# Patient Record
Sex: Male | Born: 1945
Health system: Southern US, Community
[De-identification: ages and names within clinical notes are randomized; demographics above are authoritative.]

## PROBLEM LIST (undated history)

## (undated) DIAGNOSIS — E785 Hyperlipidemia, unspecified: Secondary | ICD-10-CM

## (undated) DIAGNOSIS — D649 Anemia, unspecified: Secondary | ICD-10-CM

## (undated) DIAGNOSIS — R001 Bradycardia, unspecified: Secondary | ICD-10-CM

## (undated) DIAGNOSIS — M545 Low back pain, unspecified: Secondary | ICD-10-CM

## (undated) DIAGNOSIS — I639 Cerebral infarction, unspecified: Secondary | ICD-10-CM

## (undated) DIAGNOSIS — M999 Biomechanical lesion, unspecified: Secondary | ICD-10-CM

## (undated) DIAGNOSIS — E039 Hypothyroidism, unspecified: Secondary | ICD-10-CM

## (undated) DIAGNOSIS — T7840XA Allergy, unspecified, initial encounter: Secondary | ICD-10-CM

## (undated) DIAGNOSIS — K222 Esophageal obstruction: Secondary | ICD-10-CM

## (undated) DIAGNOSIS — I2510 Atherosclerotic heart disease of native coronary artery without angina pectoris: Secondary | ICD-10-CM

## (undated) DIAGNOSIS — R011 Cardiac murmur, unspecified: Secondary | ICD-10-CM

## (undated) DIAGNOSIS — M1990 Unspecified osteoarthritis, unspecified site: Secondary | ICD-10-CM

## (undated) DIAGNOSIS — Q2112 Patent foramen ovale: Secondary | ICD-10-CM

## (undated) DIAGNOSIS — Q211 Atrial septal defect: Secondary | ICD-10-CM

## (undated) DIAGNOSIS — I208 Other forms of angina pectoris: Secondary | ICD-10-CM

## (undated) DIAGNOSIS — Z9289 Personal history of other medical treatment: Secondary | ICD-10-CM

## (undated) DIAGNOSIS — J42 Unspecified chronic bronchitis: Secondary | ICD-10-CM

## (undated) DIAGNOSIS — G8929 Other chronic pain: Secondary | ICD-10-CM

## (undated) HISTORY — DX: Patent foramen ovale: Q21.12

## (undated) HISTORY — PX: TONSILLECTOMY: SUR1361

## (undated) HISTORY — DX: Bradycardia, unspecified: R00.1

## (undated) HISTORY — DX: Atrial septal defect: Q21.1

## (undated) HISTORY — DX: Cerebral infarction, unspecified: I63.9

## (undated) HISTORY — DX: Other forms of angina pectoris: I20.8

## (undated) HISTORY — DX: Personal history of other medical treatment: Z92.89

## (undated) HISTORY — DX: Hyperlipidemia, unspecified: E78.5

## (undated) HISTORY — DX: Atherosclerotic heart disease of native coronary artery without angina pectoris: I25.10

## (undated) HISTORY — DX: Allergy, unspecified, initial encounter: T78.40XA

## (undated) HISTORY — DX: Biomechanical lesion, unspecified: M99.9

## (undated) HISTORY — DX: Unspecified osteoarthritis, unspecified site: M19.90

---
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
## 2001-04-10 ENCOUNTER — Encounter (INDEPENDENT_AMBULATORY_CARE_PROVIDER_SITE_OTHER): Payer: Self-pay | Admitting: Specialist

## 2001-04-10 ENCOUNTER — Other Ambulatory Visit: Admission: RE | Admit: 2001-04-10 | Discharge: 2001-04-10 | Payer: Self-pay | Admitting: Internal Medicine

## 2004-12-06 ENCOUNTER — Ambulatory Visit: Payer: Self-pay | Admitting: Internal Medicine

## 2004-12-14 ENCOUNTER — Ambulatory Visit: Payer: Self-pay | Admitting: Internal Medicine

## 2005-02-20 ENCOUNTER — Ambulatory Visit: Payer: Self-pay | Admitting: Internal Medicine

## 2005-03-13 ENCOUNTER — Ambulatory Visit: Payer: Self-pay | Admitting: Internal Medicine

## 2005-10-05 ENCOUNTER — Ambulatory Visit: Payer: Self-pay | Admitting: Internal Medicine

## 2006-01-11 ENCOUNTER — Ambulatory Visit: Payer: Self-pay | Admitting: Internal Medicine

## 2006-01-21 ENCOUNTER — Ambulatory Visit: Payer: Self-pay | Admitting: Internal Medicine

## 2006-03-29 ENCOUNTER — Ambulatory Visit: Payer: Self-pay | Admitting: Internal Medicine

## 2006-04-04 ENCOUNTER — Ambulatory Visit: Payer: Self-pay | Admitting: Internal Medicine

## 2006-05-10 ENCOUNTER — Ambulatory Visit: Payer: Self-pay | Admitting: Internal Medicine

## 2006-07-30 ENCOUNTER — Ambulatory Visit: Payer: Self-pay | Admitting: Internal Medicine

## 2006-07-30 LAB — CONVERTED CEMR LAB
ALT: 23 U/L
AST: 26 U/L
Albumin: 3.6 g/dL
Alkaline Phosphatase: 44 U/L
BUN: 15 mg/dL
CO2: 28 meq/L
Calcium: 9.2 mg/dL
Chloride: 105 meq/L
Chol/HDL Ratio, serum: 5.5
Cholesterol: 255 mg/dL
Creatinine, Ser: 1.1 mg/dL
Free T4: 0.8 ng/dL — ABNORMAL LOW
GFR calc non Af Amer: 73 mL/min
Glomerular Filtration Rate, Af Am: 88 mL/min/{1.73_m2}
Glucose, Bld: 105 mg/dL — ABNORMAL HIGH
HDL: 46.1 mg/dL
LDL DIRECT: 120.3 mg/dL
Potassium: 3.9 meq/L
Sodium: 138 meq/L
TSH: 3.16 u[IU]/mL
Total Bilirubin: 0.9 mg/dL
Total Protein: 6.5 g/dL
Triglyceride fasting, serum: 228 mg/dL
VLDL: 46 mg/dL — ABNORMAL HIGH

## 2006-08-22 ENCOUNTER — Ambulatory Visit: Payer: Self-pay | Admitting: Internal Medicine

## 2006-12-25 ENCOUNTER — Ambulatory Visit: Payer: Self-pay | Admitting: Internal Medicine

## 2006-12-25 LAB — CONVERTED CEMR LAB
ALT: 21 units/L (ref 0–40)
AST: 22 units/L (ref 0–37)
Albumin: 3.7 g/dL (ref 3.5–5.2)
Alkaline Phosphatase: 46 units/L (ref 39–117)
BUN: 18 mg/dL (ref 6–23)
Bilirubin, Direct: 0.1 mg/dL (ref 0.0–0.3)
CO2: 30 meq/L (ref 19–32)
Calcium: 9.4 mg/dL (ref 8.4–10.5)
Chloride: 108 meq/L (ref 96–112)
Cholesterol: 242 mg/dL (ref 0–200)
Creatinine, Ser: 1 mg/dL (ref 0.4–1.5)
Direct LDL: 103 mg/dL
GFR calc Af Amer: 98 mL/min
GFR calc non Af Amer: 81 mL/min
Glucose, Bld: 109 mg/dL — ABNORMAL HIGH (ref 70–99)
HDL: 45.2 mg/dL (ref >39.0)
Potassium: 4.1 meq/L (ref 3.5–5.1)
Sodium: 141 meq/L (ref 135–145)
Total Bilirubin: 0.9 mg/dL (ref 0.3–1.2)
Total CHOL/HDL Ratio: 5.4
Total Protein: 6.6 g/dL (ref 6.0–8.3)
Triglycerides: 376 mg/dL (ref 0–149)
VLDL: 75 mg/dL — ABNORMAL HIGH (ref 0–40)

## 2006-12-26 ENCOUNTER — Ambulatory Visit: Payer: Self-pay | Admitting: Internal Medicine

## 2007-02-18 ENCOUNTER — Ambulatory Visit: Payer: Self-pay | Admitting: Internal Medicine

## 2007-03-31 ENCOUNTER — Ambulatory Visit: Payer: Self-pay | Admitting: Internal Medicine

## 2007-06-09 ENCOUNTER — Ambulatory Visit: Payer: Self-pay | Admitting: Internal Medicine

## 2007-07-07 ENCOUNTER — Ambulatory Visit: Payer: Self-pay | Admitting: Internal Medicine

## 2007-07-07 LAB — CONVERTED CEMR LAB
ALT: 21 units/L (ref 0–53)
AST: 22 units/L (ref 0–37)
Albumin: 3.8 g/dL (ref 3.5–5.2)
Alkaline Phosphatase: 36 units/L — ABNORMAL LOW (ref 39–117)
BUN: 18 mg/dL (ref 6–23)
Basophils Absolute: 0 10*3/uL (ref 0.0–0.1)
Basophils Relative: 0.6 % (ref 0.0–1.0)
Bilirubin, Direct: 0.1 mg/dL (ref 0.0–0.3)
CO2: 29 meq/L (ref 19–32)
Calcium: 9.1 mg/dL (ref 8.4–10.5)
Chloride: 109 meq/L (ref 96–112)
Cholesterol: 222 mg/dL (ref 0–200)
Creatinine, Ser: 0.9 mg/dL (ref 0.4–1.5)
Direct LDL: 122.9 mg/dL
Eosinophils Absolute: 0.1 10*3/uL (ref 0.0–0.6)
Eosinophils Relative: 2.6 % (ref 0.0–5.0)
GFR calc Af Amer: 110 mL/min
GFR calc non Af Amer: 91 mL/min
Glucose, Bld: 111 mg/dL — ABNORMAL HIGH (ref 70–99)
HCT: 35.9 % — ABNORMAL LOW (ref 39.0–52.0)
HDL: 43.6 mg/dL (ref >39.0)
Hemoglobin: 12.4 g/dL — ABNORMAL LOW (ref 13.0–17.0)
Hgb A1c MFr Bld: 5.6 % (ref 4.6–6.0)
Lymphocytes Relative: 32.1 % (ref 12.0–46.0)
MCHC: 34.5 g/dL (ref 30.0–36.0)
MCV: 96.2 fL (ref 78.0–100.0)
Monocytes Absolute: 0.4 10*3/uL (ref 0.2–0.7)
Monocytes Relative: 10 % (ref 3.0–11.0)
Neutro Abs: 2.4 10*3/uL (ref 1.4–7.7)
Neutrophils Relative %: 54.7 % (ref 43.0–77.0)
PSA: 3.13 ng/mL (ref 0.10–4.00)
Platelets: 180 10*3/uL (ref 150–400)
Potassium: 4.1 meq/L (ref 3.5–5.1)
RBC: 3.74 M/uL — ABNORMAL LOW (ref 4.22–5.81)
RDW: 12.3 % (ref 11.5–14.6)
Sodium: 141 meq/L (ref 135–145)
TSH: 1.75 microintl units/mL (ref 0.35–5.50)
Total Bilirubin: 0.7 mg/dL (ref 0.3–1.2)
Total CHOL/HDL Ratio: 5.1
Total Protein: 6.5 g/dL (ref 6.0–8.3)
Triglycerides: 181 mg/dL — ABNORMAL HIGH (ref 0–149)
VLDL: 36 mg/dL (ref 0–40)
Vit D, 1,25-Dihydroxy: 29 — ABNORMAL LOW (ref 30–89)
WBC: 4.3 10*3/uL — ABNORMAL LOW (ref 4.5–10.5)

## 2007-07-10 ENCOUNTER — Encounter: Payer: Self-pay | Admitting: Internal Medicine

## 2007-07-10 ENCOUNTER — Ambulatory Visit: Payer: Self-pay | Admitting: Internal Medicine

## 2007-07-10 DIAGNOSIS — E785 Hyperlipidemia, unspecified: Secondary | ICD-10-CM | POA: Insufficient documentation

## 2007-07-10 DIAGNOSIS — E039 Hypothyroidism, unspecified: Secondary | ICD-10-CM | POA: Insufficient documentation

## 2007-09-25 ENCOUNTER — Ambulatory Visit: Payer: Self-pay | Admitting: Internal Medicine

## 2007-09-25 DIAGNOSIS — J069 Acute upper respiratory infection, unspecified: Secondary | ICD-10-CM | POA: Insufficient documentation

## 2007-10-08 ENCOUNTER — Telehealth: Payer: Self-pay | Admitting: Internal Medicine

## 2007-11-04 ENCOUNTER — Ambulatory Visit: Payer: Self-pay | Admitting: Internal Medicine

## 2007-11-04 DIAGNOSIS — R7309 Other abnormal glucose: Secondary | ICD-10-CM | POA: Insufficient documentation

## 2007-11-04 DIAGNOSIS — E038 Other specified hypothyroidism: Secondary | ICD-10-CM | POA: Insufficient documentation

## 2007-11-05 LAB — CONVERTED CEMR LAB
ALT: 21 units/L (ref 0–53)
AST: 23 units/L (ref 0–37)
Albumin: 3.8 g/dL (ref 3.5–5.2)
Alkaline Phosphatase: 41 units/L (ref 39–117)
BUN: 22 mg/dL (ref 6–23)
Bilirubin, Direct: 0.1 mg/dL (ref 0.0–0.3)
CO2: 30 meq/L (ref 19–32)
Calcium: 9.3 mg/dL (ref 8.4–10.5)
Chloride: 105 meq/L (ref 96–112)
Creatinine, Ser: 1 mg/dL (ref 0.4–1.5)
GFR calc Af Amer: 98 mL/min
GFR calc non Af Amer: 81 mL/min
Glucose, Bld: 108 mg/dL — ABNORMAL HIGH (ref 70–99)
Hgb A1c MFr Bld: 5.9 % (ref 4.6–6.0)
PSA: 2.58 ng/mL (ref 0.10–4.00)
Potassium: 4.2 meq/L (ref 3.5–5.1)
Sodium: 140 meq/L (ref 135–145)
TSH: 2.45 microintl units/mL (ref 0.35–5.50)
Total Bilirubin: 0.9 mg/dL (ref 0.3–1.2)
Total Protein: 6.4 g/dL (ref 6.0–8.3)

## 2007-11-10 ENCOUNTER — Ambulatory Visit: Payer: Self-pay | Admitting: Internal Medicine

## 2008-04-02 ENCOUNTER — Ambulatory Visit: Payer: Self-pay | Admitting: Internal Medicine

## 2008-04-02 LAB — CONVERTED CEMR LAB
BUN: 20 mg/dL (ref 6–23)
CO2: 28 meq/L (ref 19–32)
Calcium: 9.1 mg/dL (ref 8.4–10.5)
Chloride: 109 meq/L (ref 96–112)
Cholesterol: 232 mg/dL (ref 0–200)
Creatinine, Ser: 1 mg/dL (ref 0.4–1.5)
Direct LDL: 127.5 mg/dL
GFR calc Af Amer: 98 mL/min
GFR calc non Af Amer: 81 mL/min
Glucose, Bld: 101 mg/dL — ABNORMAL HIGH (ref 70–99)
HDL: 40.9 mg/dL (ref >39.0)
Hgb A1c MFr Bld: 5.8 % (ref 4.6–6.0)
Potassium: 4.3 meq/L (ref 3.5–5.1)
Sodium: 141 meq/L (ref 135–145)
TSH: 2.58 microintl units/mL (ref 0.35–5.50)
Total CHOL/HDL Ratio: 5.7
Triglycerides: 230 mg/dL (ref 0–149)
VLDL: 46 mg/dL — ABNORMAL HIGH (ref 0–40)

## 2008-04-08 ENCOUNTER — Ambulatory Visit: Payer: Self-pay | Admitting: Internal Medicine

## 2008-08-09 ENCOUNTER — Telehealth: Payer: Self-pay | Admitting: Internal Medicine

## 2008-10-05 ENCOUNTER — Ambulatory Visit: Payer: Self-pay | Admitting: Internal Medicine

## 2008-10-05 LAB — CONVERTED CEMR LAB
ALT: 27 units/L (ref 0–53)
AST: 23 units/L (ref 0–37)
Albumin: 3.7 g/dL (ref 3.5–5.2)
Alkaline Phosphatase: 43 units/L (ref 39–117)
BUN: 17 mg/dL (ref 6–23)
Basophils Absolute: 0 10*3/uL (ref 0.0–0.1)
Basophils Relative: 0.3 % (ref 0.0–3.0)
Bilirubin Urine: NEGATIVE
Bilirubin, Direct: 0.1 mg/dL (ref 0.0–0.3)
CO2: 29 meq/L (ref 19–32)
Calcium: 9 mg/dL (ref 8.4–10.5)
Chloride: 107 meq/L (ref 96–112)
Cholesterol: 248 mg/dL (ref 0–200)
Creatinine, Ser: 1 mg/dL (ref 0.4–1.5)
Direct LDL: 162.2 mg/dL
Eosinophils Absolute: 0.1 10*3/uL (ref 0.0–0.7)
Eosinophils Relative: 2.5 % (ref 0.0–5.0)
GFR calc Af Amer: 97 mL/min
GFR calc non Af Amer: 80 mL/min
Glucose, Bld: 101 mg/dL — ABNORMAL HIGH (ref 70–99)
HCT: 37.7 % — ABNORMAL LOW (ref 39.0–52.0)
HDL: 51.3 mg/dL (ref >39.0)
Hemoglobin, Urine: NEGATIVE
Hemoglobin: 12.9 g/dL — ABNORMAL LOW (ref 13.0–17.0)
Ketones, ur: NEGATIVE mg/dL
Leukocytes, UA: NEGATIVE
Lymphocytes Relative: 23.4 % (ref 12.0–46.0)
MCHC: 34.2 g/dL (ref 30.0–36.0)
MCV: 97.1 fL (ref 78.0–100.0)
Monocytes Absolute: 0.5 10*3/uL (ref 0.1–1.0)
Monocytes Relative: 12.8 % — ABNORMAL HIGH (ref 3.0–12.0)
Neutro Abs: 2.6 10*3/uL (ref 1.4–7.7)
Neutrophils Relative %: 61 % (ref 43.0–77.0)
Nitrite: NEGATIVE
PSA: 2.61 ng/mL (ref 0.10–4.00)
Platelets: 144 10*3/uL — ABNORMAL LOW (ref 150–400)
Potassium: 4.2 meq/L (ref 3.5–5.1)
RBC: 3.88 M/uL — ABNORMAL LOW (ref 4.22–5.81)
RDW: 11.7 % (ref 11.5–14.6)
Sodium: 140 meq/L (ref 135–145)
Specific Gravity, Urine: 1.015 (ref 1.000–1.03)
TSH: 4.16 microintl units/mL (ref 0.35–5.50)
Total Bilirubin: 0.8 mg/dL (ref 0.3–1.2)
Total CHOL/HDL Ratio: 4.8
Total Protein, Urine: NEGATIVE mg/dL
Total Protein: 6.3 g/dL (ref 6.0–8.3)
Triglycerides: 96 mg/dL (ref 0–149)
Urine Glucose: NEGATIVE mg/dL
Urobilinogen, UA: 0.2 (ref 0.0–1.0)
VLDL: 19 mg/dL (ref 0–40)
WBC: 4.2 10*3/uL — ABNORMAL LOW (ref 4.5–10.5)
pH: 6 (ref 5.0–8.0)

## 2008-10-07 ENCOUNTER — Ambulatory Visit: Payer: Self-pay | Admitting: Internal Medicine

## 2008-10-07 ENCOUNTER — Telehealth: Payer: Self-pay | Admitting: Internal Medicine

## 2008-10-07 DIAGNOSIS — R799 Abnormal finding of blood chemistry, unspecified: Secondary | ICD-10-CM | POA: Insufficient documentation

## 2008-10-07 DIAGNOSIS — R21 Rash and other nonspecific skin eruption: Secondary | ICD-10-CM | POA: Insufficient documentation

## 2008-10-15 ENCOUNTER — Ambulatory Visit: Payer: Self-pay | Admitting: Internal Medicine

## 2008-10-15 DIAGNOSIS — R059 Cough, unspecified: Secondary | ICD-10-CM | POA: Insufficient documentation

## 2008-10-15 DIAGNOSIS — R05 Cough: Secondary | ICD-10-CM

## 2008-10-15 DIAGNOSIS — R053 Chronic cough: Secondary | ICD-10-CM | POA: Insufficient documentation

## 2008-10-15 DIAGNOSIS — J209 Acute bronchitis, unspecified: Secondary | ICD-10-CM | POA: Insufficient documentation

## 2008-12-20 ENCOUNTER — Ambulatory Visit: Payer: Self-pay | Admitting: Internal Medicine

## 2008-12-20 LAB — CONVERTED CEMR LAB
ALT: 29 units/L (ref 0–53)
AST: 33 units/L (ref 0–37)
Albumin: 3.9 g/dL (ref 3.5–5.2)
Alkaline Phosphatase: 40 units/L (ref 39–117)
BUN: 20 mg/dL (ref 6–23)
Bilirubin, Direct: 0.1 mg/dL (ref 0.0–0.3)
CO2: 29 meq/L (ref 19–32)
Calcium: 8.8 mg/dL (ref 8.4–10.5)
Chloride: 108 meq/L (ref 96–112)
Creatinine, Ser: 1 mg/dL (ref 0.4–1.5)
GFR calc non Af Amer: 80.29 mL/min (ref >60)
Glucose, Bld: 95 mg/dL (ref 70–99)
Potassium: 4.1 meq/L (ref 3.5–5.1)
Sodium: 141 meq/L (ref 135–145)
TSH: 2.55 microintl units/mL (ref 0.35–5.50)
Total Bilirubin: 0.8 mg/dL (ref 0.3–1.2)
Total Protein: 6.2 g/dL (ref 6.0–8.3)

## 2008-12-23 ENCOUNTER — Ambulatory Visit: Payer: Self-pay | Admitting: Internal Medicine

## 2008-12-23 DIAGNOSIS — M79609 Pain in unspecified limb: Secondary | ICD-10-CM | POA: Insufficient documentation

## 2008-12-23 DIAGNOSIS — R5383 Other fatigue: Secondary | ICD-10-CM | POA: Insufficient documentation

## 2009-06-22 ENCOUNTER — Ambulatory Visit: Payer: Self-pay | Admitting: Internal Medicine

## 2009-06-22 LAB — CONVERTED CEMR LAB
ALT: 22 units/L (ref 0–53)
AST: 24 units/L (ref 0–37)
Albumin: 4 g/dL (ref 3.5–5.2)
Alkaline Phosphatase: 41 units/L (ref 39–117)
BUN: 21 mg/dL (ref 6–23)
Basophils Absolute: 0 10*3/uL (ref 0.0–0.1)
Basophils Relative: 0.8 % (ref 0.0–3.0)
Bilirubin, Direct: 0.1 mg/dL (ref 0.0–0.3)
CO2: 29 meq/L (ref 19–32)
Calcium: 9.4 mg/dL (ref 8.4–10.5)
Chloride: 107 meq/L (ref 96–112)
Cholesterol: 262 mg/dL — ABNORMAL HIGH (ref 0–200)
Creatinine, Ser: 1.1 mg/dL (ref 0.4–1.5)
Direct LDL: 168.5 mg/dL
Eosinophils Absolute: 0.1 10*3/uL (ref 0.0–0.7)
Eosinophils Relative: 2.2 % (ref 0.0–5.0)
GFR calc non Af Amer: 71.81 mL/min (ref >60)
Glucose, Bld: 92 mg/dL (ref 70–99)
HCT: 38.9 % — ABNORMAL LOW (ref 39.0–52.0)
HDL: 46.1 mg/dL (ref >39.00)
Hemoglobin: 13.2 g/dL (ref 13.0–17.0)
Lymphocytes Relative: 34.6 % (ref 12.0–46.0)
Lymphs Abs: 1.6 10*3/uL (ref 0.7–4.0)
MCHC: 33.8 g/dL (ref 30.0–36.0)
MCV: 97.8 fL (ref 78.0–100.0)
Monocytes Absolute: 0.5 10*3/uL (ref 0.1–1.0)
Monocytes Relative: 10 % (ref 3.0–12.0)
Neutro Abs: 2.4 10*3/uL (ref 1.4–7.7)
Neutrophils Relative %: 52.4 % (ref 43.0–77.0)
Platelets: 165 10*3/uL (ref 150.0–400.0)
Potassium: 4.2 meq/L (ref 3.5–5.1)
RBC: 3.98 M/uL — ABNORMAL LOW (ref 4.22–5.81)
RDW: 11.9 % (ref 11.5–14.6)
Sodium: 140 meq/L (ref 135–145)
TSH: 2.24 microintl units/mL (ref 0.35–5.50)
Total Bilirubin: 1.1 mg/dL (ref 0.3–1.2)
Total CHOL/HDL Ratio: 6
Total Protein: 6.8 g/dL (ref 6.0–8.3)
Triglycerides: 163 mg/dL — ABNORMAL HIGH (ref 0.0–149.0)
VLDL: 32.6 mg/dL (ref 0.0–40.0)
WBC: 4.6 10*3/uL (ref 4.5–10.5)

## 2009-06-27 ENCOUNTER — Encounter: Payer: Self-pay | Admitting: Internal Medicine

## 2009-06-28 ENCOUNTER — Ambulatory Visit: Payer: Self-pay | Admitting: Internal Medicine

## 2009-09-07 ENCOUNTER — Ambulatory Visit: Payer: Self-pay | Admitting: Internal Medicine

## 2009-09-26 ENCOUNTER — Ambulatory Visit (HOSPITAL_COMMUNITY): Admission: RE | Admit: 2009-09-26 | Discharge: 2009-09-26 | Payer: Self-pay | Admitting: Internal Medicine

## 2009-10-03 ENCOUNTER — Encounter: Payer: Self-pay | Admitting: Internal Medicine

## 2009-12-27 ENCOUNTER — Ambulatory Visit: Payer: Self-pay | Admitting: Internal Medicine

## 2009-12-27 LAB — CONVERTED CEMR LAB
ALT: 31 units/L (ref 0–53)
AST: 35 units/L (ref 0–37)
Albumin: 3.9 g/dL (ref 3.5–5.2)
Alkaline Phosphatase: 41 units/L (ref 39–117)
BUN: 20 mg/dL (ref 6–23)
Basophils Absolute: 0 10*3/uL (ref 0.0–0.1)
Basophils Relative: 0.5 % (ref 0.0–3.0)
Bilirubin Urine: NEGATIVE
Bilirubin, Direct: 0.1 mg/dL (ref 0.0–0.3)
CO2: 30 meq/L (ref 19–32)
Calcium: 9.2 mg/dL (ref 8.4–10.5)
Chloride: 103 meq/L (ref 96–112)
Cholesterol: 287 mg/dL — ABNORMAL HIGH (ref 0–200)
Creatinine, Ser: 1 mg/dL (ref 0.4–1.5)
Direct LDL: 149.5 mg/dL
Eosinophils Absolute: 0.1 10*3/uL (ref 0.0–0.7)
Eosinophils Relative: 1.6 % (ref 0.0–5.0)
GFR calc non Af Amer: 80.03 mL/min (ref >60)
Glucose, Bld: 89 mg/dL (ref 70–99)
HCT: 38.6 % — ABNORMAL LOW (ref 39.0–52.0)
HDL: 53 mg/dL (ref >39.00)
Hemoglobin, Urine: NEGATIVE
Hemoglobin: 13.4 g/dL (ref 13.0–17.0)
Ketones, ur: NEGATIVE mg/dL
Leukocytes, UA: NEGATIVE
Lymphocytes Relative: 32.7 % (ref 12.0–46.0)
Lymphs Abs: 1.5 10*3/uL (ref 0.7–4.0)
MCHC: 34.7 g/dL (ref 30.0–36.0)
MCV: 96.1 fL (ref 78.0–100.0)
Monocytes Absolute: 0.4 10*3/uL (ref 0.1–1.0)
Monocytes Relative: 8.7 % (ref 3.0–12.0)
Neutro Abs: 2.6 10*3/uL (ref 1.4–7.7)
Neutrophils Relative %: 56.5 % (ref 43.0–77.0)
Nitrite: NEGATIVE
PSA: 2.68 ng/mL (ref 0.10–4.00)
Platelets: 176 10*3/uL (ref 150.0–400.0)
Potassium: 3.6 meq/L (ref 3.5–5.1)
RBC: 4.01 M/uL — ABNORMAL LOW (ref 4.22–5.81)
RDW: 12.3 % (ref 11.5–14.6)
Sodium: 140 meq/L (ref 135–145)
Specific Gravity, Urine: 1.02 (ref 1.000–1.030)
TSH: 2.34 microintl units/mL (ref 0.35–5.50)
Total Bilirubin: 0.8 mg/dL (ref 0.3–1.2)
Total CHOL/HDL Ratio: 5
Total Protein, Urine: NEGATIVE mg/dL
Total Protein: 6.7 g/dL (ref 6.0–8.3)
Triglycerides: 256 mg/dL — ABNORMAL HIGH (ref 0.0–149.0)
Urine Glucose: NEGATIVE mg/dL
Urobilinogen, UA: 0.2 (ref 0.0–1.0)
VLDL: 51.2 mg/dL — ABNORMAL HIGH (ref 0.0–40.0)
WBC: 4.6 10*3/uL (ref 4.5–10.5)
pH: 5 (ref 5.0–8.0)

## 2009-12-30 ENCOUNTER — Ambulatory Visit: Payer: Self-pay | Admitting: Internal Medicine

## 2010-01-31 ENCOUNTER — Encounter: Payer: Self-pay | Admitting: Internal Medicine

## 2010-03-29 ENCOUNTER — Telehealth: Payer: Self-pay | Admitting: Internal Medicine

## 2010-04-20 ENCOUNTER — Ambulatory Visit: Payer: Self-pay | Admitting: Internal Medicine

## 2010-04-20 LAB — CONVERTED CEMR LAB
ALT: 22 units/L (ref 0–53)
AST: 24 units/L (ref 0–37)
Albumin: 4 g/dL (ref 3.5–5.2)
Alkaline Phosphatase: 37 units/L — ABNORMAL LOW (ref 39–117)
BUN: 18 mg/dL (ref 6–23)
Bilirubin, Direct: 0.1 mg/dL (ref 0.0–0.3)
CO2: 28 meq/L (ref 19–32)
Calcium: 9.3 mg/dL (ref 8.4–10.5)
Chloride: 105 meq/L (ref 96–112)
Cholesterol: 244 mg/dL — ABNORMAL HIGH (ref 0–200)
Creatinine, Ser: 1 mg/dL (ref 0.4–1.5)
Direct LDL: 142.5 mg/dL
GFR calc non Af Amer: 79.95 mL/min (ref >60)
Glucose, Bld: 87 mg/dL (ref 70–99)
HDL: 51.7 mg/dL (ref >39.00)
IgE (Immunoglobulin E), Serum: 14.5 intl units/mL (ref 0.0–180.0)
Potassium: 4.3 meq/L (ref 3.5–5.1)
Sodium: 140 meq/L (ref 135–145)
Total Bilirubin: 0.9 mg/dL (ref 0.3–1.2)
Total CHOL/HDL Ratio: 5
Total CK: 99 units/L (ref 7–232)
Total Protein: 6.4 g/dL (ref 6.0–8.3)
Triglycerides: 190 mg/dL — ABNORMAL HIGH (ref 0.0–149.0)
VLDL: 38 mg/dL (ref 0.0–40.0)

## 2010-04-25 ENCOUNTER — Ambulatory Visit: Payer: Self-pay | Admitting: Internal Medicine

## 2010-10-17 NOTE — Assessment & Plan Note (Addendum)
Summary: 4 MONTH F/U   //JT   Vital Signs:  Patient Profile:   65 Years Old Male Weight:      193 pounds Temp:     98 degrees F oral Pulse rate:   53 / minute BP sitting:   115 / 70  (left arm)  Vitals Entered By: Tora Perches (November 10, 2007 9:11 AM)             Is Patient Diabetic? No     Chief Complaint:  Multiple medical problems or concerns.  History of Present Illness: The patient presents for a follow up of hypertension, fatigue, hyperlipidemia     Current Allergies: LIPITOR  Past Medical History:    Reviewed history from 07/10/2007 and no changes required:       Hyperlipidemia       Hypothyroidism       W. Nile virus 2008   Family History:    Reviewed history from 07/10/2007 and no changes required:       Family History of CAD Male 1st degree relative <60  Social History:    Reviewed history from 07/10/2007 and no changes required:       Retired       Married       Never Smoked       Alcohol use-no       Regular exercise-yes    Review of Systems  The patient denies anorexia, fever, chest pain, syncope, hemoptysis, and severe indigestion/heartburn.     Physical Exam  General:     Well-developed,well-nourished,in no acute distress; alert,appropriate and cooperative throughout examination Eyes:     No corneal or conjunctival inflammation noted. EOMI. Perrla. Funduscopic exam benign, without hemorrhages, exudates or papilledema. Vision grossly normal. Ears:     External ear exam shows no significant lesions or deformities.  Otoscopic examination reveals clear canals, tympanic membranes are intact bilaterally without bulging, retraction, inflammation or discharge. Hearing is grossly normal bilaterally. Nose:     External nasal examination shows no deformity or inflammation. Nasal mucosa are pink and moist without lesions or exudates. Mouth:     Oral mucosa and oropharynx without lesions or exudates.  Teeth in good repair. Neck:     No  deformities, masses, or tenderness noted. Lungs:     Normal respiratory effort, chest expands symmetrically. Lungs are clear to auscultation, no crackles or wheezes. Heart:     Normal rate and regular rhythm. S1 and S2 normal without gallop, murmur, click, rub or other extra sounds. Abdomen:     Bowel sounds positive,abdomen soft and non-tender without masses, organomegaly or hernias noted. Msk:     No deformity or scoliosis noted of thoracic or lumbar spine.   Pulses:     R and L carotid,radial,femoral,dorsalis pedis and posterior tibial pulses are full and equal bilaterally Extremities:     No clubbing, cyanosis, edema, or deformity noted with normal full range of motion of all joints.   Neurologic:     No cranial nerve deficits noted. Station and gait are normal. Plantar reflexes are down-going bilaterally. DTRs are symmetrical throughout. Sensory, motor and coordinative functions appear intact. Skin:     Intact without suspicious lesions or rashes Psych:     Cognition and judgment appear intact. Alert and cooperative with normal attention span and concentration. No apparent delusions, illusions, hallucinations    Impression & Recommendations:  Problem # 1:  HYPOTHYROIDISM (ICD-244.9) Assessment: Improved  His updated medication list for this problem includes:  Synthroid 25 Mcg Tabs (Levothyroxine sodium) ..... Qd   Problem # 2:  ELEVATED PROSTATE SPECIFIC ANTIGEN (ICD-790.93) Assessment: Improved Went down, was 3.13 in Oct 2008  Problem # 3:  HYPERLIPIDEMIA (ICD-272.4) Assessment: Unchanged  His updated medication list for this problem includes:    Gnp Niacin Tr 500 Mg Tbcr (Niacin) ..... Qd   Problem # 4:  OTHER ABNORMAL GLUCOSE (ICD-790.29) Assessment: Unchanged Recheck later  Complete Medication List: 1)  Gnp Niacin Tr 500 Mg Tbcr (Niacin) .... Qd 2)  Fish Oil Oil (Fish oil) .... 6 per day 3)  Aspir-low 81 Mg Tbec (Aspirin) .[name].. 1 once daily pc 4)  Vitamin  D3 1000 Unit Tabs (Cholecalciferol) .[name].. 1 qd 5)  Synthroid 25 Mcg Tabs (Levothyroxine sodium) .... Qd   Patient Instructions: 1)  Please schedule a follow-up appointment in 4-5 months. 2)  BMP prior to visit, ICD-9: 3)  Lipid Panel prior to visit, ICD-9: 4)  TSH prior to visit, ICD-9:  244.8 995.2  596.0 790.29 5)  HbgA1C prior to visit, ICD-9:    ]

## 2010-10-17 NOTE — Assessment & Plan Note (Addendum)
Summary: CPX / NWS #   Vital Signs:  Patient profile:   65 year old male Height:      73 inches Weight:      183 pounds BMI:     24.23 O2 Sat:      97 % on Room air Temp:     97.5 degrees F oral Pulse rate:   43 / minute BP sitting:   110 / 60  (left arm) Cuff size:   regular  Vitals Entered By: Lucious Groves (December 30, 2009 8:46 AM)  O2 Flow:  Room air CC: CPX--c/o cold/cohgh x2 mo./kb Is Patient Diabetic? No Pain Assessment Patient in pain? no        Primary Care Provider:  Tresa Garter MD  CC:  CPX--c/o cold/cohgh x2 mo./kb.  History of Present Illness: The patient presents for a wellness examination  C/o rash on B palms x 12 months - then it stopped x 1 months   Current Medications (verified): 1)  Fish Oil   Oil (Fish Oil) .... 4 Per Day 2)  Vitamin D3 2000 Unit Caps (Cholecalciferol) .[name].. 1 By Mouth Qd 3)  Synthroid 25 Mcg Tabs (Levothyroxine Sodium) .... Qd 4)  Viagra 100 Mg Tabs (Sildenafil Citrate) .... Use Prn 5)  Gnp Niacin Tr 500 Mg  Tbcr (Niacin) .[name].. 1 Po Qod 6)  Triamcinolone 0.5% Cream in Eucerin Lotion 1:10 .... Use Two Times A Day Prn 7)  Ibuprofen 800 Mg Tabs (Ibuprofen) .... Three Times A Day Prn  Allergies (verified): 1)  Lipitor  Past History:  Past Surgical History: Last updated: 07/10/2007 Denies surgical history Tonsillectomy  Family History: Last updated: 07/10/2007 Family History of CAD Male 1st degree relative <60  Past Medical History: Hyperlipidemia Hypothyroidism W. Nile virus 2008 Asthmatic bronchitis Hand eczema Myalgias from Lipitor  Social History: Retired Married w/1 daughter Never Smoked Alcohol use-no Regular exercise-yes, bycicling and running  Review of Systems  The patient denies anorexia, fever, weight loss, weight gain, vision loss, decreased hearing, hoarseness, chest pain, syncope, dyspnea on exertion, peripheral edema, prolonged cough, headaches, hemoptysis, abdominal pain, melena,  hematochezia, severe indigestion/heartburn, hematuria, incontinence, genital sores, muscle weakness, suspicious skin lesions, transient blindness, difficulty walking, depression, unusual weight change, abnormal bleeding, enlarged lymph nodes, angioedema, and testicular masses.    Physical Exam  General:  alert, well-developed, well-nourished, and cooperative to examination.    Eyes:  dry scaly skin around eyes Nose:  External nasal examination shows no deformity or inflammation. Nasal mucosa are pink and moist without lesions or exudates. Mouth:  Erythematous throat mucosa and intranasal erythema.  Neck:  No deformities, masses, or tenderness noted. Lungs:  Normal respiratory effort, chest expands symmetrically. Lungs are clear to auscultation, no crackles or wheezes. Heart:  Normal rate and regular rhythm. S1 and S2 normal without gallop, murmur, click, rub or other extra sounds. Abdomen:  Bowel sounds positive,abdomen soft and non-tender without masses, organomegaly or hernias noted. Rectal:  No external abnormalities noted. Normal sphincter tone. No rectal masses or tenderness. Genitalia:  Testes bilaterally descended without nodularity, tenderness or masses. No scrotal masses or lesions. No penis lesions or urethral discharge. Prostate:  1+ enlarged.   Msk:  back: full range of motion of lumbar spine. Nontender to palpation.+ ipsilateral left side straight leg raise. Deep tendon reflexes symmetrically intact at Achilles and patella, negative clonus. Sensation intact throughout all dermatomes in bilateral lower extremities. Full strength to manual muscle testing in all major muscule groups including EHL, anterior tibialis,  gastrocnemius, quadriceps, and iliopsoas (but gaurds against pain with exertion of left ham). Able to heel and toe walk without difficulty and ambulates with a normal gait.  Pulses:  R and L carotid,radial,femoral,dorsalis pedis and posterior tibial pulses are full and equal  bilaterally Extremities:  No clubbing, cyanosis, edema, or deformity noted with normal full range of motion of all joints.   Neurologic:  No cranial nerve deficits noted. Station and gait are normal. Plantar reflexes are down-going bilaterally. DTRs are symmetrical throughout. Sensory, motor and coordinative functions appear intact. Skin:  B dry erythematous patches on palms 1 cm pimple on R buttock Cervical Nodes:  No lymphadenopathy noted Inguinal Nodes:  No significant adenopathy Psych:  Cognition and judgment appear intact. Alert and cooperative with normal attention span and concentration. No apparent delusions, illusions, hallucinations   Impression & Recommendations:  Problem # 1:  WELL ADULT EXAM (ICD-V70.0) Assessment New Health and age related issues were discussed. Available screening tests and vaccinations were discussed as well. Healthy life style including good diet and execise was discussed.  The labs were reviewed with the patient.   Problem # 2:  Pimple R buttock Assessment: New Doxy if not better  Problem # 3:  FATIGUE (ICD-780.79) Assessment: Improved  Problem # 4:  CBC, ABNORMAL (ICD-790.99) Assessment: Improved  Problem # 5:  RASH AND OTHER NONSPECIFIC SKIN ERUPTION (ICD-782.1) - eczema on palms Assessment: New  His updated medication list for this problem includes:    Triamcinolone Acetonide 0.5 % Crea (Triamcinolone acetonide) ..... Use two times a day prn  Orders: Depo- Medrol 80mg  (J1040) Admin of Therapeutic Inj  intramuscular or subcutaneous (16109)  Problem # 6:  ELEVATED PROSTATE SPECIFIC ANTIGEN (ICD-790.93) Assessment: Unchanged The labs were reviewed with the patient.   Problem # 7:  HYPERLIPIDEMIA (ICD-272.4)  His updated medication list for this problem includes:    Gnp Niacin Tr 500 Mg Tbcr (Niacin) .[name]... 1 po qod    Crestor 10 Mg Tabs (Rosuvastatin calcium) .[name]... 1 by mouth once daily for cholesterol - a trial  Complete Medication  List: 1)  Fish Oil Oil (Fish oil) .... 4 per day 2)  Vitamin D3 2000 Unit Caps (Cholecalciferol) .[name].. 1 by mouth qd 3)  Synthroid 25 Mcg Tabs (Levothyroxine sodium) .... Qd 4)  Viagra 100 Mg Tabs (Sildenafil citrate) .... Use prn 5)  Gnp Niacin Tr 500 Mg Tbcr (Niacin) .[name].. 1 po qod 6)  Triamcinolone 0.5% Cream in Eucerin Lotion 1:10  .... Use two times a day prn 7)  Ibuprofen 800 Mg Tabs (Ibuprofen) .... Three times a day prn 8)  Triamcinolone Acetonide 0.5 % Crea (Triamcinolone acetonide) .... Use two times a day prn 9)  Crestor 10 Mg Tabs (Rosuvastatin calcium) .[name].. 1 by mouth once daily for cholesterol 10)  Doxycycline Hyclate 100 Mg Caps (Doxycycline hyclate) .[name].. 1 by mouth two times a day with a glass of water  Other Orders: EKG w/ Interpretation (93000)  Patient Instructions: 1)  Please schedule a follow-up appointment in 3 months. 2)  BMP prior to visit, ICD-9: 3)  Hepatic Panel prior to visit, ICD-9: 4)  Lipid Panel prior to visit, ICD-9: 272.20 5)  CK 6)  Food allergy panel Prescriptions: DOXYCYCLINE HYCLATE 100 MG CAPS (DOXYCYCLINE HYCLATE) 1 by mouth two times a day with a glass of water  #20 x 0   Entered and Authorized by:   Tresa Garter MD   Signed by:   Tresa Garter MD on 12/30/2009  Method used:   Print then Give to Patient   RxID:   [id number] CRESTOR 10 MG TABS (ROSUVASTATIN CALCIUM) 1 by mouth once daily for cholesterol  #30 x 12   Entered and Authorized by:   Tresa Garter MD   Signed by:   Tresa Garter MD on 12/30/2009   Method used:   Print then Give to Patient   RxID:   [id number] TRIAMCINOLONE ACETONIDE 0.5 % CREA (TRIAMCINOLONE ACETONIDE) use two times a day prn  #120 g x 3   Entered and Authorized by:   Tresa Garter MD   Signed by:   Tresa Garter MD on 12/30/2009   Method used:   [facility]ronically to        [facility]       [address]       [place], [place]  [address]       Ph: [phone]       Fax: [phone]   RxID:   [id number]    Medication Administration  Injection # 1:    Medication: Depo- Medrol 80mg     Diagnosis: RASH AND OTHER NONSPECIFIC SKIN ERUPTION (ICD-782.1)    Route: IM    Site: RUOQ gluteus    Exp Date: 07/18/2012    Lot #: [id number]    Mfr: [employer]    Patient tolerated injection without complications    Given by: Lucious Groves (December 30, 2009 9:46 AM)  Injection # 2:    Medication: Depo- Medrol 40mg     Diagnosis: RASH AND OTHER NONSPECIFIC SKIN ERUPTION (ICD-782.1)    Route: IM    Site: RUOQ gluteus    Exp Date: 07/18/2012    Lot #: [id number]    Mfr: [employer]  Orders Added: 1)  EKG w/ Interpretation [93000] 2)  Est. Patient age 75-64 [2] 3)  Depo- Medrol 80mg  [J1040] 4)  Admin of Therapeutic Inj  intramuscular or subcutaneous [56433]

## 2010-10-17 NOTE — Assessment & Plan Note (Addendum)
Summary: FU [facility]  #   Vital Signs:  Patient profile:   65 year old male Height:      73 inches Weight:      186 pounds BMI:     24.63 O2 Sat:      97 % on Room air Temp:     98.3 degrees F oral Pulse rate:   53 / minute Pulse rhythm:   regular Resp:     16 per minute BP sitting:   110 / 60  (left arm) Cuff size:   regular  Vitals Entered By: Lanier Prude, CMA(AAMA) (April 25, 2010 8:33 AM)  O2 Flow:  Room air CC: f/u Is Patient Diabetic? No   Primary Care Provider:  Tresa Garter MD  CC:  f/u.  History of Present Illness: The patient presents for a follow up of  hyperlipidemia, myalgia, LBP, rash on hands   Current Medications (verified): 1)  Fish Oil   Oil (Fish Oil) .... 4 Per Day 2)  Vitamin D3 2000 Unit Caps (Cholecalciferol) .[name].. 1 By Mouth Qd 3)  Synthroid 25 Mcg Tabs (Levothyroxine Sodium) .... Qd 4)  Viagra 100 Mg Tabs (Sildenafil Citrate) .... Use Prn 5)  Gnp Niacin Tr 500 Mg  Tbcr (Niacin) .[name].. 1 Po Qod 6)  Triamcinolone 0.5% Cream in Eucerin Lotion 1:10 .... Use Two Times A Day Prn 7)  Ibuprofen 800 Mg Tabs (Ibuprofen) .... Three Times A Day Prn 8)  Triamcinolone Acetonide 0.5 % Crea (Triamcinolone Acetonide) .... Use Two Times A Day Prn 9)  Crestor 10 Mg Tabs (Rosuvastatin Calcium) .[name].. 1 By Mouth Once Daily For Cholesterol  Allergies (verified): 1)  Lipitor  Past History:  Past Medical History: Last updated: 12/30/2009 Hyperlipidemia Hypothyroidism W. Nile virus 2008 Asthmatic bronchitis Hand eczema Myalgias from Lipitor  Past Surgical History: Last updated: 07/10/2007 Denies surgical history Tonsillectomy  Family History: Last updated: 07/10/2007 Family History of CAD Male 1st degree relative <60  Social History: Last updated: 12/30/2009 Retired Married w/1 daughter Never Smoked Alcohol use-no Regular exercise-yes, bycicling and running  Social History: Reviewed history from 12/30/2009 and no changes  required. Retired Married w/1 daughter Never Smoked Alcohol use-no Regular exercise-yes, bycicling and running  Review of Systems  The patient denies fever, chest pain, and dyspnea on exertion.         LBP, hip pains  Physical Exam  General:  alert, well-developed, well-nourished, and cooperative to examination.    Eyes:  dry scaly skin around eyes Nose:  External nasal examination shows no deformity or inflammation. Nasal mucosa are pink and moist without lesions or exudates. Mouth:  Erythematous throat mucosa and intranasal erythema.  Neck:  No deformities, masses, or tenderness noted. Lungs:  Normal respiratory effort, chest expands symmetrically. Lungs are clear to auscultation, no crackles or wheezes. Heart:  Normal rate and regular rhythm. S1 and S2 normal without gallop, murmur, click, rub or other extra sounds. Abdomen:  Bowel sounds positive,abdomen soft and non-tender without masses, organomegaly or hernias noted. Msk:  Lumbar-sacral spine is tender to palpation over paraspinal muscles and painfull with the ROM in L groin, R piriformis, R IT band ROM low in LS spine - stiff Neurologic:  No cranial nerve deficits noted. Station and gait are normal. Plantar reflexes are down-going bilaterally. DTRs are symmetrical throughout. Sensory, motor and coordinative functions appear intact. Skin:  B dry erythematous patches on palms 1 cm pimple on R buttock Psych:  Cognition and judgment appear intact. Alert and  cooperative with normal attention span and concentration. No apparent delusions, illusions, hallucinations   Impression & Recommendations:  Problem # 1:  HYPERLIPIDEMIA (ICD-272.4) Assessment Improved The labs were reviewed with the patient.  His updated medication list for this problem includes:    Gnp Niacin Tr 500 Mg Tbcr (Niacin) .[name]... 1 po qod    Crestor 10 Mg Tabs (Rosuvastatin calcium) .[name]... 1 by mouth once daily for cholesterol  Problem # 2:  FATIGUE  (ICD-780.79) Assessment: Improved  Problem # 3:  HYPOTHYROIDISM (ICD-244.9) Assessment: Unchanged  His updated medication list for this problem includes:    Synthroid 25 Mcg Tabs (Levothyroxine sodium) ..... Qd  Problem # 4:  RASH AND OTHER NONSPECIFIC SKIN ERUPTION (ICD-782.1) hand eczema Assessment: Improved The labs were reviewed with the patient.  His updated medication list for this problem includes:    Triamcinolone Acetonide 0.5 % Crea (Triamcinolone acetonide) ..... Use two times a day prn  Problem # 5:  ELEVATED PROSTATE SPECIFIC ANTIGEN (ICD-790.93) Assessment: Improved  Complete Medication List: 1)  Fish Oil Oil (Fish oil) .... 4 per day 2)  Vitamin D3 2000 Unit Caps (Cholecalciferol) .[name].. 1 by mouth qd 3)  Synthroid 25 Mcg Tabs (Levothyroxine sodium) .... Qd 4)  Viagra 100 Mg Tabs (Sildenafil citrate) .... Use prn 5)  Gnp Niacin Tr 500 Mg Tbcr (Niacin) .[name].. 1 po qod 6)  Triamcinolone 0.5% Cream in Eucerin Lotion 1:10  .... Use two times a day prn 7)  Ibuprofen 800 Mg Tabs (Ibuprofen) .... Three times a day prn 8)  Triamcinolone Acetonide 0.5 % Crea (Triamcinolone acetonide) .... Use two times a day prn 9)  Crestor 10 Mg Tabs (Rosuvastatin calcium) .[name].. 1 by mouth once daily for cholesterol  Patient Instructions: 1)  Please schedule a follow-up appointment in 6 months well w/labs. 2)  Use stretching and balance exercises that I have provided (15 min. or longer every day)

## 2010-10-17 NOTE — Assessment & Plan Note (Addendum)
Summary: CPX/$50 /[facility]   Vital Signs:  Patient Profile:   65 Years Old Male Weight:      182 pounds Temp:     97.8 degrees F oral Pulse rate:   68 / minute BP sitting:   104 / 70  (left arm)  Vitals Entered By: Tora Perches (October 07, 2008 8:32 AM)                 Chief Complaint:  Preventive Care.  History of Present Illness: The patient presents for a wellness examination The patient presents with complaints of sore throat, fever, cough, sinus congestion and drainge of several days duration. Not better with OTC meds. Chest hurts with coughing. Can't sleep due to cough. Muscle aches are present.  The mucus is colored.     Prior Medications Reviewed Using: Patient Recall  Updated Prior Medication List: GNP NIACIN TR 500 MG  TBCR (NIACIN) qd FISH OIL   OIL (FISH OIL) 4 per day ASPIR-LOW 81 MG TBEC (ASPIRIN) 1 once daily pc VITAMIN D3 1000 UNIT  TABS (CHOLECALCIFEROL) 1 qd SYNTHROID 25 MCG TABS (LEVOTHYROXINE SODIUM) qd VIAGRA 100 MG TABS (SILDENAFIL CITRATE) use prn  Current Allergies (reviewed today): LIPITOR  Past Medical History:    Reviewed history from 07/10/2007 and no changes required:       Hyperlipidemia       Hypothyroidism       W. Nile virus 2008   Family History:    Reviewed history from 07/10/2007 and no changes required:       Family History of CAD Male 1st degree relative <60  Social History:    Reviewed history from 07/10/2007 and no changes required:       Retired       Married       Never Smoked       Alcohol use-no       Regular exercise-yes    Review of Systems       The patient complains of prolonged cough.  The patient denies anorexia, fever, weight loss, weight gain, vision loss, decreased hearing, hoarseness, chest pain, syncope, dyspnea on exertion, peripheral edema, headaches, hemoptysis, abdominal pain, melena, hematochezia, severe indigestion/heartburn, hematuria, incontinence, genital sores, muscle weakness, suspicious  skin lesions, transient blindness, difficulty walking, depression, unusual weight change, abnormal bleeding, enlarged lymph nodes, angioedema, and testicular masses.     Physical Exam  General:     Well-developed,well-nourished,in no acute distress; alert,appropriate and cooperative throughout examination Head:     Normocephalic and atraumatic without obvious abnormalities. No apparent alopecia or balding. Eyes:     No corneal or conjunctival inflammation noted. EOMI. Perrla. Funduscopic exam benign, without hemorrhages, exudates or papilledema. Vision grossly normal. Ears:     External ear exam shows no significant lesions or deformities.  Otoscopic examination reveals clear canals, tympanic membranes are intact bilaterally without bulging, retraction, inflammation or discharge. Hearing is grossly normal bilaterally. Nose:     External nasal examination shows no deformity or inflammation. Nasal mucosa are pink and moist without lesions or exudates. Mouth:     Oral mucosa and oropharynx without lesions or exudates.  Teeth in good repair. Neck:     No deformities, masses, or tenderness noted. Lungs:     Normal respiratory effort, chest expands symmetrically. Lungs are clear to auscultation, no crackles or wheezes. Heart:     Normal rate and regular rhythm. S1 and S2 normal without gallop, murmur, click, rub or other extra sounds. Abdomen:  Bowel sounds positive,abdomen soft and non-tender without masses, organomegaly or hernias noted. Rectal:     No external abnormalities noted. Normal sphincter tone. No rectal masses or tenderness. Genitalia:     Testes bilaterally descended without nodularity, tenderness or masses. No scrotal masses or lesions. No penis lesions or urethral discharge. Prostate:     Prostate gland firm and smooth, no enlargement, nodularity, tenderness, mass, asymmetry or induration. Msk:     No deformity or scoliosis noted of thoracic or lumbar spine.   Pulses:      R and L carotid,radial,femoral,dorsalis pedis and posterior tibial pulses are full and equal bilaterally Extremities:     No clubbing, cyanosis, edema, or deformity noted with normal full range of motion of all joints.   Neurologic:     No cranial nerve deficits noted. Station and gait are normal. Plantar reflexes are down-going bilaterally. DTRs are symmetrical throughout. Sensory, motor and coordinative functions appear intact. Skin:     Intact without suspicious lesions or rashes Cervical Nodes:     No lymphadenopathy noted Inguinal Nodes:     No significant adenopathy Psych:     Cognition and judgment appear intact. Alert and cooperative with normal attention span and concentration. No apparent delusions, illusions, hallucinations    Impression & Recommendations:  Problem # 1:  WELL ADULT EXAM (ICD-V70.0) Assessment: Comment Only PSA was 2.58 prior The labs were reviewed with the patient.  Reviewed preventive care protocols, scheduled due services, and updated immunizations. Health and age related issues were discussed. Available screening tests and vaccinations were discussed as well. Healthy life style including good diet and execise was discussed.   Orders: EKG w/ Interpretation (93000)   Problem # 2:  CBC, ABNORMAL (ICD-790.99) poss post URI Assessment: New Recheck in 2 mo  Problem # 3:  RASH AND OTHER NONSPECIFIC SKIN ERUPTION (ICD-782.1) dry skin Assessment: New 0.5% Triamcinolone cream in Eucerin lotion 1:10   Problem # 4:  HYPOTHYROIDISM (ICD-244.9) Assessment: Comment Only  His updated medication list for this problem includes:    Synthroid 25 Mcg Tabs (Levothyroxine sodium) ..... Qd (was on 1/2 a day)   His updated medication list for this problem includes:    Synthroid 25 Mcg Tabs (Levothyroxine sodium) ..... Qd   Problem # 5:  UPPER RESPIRATORY INFECTION (URI) (ICD-465.9) Assessment: Improved Given abx His updated medication list for this problem  includes:    Aspir-low 81 Mg Tbec (Aspirin) .[name]... 1 once daily pc   Complete Medication List: 1)  Fish Oil Oil (Fish oil) .... 4 per day 2)  Aspir-low 81 Mg Tbec (Aspirin) .[name].. 1 once daily pc 3)  Vitamin D3 1000 Unit Tabs (Cholecalciferol) .[name].. 1 qd 4)  Synthroid 25 Mcg Tabs (Levothyroxine sodium) .... Qd 5)  Viagra 100 Mg Tabs (Sildenafil citrate) .... Use prn 6)  Gnp Niacin Tr 500 Mg Tbcr (Niacin) .... 2 po qd 7)  Triamcinolone 0.5% Cream in Eucerin Lotion 1:10  .... Use two times a day prn 8)  Zithromax Z-pak 250 Mg Tabs (Azithromycin) .... Use as directed   Patient Instructions: 1)  Please schedule a follow-up appointment in 2 months. 2)  BMP prior to visit, ICD-9: 244.8  995.20 3)  TSH prior to visit, ICD-9: 4)  Hepatic Panel prior to visit, ICD-9:   Prescriptions: ZITHROMAX Z-PAK 250 MG  TABS (AZITHROMYCIN) Use as directed  #1 x 0   Entered and Authorized by:   Tresa Garter MD   Signed by:   Macarthur Critchley  V Ascencion Coye MD on 10/07/2008   Method used:   Print then Give to Patient   RxID:   [id number] TRIAMCINOLONE 0.5% CREAM IN EUCERIN LOTION 1:10 use two times a day prn  #500 g x 4   Entered and Authorized by:   Tresa Garter MD   Signed by:   Tresa Garter MD on 10/07/2008   Method used:   Print then Give to Patient   RxID:   [id number]

## 2010-10-17 NOTE — Miscellaneous (Addendum)
Summary: PT/[facility]  PT/[facility]   Imported By: Sherian Rein 07/07/2009 09:46:04  _____________________________________________________________________  External Attachment:    Type:   Image     Comment:   External Document

## 2010-10-17 NOTE — Assessment & Plan Note (Addendum)
Summary: 4-5 MTH FU-STC   Vital Signs:  Patient Profile:   65 Years Old Male Weight:      182 pounds Temp:     97.6 degrees F oral Pulse rate:   40 / minute BP sitting:   124 / 86  (right arm)  Vitals Entered By: Tora Perches (April 08, 2008 8:09 AM)                 Chief Complaint:  Multiple medical problems or concerns.  History of Present Illness: The patient presents for a follow up of weakness, hyperlipidemia     Current Allergies (reviewed today): LIPITOR  Past Medical History:    Reviewed history from 07/10/2007 and no changes required:       Hyperlipidemia       Hypothyroidism       W. Nile virus 2008   Family History:    Reviewed history from 07/10/2007 and no changes required:       Family History of CAD Male 1st degree relative <60  Social History:    Reviewed history from 07/10/2007 and no changes required:       Retired       Married       Never Smoked       Alcohol use-no       Regular exercise-yes     Physical Exam  General:     Well-developed,well-nourished,in no acute distress; alert,appropriate and cooperative throughout examination Eyes:     No corneal or conjunctival inflammation noted. EOMI. Perrla. Funduscopic exam benign, without hemorrhages, exudates or papilledema. Vision grossly normal. Ears:     External ear exam shows no significant lesions or deformities.  Otoscopic examination reveals clear canals, tympanic membranes are intact bilaterally without bulging, retraction, inflammation or discharge. Hearing is grossly normal bilaterally. Nose:     External nasal examination shows no deformity or inflammation. Nasal mucosa are pink and moist without lesions or exudates. Mouth:     Oral mucosa and oropharynx without lesions or exudates.  Teeth in good repair. Neck:     No deformities, masses, or tenderness noted. Lungs:     Normal respiratory effort, chest expands symmetrically. Lungs are clear to auscultation, no crackles or  wheezes. Heart:     Normal rate and regular rhythm. S1 and S2 normal without gallop, murmur, click, rub or other extra sounds. Abdomen:     Bowel sounds positive,abdomen soft and non-tender without masses, organomegaly or hernias noted. Msk:     No deformity or scoliosis noted of thoracic or lumbar spine.   Pulses:     R and L carotid,radial,femoral,dorsalis pedis and posterior tibial pulses are full and equal bilaterally Extremities:     No clubbing, cyanosis, edema, or deformity noted with normal full range of motion of all joints.   Neurologic:     No cranial nerve deficits noted. Station and gait are normal. Plantar reflexes are down-going bilaterally. DTRs are symmetrical throughout. Sensory, motor and coordinative functions appear intact. Skin:     Intact without suspicious lesions or rashes Psych:     Cognition and judgment appear intact. Alert and cooperative with normal attention span and concentration. No apparent delusions, illusions, hallucinations    Impression & Recommendations:  Problem # 1:  HYPOTHYROIDISM (ICD-244.9) Assessment: Improved  His updated medication list for this problem includes:    Synthroid 25 Mcg Tabs (Levothyroxine sodium) ..... Qd   Problem # 2:  HYPERLIPIDEMIA (ICD-272.4) Assessment: Improved  His updated medication list  for this problem includes:    Gnp Niacin Tr 500 Mg Tbcr (Niacin) ..... Qd  Labs Reviewed: Chol: 232 (04/02/2008)   HDL: 40.9 (04/02/2008)   LDL: DEL (04/02/2008)   TG: 230 (04/02/2008) SGOT: 23 (11/04/2007)   SGPT: 21 (11/04/2007)   Problem # 3:  ELEVATED PROSTATE SPECIFIC ANTIGEN (ICD-790.93) Assessment: Unchanged  Problem # 4:  Leg weakness from Lipitor - resolved Assessment: Comment Only  Complete Medication List: 1)  Gnp Niacin Tr 500 Mg Tbcr (Niacin) .... Qd 2)  Fish Oil Oil (Fish oil) .... 6 per day 3)  Aspir-low 81 Mg Tbec (Aspirin) .[name].. 1 once daily pc 4)  Vitamin D3 1000 Unit Tabs (Cholecalciferol) .[name].. 1  qd 5)  Synthroid 25 Mcg Tabs (Levothyroxine sodium) .... Qd   Complete Medication List: 1)  Gnp Niacin Tr 500 Mg Tbcr (Niacin) .... Qd 2)  Fish Oil Oil (Fish oil) .... 6 per day 3)  Aspir-low 81 Mg Tbec (Aspirin) .[name].. 1 once daily pc 4)  Vitamin D3 1000 Unit Tabs (Cholecalciferol) .[name].. 1 qd 5)  Synthroid 25 Mcg Tabs (Levothyroxine sodium) .... Qd   Patient Instructions: 1)  Please schedule a follow-up appointment in 6 months well with labs.   Prescriptions: SYNTHROID 25 MCG TABS (LEVOTHYROXINE SODIUM) qd  #30 x 12   Entered and Authorized by:   Tresa Garter MD   Signed by:   Tresa Garter MD on 04/08/2008   Method used:   Print then Give to Patient   RxID:   [id number]  ]

## 2010-10-17 NOTE — Assessment & Plan Note (Addendum)
Summary: cold sx not better/cd   Vital Signs:  Patient Profile:   65 Years Old Male Weight:      185 pounds Temp:     97.8 degrees F oral Pulse rate:   48 / minute BP sitting:   110 / 70  (left arm)  Vitals Entered By: Tora Perches (October 15, 2008 4:37 PM)                 Chief Complaint:  Multiple medical problems or concerns.  History of Present Illness: The patient presents with complaints of cough, sinus congestion and drainge of 2 wks duration. Not better with OTC meds and Z pac. Chest hurts with coughing. Can't sleep due to cough. .  The mucus is colored.     Prior Medications Reviewed Using: Patient Recall  Prior Medication List:  FISH OIL   OIL (FISH OIL) 4 per day ASPIR-LOW 81 MG TBEC (ASPIRIN) 1 once daily pc VITAMIN D3 1000 UNIT  TABS (CHOLECALCIFEROL) 1 qd SYNTHROID 25 MCG TABS (LEVOTHYROXINE SODIUM) qd VIAGRA 100 MG TABS (SILDENAFIL CITRATE) use prn GNP NIACIN TR 500 MG  TBCR (NIACIN) 2 po qd * TRIAMCINOLONE 0.5% CREAM IN EUCERIN LOTION 1:10 use two times a day prn   Current Allergies (reviewed today): LIPITOR  Past Medical History:    Reviewed history from 07/10/2007 and no changes required:       Hyperlipidemia       Hypothyroidism       W. Nile virus 2008       Asthmatic bronchitis   Family History:    Reviewed history from 07/10/2007 and no changes required:       Family History of CAD Male 1st degree relative <60  Social History:    Reviewed history from 07/10/2007 and no changes required:       Retired       Married       Never Smoked       Alcohol use-no       Regular exercise-yes     Physical Exam  General:     Well-developed,well-nourished,in no acute distress; alert,appropriate and cooperative throughout examination Mouth:     Erythematous throat mucosa and intranasal erythema.  Lungs:     CTA Heart:     RRR Abdomen:     S/NT Extremities:     No clubbing, cyanosis, edema, or deformity noted with normal full range  of motion of all joints.   Neurologic:     No cranial nerve deficits noted. Station and gait are normal. Plantar reflexes are down-going bilaterally. DTRs are symmetrical throughout. Sensory, motor and coordinative functions appear intact.    Impression & Recommendations:  Problem # 1:  COUGH (ICD-786.2) Assessment: Deteriorated  Orders: T-2 View CXR (71020TC)   Problem # 2:  BRONCHITIS, ACUTE  - asthmatic Assessment: Deteriorated Given Predn, MDI Repeat abx  Complete Medication List: 1)  Fish Oil Oil (Fish oil) .... 4 per day 2)  Aspir-low 81 Mg Tbec (Aspirin) .[name].. 1 once daily pc 3)  Vitamin D3 1000 Unit Tabs (Cholecalciferol) .[name].. 1 qd 4)  Synthroid 25 Mcg Tabs (Levothyroxine sodium) .... Qd 5)  Viagra 100 Mg Tabs (Sildenafil citrate) .... Use prn 6)  Gnp Niacin Tr 500 Mg Tbcr (Niacin) .... 2 po qd 7)  Triamcinolone 0.5% Cream in Eucerin Lotion 1:10  .... Use two times a day prn 8)  Advair Diskus 250-50 Mcg/dose Misc (Fluticasone-salmeterol) .[name].. 1 puff 2 times daily 9)  Prednisone  10 Mg Tabs (Prednisone) .... Take 40mg  qd for 3 days, then 20 mg qd for 3 days, then 10mg  qd for 6 days, then stop. take pc. 10)  Promethazine-codeine 6.25-10 Mg/41ml Syrp (Promethazine-codeine) .... Take 5-49mls by mouth every 4-6 hours as needed for cough 11)  Zithromax Z-pak 250 Mg Tabs (Azithromycin) .... Use as directed   Patient Instructions: 1)  Maxiphen Dm two times a day  2)  Call if you are not better in a reasonable ammount of time or if worse.    Prescriptions: ZITHROMAX Z-PAK 250 MG  TABS (AZITHROMYCIN) Use as directed  #1 x 0   Entered and Authorized by:   Tresa Garter MD   Signed by:   Tresa Garter MD on 10/15/2008   Method used:   Print then Give to Patient   RxID:   [id number] PROMETHAZINE-CODEINE 6.25-10 MG/5ML  SYRP (PROMETHAZINE-CODEINE) Take 5-67mls by mouth every 4-6 hours as needed for cough  #370mls x 0   Entered and Authorized by:   Tresa Garter MD   Signed by:   Tresa Garter MD on 10/15/2008   Method used:   Print then Give to Patient   RxID:   [id number] PREDNISONE 10 MG  TABS (PREDNISONE) Take 40mg  qd for 3 days, then 20 mg qd for 3 days, then 10mg  qd for 6 days, then stop. Take pc.  #24 x 0   Entered and Authorized by:   Tresa Garter MD   Signed by:   Tresa Garter MD on 10/15/2008   Method used:   Print then Give to Patient   RxID:   [id number] ADVAIR DISKUS 250-50 MCG/DOSE MISC (FLUTICASONE-SALMETEROL) 1 puff 2 times daily  #1 x 1   Entered and Authorized by:   Tresa Garter MD   Signed by:   Tresa Garter MD on 10/15/2008   Method used:   Print then Give to Patient   RxID:   [id number]

## 2010-10-17 NOTE — Consult Note (Addendum)
Summary: [facility]  [facility]   Imported By: Sherian Rein 10/13/2009 12:15:55  _____________________________________________________________________  External Attachment:    Type:   Image     Comment:   External Document

## 2010-10-17 NOTE — Assessment & Plan Note (Addendum)
Summary: 2 mos f/u  $50 / cd   Vital Signs:  Patient profile:   65 year old male Height:      73 inches Weight:      185 pounds BMI:     24.50 Temp:     97 degrees F oral Pulse rate:   59 / minute BP sitting:   96 / 66  (left arm)  Vitals Entered By: Tora Perches (December 23, 2008 8:31 AM) CC: f/u Is Patient Diabetic? No   CC:  f/u.  History of Present Illness: The patient presents for a follow up of  fatigue,  hyperlipidemia, abn labs. Fell running on R. Had hot flashes w/2 Niacins   Current Medications (verified): 1)  Fish Oil   Oil (Fish Oil) .... 4 Per Day 2)  Aspir-Low 81 Mg Tbec (Aspirin) .[name].. 1 Once Daily Pc 3)  Vitamin D3 1000 Unit  Tabs (Cholecalciferol) .[name].. 1 Qd 4)  Synthroid 25 Mcg Tabs (Levothyroxine Sodium) .... Qd 5)  Viagra 100 Mg Tabs (Sildenafil Citrate) .... Use Prn 6)  Gnp Niacin Tr 500 Mg  Tbcr (Niacin) .... 2 Po Qd 7)  Triamcinolone 0.5% Cream in Eucerin Lotion 1:10 .... Use Two Times A Day Prn  Allergies: 1)  Lipitor  Past History:  Past Medical History:    Hyperlipidemia    Hypothyroidism    W. Nile virus 2008    Asthmatic bronchitis     (10/15/2008)  Past Surgical History:    Denies surgical history    Tonsillectomy     (07/10/2007)  Family History:    Family History of CAD Male 1st degree relative <60     (07/10/2007)  Social History:    Retired    Married    Never Smoked    Alcohol use-no    Regular exercise-yes     (07/10/2007)  Risk Factors:    Alcohol Use: N/A    >5 drinks/d w/in last 3 months: N/A    Caffeine Use: N/A    Diet: N/A    Exercise: yes (07/10/2007)  Risk Factors:    Smoking Status: never (07/10/2007)    Packs/Day: N/A    Cigars/wk: N/A    Pipe Use/wk: N/A    Cans of tobacco/wk: N/A    Passive Smoke Exposure: N/A  Family History:    Reviewed history from 07/10/2007 and no changes required:       Family History of CAD Male 1st degree relative <60  Social History:    Reviewed history from  07/10/2007 and no changes required:       Retired       Married       Never Smoked       Alcohol use-no       Regular exercise-yes  Physical Exam  General:  Well-developed,well-nourished,in no acute distress; alert,appropriate and cooperative throughout examination Eyes:  No corneal or conjunctival inflammation noted. EOMI. Perrla. Funduscopic exam benign, without hemorrhages, exudates or papilledema. Vision grossly normal. Mouth:  Erythematous throat mucosa and intranasal erythema.  Neck:  No deformities, masses, or tenderness noted. Lungs:  CTA Heart:  RRR Abdomen:  Bowel sounds positive,abdomen soft and non-tender without masses, organomegaly or hernias noted. Msk:  Bruised R forearm and R lower leg below knee w/abrasions Extremities:  No clubbing, cyanosis, edema, or deformity noted with normal full range of motion of all joints.   Neurologic:  No cranial nerve deficits noted. Station and gait are normal. Plantar reflexes are down-going bilaterally.  DTRs are symmetrical throughout. Sensory, motor and coordinative functions appear intact. Skin:  as above Psych:  Cognition and judgment appear intact. Alert and cooperative with normal attention span and concentration. No apparent delusions, illusions, hallucinations   Impression & Recommendations:  Problem # 1:  HYPERLIPIDEMIA (ICD-272.4) Assessment Comment Only  His updated medication list for this problem includes:    Gnp Niacin Tr 500 Mg Tbcr (Niacin) .[name]... 1 po once daily able to tol The labs were reviewed with the patient.  Problem # 2:  LEG PAIN (ICD-729.5) R cont and lacerations Assessment: New Dressed wounds;  dT up to date  Problem # 3:  FATIGUE (ICD-780.79) resolved Assessment: Comment Only  Problem # 4:  CBC, ABNORMAL (ICD-790.99) Assessment: Comment Only Off ASA. Recheck in 6 mo  Complete Medication List: 1)  Fish Oil Oil (Fish oil) .... 4 per day 2)  Vitamin D3 1000 Unit Tabs (Cholecalciferol) .[name].. 1 qd 3)   Synthroid 25 Mcg Tabs (Levothyroxine sodium) .... Qd 4)  Viagra 100 Mg Tabs (Sildenafil citrate) .... Use prn 5)  Gnp Niacin Tr 500 Mg Tbcr (Niacin) .[name].. 1 po qd 6)  Triamcinolone 0.5% Cream in Eucerin Lotion 1:10  .... Use two times a day prn  Patient Instructions: 1)  Please schedule a follow-up appointment in 6 months well w/labs.

## 2010-10-17 NOTE — Assessment & Plan Note (Addendum)
Summary: 6 MO ROV /NWS $50   Vital Signs:  Patient profile:   65 year old male Weight:      184 pounds Temp:     97.4 degrees F oral Pulse rate:   44 / minute BP sitting:   116 / 60  (left arm)  Vitals Entered By: Tora Perches (June 28, 2009 8:17 AM) CC: f/u Is Patient Diabetic? No   CC:  f/u.  History of Present Illness: The patient presents for a follow up of aches in the shins after running down the hill, hypothyroidism, hyperlipidemia   Preventive Screening-Counseling & Management  Alcohol-Tobacco     Smoking Status: never  Current Medications (verified): 1)  Fish Oil   Oil (Fish Oil) .... 4 Per Day 2)  Vitamin D3 1000 Unit  Tabs (Cholecalciferol) .[name].. 1 Qd 3)  Synthroid 25 Mcg Tabs (Levothyroxine Sodium) .... Qd 4)  Viagra 100 Mg Tabs (Sildenafil Citrate) .... Use Prn 5)  Gnp Niacin Tr 500 Mg  Tbcr (Niacin) .[name].. 1 Po Qd 6)  Triamcinolone 0.5% Cream in Eucerin Lotion 1:10 .... Use Two Times A Day Prn 7)  Ibuprofen 800 Mg Tabs (Ibuprofen) .... Three Times A Day  Allergies: 1)  Lipitor  Past History:  Past Medical History: Last updated: 10/15/2008 Hyperlipidemia Hypothyroidism W. Nile virus 2008 Asthmatic bronchitis  Social History: Last updated: 07/10/2007 Retired Married Never Smoked Alcohol use-no Regular exercise-yes  Review of Systems  The patient denies fever, chest pain, dyspnea on exertion, and abdominal pain.    Physical Exam  General:  Well-developed,well-nourished,in no acute distress; alert,appropriate and cooperative throughout examination Eyes:  dry scaly skin around eyes Nose:  External nasal examination shows no deformity or inflammation. Nasal mucosa are pink and moist without lesions or exudates. Mouth:  Erythematous throat mucosa and intranasal erythema.  Lungs:  Normal respiratory effort, chest expands symmetrically. Lungs are clear to auscultation, no crackles or wheezes. Heart:  Normal rate and regular rhythm. S1 and S2  normal without gallop, murmur, click, rub or other extra sounds. Extremities:  No clubbing, cyanosis, edema, or deformity noted with normal full range of motion of all joints.   Neurologic:  No cranial nerve deficits noted. Station and gait are normal. Plantar reflexes are down-going bilaterally. DTRs are symmetrical throughout. Sensory, motor and coordinative functions appear intact. Skin:  Intact without suspicious lesions or rashes Psych:  Cognition and judgment appear intact. Alert and cooperative with normal attention span and concentration. No apparent delusions, illusions, hallucinations   Impression & Recommendations:  Problem # 1:  CBC, ABNORMAL (ICD-790.99) Assessment Improved  Problem # 2:  LEG PAIN (ICD-729.5) Assessment: Comment Only Resolved  Problem # 3:  HYPOTHYROIDISM (ICD-244.9) Assessment: Comment Only  His updated medication list for this problem includes:    Synthroid 25 Mcg Tabs (Levothyroxine sodium) ..... Qd  Problem # 4:  HYPERLIPIDEMIA (ICD-272.4) intol of Rx Assessment: Deteriorated  His updated medication list for this problem includes:    Gnp Niacin Tr 500 Mg Tbcr (Niacin) .[name]... 1 po qd  Problem # 5:  RASH AND OTHER NONSPECIFIC SKIN ERUPTION (ICD-782.1) around eyes Assessment: Comment Only HCT 1 % two times a day prn  Complete Medication List: 1)  Fish Oil Oil (Fish oil) .... 4 per day 2)  Vitamin D3 1000 Unit Tabs (Cholecalciferol) .[name].. 1 qd 3)  Synthroid 25 Mcg Tabs (Levothyroxine sodium) .... Qd 4)  Viagra 100 Mg Tabs (Sildenafil citrate) .... Use prn 5)  Gnp Niacin Tr 500 Mg Tbcr (  Niacin) .[name].. 1 po qd 6)  Triamcinolone 0.5% Cream in Eucerin Lotion 1:10  .... Use two times a day prn 7)  Ibuprofen 800 Mg Tabs (Ibuprofen) .... Three times a day  Patient Instructions: 1)  Use stretching exercises that I have provided (15 min. or longer every day) 2)  Please schedule a follow-up appointment in 6 months well w/labs.

## 2010-10-17 NOTE — Letter (Addendum)
Summary: [facility]  [facility]   Imported By: Sherian Rein 02/22/2010 07:45:28  _____________________________________________________________________  External Attachment:    Type:   Image     Comment:   External Document

## 2010-10-17 NOTE — Assessment & Plan Note (Addendum)
Summary: SORE THROAT,EAR ACHE -COUGH-STC    History of Present Illness: The patient presents with complaints of sore throat, fever, cough, sinus congestion and drainge of several days duration. Not better with OTC meds. Chest hurts with coughing. Can't sleep due to cough. The mucus is colored x 5 d.   Current Allergies: LIPITOR  Past Medical History:    Reviewed history from 07/10/2007 and no changes required:       Hyperlipidemia       Hypothyroidism       W. Nile virus 2008   Family History:    Reviewed history from 07/10/2007 and no changes required:       Family History of CAD Male 1st degree relative <60     Physical Exam  General:     Well-developed,well-nourished,in no acute distress; alert,appropriate and cooperative throughout examination Ears:     L TM red Nose:     Eryth Mouth:     Eryth. throat Neck:     No deformities, masses, or tenderness noted. Lungs:     CTA Heart:     Normal rate and regular rhythm. S1 and S2 normal without gallop, murmur, click, rub or other extra sounds. Abdomen:     Bowel sounds positive,abdomen soft and non-tender without masses, organomegaly or hernias noted. Msk:     No deformity or scoliosis noted of thoracic or lumbar spine.   Neurologic:     alert & oriented X3.      Impression & Recommendations:  Problem # 1:  UPPER RESPIRATORY INFECTION (URI) (ICD-465.9) Assessment: New Z pac x 5 d His updated medication list for this problem includes:    Aspir-low 81 Mg Tbec (Aspirin) .[name]... 1 once daily pc  Orders: T-2 View CXR, Same Day (71020.5TC)   Complete Medication List: 1)  Gnp Niacin Tr 500 Mg Tbcr (Niacin) .... Qd 2)  Fish Oil Oil (Fish oil) .... 6 per day 3)  Aspir-low 81 Mg Tbec (Aspirin) .[name].. 1 once daily pc 4)  Vitamin D3 1000 Unit Tabs (Cholecalciferol) .[name].. 1 qd 5)  Synthroid 25 Mcg Tabs (Levothyroxine sodium) .... Qd 6)  Zithromax Z-pak 250 Mg Tabs (Azithromycin) .... As directed   Patient  Instructions: 1)  Call if problems    Prescriptions: SYNTHROID 25 MCG TABS (LEVOTHYROXINE SODIUM) qd  #30 x 12   Entered and Authorized by:   Tresa Garter MD   Signed by:   Tresa Garter MD on 09/25/2007   Method used:   Electronically sent to ...       [facility]  [phone]*       [address]       [place], [place]  [address]       Ph: [phone] or [phone]       Fax: [phone]   RxID:   [id number] ZITHROMAX Z-PAK 250 MG  TABS (AZITHROMYCIN) as directed  #1 x 0   Entered and Authorized by:   Tresa Garter MD   Signed by:   Tresa Garter MD on 09/25/2007   Method used:   Electronically sent to ...       [facility]  [phone]*       [address]       [place], [place]  [address]       Ph: [phone] or [phone]       Fax: [phone]   RxID:   [id number]  ]

## 2010-10-17 NOTE — Progress Notes (Addendum)
Summary: LAB ORDER  Phone Note Call from Patient Call back at [phone]   Summary of Call: Patient is requesting a call regarding lab order.  Initial call taken by: Lamar Sprinkles, CMA,  March 29, 2010 10:12 AM  Follow-up for Phone Call        left mess to call office back ..................Marland KitchenLamar Sprinkles, CMA  March 29, 2010 5:48 PM   Patietn called again lmovm requesting a call back to discuss follow up appt and lab work.Marland KitchenMarland KitchenAlvy Beal Archie CMA  March 30, 2010 12:25 PM   Spoke with patient and advised that lab order was entered in the system at last appt. Patient now aware and will came in and have labs done prior to appt.Marland KitchenMarland KitchenAlvy Beal Archie CMA  March 30, 2010 12:41 PM

## 2010-10-17 NOTE — Progress Notes (Addendum)
Summary: triamcinolon  Phone Note Other Incoming   Call placed by: [facility] - Raynelle Fanning [phone]  Summary of Call: Raynelle Fanning from [facility] called and stated you wrote a compound rx for Triamcinolone cream and Euricen lotion...but rx you wrote it as 1:10 they want to know did you mean 1:10 or should it be 1:1 Initial call taken by: Windell Norfolk,  October 07, 2008 9:57 AM  Follow-up for Phone Call        1:10 is correct.  thx Follow-up by: Tresa Garter MD,  October 07, 2008 5:26 PM  Additional Follow-up for Phone Call Additional follow up Details #1::        called pharmacy and made them aware  Additional Follow-up by: Windell Norfolk,  October 08, 2008 11:18 AM

## 2010-10-20 ENCOUNTER — Other Ambulatory Visit: Payer: Self-pay

## 2010-10-20 ENCOUNTER — Ambulatory Visit: Admit: 2010-10-20 | Payer: Self-pay | Admitting: Internal Medicine

## 2010-10-23 ENCOUNTER — Other Ambulatory Visit: Payer: Self-pay | Admitting: Internal Medicine

## 2010-10-23 ENCOUNTER — Other Ambulatory Visit: Payer: Medicare Other

## 2010-10-23 ENCOUNTER — Encounter (INDEPENDENT_AMBULATORY_CARE_PROVIDER_SITE_OTHER): Payer: Self-pay | Admitting: *Deleted

## 2010-10-23 DIAGNOSIS — Z0000 Encounter for general adult medical examination without abnormal findings: Secondary | ICD-10-CM

## 2010-10-23 DIAGNOSIS — E785 Hyperlipidemia, unspecified: Secondary | ICD-10-CM

## 2010-10-23 DIAGNOSIS — Z0389 Encounter for observation for other suspected diseases and conditions ruled out: Secondary | ICD-10-CM

## 2010-10-23 LAB — CBC WITH DIFFERENTIAL/PLATELET
Basophils Absolute: 0 10*3/uL (ref 0.0–0.1)
Basophils Relative: 0.5 % (ref 0.0–3.0)
Eosinophils Absolute: 0.1 10*3/uL (ref 0.0–0.7)
Eosinophils Relative: 1.9 % (ref 0.0–5.0)
HCT: 39.2 % (ref 39.0–52.0)
Hemoglobin: 13.2 g/dL (ref 13.0–17.0)
Lymphocytes Relative: 37.7 % (ref 12.0–46.0)
Lymphs Abs: 1.5 10*3/uL (ref 0.7–4.0)
MCHC: 33.7 g/dL (ref 30.0–36.0)
MCV: 97.8 fl (ref 78.0–100.0)
Monocytes Absolute: 0.4 10*3/uL (ref 0.1–1.0)
Monocytes Relative: 10.2 % (ref 3.0–12.0)
Neutro Abs: 2 10*3/uL (ref 1.4–7.7)
Neutrophils Relative %: 49.7 % (ref 43.0–77.0)
Platelets: 162 10*3/uL (ref 150.0–400.0)
RBC: 4.01 Mil/uL — ABNORMAL LOW (ref 4.22–5.81)
RDW: 13 % (ref 11.5–14.6)
WBC: 4.1 10*3/uL — ABNORMAL LOW (ref 4.5–10.5)

## 2010-10-23 LAB — URINALYSIS
Bilirubin Urine: NEGATIVE
Hgb urine dipstick: NEGATIVE
Ketones, ur: NEGATIVE
Leukocytes, UA: NEGATIVE
Nitrite: NEGATIVE
Specific Gravity, Urine: 1.01 (ref 1.000–1.030)
Total Protein, Urine: NEGATIVE
Urine Glucose: NEGATIVE
Urobilinogen, UA: 0.2 (ref 0.0–1.0)
pH: 6 (ref 5.0–8.0)

## 2010-10-23 LAB — HEPATIC FUNCTION PANEL
ALT: 17 U/L (ref 0–53)
AST: 24 U/L (ref 0–37)
Albumin: 4 g/dL (ref 3.5–5.2)
Alkaline Phosphatase: 44 U/L (ref 39–117)
Bilirubin, Direct: 0.1 mg/dL (ref 0.0–0.3)
Total Bilirubin: 0.7 mg/dL (ref 0.3–1.2)
Total Protein: 6.3 g/dL (ref 6.0–8.3)

## 2010-10-23 LAB — LIPID PANEL
Cholesterol: 207 mg/dL — ABNORMAL HIGH (ref 0–200)
HDL: 50.6 mg/dL (ref >39.00)
Total CHOL/HDL Ratio: 4
Triglycerides: 146 mg/dL (ref 0.0–149.0)
VLDL: 29.2 mg/dL (ref 0.0–40.0)

## 2010-10-23 LAB — BASIC METABOLIC PANEL
BUN: 18 mg/dL (ref 6–23)
CO2: 30 mEq/L (ref 19–32)
Calcium: 9.4 mg/dL (ref 8.4–10.5)
Chloride: 104 mEq/L (ref 96–112)
Creatinine, Ser: 1.1 mg/dL (ref 0.4–1.5)
GFR: 71.5 mL/min (ref >60.00)
Glucose, Bld: 97 mg/dL (ref 70–99)
Potassium: 4.8 mEq/L (ref 3.5–5.1)
Sodium: 140 mEq/L (ref 135–145)

## 2010-10-23 LAB — LDL CHOLESTEROL, DIRECT: Direct LDL: 125.3 mg/dL

## 2010-10-23 LAB — TSH: TSH: 2.61 u[IU]/mL (ref 0.35–5.50)

## 2010-10-23 LAB — PSA: PSA: 3 ng/mL (ref 0.10–4.00)

## 2010-10-27 ENCOUNTER — Encounter: Payer: Self-pay | Admitting: Internal Medicine

## 2010-10-27 ENCOUNTER — Ambulatory Visit (INDEPENDENT_AMBULATORY_CARE_PROVIDER_SITE_OTHER): Payer: Federal, State, Local not specified - PPO | Admitting: Internal Medicine

## 2010-10-27 ENCOUNTER — Ambulatory Visit: Payer: BC Managed Care – PPO | Admitting: Internal Medicine

## 2010-10-27 DIAGNOSIS — J309 Allergic rhinitis, unspecified: Secondary | ICD-10-CM | POA: Insufficient documentation

## 2010-10-27 DIAGNOSIS — M791 Myalgia, unspecified site: Secondary | ICD-10-CM | POA: Insufficient documentation

## 2010-10-27 DIAGNOSIS — J45909 Unspecified asthma, uncomplicated: Secondary | ICD-10-CM | POA: Insufficient documentation

## 2010-10-27 DIAGNOSIS — Z0000 Encounter for general adult medical examination without abnormal findings: Secondary | ICD-10-CM

## 2010-10-27 DIAGNOSIS — IMO0001 Reserved for inherently not codable concepts without codable children: Secondary | ICD-10-CM

## 2010-10-27 DIAGNOSIS — M1990 Unspecified osteoarthritis, unspecified site: Secondary | ICD-10-CM | POA: Insufficient documentation

## 2010-10-27 DIAGNOSIS — D485 Neoplasm of uncertain behavior of skin: Secondary | ICD-10-CM | POA: Insufficient documentation

## 2010-10-27 DIAGNOSIS — E785 Hyperlipidemia, unspecified: Secondary | ICD-10-CM

## 2010-10-27 DIAGNOSIS — M79609 Pain in unspecified limb: Secondary | ICD-10-CM

## 2010-11-08 NOTE — Assessment & Plan Note (Signed)
Summary: 6 mos f/u   Vital Signs:  Patient profile:   65 year old male Weight:      179 pounds Temp:     97.4 degrees F oral Pulse rate:   64 / minute Pulse rhythm:   regular Resp:     16 per minute BP sitting:   90 / 60  (left arm) Cuff size:   regular  Vitals Entered By: Lanier Prude, CMA(AAMA) (October 27, 2010 8:19 AM) CC: 6 mo f/u  Is Patient Diabetic? No   CC:  6 mo f/u .  History of Present Illness: The patient presents for a preventive health examination. Please see his other file (old file in EMR - this one was created/opened due to EMR mulfunction) for PMH, Surg hx, Social hx, Fam hx. C/o Crestor caused aches C/o occasional dry cough C/o occasional groin pains  Current Medications (verified): 1)  Fish Oil  Oil (Fish Oil) .... Take 4 By Mouth Once Daily 2)  Vitamin D3 2000 Unit Caps (Cholecalciferol) .[name].. 1 By Mouth Once Daily 3)  Synthroid 25 Mcg Tabs (Levothyroxine Sodium) .[name].. 1 By Mouth Once Daily 4)  Viagra 100 Mg Tabs (Sildenafil Citrate) .... Use As Needed 5)  Niacin Cr 500 Mg Cr-Tabs (Niacin) .... 1500mg  By Mouth Once Daily 6)  Triamcinolone Acetonide 0.5 % Crea (Triamcinolone Acetonide) .... Use Two Times A Day As Needed 7)  Ibuprofen 800 Mg Tabs (Ibuprofen) .... Take By Mouth Three Times Daily As Needed. 8)  Crestor 10 Mg Tabs (Rosuvastatin Calcium) .[name].. 1 By Mouth Once Daily  Allergies (verified): 1)  ! Lipitor (Atorvastatin Calcium)  Past History:  Past Medical History: Hyperlipidemia Osteoarthritis Allergic rhinitis  Past Surgical History: Denies surgical history  Review of Systems       The patient complains of prolonged cough.  The patient denies anorexia, fever, weight loss, weight gain, vision loss, decreased hearing, hoarseness, chest pain, syncope, dyspnea on exertion, peripheral edema, headaches, hemoptysis, abdominal pain, melena, hematochezia, severe indigestion/heartburn, hematuria, incontinence, genital sores, muscle weakness,  suspicious skin lesions, transient blindness, difficulty walking, depression, unusual weight change, abnormal bleeding, enlarged lymph nodes, angioedema, and testicular masses.    Physical Exam  General:  Well-developed,well-nourished,in no acute distress; alert,appropriate and cooperative throughout examination Head:  Normocephalic and atraumatic without obvious abnormalities. No apparent alopecia or balding. Eyes:  No corneal or conjunctival inflammation noted. EOMI. Perrla. Ears:  External ear exam shows no significant lesions or deformities.  Otoscopic examination reveals clear canals, tympanic membranes are intact bilaterally without bulging, retraction, inflammation or discharge. Hearing is grossly normal bilaterally. Nose:  External nasal examination shows no deformity or inflammation. Nasal mucosa are pink and moist without lesions or exudates. Mouth:  Oral mucosa and oropharynx without lesions or exudates.  Teeth in good repair. Neck:  No deformities, masses, or tenderness noted. Lungs:  Normal respiratory effort, chest expands symmetrically. Lungs are clear to auscultation, no crackles or wheezes. Heart:  Normal rate and regular rhythm. S1 and S2 normal without gallop, murmur, click, rub or other extra sounds. Abdomen:  Bowel sounds positive,abdomen soft and non-tender without masses, organomegaly or hernias noted. Rectal:  No external abnormalities noted. Normal sphincter tone. No rectal masses or tenderness. Genitalia:  Testes bilaterally descended without nodularity, tenderness or masses. No scrotal masses or lesions. No penis lesions or urethral discharge. Prostate:  no nodules, no asymmetry, and 1+ enlarged.   Msk:  No deformity or scoliosis noted of thoracic or lumbar spine.  R groin is a  little tender Pulses:  R and L carotid,radial,femoral,dorsalis pedis and posterior tibial pulses are full and equal bilaterally Extremities:  No clubbing, cyanosis, edema, or deformity noted with  normal full range of motion of all joints.   Neurologic:  No cranial nerve deficits noted. Station and gait are normal. Plantar reflexes are down-going bilaterally. DTRs are symmetrical throughout. Sensory, motor and coordinative functions appear intact. Skin:  moles 1 on back and one on R buttock - dark Cervical Nodes:  No lymphadenopathy noted Inguinal Nodes:  No significant adenopathy Psych:  Cognition and judgment appear intact. Alert and cooperative with normal attention span and concentration. No apparent delusions, illusions, hallucinations   Impression & Recommendations:  Problem # 1:  HEALTH MAINTENANCE EXAM (ICD-V70.0) Assessment New Health and age related issues were discussed. Available screening tests and vaccinations were discussed as well. Healthy life style including good diet and exercise was discussed.  The labs were reviewed with the patient.  Orders: Gastroenterology Referral (GI) colonoscopy to sch PSA -- 2.6-3.0 x years  Problem # 2:  COUGH (ICD-786.2) - poss. asthmatic Assessment: New He had CXR in 2009 - nl See "Patient Instructions". On the regimen of medicine(s) reflected in the chart  - try Dulera  Problem # 3:  NEOPLASM OF UNCERTAIN BEHAVIOR OF SKIN (ICD-238.2) Assessment: New skin biopsy w/me  Problem # 4:  ASTHMA (ICD-493.90) Assessment: New  His updated medication list for this problem includes:    Dulera 100-5 Mcg/act Aero (Mometasone furo-formoterol fum) .[name]... 1 inh bid  Problem # 5:  MYALGIA (ICD-729.1) Assessment: Deteriorated Decrease or d/c Crestor His updated medication list for this problem includes:    Ibuprofen 800 Mg Tabs (Ibuprofen) .[name]... Take by mouth three times daily as needed.  Problem # 6:  HYPERLIPIDEMIA (ICD-272.4) Assessment: Improved As above The following medications were removed from the medication list:    Crestor 10 Mg Tabs (Rosuvastatin calcium) .[name]... 1 by mouth once daily His updated medication list for this problem  includes:    Niacin Cr 500 Mg Cr-tabs (Niacin) ..... 1500mg  by mouth once daily    Lovaza 1 Gm Caps (Omega-3-acid ethyl esters) .[name]... 2 by mouth two times a day for lipids  Problem # 7:  LEG PAIN (ICD-729.5) R Assessment: New See "Patient Instructions".  Orders: Sports Medicine (Sports Med) Dr Darrick Penna  Complete Medication List: 1)  Fish Oil Oil (Fish oil) .... Take 4 by mouth once daily 2)  Vitamin D3 2000 Unit Caps (Cholecalciferol) .[name].. 1 by mouth once daily 3)  Synthroid 25 Mcg Tabs (Levothyroxine sodium) .[name].. 1 by mouth once daily 4)  Viagra 100 Mg Tabs (Sildenafil citrate) .... Use as needed 5)  Niacin Cr 500 Mg Cr-tabs (Niacin) .... 1500mg  by mouth once daily 6)  Triamcinolone Acetonide 0.5 % Crea (Triamcinolone acetonide) .... Use two times a day as needed 7)  Ibuprofen 800 Mg Tabs (Ibuprofen) .... Take by mouth three times daily as needed. 8)  Flonase 50 Mcg/act Susp (Fluticasone propionate) .[name].. 1 spr each nostr qd as needed 9)  Dulera 100-5 Mcg/act Aero (Mometasone furo-formoterol fum) .[name].. 1 inh bid 10)  Lovaza 1 Gm Caps (Omega-3-acid ethyl esters) .... 2 by mouth two times a day for lipids  Patient Instructions: 1)  Please schedule a follow-up appointment in 6 months. 2)  BMP prior to visit, ICD-9: 3)  Lipid Panel prior to visit, ICD-9:272.0  995.20 4)  PSA 596.0 5)  Skin biopsy with me 6)  Go on Youtube (www.youtube.com) and look  up , "Ileopsoas stretch", "IT band stretch" and ". Learn the anatomy and learn the symptoms. Do the stretches - it may help!  Prescriptions: LOVAZA 1 GM CAPS (OMEGA-3-ACID ETHYL ESTERS) 2 by mouth two times a day for lipids  #120 x 12   Entered and Authorized by:   Tresa Garter MD   Signed by:   Tresa Garter MD on 10/27/2010   Method used:   Print then Give to Patient   RxID:   [id number] DULERA 100-5 MCG/ACT AERO (MOMETASONE FURO-FORMOTEROL FUM) 1 inh bid  #1 x 6   Entered and Authorized by:   Tresa Garter MD    Signed by:   Tresa Garter MD on 10/27/2010   Method used:   Print then Give to Patient   RxID:   [id number] FLONASE 50 MCG/ACT SUSP (FLUTICASONE PROPIONATE) 1 spr each nostr qd as needed  #1 x 3   Entered and Authorized by:   Tresa Garter MD   Signed by:   Tresa Garter MD on 10/27/2010   Method used:   Print then Give to Patient   RxID:   [id number]    Orders Added: 1)  Gastroenterology Referral [GI] 2)  Sports Medicine [Sports Med] 3)  Est. Patient 40-64 years [99396] 4)  Est. Patient Level IV [60630]

## 2010-11-14 ENCOUNTER — Ambulatory Visit (INDEPENDENT_AMBULATORY_CARE_PROVIDER_SITE_OTHER): Payer: BC Managed Care – PPO | Admitting: Sports Medicine

## 2010-11-14 ENCOUNTER — Ambulatory Visit
Admission: RE | Admit: 2010-11-14 | Discharge: 2010-11-14 | Disposition: A | Discharge status: Home / Self Care | Payer: BC Managed Care – PPO | Source: Ambulatory Visit | Attending: Sports Medicine | Admitting: Sports Medicine

## 2010-11-14 ENCOUNTER — Other Ambulatory Visit: Payer: Self-pay | Admitting: Sports Medicine

## 2010-11-14 ENCOUNTER — Encounter: Payer: Self-pay | Admitting: Sports Medicine

## 2010-11-14 DIAGNOSIS — M25559 Pain in unspecified hip: Secondary | ICD-10-CM

## 2010-11-14 DIAGNOSIS — R109 Unspecified abdominal pain: Secondary | ICD-10-CM | POA: Insufficient documentation

## 2010-11-17 ENCOUNTER — Ambulatory Visit (INDEPENDENT_AMBULATORY_CARE_PROVIDER_SITE_OTHER): Payer: BC Managed Care – PPO | Admitting: Internal Medicine

## 2010-11-17 ENCOUNTER — Other Ambulatory Visit: Payer: Self-pay | Admitting: Internal Medicine

## 2010-11-17 ENCOUNTER — Encounter: Payer: Self-pay | Admitting: Internal Medicine

## 2010-11-17 DIAGNOSIS — D485 Neoplasm of uncertain behavior of skin: Secondary | ICD-10-CM

## 2010-11-23 NOTE — Assessment & Plan Note (Signed)
Summary: NP,LEG PAIN,[facility] [id number]   Vital Signs:  Patient profile:   65 year old male BP sitting:   110 / 67  Vitals Entered By: Lillia Pauls CMA (November 14, 2010 10:07 AM)  Primary Provider:  Tresa Garter MD   History of Present Illness: Patient has had pain bilaterally in groin - 6 months Evaluated by Dr Posey Rea and Sent here for our opinion  Pain with entire run - First 10 -12 minutes is worst Pain with crunches with knees flexed  MPW - 25 miles  Was running more prior to last 6 months  Rested 1 week - Pain gradually went away - came back immediately after   HX of Piriformis syndrome - September 1 1/2 ago -Numbness and shooting pains at times -Epidural  --Had MRI of lower back - Nerve impingement and arthritis; disk bulging noted all those sxs resolved w rest and these do not seem similar  Comes for evaluation wants to keep up his running not much pain at rest and no night pain  Allergies: 1)  ! Lipitor (Atorvastatin Calcium) 2)  Lipitor  Physical Exam  General:  Well-developed,well-nourished,in no acute distress; alert,appropriate and cooperative throughout examination Msk:  Hip ROM is limited bilat on RT has only 15 deg of IR and 30 ER Left has 20 deg IR and 25 deg ER both tested sitting  lying w 90 deg flex hip and knee he improves ROM to: RT 20 deg IR and 45 deg ER LT 30 Deg IR and 445 deg ER  hip flexion is good w exc stength hip abduction str is good hip adduction strength is painful bilat to testing and is weak possibly 2/2 pain On standing he can do adductino with no pain unless resisted  Back motino is tight w extension at 20 deg  FABER is pretty good bilat  HS tight to flexion bilat   Walking and running gait has tightness with swing of RT hemi pelvis this causes ant shift of ASIS RT very good running form otherwise  leg length equal   Impression & Recommendations:  Problem # 1:  GROIN PAIN (ICD-789.09)  His  updated medication list for this problem includes:    Ibuprofen 800 Mg Tabs (Ibuprofen) .[name]... Three times a day prn    Ibuprofen 800 Mg Tabs (Ibuprofen) .[name]... Take by mouth three times daily as needed.  Orders: Radiology other (Radiology Other)   May continue to use ibuprofen as needed for sxs relief and to run races  AP pelvis checked today and he really does not have much DJD of either hip joint - both have good space and good cartilage appearance  has bilat adductor weakness and needs strenght work w this  given hip series of exercises work these daily OK to run but keep mileage same - don't increase yet  reck p 6 weeks to see if improves  Complete Medication List: 1)  Fish Oil Oil (Fish oil) .... 4 per day 2)  Vitamin D3 2000 Unit Caps (Cholecalciferol) .[name].. 1 by mouth qd 3)  Synthroid 25 Mcg Tabs (Levothyroxine sodium) .... Qd 4)  Viagra 100 Mg Tabs (Sildenafil citrate) .... Use prn 5)  Gnp Niacin Tr 500 Mg Tbcr (Niacin) .[name].. 1 po qod 6)  Triamcinolone 0.5% Cream in Eucerin Lotion 1:10  .... Use two times a day prn 7)  Ibuprofen 800 Mg Tabs (Ibuprofen) .... Three times a day prn 8)  Triamcinolone Acetonide 0.5 % Crea (Triamcinolone acetonide) .... Use two  times a day prn 9)  Crestor 10 Mg Tabs (Rosuvastatin calcium) .[name].. 1 by mouth once daily for cholesterol 10)  Fish Oil Oil (Fish oil) .... Take 4 by mouth once daily 11)  Vitamin D3 2000 Unit Caps (Cholecalciferol) .[name].. 1 by mouth once daily 12)  Synthroid 25 Mcg Tabs (Levothyroxine sodium) .[name].. 1 by mouth once daily 13)  Viagra 100 Mg Tabs (Sildenafil citrate) .... Use as needed 14)  Niacin Cr 500 Mg Cr-tabs (Niacin) .... 1500mg  by mouth once daily 15)  Triamcinolone Acetonide 0.5 % Crea (Triamcinolone acetonide) .... Use two times a day as needed 16)  Ibuprofen 800 Mg Tabs (Ibuprofen) .... Take by mouth three times daily as needed. 17)  Flonase 50 Mcg/act Susp (Fluticasone propionate) .[name].. 1 spr each nostr qd as  needed 18)  Dulera 100-5 Mcg/act Aero (Mometasone furo-formoterol fum) .[name].. 1 inh bid 19)  Lovaza 1 Gm Caps (Omega-3-acid ethyl esters) .... 2 by mouth two times a day for lipids   Orders Added: 1)  Radiology other [Radiology Other] 2)  Consultation Level II [16109]

## 2010-11-23 NOTE — Letter (Signed)
Summary: Generic Letter  [facility]  [address]   [place], [place] [address]   Phone: [phone]  Fax: [phone]    11/14/2010 Sonda Primes, MD [facility]  Dear [name]:  thank you for letting me see your patient:   Richard Ellis [address] [place], [place]  [address]  [place]  Richard Ellis has significant limitaion of motion and strength in both hips.  Fortunately on XRay this does not look to be arthritis.  Likely issues are muscle imbalance and chronic adductor tendon strains.  I have started him on home exercise program and will follow to see if he improves.       Sincerely,  Vincent Gros MD

## 2010-11-23 NOTE — Assessment & Plan Note (Signed)
Summary: SKIN BX/[facility]  #   Vital Signs:  Patient profile:   65 year old male Height:      73 inches Weight:      184 pounds BMI:     24.36 Temp:     98.7 degrees F oral Pulse rate:   68 / minute Pulse rhythm:   regular Resp:     16 per minute BP sitting:   100 / 68  (left arm) Cuff size:   regular  Vitals Entered By: Lanier Prude, CMA(AAMA) (November 17, 2010 9:59 AM)  Procedure Note  Mole Biopsy/Removal: The patient complains of changing mole. Indication: suspicious lesion Consent signed: yes  Procedure # 1: shave biopsy    Size (in cm): 0.6 x 0.3    Region: medial    Location: R thor back    Comment: Risks including but not limited by incomplete procedure, bleeding, infection, recurrence were discussed with the patient. Consent form was signed. Tolerated well. Complicatons - none.     Instrument used: dermablade    Anesthesia: 0.5 ml 1% lidocaine w/epinephrine  Procedure # 2: shave biopsy    Size (in cm): 0.3 x 0.2    Region: medial    Location: R buttock    Instrument used: same    Anesthesia: 0.5 ml 1% lidocaine w/epinephrine  Cleaned and prepped with: alcohol and betadine Wound dressing: neosporin and bandaid Instructions: daily dressing changes  CC: Multiple skin bx Comments Pt is not taking Fish oil, GNP Niacin, Crestor, Vit D, Synthroid, Viagra, Triamcinolone or Ibuprofen   Primary Care Provider:  Tresa Garter MD  CC:  Multiple skin bx.  History of Present Illness: He is here for a skin biopsy   Current Medications (verified): 1)  Fish Oil   Oil (Fish Oil) .... 4 Per Day 2)  Vitamin D3 2000 Unit Caps (Cholecalciferol) .[name].. 1 By Mouth Qd 3)  Synthroid 25 Mcg Tabs (Levothyroxine Sodium) .... Qd 4)  Viagra 100 Mg Tabs (Sildenafil Citrate) .... Use Prn 5)  Gnp Niacin Tr 500 Mg  Tbcr (Niacin) .[name].. 1 Po Qod 6)  Triamcinolone 0.5% Cream in Eucerin Lotion 1:10 .... Use Two Times A Day Prn 7)  Ibuprofen 800 Mg Tabs (Ibuprofen) .... Three Times A Day  Prn 8)  Triamcinolone Acetonide 0.5 % Crea (Triamcinolone Acetonide) .... Use Two Times A Day Prn 9)  Crestor 10 Mg Tabs (Rosuvastatin Calcium) .[name].. 1 By Mouth Once Daily For Cholesterol 10)  Fish Oil  Oil (Fish Oil) .... Take 4 By Mouth Once Daily 11)  Vitamin D3 2000 Unit Caps (Cholecalciferol) .[name].. 1 By Mouth Once Daily 12)  Synthroid 25 Mcg Tabs (Levothyroxine Sodium) .[name].. 1 By Mouth Once Daily 13)  Viagra 100 Mg Tabs (Sildenafil Citrate) .... Use As Needed 14)  Niacin Cr 500 Mg Cr-Tabs (Niacin) .... 1500mg  By Mouth Once Daily 15)  Triamcinolone Acetonide 0.5 % Crea (Triamcinolone Acetonide) .... Use Two Times A Day As Needed 16)  Ibuprofen 800 Mg Tabs (Ibuprofen) .... Take By Mouth Three Times Daily As Needed. 17)  Flonase 50 Mcg/act Susp (Fluticasone Propionate) .[name].. 1 Spr Each Nostr Qd As Needed 18)  Dulera 100-5 Mcg/act Aero (Mometasone Furo-Formoterol Fum) .[name].. 1 Inh Bid 19)  Lovaza 1 Gm Caps (Omega-3-Acid Ethyl Esters) .... 2 By Mouth Two Times A Day For Lipids  Allergies (verified): 1)  ! Lipitor (Atorvastatin Calcium) 2)  Lipitor   Impression & Recommendations:  Problem # 1:  NEOPLASM OF UNCERTAIN BEHAVIOR OF  SKIN (ICD-238.2) Assessment Deteriorated  Orders: Shave Skin Lesion 0.6-1.0 cm/trunk/arm/leg (11301) Shave Skin Lesion < 0.5 cm/trunk/arm/leg (11300)  Complete Medication List: 1)  Fish Oil Oil (Fish oil) .... 4 per day 2)  Vitamin D3 2000 Unit Caps (Cholecalciferol) .[name].. 1 by mouth qd 3)  Synthroid 25 Mcg Tabs (Levothyroxine sodium) .... Qd 4)  Viagra 100 Mg Tabs (Sildenafil citrate) .... Use prn 5)  Gnp Niacin Tr 500 Mg Tbcr (Niacin) .[name].. 1 po qod 6)  Triamcinolone 0.5% Cream in Eucerin Lotion 1:10  .... Use two times a day prn 7)  Ibuprofen 800 Mg Tabs (Ibuprofen) .... Three times a day prn 8)  Triamcinolone Acetonide 0.5 % Crea (Triamcinolone acetonide) .... Use two times a day prn 9)  Crestor 10 Mg Tabs (Rosuvastatin calcium) .[name].. 1 by mouth once daily for  cholesterol 10)  Fish Oil Oil (Fish oil) .... Take 4 by mouth once daily 11)  Vitamin D3 2000 Unit Caps (Cholecalciferol) .[name].. 1 by mouth once daily 12)  Synthroid 25 Mcg Tabs (Levothyroxine sodium) .[name].. 1 by mouth once daily 13)  Viagra 100 Mg Tabs (Sildenafil citrate) .... Use as needed 14)  Niacin Cr 500 Mg Cr-tabs (Niacin) .... 1500mg  by mouth once daily 15)  Triamcinolone Acetonide 0.5 % Crea (Triamcinolone acetonide) .... Use two times a day as needed 16)  Ibuprofen 800 Mg Tabs (Ibuprofen) .... Take by mouth three times daily as needed. 17)  Flonase 50 Mcg/act Susp (Fluticasone propionate) .[name].. 1 spr each nostr qd as needed 18)  Dulera 100-5 Mcg/act Aero (Mometasone furo-formoterol fum) .[name].. 1 inh bid 19)  Lovaza 1 Gm Caps (Omega-3-acid ethyl esters) .... 2 by mouth two times a day for lipids   Orders Added: 1)  Shave Skin Lesion 0.6-1.0 cm/trunk/arm/leg [11301] 2)  Shave Skin Lesion < 0.5 cm/trunk/arm/leg [11300]

## 2010-11-27 ENCOUNTER — Ambulatory Visit: Payer: BC Managed Care – PPO | Admitting: Sports Medicine

## 2010-11-28 NOTE — Miscellaneous (Signed)
Summary: Consent for BX / [place]  Consent for BX / [place]   Imported By: Lennie Odor 11/20/2010 11:31:50  _____________________________________________________________________  External Attachment:    Type:   Image     Comment:   External Document

## 2010-11-29 ENCOUNTER — Ambulatory Visit: Payer: BC Managed Care – PPO | Admitting: Sports Medicine

## 2010-12-25 ENCOUNTER — Other Ambulatory Visit: Payer: Self-pay | Admitting: Internal Medicine

## 2010-12-27 ENCOUNTER — Ambulatory Visit: Payer: BC Managed Care – PPO | Admitting: Sports Medicine

## 2011-01-23 ENCOUNTER — Ambulatory Visit: Payer: BC Managed Care – PPO | Admitting: Internal Medicine

## 2011-01-24 ENCOUNTER — Encounter: Payer: Self-pay | Admitting: Internal Medicine

## 2011-01-26 ENCOUNTER — Encounter: Payer: Self-pay | Admitting: Internal Medicine

## 2011-01-26 ENCOUNTER — Ambulatory Visit (INDEPENDENT_AMBULATORY_CARE_PROVIDER_SITE_OTHER): Payer: BC Managed Care – PPO | Admitting: Internal Medicine

## 2011-01-26 VITALS — BP 110/60 | HR 68 | Temp 97.9°F | Resp 16 | Ht 73.5 in | Wt 184.0 lb

## 2011-01-26 DIAGNOSIS — Z0000 Encounter for general adult medical examination without abnormal findings: Secondary | ICD-10-CM

## 2011-01-26 DIAGNOSIS — D485 Neoplasm of uncertain behavior of skin: Secondary | ICD-10-CM

## 2011-01-26 DIAGNOSIS — M79609 Pain in unspecified limb: Secondary | ICD-10-CM

## 2011-01-26 NOTE — Progress Notes (Signed)
  Subjective:    Patient ID: Richard Ellis, male    DOB: Dec 07, 1945, 65 y.o.   MRN: [id number]  HPI  F/u mole removal scars, groin pain  Review of Systems  Constitutional: Negative for fatigue.  Musculoskeletal: Negative for myalgias, arthralgias and gait problem.  Skin: Negative for color change, rash and wound.  Psychiatric/Behavioral: The patient is not nervous/anxious.        Objective:   Physical Exam  Constitutional: He is oriented to person, place, and time. He appears well-developed.  HENT:  Mouth/Throat: Oropharynx is clear and moist.  Eyes: Conjunctivae are normal. Pupils are equal, round, and reactive to light.  Neck: Normal range of motion. No JVD present. No thyromegaly present.  Cardiovascular: Normal rate, regular rhythm, normal heart sounds and intact distal pulses.  Exam reveals no gallop and no friction rub.   No murmur heard. Pulmonary/Chest: Effort normal and breath sounds normal. No respiratory distress. He has no wheezes. He has no rales. He exhibits no tenderness.  Abdominal: Soft. Bowel sounds are normal. He exhibits no distension and no mass. There is no tenderness. There is no rebound and no guarding.  Musculoskeletal: Normal range of motion. He exhibits no edema and no tenderness.  Lymphadenopathy:    He has no cervical adenopathy.  Neurological: He is alert and oriented to person, place, and time. He has normal reflexes. No cranial nerve deficit. He exhibits normal muscle tone. Coordination normal.  Skin: Skin is warm and dry. No rash noted.       Scars w/o color dots R chest 3-4 mm mole  Psychiatric: He has a normal mood and affect. His behavior is normal. Judgment and thought content normal.   R lat chest wall  Small but dark mole       Assessment & Plan:

## 2011-01-30 ENCOUNTER — Encounter: Payer: Self-pay | Admitting: Internal Medicine

## 2011-01-30 NOTE — Assessment & Plan Note (Signed)
Scars look well. I advised R chest wall mole removal

## 2011-01-30 NOTE — Assessment & Plan Note (Signed)
Resolved

## 2011-04-05 ENCOUNTER — Ambulatory Visit (INDEPENDENT_AMBULATORY_CARE_PROVIDER_SITE_OTHER): Payer: BC Managed Care – PPO | Admitting: Sports Medicine

## 2011-04-05 ENCOUNTER — Encounter: Payer: Self-pay | Admitting: Sports Medicine

## 2011-04-05 VITALS — BP 119/76 | HR 65 | Ht 73.5 in | Wt 177.0 lb

## 2011-04-05 DIAGNOSIS — R109 Unspecified abdominal pain: Secondary | ICD-10-CM

## 2011-04-05 DIAGNOSIS — K4020 Bilateral inguinal hernia, without obstruction or gangrene, not specified as recurrent: Secondary | ICD-10-CM

## 2011-04-05 NOTE — Progress Notes (Signed)
Subjective:    Patient ID: Richard Ellis, male    DOB: Jan 11, 1946, 65 y.o.   MRN: [id number]  HPI  Richard Ellis is a pleasant 65 year old male patient comes in today complaining of left groin pain for the last 6 months. He denies any injury. He states that the pain developed progressively. His adult pain, localized in his left groin area, 3/10 intensity, no radiating, worsened by activities like running, doing abdominal crunches. Improve by resting. She states that when he was 65 years old while he was lifting a heavy object he felt a pain in the same area similar to the same pain that he is presenting right now. At that time the pain went away on its own and it never bothered him again. He denies any hip pain. He denies any numbness or tingling radiated to his left lower extremity. He is going to be doing some rafting and hiking and he is concerned with the groin pain and his activities doing the trip.   Past Medical History  Diagnosis Date  . Hyperlipidemia   . Osteoarthritis   . Allergy     rhinitis     Current Outpatient Prescriptions on File Prior to Visit  Medication Sig Dispense Refill  . Cholecalciferol 2000 UNITS CAPS Take 1 capsule by mouth daily.        . fish oil-omega-3 fatty acids 1000 MG capsule Take by mouth. Take 4 daily       . fluticasone (FLONASE) 50 MCG/ACT nasal spray 1 spray by Nasal route as needed.        [name] levothyroxine (SYNTHROID, LEVOTHROID) 25 MCG tablet TAKE ONE TABLET BY MOUTH ONE TIME DAILY  30 tablet  5  . Mometasone Furo-Formoterol Fum (DULERA) 100-5 MCG/ACT AERO Inhale into the lungs 2 (two) times daily.        . niacin 500 MG tablet Take 1,500 mg by mouth daily with breakfast.        . sildenafil (VIAGRA) 100 MG tablet Take 100 mg by mouth daily as needed.        . triamcinolone (KENALOG) 0.5 % cream Apply topically 2 (two) times daily as needed.          Allergies  Allergen Reactions  . Atorvastatin Other (See Comments)    Muscle weakness     History   Social History  . Marital Status: Married    Spouse Name: N/A    Number of Children: N/A  . Years of Education: N/A   Occupational History  . Not on file.   Social History Main Topics  . Smoking status: Never Smoker   . Smokeless tobacco: Never Used  . Alcohol Use: No  . Drug Use: Not on file  . Sexually Active: Not on file   Other Topics Concern  . Not on file   Social History Narrative  . No narrative on file      Review of Systems  Constitutional: Negative for fever, chills, diaphoresis and fatigue.  Gastrointestinal: Negative for nausea, abdominal pain, abdominal distention and anal bleeding.  Musculoskeletal:       Groin pain with history of present illness       Objective:   Physical Exam  Constitutional: He appears well-developed and well-nourished.       BP 119/76  Pulse 65  Ht 6' 1.5" (1.867 m)  Wt 177 lb (80.287 kg)  BMI 23.04 kg/m2   Abdominal: Soft. Bowel sounds are normal. He exhibits no distension and no  mass. There is no tenderness. There is no rebound and no guarding.  Genitourinary:       Right and left inguinal canal examination positive for B/L inguinal hernia. No testicular mass or swelling.  Neurological: He is alert.  Skin: No rash noted.  Psychiatric: He has a normal mood and affect. His behavior is normal.     Assessment & Plan:    1. Inguinal hernia bilateral, non-recurrent    We discussed with Richard Ellis the risk of a incarcerated inguinal hernia, the importance of avoiding heavy weight lifting, and modify his activities. Recommended him to use a jock strap for inguinal support. He will need to be evaluated that by a general surgeon. We will get him an appointment with Dr. Ovidio Kin general surgeon for the elevation of his inguinal hernia in further discussion for surgical treatment

## 2011-04-06 ENCOUNTER — Telehealth: Payer: Self-pay | Admitting: *Deleted

## 2011-04-06 NOTE — Telephone Encounter (Signed)
Pt scheduled with Dr. Ovidio Kin for eval of bilat inguinal hernias.  Records faxed to [phone].  Pt notified- he will be calling their office to reschedule the appt because he will be out of town on the date appt is originally scheduled.

## 2011-04-09 NOTE — Assessment & Plan Note (Signed)
Current concern is inguinal hernia and we will refer for surgical evaluation.

## 2011-04-27 ENCOUNTER — Ambulatory Visit (INDEPENDENT_AMBULATORY_CARE_PROVIDER_SITE_OTHER): Payer: Self-pay | Admitting: Surgery

## 2011-04-30 ENCOUNTER — Ambulatory Visit: Payer: BC Managed Care – PPO

## 2011-04-30 DIAGNOSIS — R5383 Other fatigue: Secondary | ICD-10-CM

## 2011-04-30 DIAGNOSIS — R5381 Other malaise: Secondary | ICD-10-CM

## 2011-04-30 DIAGNOSIS — E785 Hyperlipidemia, unspecified: Secondary | ICD-10-CM

## 2011-04-30 DIAGNOSIS — R972 Elevated prostate specific antigen [PSA]: Secondary | ICD-10-CM

## 2011-04-30 LAB — LIPID PANEL
Cholesterol: 306 mg/dL — ABNORMAL HIGH (ref 0–200)
HDL: 59.6 mg/dL (ref >39.00)
Total CHOL/HDL Ratio: 5
Triglycerides: 264 mg/dL — ABNORMAL HIGH (ref 0.0–149.0)
VLDL: 52.8 mg/dL — ABNORMAL HIGH (ref 0.0–40.0)

## 2011-04-30 LAB — BASIC METABOLIC PANEL
BUN: 25 mg/dL — ABNORMAL HIGH (ref 6–23)
CO2: 25 mEq/L (ref 19–32)
Calcium: 9 mg/dL (ref 8.4–10.5)
Chloride: 101 mEq/L (ref 96–112)
Creatinine, Ser: 1 mg/dL (ref 0.4–1.5)
GFR: 77.02 mL/min (ref >60.00)
Glucose, Bld: 104 mg/dL — ABNORMAL HIGH (ref 70–99)
Potassium: 3.8 mEq/L (ref 3.5–5.1)
Sodium: 135 mEq/L (ref 135–145)

## 2011-04-30 LAB — PSA: PSA: 2.67 ng/mL (ref 0.10–4.00)

## 2011-04-30 LAB — LDL CHOLESTEROL, DIRECT: Direct LDL: 176.5 mg/dL

## 2011-05-02 ENCOUNTER — Encounter (INDEPENDENT_AMBULATORY_CARE_PROVIDER_SITE_OTHER): Payer: Self-pay | Admitting: Surgery

## 2011-05-02 ENCOUNTER — Ambulatory Visit (INDEPENDENT_AMBULATORY_CARE_PROVIDER_SITE_OTHER): Payer: BC Managed Care – PPO | Admitting: Surgery

## 2011-05-02 VITALS — BP 142/88 | HR 48 | Temp 96.6°F | Ht 73.5 in | Wt 181.8 lb

## 2011-05-02 DIAGNOSIS — R1032 Left lower quadrant pain: Secondary | ICD-10-CM

## 2011-05-02 NOTE — Progress Notes (Signed)
Chief Complaint  Patient presents with  . Other    new pt- eval [facility]    Richard Ellis is a 65 y.o. (DOB: [birth year])  white male who is a patient of Sonda Primes, MD, MD and comes to me today for possible left inguinal hernia (maybe even bilateral inguinal hernias).  Sometime in his late 30s he felt a tear in his left lower quadrant when he was lifting a heavy object. He has some pain in that area on an off since then. Around 6 months ago, he began noticing some persistent pain in his left groin and left lower quadrant. This seemed to be exacerbated when he exercised hard, particular running.  He runs at a 6 1/2 to 7 minute/mile rate.  He saw Dr. Myles Rosenthal who thought he may have a left inguinal hernia, maybe even bilateral hernias.   He has no history of peptic ulcer disease, liver disease, pancreatic disease, or colon.  He's had a cough (he describes it as bronchitis) since mid June, but this has not worsened his left groin pain.  Past Medical History  Diagnosis Date  . Hyperlipidemia   . Osteoarthritis   . Allergy     rhinitis  . Bronchitis     History reviewed. No pertinent past surgical history.  Current Outpatient Prescriptions  Medication Sig Dispense Refill  . Cholecalciferol 2000 UNITS CAPS Take 1 capsule by mouth daily.        . fluticasone (FLONASE) 50 MCG/ACT nasal spray 1 spray by Nasal route as needed.        [name] levothyroxine (SYNTHROID, LEVOTHROID) 25 MCG tablet TAKE ONE TABLET BY MOUTH ONE TIME DAILY  30 tablet  5  . Mometasone Furo-Formoterol Fum (DULERA) 100-5 MCG/ACT AERO Inhale into the lungs 2 (two) times daily.        . niacin 500 MG tablet Take 1,500 mg by mouth daily with breakfast.        . omega-3 acid ethyl esters (LOVAZA) 1 G capsule Take 2 g by mouth 2 (two) times daily.        . sildenafil (VIAGRA) 100 MG tablet Take 100 mg by mouth daily as needed.        . triamcinolone (KENALOG) 0.5 % cream Apply topically 2 (two) times daily as needed.           Allergies  Allergen Reactions  . Atorvastatin Other (See Comments)    Muscle weakness   Review of Systems: Skin:  No history of rash.  No history of abnormal moles. Infection:  No history of hepatitis or HIV.  No history of MRSA. Neurologic:  No history of stroke.  No history of seizure.  No history of headaches. Cardiac:  No history of hypertension. No history of heart disease.  No history of prior cardiac catheterization.  No history of seeing a cardiologist. Pulmonary:  Does not smoke cigarettes.  He has had bronchitis since he had a cold in June, 2012. He still has a cough.   Endocrine:  He is on thyroid replacement. Gastrointestinal:  See HPI. Urologic:  No history of kidney stones.  No history of bladder infections. Musculoskeletal:  No history of joint or back disease. Hematologic:  No bleeding disorder.  No history of anemia.  Not anticoagulated.  SOCIAL and FAMILY HISTORY: Works as Charity fundraiser for [employer].  PHYSICAL EXAM: BP 142/88  Pulse 48  Temp(Src) 96.6 F (35.9 C) (Temporal)  Ht 6' 1.5" (1.867 m)  Wt 181 lb 12.8  oz (82.464 kg)  BMI 23.66 kg/m2  HEENT: Normal. Pupils equal. Normal dentition. Neck: Supple. No thyroid mass. Lymph Nodes:  No supraclavicular or cervical nodes. Lungs: Clear and symmetric. Heart:  RRR. No murmur.  Abdomen: No mass. No tenderness. Normal bowel sounds.  No abdominal scars.  Standing and supine, I do not feel an obvious inguinal hernia.  I think he has some laxity to his inguinal floor, but his does not require surgery.  He is thin, which should make feeling a hernia straight forward. Rectal: Not done. Extremities:  Good strength in upper and lower extremities. Neurologic:  Grossly intact to motor and sensory function.  DATA REVIEWED: Notes from Dr. Myles Rosenthal office.  ASSESSMENT and PLAN: 1.  Probable left groin strain.  I do not think he has a hernia. I gave him literature about hernias and literature on "groin strain  rehabilitation".  I do not think he needs any addition diagnostic test. See back prn.  2.  Hyperlipidemia. 3.  Thyroid replacement.

## 2011-05-02 NOTE — Patient Instructions (Signed)
Given literature on hernias and groin rehab.

## 2011-05-04 ENCOUNTER — Encounter: Payer: Self-pay | Admitting: Internal Medicine

## 2011-05-04 ENCOUNTER — Ambulatory Visit (INDEPENDENT_AMBULATORY_CARE_PROVIDER_SITE_OTHER): Payer: BC Managed Care – PPO | Admitting: Internal Medicine

## 2011-05-04 DIAGNOSIS — E039 Hypothyroidism, unspecified: Secondary | ICD-10-CM

## 2011-05-04 DIAGNOSIS — E785 Hyperlipidemia, unspecified: Secondary | ICD-10-CM

## 2011-05-04 DIAGNOSIS — J209 Acute bronchitis, unspecified: Secondary | ICD-10-CM

## 2011-05-04 DIAGNOSIS — R21 Rash and other nonspecific skin eruption: Secondary | ICD-10-CM

## 2011-05-04 DIAGNOSIS — R5381 Other malaise: Secondary | ICD-10-CM

## 2011-05-04 DIAGNOSIS — M1990 Unspecified osteoarthritis, unspecified site: Secondary | ICD-10-CM

## 2011-05-04 DIAGNOSIS — R5383 Other fatigue: Secondary | ICD-10-CM

## 2011-05-04 MED ORDER — NIACIN ER (ANTIHYPERLIPIDEMIC) 1000 MG PO TBCR: 1000.0000 mg | EXTENDED_RELEASE_TABLET | Freq: Every day | ORAL | Status: DC

## 2011-05-04 NOTE — Assessment & Plan Note (Signed)
Better  

## 2011-05-04 NOTE — Assessment & Plan Note (Signed)
On prn rx

## 2011-05-04 NOTE — Assessment & Plan Note (Signed)
Labs reviewed. Try Niaspan Rx

## 2011-05-04 NOTE — Assessment & Plan Note (Signed)
On Rx 

## 2011-05-04 NOTE — Assessment & Plan Note (Signed)
Use Dulera x 1-2 wks

## 2011-05-04 NOTE — Progress Notes (Signed)
  Subjective:    Patient ID: Richard Ellis, male    DOB: [birth year], 65 y.o.   MRN: [id number]  HPI  The patient presents for a follow-up of  chronic hypothyroidism, chronic dyslipidemia, elev glu controlled with medicines    Review of Systems  Constitutional: Negative for appetite change, fatigue and unexpected weight change.  HENT: Negative for nosebleeds, congestion, sore throat, sneezing, trouble swallowing and neck pain.   Eyes: Negative for itching and visual disturbance.  Respiratory: Negative for cough.   Cardiovascular: Negative for chest pain, palpitations and leg swelling.  Gastrointestinal: Negative for nausea, diarrhea, blood in stool and abdominal distention.  Genitourinary: Negative for frequency and hematuria.  Musculoskeletal: Negative for back pain, joint swelling and gait problem.  Skin: Negative for rash.  Neurological: Negative for dizziness, tremors, speech difficulty and weakness.  Psychiatric/Behavioral: Negative for sleep disturbance, dysphoric mood and agitation. The patient is not nervous/anxious.        Objective:   Physical Exam  Constitutional: He is oriented to person, place, and time. He appears well-developed.  HENT:  Mouth/Throat: Oropharynx is clear and moist.  Eyes: Conjunctivae are normal. Pupils are equal, round, and reactive to light.  Neck: Normal range of motion. No JVD present. No thyromegaly present.  Cardiovascular: Normal rate, regular rhythm, normal heart sounds and intact distal pulses.  Exam reveals no gallop and no friction rub.   No murmur heard. Pulmonary/Chest: Effort normal and breath sounds normal. No respiratory distress. He has no wheezes. He has no rales. He exhibits no tenderness.  Abdominal: Soft. Bowel sounds are normal. He exhibits no distension and no mass. There is no tenderness. There is no rebound and no guarding.  Musculoskeletal: Normal range of motion. He exhibits no edema and no tenderness.  Lymphadenopathy:    He  has no cervical adenopathy.  Neurological: He is alert and oriented to person, place, and time. He has normal reflexes. No cranial nerve deficit. He exhibits normal muscle tone. Coordination normal.  Skin: Skin is warm and dry. No rash noted.  Psychiatric: He has a normal mood and affect. His behavior is normal. Judgment and thought content normal.     Lab Results  Component Value Date   WBC 4.1* 10/23/2010   HGB 13.2 10/23/2010   HCT 39.2 10/23/2010   PLT 162.0 10/23/2010   CHOL 306* 04/30/2011   TRIG 264.0* 04/30/2011   HDL 59.60 04/30/2011   LDLDIRECT 176.5 04/30/2011   ALT 17 10/23/2010   AST 24 10/23/2010   NA 135 04/30/2011   K 3.8 04/30/2011   CL 101 04/30/2011   CREATININE 1.0 04/30/2011   BUN 25* 04/30/2011   CO2 25 04/30/2011   TSH 2.61 10/23/2010   PSA 2.67 04/30/2011   HGBA1C 5.8 04/02/2008        Assessment & Plan:

## 2011-06-19 ENCOUNTER — Other Ambulatory Visit: Payer: Self-pay | Admitting: Internal Medicine

## 2011-08-08 ENCOUNTER — Ambulatory Visit: Payer: BC Managed Care – PPO | Admitting: Internal Medicine

## 2011-08-08 DIAGNOSIS — Z0289 Encounter for other administrative examinations: Secondary | ICD-10-CM

## 2011-09-06 ENCOUNTER — Ambulatory Visit (INDEPENDENT_AMBULATORY_CARE_PROVIDER_SITE_OTHER): Payer: Federal, State, Local not specified - PPO | Admitting: Internal Medicine

## 2011-09-06 ENCOUNTER — Encounter: Payer: Self-pay | Admitting: Internal Medicine

## 2011-09-06 VITALS — BP 116/60 | HR 52 | Temp 98.1°F | Resp 16 | Wt 182.0 lb

## 2011-09-06 DIAGNOSIS — J209 Acute bronchitis, unspecified: Secondary | ICD-10-CM

## 2011-09-06 MED ORDER — MOXIFLOXACIN HCL 400 MG PO TABS: 400.0000 mg | ORAL_TABLET | Freq: Every day | ORAL | Status: AC

## 2011-09-06 MED ORDER — PSEUDOEPH-CHLORPHEN-HYDROCOD 60-4-5 MG/5ML PO SOLN: 5.0000 mL | Freq: Four times a day (QID) | ORAL | Status: DC | PRN

## 2011-09-06 NOTE — Progress Notes (Signed)
  Subjective:    Patient ID: Richard Ellis, male    DOB: 1945-11-29, 65 y.o.   MRN: [id number]  Cough This is a new problem. The current episode started in the past 7 days. The problem has been unchanged. The problem occurs every few hours. The cough is productive of purulent sputum. Associated symptoms include nasal congestion and a sore throat. Pertinent negatives include no chest pain, chills, ear congestion, ear pain, fever, headaches, heartburn, hemoptysis, myalgias, postnasal drip, rash, rhinorrhea, shortness of breath, sweats, weight loss or wheezing. The symptoms are aggravated by nothing. He has tried OTC cough suppressant, steroid inhaler and a beta-agonist inhaler for the symptoms. The treatment provided no relief. His past medical history is significant for asthma.      Review of Systems  Constitutional: Positive for fatigue. Negative for fever, chills, weight loss, diaphoresis, activity change, appetite change and unexpected weight change.  HENT: Positive for sore throat. Negative for ear pain, congestion, rhinorrhea, sneezing, trouble swallowing, voice change, postnasal drip and sinus pressure.   Eyes: Negative.   Respiratory: Positive for cough. Negative for hemoptysis, choking, shortness of breath, wheezing and stridor.   Cardiovascular: Negative for chest pain, palpitations and leg swelling.  Gastrointestinal: Negative for heartburn, nausea, vomiting, abdominal pain, diarrhea, constipation and blood in stool.  Genitourinary: Negative.   Musculoskeletal: Negative for myalgias, back pain, joint swelling, arthralgias and gait problem.  Skin: Negative for color change, pallor, rash and wound.  Neurological: Negative for dizziness, tremors, seizures, syncope, facial asymmetry, speech difficulty, weakness, light-headedness, numbness and headaches.  Hematological: Negative for adenopathy. Does not bruise/bleed easily.  Psychiatric/Behavioral: Negative.        Objective:   Physical  Exam  Vitals reviewed. Constitutional: He is oriented to person, place, and time. He appears well-developed and well-nourished. No distress.  HENT:  Head: Normocephalic and atraumatic.  Mouth/Throat: Oropharynx is clear and moist. No oropharyngeal exudate.  Eyes: Conjunctivae are normal. Right eye exhibits no discharge. Left eye exhibits no discharge. No scleral icterus.  Neck: Normal range of motion. Neck supple. No JVD present. No tracheal deviation present. No thyromegaly present.  Cardiovascular: Normal rate, regular rhythm, normal heart sounds and intact distal pulses.  Exam reveals no gallop and no friction rub.   No murmur heard. Pulmonary/Chest: Effort normal and breath sounds normal. No stridor. No respiratory distress. He has no wheezes. He has no rales. He exhibits no tenderness.  Abdominal: Soft. Bowel sounds are normal. He exhibits no distension and no mass. There is no tenderness. There is no rebound and no guarding.  Musculoskeletal: Normal range of motion. He exhibits no edema and no tenderness.  Lymphadenopathy:    He has no cervical adenopathy.  Neurological: He is oriented to person, place, and time.  Skin: Skin is warm and dry. No rash noted. He is not diaphoretic. No erythema. No pallor.  Psychiatric: He has a normal mood and affect. His behavior is normal. Judgment and thought content normal.          Assessment & Plan:

## 2011-09-06 NOTE — Patient Instructions (Signed)

## 2011-09-06 NOTE — Assessment & Plan Note (Signed)
Start avelox for the infection and zutripro for the cough 

## 2011-10-23 ENCOUNTER — Other Ambulatory Visit: Payer: Self-pay | Admitting: Internal Medicine

## 2011-11-06 ENCOUNTER — Encounter: Payer: Self-pay | Admitting: Internal Medicine

## 2011-11-06 ENCOUNTER — Ambulatory Visit (INDEPENDENT_AMBULATORY_CARE_PROVIDER_SITE_OTHER): Payer: Federal, State, Local not specified - PPO | Admitting: Internal Medicine

## 2011-11-06 VITALS — BP 110/68 | HR 45 | Temp 97.7°F | Wt 178.0 lb

## 2011-11-06 DIAGNOSIS — M778 Other enthesopathies, not elsewhere classified: Secondary | ICD-10-CM

## 2011-11-06 DIAGNOSIS — M67919 Unspecified disorder of synovium and tendon, unspecified shoulder: Secondary | ICD-10-CM

## 2011-11-06 DIAGNOSIS — IMO0002 Reserved for concepts with insufficient information to code with codable children: Secondary | ICD-10-CM

## 2011-11-06 DIAGNOSIS — H8110 Benign paroxysmal vertigo, unspecified ear: Secondary | ICD-10-CM

## 2011-11-06 DIAGNOSIS — M719 Bursopathy, unspecified: Secondary | ICD-10-CM

## 2011-11-06 MED ORDER — MECLIZINE HCL 25 MG PO TABS: 25.0000 mg | ORAL_TABLET | Freq: Three times a day (TID) | ORAL | Status: AC | PRN

## 2011-11-06 MED ORDER — CYCLOBENZAPRINE HCL 10 MG PO TABS: 10.0000 mg | ORAL_TABLET | Freq: Three times a day (TID) | ORAL | Status: AC | PRN

## 2011-11-06 NOTE — Patient Instructions (Addendum)
It was good to see you today. Use meclizine 3 times a day half to whole tablet as needed for dizziness symptoms, may cause sedation Use Flexeril 3 times a day as needed for muscle tenderness and spasm, may cause sedation Your prescription(s) have been submitted to your pharmacy. Please take as directed and contact our office if you believe you are having problem(s) with the medication(s). Use over-the-counter ibuprofen 400 600 mg 3 times daily for next 2-3 days, then as needed for shoulder pain symptoms Call if symptoms worse or unimproved for reevaluation as needed Please schedule followup with primary care physician in the next 2-3 months for labs and other review as neededBenign Positional Vertigo Vertigo is a feeling that you are unsteady or that you or your surroundings are moving. Benign positional vertigo (BPV) is the most common form of vertigo. Benign means it does not have a serious cause. BPV is an upset in the balance system in your middle ear. This is troublesome but usually not serious. A viral infection or head injury are common causes, but often no cause is found. It is more common as we grow older. SYMPTOMS   Sudden dizziness happens when you move your head in different directions. Some of the problems that come with this are:  Loss of balance.     Throwing up (vomiting).     Blurred vision.     Dizziness .     Feeling sick to your stomach (nauseated).  DIAGNOSIS   Your caregiver may do some specialized testing to prove what is wrong. HOME CARE INSTRUCTIONS    Rest and eat a well-balanced diet.     Move slowly and do not make sudden body or head movements.     Do not drive a car or do any activities that could hurt you or others.     Lie down and rest. Take precautions to prevent falls.  SEEK IMMEDIATE MEDICAL CARE IF:    You develop headaches which are severe or lasting.     You develop continued vomiting.     A temporary loss or change of vision appears.      You notice temporary numbness on one side of your body.     You are temporarily unable to speak.     Temporary areas of weakness develop.     You have weakness or numbness in the face, arms, or legs.     You notice dizziness or difficulty walking.     You experience slurred speech or difficulty swallowing.  MAKE SURE YOU:    Understand these instructions.     Will watch your condition.     Will get help right away if you are not doing well or get worse.  Document Released: 06/11/2006 Document Revised: 03/19/2011 Document Reviewed: 06/20/2006 [facility] Patient Information 2012 [place], [place].

## 2011-11-06 NOTE — Progress Notes (Signed)
  Subjective:    Patient ID: Richard Ellis, male    DOB: 08-08-1946, 66 y.o.   MRN: [id number]  HPI complains of dizzy symptoms  Onset 2 days ago upon waking up Sunday morning Symptoms precipitated by rising from supine position and turning over in bed No headache, no vision change, no head trauma Mild sneezing but no sinus pressure or ear symptoms  Past Medical History  Diagnosis Date  . Hyperlipidemia   . Osteoarthritis   . Allergy     rhinitis  . Bronchitis      Review of Systems  Constitutional: Negative for fever and unexpected weight change.  HENT: Negative for hearing loss and ear pain.   Neurological: Negative for syncope and headaches.       Objective:   Physical Exam BP 110/68  Pulse 45  Temp(Src) 97.7 F (36.5 C) (Oral)  Wt 178 lb (80.74 kg)  SpO2 97% Wt Readings from Last 3 Encounters:  11/06/11 178 lb (80.74 kg)  09/06/11 182 lb (82.555 kg)  05/04/11 180 lb (81.647 kg)   Constitutional:  He appears well-developed and well-nourished. No distress.  HENT: Normocephalic atraumatic, sinuses nontender. Tympanic membranes bilaterally clear without erythema or effusion. Oropharynx without erythema or exudate Eyes: No nystagmus. No icterus sclerae. PERRLA, vision grossly intact Neck: Normal range of motion. Neck supple. No JVD present. No thyromegaly present.  Cardiovascular: Normal rate, regular rhythm and normal heart sounds.  No murmur heard. no BLE edema Pulmonary/Chest: Effort normal and breath sounds normal. No respiratory distress. no wheezes.  Neurological: he is alert and oriented to person, place, and time. No cranial nerve deficit. Coordination normal.  Musculoskeletal: R shoulder: No obvious deformities. Normal range of motion. Good strength with stressing of rotator cuff. Good internal rotation and abduction. mild impingement signs. Min pain elicited with overhead activities. Neuro vascularly intact distally. Tender to palpation over inner scapula  (trigger point spasm) Skin: Skin is warm and dry.  No erythema or ulceration.  Psychiatric: he has a normal mood and affect. behavior is normal. Judgment and thought content normal.   Lab Results  Component Value Date   WBC 4.1* 10/23/2010   HGB 13.2 10/23/2010   HCT 39.2 10/23/2010   PLT 162.0 10/23/2010   GLUCOSE 104* 04/30/2011   CHOL 306* 04/30/2011   TRIG 264.0* 04/30/2011   HDL 59.60 04/30/2011   LDLDIRECT 176.5 04/30/2011   ALT 17 10/23/2010   AST 24 10/23/2010   NA 135 04/30/2011   K 3.8 04/30/2011   CL 101 04/30/2011   CREATININE 1.0 04/30/2011   BUN 25* 04/30/2011   CO2 25 04/30/2011   TSH 2.61 10/23/2010   PSA 2.67 04/30/2011   HGBA1C 5.8 04/02/2008        Assessment & Plan:   Positional vertigo - ENT and neuro exam benign - education provided, meclizine when necessary. Patient to call if symptoms worse or unimproved consider vestibular rehabilitation as needed  Right shoulder tendinitis, precipitated by overuse. Device over-the-counter NSAIDs ibuprofen for 6 mg 3 times a day x72 hours, then as needed. Also Flexeril when necessary associated trapezius muscle spasm - by something effects, especially in coordination with meclizine use as indicated above

## 2011-11-16 ENCOUNTER — Other Ambulatory Visit: Payer: Self-pay | Admitting: Internal Medicine

## 2012-01-10 ENCOUNTER — Other Ambulatory Visit: Payer: Self-pay | Admitting: Internal Medicine

## 2012-01-10 NOTE — Telephone Encounter (Signed)
It would be OTC fish oil - no other Rx alternatives to Lovasa are avail Thx

## 2012-01-10 NOTE — Telephone Encounter (Signed)
Pt says lovaza it too expensive.  Pt requesting a cheaper med  Pt ph# [phone]---Please call pt--pt do not know pharm name---

## 2012-01-21 NOTE — Telephone Encounter (Signed)
OK Lovasa #360 with 3 ref Thx

## 2012-01-21 NOTE — Telephone Encounter (Signed)
PT WANTS RX FOR LOVAZA TO MAIN IN TO [facility]IL ORDER [facility] WHICH WILL SAVE HIM MONEY.  HE WILL COME IN TO PICK UP.  HE NEEDS THE 90 DAY WITH REFILLS.  PLEASE CALL HIM [phone]

## 2012-01-24 MED ORDER — OMEGA-3-ACID ETHYL ESTERS 1 G PO CAPS: 2.0000 g | ORAL_CAPSULE | Freq: Two times a day (BID) | ORAL | Status: DC

## 2012-01-24 NOTE — Telephone Encounter (Signed)
Rx printed/signed/mailed to pt per pt's request.

## 2012-02-05 ENCOUNTER — Other Ambulatory Visit: Payer: Self-pay | Admitting: *Deleted

## 2012-02-05 ENCOUNTER — Other Ambulatory Visit (INDEPENDENT_AMBULATORY_CARE_PROVIDER_SITE_OTHER): Payer: Medicare Other

## 2012-02-05 DIAGNOSIS — E039 Hypothyroidism, unspecified: Secondary | ICD-10-CM

## 2012-02-05 DIAGNOSIS — R799 Abnormal finding of blood chemistry, unspecified: Secondary | ICD-10-CM

## 2012-02-05 DIAGNOSIS — E785 Hyperlipidemia, unspecified: Secondary | ICD-10-CM

## 2012-02-05 LAB — CBC WITH DIFFERENTIAL/PLATELET
Basophils Absolute: 0 10*3/uL (ref 0.0–0.1)
Basophils Relative: 0.7 % (ref 0.0–3.0)
Eosinophils Absolute: 0.1 10*3/uL (ref 0.0–0.7)
Eosinophils Relative: 2.8 % (ref 0.0–5.0)
HCT: 38 % — ABNORMAL LOW (ref 39.0–52.0)
Hemoglobin: 12.7 g/dL — ABNORMAL LOW (ref 13.0–17.0)
Lymphocytes Relative: 30.5 % (ref 12.0–46.0)
Lymphs Abs: 1.3 10*3/uL (ref 0.7–4.0)
MCHC: 33.4 g/dL (ref 30.0–36.0)
MCV: 96.2 fl (ref 78.0–100.0)
Monocytes Absolute: 0.5 10*3/uL (ref 0.1–1.0)
Monocytes Relative: 10.7 % (ref 3.0–12.0)
Neutro Abs: 2.4 10*3/uL (ref 1.4–7.7)
Neutrophils Relative %: 55.3 % (ref 43.0–77.0)
Platelets: 164 10*3/uL (ref 150.0–400.0)
RBC: 3.96 Mil/uL — ABNORMAL LOW (ref 4.22–5.81)
RDW: 13.1 % (ref 11.5–14.6)
WBC: 4.4 10*3/uL — ABNORMAL LOW (ref 4.5–10.5)

## 2012-02-05 LAB — COMPREHENSIVE METABOLIC PANEL
ALT: 30 U/L (ref 0–53)
AST: 32 U/L (ref 0–37)
Albumin: 3.9 g/dL (ref 3.5–5.2)
Alkaline Phosphatase: 42 U/L (ref 39–117)
BUN: 20 mg/dL (ref 6–23)
CO2: 26 mEq/L (ref 19–32)
Calcium: 9.2 mg/dL (ref 8.4–10.5)
Chloride: 105 mEq/L (ref 96–112)
Creatinine, Ser: 1 mg/dL (ref 0.4–1.5)
GFR: 78.59 mL/min (ref >60.00)
Glucose, Bld: 93 mg/dL (ref 70–99)
Potassium: 3.9 mEq/L (ref 3.5–5.1)
Sodium: 138 mEq/L (ref 135–145)
Total Bilirubin: 0.8 mg/dL (ref 0.3–1.2)
Total Protein: 6.6 g/dL (ref 6.0–8.3)

## 2012-02-05 LAB — TSH: TSH: 2.31 u[IU]/mL (ref 0.35–5.50)

## 2012-02-05 LAB — LIPID PANEL
Cholesterol: 250 mg/dL — ABNORMAL HIGH (ref 0–200)
HDL: 51.4 mg/dL (ref >39.00)
Total CHOL/HDL Ratio: 5
Triglycerides: 240 mg/dL — ABNORMAL HIGH (ref 0.0–149.0)
VLDL: 48 mg/dL — ABNORMAL HIGH (ref 0.0–40.0)

## 2012-02-05 LAB — LDL CHOLESTEROL, DIRECT: Direct LDL: 147.9 mg/dL

## 2012-02-07 ENCOUNTER — Encounter: Payer: Self-pay | Admitting: Internal Medicine

## 2012-02-07 ENCOUNTER — Telehealth: Payer: Self-pay | Admitting: Internal Medicine

## 2012-02-07 ENCOUNTER — Ambulatory Visit (INDEPENDENT_AMBULATORY_CARE_PROVIDER_SITE_OTHER): Payer: Medicare Other | Admitting: Internal Medicine

## 2012-02-07 VITALS — BP 130/80 | HR 60 | Temp 98.3°F | Resp 16 | Wt 179.0 lb

## 2012-02-07 DIAGNOSIS — J45909 Unspecified asthma, uncomplicated: Secondary | ICD-10-CM

## 2012-02-07 DIAGNOSIS — N320 Bladder-neck obstruction: Secondary | ICD-10-CM

## 2012-02-07 DIAGNOSIS — E785 Hyperlipidemia, unspecified: Secondary | ICD-10-CM

## 2012-02-07 DIAGNOSIS — M1990 Unspecified osteoarthritis, unspecified site: Secondary | ICD-10-CM

## 2012-02-07 DIAGNOSIS — Z1211 Encounter for screening for malignant neoplasm of colon: Secondary | ICD-10-CM

## 2012-02-07 DIAGNOSIS — E039 Hypothyroidism, unspecified: Secondary | ICD-10-CM

## 2012-02-07 DIAGNOSIS — R5383 Other fatigue: Secondary | ICD-10-CM

## 2012-02-07 DIAGNOSIS — R5381 Other malaise: Secondary | ICD-10-CM

## 2012-02-07 NOTE — Assessment & Plan Note (Signed)
Continue with current prescription therapy as reflected on the Med list.  

## 2012-02-07 NOTE — Telephone Encounter (Signed)
Done

## 2012-02-07 NOTE — Progress Notes (Signed)
Patient ID: Richard Ellis, male   DOB: [birth year], 66 y.o.   MRN: [id number]  Subjective:    Patient ID: Richard Ellis, male    DOB: March 05, 1946, 66 y.o.   MRN: [id number]  HPI  The patient presents for a follow-up of  chronic hypothyroidism, chronic dyslipidemia, elev glu controlled with medicines  BP Readings from Last 3 Encounters:  02/07/12 130/80  11/06/11 110/68  09/06/11 116/60   Wt Readings from Last 3 Encounters:  02/07/12 179 lb (81.194 kg)  11/06/11 178 lb (80.74 kg)  09/06/11 182 lb (82.555 kg)      Review of Systems  Constitutional: Negative for appetite change, fatigue and unexpected weight change.  HENT: Negative for nosebleeds, congestion, sore throat, sneezing, trouble swallowing and neck pain.   Eyes: Negative for itching and visual disturbance.  Respiratory: Negative for cough.   Cardiovascular: Negative for chest pain, palpitations and leg swelling.  Gastrointestinal: Negative for nausea, diarrhea, blood in stool and abdominal distention.  Genitourinary: Negative for frequency and hematuria.  Musculoskeletal: Negative for back pain, joint swelling and gait problem.  Skin: Negative for rash.  Neurological: Negative for dizziness, tremors, speech difficulty and weakness.  Psychiatric/Behavioral: Negative for sleep disturbance, dysphoric mood and agitation. The patient is not nervous/anxious.        Objective:   Physical Exam  Constitutional: He is oriented to person, place, and time. He appears well-developed.  HENT:  Mouth/Throat: Oropharynx is clear and moist.  Eyes: Conjunctivae are normal. Pupils are equal, round, and reactive to light.  Neck: Normal range of motion. No JVD present. No thyromegaly present.  Cardiovascular: Normal rate, regular rhythm, normal heart sounds and intact distal pulses.  Exam reveals no gallop and no friction rub.   No murmur heard. Pulmonary/Chest: Effort normal and breath sounds normal. No respiratory distress. He has no  wheezes. He has no rales. He exhibits no tenderness.  Abdominal: Soft. Bowel sounds are normal. He exhibits no distension and no mass. There is no tenderness. There is no rebound and no guarding.  Musculoskeletal: Normal range of motion. He exhibits no edema and no tenderness.  Lymphadenopathy:    He has no cervical adenopathy.  Neurological: He is alert and oriented to person, place, and time. He has normal reflexes. No cranial nerve deficit. He exhibits normal muscle tone. Coordination normal.  Skin: Skin is warm and dry. No rash noted.  Psychiatric: He has a normal mood and affect. His behavior is normal. Judgment and thought content normal.     Lab Results  Component Value Date   WBC 4.4* 02/05/2012   HGB 12.7* 02/05/2012   HCT 38.0* 02/05/2012   PLT 164.0 02/05/2012   CHOL 250* 02/05/2012   TRIG 240.0* 02/05/2012   HDL 51.40 02/05/2012   LDLDIRECT 147.9 02/05/2012   ALT 30 02/05/2012   AST 32 02/05/2012   NA 138 02/05/2012   K 3.9 02/05/2012   CL 105 02/05/2012   CREATININE 1.0 02/05/2012   BUN 20 02/05/2012   CO2 26 02/05/2012   TSH 2.31 02/05/2012   PSA 2.67 04/30/2011   HGBA1C 5.8 04/02/2008        Assessment & Plan:

## 2012-02-07 NOTE — Assessment & Plan Note (Addendum)
Continue with current prescription therapy as reflected on the Med list.  on Lovasa

## 2012-02-07 NOTE — Telephone Encounter (Signed)
Please, mail the labs to the patient.     Thx 

## 2012-02-08 ENCOUNTER — Encounter: Payer: Self-pay | Admitting: Internal Medicine

## 2012-02-15 ENCOUNTER — Encounter: Payer: Self-pay | Admitting: Internal Medicine

## 2012-03-15 ENCOUNTER — Other Ambulatory Visit: Payer: Self-pay | Admitting: Internal Medicine

## 2012-04-02 ENCOUNTER — Encounter: Payer: Self-pay | Admitting: Internal Medicine

## 2012-04-02 ENCOUNTER — Ambulatory Visit (AMBULATORY_SURGERY_CENTER): Payer: Federal, State, Local not specified - PPO

## 2012-04-02 VITALS — Ht 73.5 in | Wt 181.4 lb

## 2012-04-02 DIAGNOSIS — Z1211 Encounter for screening for malignant neoplasm of colon: Secondary | ICD-10-CM

## 2012-04-02 MED ORDER — MOVIPREP 100 G PO SOLR: 1.0000 | Freq: Once | ORAL | Status: DC

## 2012-04-16 ENCOUNTER — Encounter: Payer: Self-pay | Admitting: Internal Medicine

## 2012-04-16 ENCOUNTER — Ambulatory Visit (AMBULATORY_SURGERY_CENTER): Payer: Medicare Other | Admitting: Internal Medicine

## 2012-04-16 VITALS — BP 123/69 | HR 39 | Temp 98.2°F | Resp 19 | Ht 73.0 in | Wt 181.0 lb

## 2012-04-16 DIAGNOSIS — Z1211 Encounter for screening for malignant neoplasm of colon: Secondary | ICD-10-CM

## 2012-04-16 DIAGNOSIS — D126 Benign neoplasm of colon, unspecified: Secondary | ICD-10-CM

## 2012-04-16 MED ORDER — SODIUM CHLORIDE 0.9 % IV SOLN: 500.0000 mL | INTRAVENOUS | Status: DC

## 2012-04-16 NOTE — Patient Instructions (Addendum)

## 2012-04-16 NOTE — Progress Notes (Signed)
Abdominal pressure by the tech to aid the scope advancement to reach the cecum. Maw   

## 2012-04-16 NOTE — Progress Notes (Signed)
Patient did not experience any of the following events: a burn prior to discharge; a fall within the facility; wrong site/side/patient/procedure/implant event; or a hospital transfer or hospital admission upon discharge from the facility. (G8907) Patient did not have preoperative order for IV antibiotic SSI prophylaxis. (G8918)  

## 2012-04-16 NOTE — Progress Notes (Signed)
The pt tolerated the colonoscopy very well. Maw   

## 2012-04-16 NOTE — Op Note (Signed)
[facility] [address] [place], [place]  [address]  COLONOSCOPY PROCEDURE REPORT  PATIENT:  Richard, Ellis  MR#:  [id number] BIRTHDATE:  03/19/1946, 66 yrs. old  GENDER:  male ENDOSCOPIST:  Wilhemina Bonito. Eda Keys, MD REF. BY:  Screening / Recall PROCEDURE DATE:  04/16/2012 PROCEDURE:  Colonoscopy with snare polypectomy x 1 ASA CLASS:  Class II INDICATIONS:  Screening ; index 03-2001 w/ HPP MEDICATIONS:   MAC sedation, administered by CRNA, propofol (Diprivan) 320 mg IV  DESCRIPTION OF PROCEDURE:   After the risks benefits and alternatives of the procedure were thoroughly explained, informed consent was obtained.  Digital rectal exam was performed and revealed no abnormalities.   The LB CF-[id number] endoscope was introduced through the anus and advanced to the cecum, which was identified by both the appendix and ileocecal valve, without limitations.  The quality of the prep was excellent, using MoviPrep.  The instrument was then slowly withdrawn as the colon was fully examined. <<PROCEDUREIMAGES>>  FINDINGS:  The terminal ileum appeared normal.  A 7mm sessile polyp was found in the ascending colon and snared without cautery. Retrieval was successful.  Otherwise normal colonoscopy without other polyps, masses, vascular ectasias, or inflammatory changes. Retroflexed views in the rectum revealed no abnormalities.    The time to cecum =  5:26  minutes. The scope was then withdrawn in 13:16  minutes from the cecum and the procedure completed.  COMPLICATIONS:  None  ENDOSCOPIC IMPRESSION: 1) Normal terminal ileum 2) Sessile polyp in the ascending colon - removed 3) Otherwise normal colonoscopy  RECOMMENDATIONS: 1) Repeat colonoscopy in 5 years if polyp adenomatous; otherwise 10 years  ______________________________ Wilhemina Bonito. Eda Keys, MD  CC:  Linda Hedges. Plotnikov, MD;  The Patient  n. eSIGNED:   Wilhemina Bonito. Eda Keys at 04/16/2012 12:26 PM  Marny Lowenstein, [phone]

## 2012-04-17 ENCOUNTER — Telehealth: Payer: Self-pay

## 2012-04-17 NOTE — Telephone Encounter (Signed)
The # [phone] was called twice and it was an [place] message saying the line was dissconnected or out of service. Maw

## 2012-04-29 ENCOUNTER — Encounter: Payer: Self-pay | Admitting: Internal Medicine

## 2012-08-06 ENCOUNTER — Other Ambulatory Visit (INDEPENDENT_AMBULATORY_CARE_PROVIDER_SITE_OTHER): Payer: Medicare Other

## 2012-08-06 DIAGNOSIS — R5383 Other fatigue: Secondary | ICD-10-CM

## 2012-08-06 DIAGNOSIS — E785 Hyperlipidemia, unspecified: Secondary | ICD-10-CM

## 2012-08-06 DIAGNOSIS — R5381 Other malaise: Secondary | ICD-10-CM

## 2012-08-06 DIAGNOSIS — N320 Bladder-neck obstruction: Secondary | ICD-10-CM

## 2012-08-06 DIAGNOSIS — M1990 Unspecified osteoarthritis, unspecified site: Secondary | ICD-10-CM

## 2012-08-06 DIAGNOSIS — J45909 Unspecified asthma, uncomplicated: Secondary | ICD-10-CM

## 2012-08-06 DIAGNOSIS — E039 Hypothyroidism, unspecified: Secondary | ICD-10-CM

## 2012-08-06 LAB — URINALYSIS
Bilirubin Urine: NEGATIVE
Hgb urine dipstick: NEGATIVE
Ketones, ur: NEGATIVE
Leukocytes, UA: NEGATIVE
Nitrite: NEGATIVE
Specific Gravity, Urine: 1.015 (ref 1.000–1.030)
Total Protein, Urine: NEGATIVE
Urine Glucose: NEGATIVE
Urobilinogen, UA: 0.2 (ref 0.0–1.0)
pH: 6 (ref 5.0–8.0)

## 2012-08-06 LAB — LIPID PANEL
Cholesterol: 268 mg/dL — ABNORMAL HIGH (ref 0–200)
HDL: 53 mg/dL (ref >39.00)
Total CHOL/HDL Ratio: 5
Triglycerides: 133 mg/dL (ref 0.0–149.0)
VLDL: 26.6 mg/dL (ref 0.0–40.0)

## 2012-08-06 LAB — CBC WITH DIFFERENTIAL/PLATELET
Basophils Absolute: 0 10*3/uL (ref 0.0–0.1)
Basophils Relative: 0.6 % (ref 0.0–3.0)
Eosinophils Absolute: 0.1 10*3/uL (ref 0.0–0.7)
Eosinophils Relative: 2.1 % (ref 0.0–5.0)
HCT: 39 % (ref 39.0–52.0)
Hemoglobin: 12.9 g/dL — ABNORMAL LOW (ref 13.0–17.0)
Lymphocytes Relative: 37 % (ref 12.0–46.0)
Lymphs Abs: 1.7 10*3/uL (ref 0.7–4.0)
MCHC: 33.1 g/dL (ref 30.0–36.0)
MCV: 96.4 fl (ref 78.0–100.0)
Monocytes Absolute: 0.5 10*3/uL (ref 0.1–1.0)
Monocytes Relative: 11.2 % (ref 3.0–12.0)
Neutro Abs: 2.2 10*3/uL (ref 1.4–7.7)
Neutrophils Relative %: 49.1 % (ref 43.0–77.0)
Platelets: 155 10*3/uL (ref 150.0–400.0)
RBC: 4.05 Mil/uL — ABNORMAL LOW (ref 4.22–5.81)
RDW: 12.8 % (ref 11.5–14.6)
WBC: 4.6 10*3/uL (ref 4.5–10.5)

## 2012-08-06 LAB — BASIC METABOLIC PANEL
BUN: 25 mg/dL — ABNORMAL HIGH (ref 6–23)
CO2: 29 mEq/L (ref 19–32)
Calcium: 9.2 mg/dL (ref 8.4–10.5)
Chloride: 103 mEq/L (ref 96–112)
Creatinine, Ser: 1.1 mg/dL (ref 0.4–1.5)
GFR: 69.65 mL/min (ref >60.00)
Glucose, Bld: 89 mg/dL (ref 70–99)
Potassium: 3.8 mEq/L (ref 3.5–5.1)
Sodium: 138 mEq/L (ref 135–145)

## 2012-08-06 LAB — TSH: TSH: 1.56 u[IU]/mL (ref 0.35–5.50)

## 2012-08-06 LAB — TESTOSTERONE: Testosterone: 670.85 ng/dL (ref 350.00–890.00)

## 2012-08-06 LAB — HEPATIC FUNCTION PANEL
ALT: 25 U/L (ref 0–53)
AST: 31 U/L (ref 0–37)
Albumin: 3.8 g/dL (ref 3.5–5.2)
Alkaline Phosphatase: 41 U/L (ref 39–117)
Bilirubin, Direct: 0.1 mg/dL (ref 0.0–0.3)
Total Bilirubin: 1.1 mg/dL (ref 0.3–1.2)
Total Protein: 6.9 g/dL (ref 6.0–8.3)

## 2012-08-06 LAB — LDL CHOLESTEROL, DIRECT: Direct LDL: 168.2 mg/dL

## 2012-08-11 ENCOUNTER — Ambulatory Visit (INDEPENDENT_AMBULATORY_CARE_PROVIDER_SITE_OTHER): Payer: Federal, State, Local not specified - PPO | Admitting: Internal Medicine

## 2012-08-11 ENCOUNTER — Other Ambulatory Visit (INDEPENDENT_AMBULATORY_CARE_PROVIDER_SITE_OTHER): Payer: Federal, State, Local not specified - PPO

## 2012-08-11 ENCOUNTER — Encounter: Payer: Self-pay | Admitting: Internal Medicine

## 2012-08-11 VITALS — BP 120/78 | HR 56 | Temp 97.4°F | Resp 16 | Ht 73.5 in | Wt 180.4 lb

## 2012-08-11 DIAGNOSIS — E785 Hyperlipidemia, unspecified: Secondary | ICD-10-CM

## 2012-08-11 DIAGNOSIS — R5381 Other malaise: Secondary | ICD-10-CM

## 2012-08-11 DIAGNOSIS — N320 Bladder-neck obstruction: Secondary | ICD-10-CM

## 2012-08-11 DIAGNOSIS — Z0000 Encounter for general adult medical examination without abnormal findings: Secondary | ICD-10-CM | POA: Insufficient documentation

## 2012-08-11 DIAGNOSIS — Z136 Encounter for screening for cardiovascular disorders: Secondary | ICD-10-CM

## 2012-08-11 DIAGNOSIS — R5383 Other fatigue: Secondary | ICD-10-CM

## 2012-08-11 DIAGNOSIS — E039 Hypothyroidism, unspecified: Secondary | ICD-10-CM

## 2012-08-11 DIAGNOSIS — Z23 Encounter for immunization: Secondary | ICD-10-CM

## 2012-08-11 DIAGNOSIS — R972 Elevated prostate specific antigen [PSA]: Secondary | ICD-10-CM

## 2012-08-11 LAB — PSA: PSA: 5.23 ng/mL — ABNORMAL HIGH (ref 0.10–4.00)

## 2012-08-11 MED ORDER — SILDENAFIL CITRATE 100 MG PO TABS: 100.0000 mg | ORAL_TABLET | ORAL | Status: DC | PRN

## 2012-08-11 NOTE — Assessment & Plan Note (Addendum)
We discussed age appropriate health related issues, including available/recomended screening tests and vaccinations. We discussed a need for adhering to healthy diet and exercise. Labs/EKG were reviewed/ordered. All questions were answered. DT due next year Pneumovax Declined flu

## 2012-08-11 NOTE — Assessment & Plan Note (Signed)
Continue with current prescription therapy as reflected on the Med list.  

## 2012-08-11 NOTE — Assessment & Plan Note (Signed)
On fish oil and niacin

## 2012-08-11 NOTE — Assessment & Plan Note (Signed)
Check testost 

## 2012-08-12 ENCOUNTER — Telehealth: Payer: Self-pay | Admitting: Internal Medicine

## 2012-08-12 NOTE — Telephone Encounter (Signed)
Richard Ellis, please, ask the lab if they can run a free PSA on this last serum specimen Thx

## 2012-08-12 NOTE — Telephone Encounter (Signed)
Spoke to [place] in [facility] lab. She states test can be added. Add on order faxed down to [facility] lab.

## 2012-08-13 ENCOUNTER — Encounter: Payer: Self-pay | Admitting: Internal Medicine

## 2012-08-13 ENCOUNTER — Ambulatory Visit: Payer: Federal, State, Local not specified - PPO

## 2012-08-13 DIAGNOSIS — R972 Elevated prostate specific antigen [PSA]: Secondary | ICD-10-CM | POA: Insufficient documentation

## 2012-08-13 NOTE — Progress Notes (Signed)
   Subjective:    Patient ID: Richard Ellis, male    DOB: 11/11/1945, 66 y.o.   MRN: [id number]  HPI  The patient is here for a wellness exam. The patient has been doing well overall without major physical or psychological issues going on lately. C/o decreased libido - exercising a lot.  BP Readings from Last 3 Encounters:  08/11/12 120/78  04/16/12 123/69  02/07/12 130/80   Wt Readings from Last 3 Encounters:  08/11/12 180 lb 6 oz (81.818 kg)  04/16/12 181 lb (82.101 kg)  04/02/12 181 lb 6.4 oz (82.283 kg)     Review of Systems  Constitutional: Negative for appetite change, fatigue and unexpected weight change.  HENT: Negative for nosebleeds, congestion, sore throat, sneezing, trouble swallowing and neck pain.   Eyes: Negative for itching and visual disturbance.  Respiratory: Negative for cough.   Cardiovascular: Negative for chest pain, palpitations and leg swelling.  Gastrointestinal: Negative for nausea, diarrhea, blood in stool and abdominal distention.  Genitourinary: Negative for frequency and hematuria.  Musculoskeletal: Negative for myalgias, back pain, joint swelling, arthralgias and gait problem.  Skin: Negative for color change, rash and wound.  Neurological: Negative for dizziness, tremors, speech difficulty and weakness.  Psychiatric/Behavioral: Negative for suicidal ideas, sleep disturbance, dysphoric mood and agitation. The patient is not nervous/anxious.        Objective:   Physical Exam  Constitutional: He is oriented to person, place, and time. He appears well-developed.  HENT:  Mouth/Throat: Oropharynx is clear and moist.  Eyes: Conjunctivae normal are normal. Pupils are equal, round, and reactive to light.  Neck: Normal range of motion. No JVD present. No thyromegaly present.  Cardiovascular: Normal rate, regular rhythm, normal heart sounds and intact distal pulses.  Exam reveals no gallop and no friction rub.   No murmur heard. Pulmonary/Chest: Effort  normal and breath sounds normal. No respiratory distress. He has no wheezes. He has no rales. He exhibits no tenderness.  Abdominal: Soft. Bowel sounds are normal. He exhibits no distension and no mass. There is no tenderness. There is no rebound and no guarding.  Genitourinary: Rectum normal. Guaiac negative stool. No penile tenderness.       Prostate 1+  Musculoskeletal: Normal range of motion. He exhibits no edema and no tenderness.  Lymphadenopathy:    He has no cervical adenopathy.  Neurological: He is alert and oriented to person, place, and time. He has normal reflexes. No cranial nerve deficit. He exhibits normal muscle tone. Coordination normal.  Skin: Skin is warm and dry. No rash noted.  Psychiatric: He has a normal mood and affect. His behavior is normal. Judgment and thought content normal.   R lat chest wall  Small but dark mole    Lab Results  Component Value Date   WBC 4.6 08/06/2012   HGB 12.9* 08/06/2012   HCT 39.0 08/06/2012   PLT 155.0 08/06/2012   GLUCOSE 89 08/06/2012   CHOL 268* 08/06/2012   TRIG 133.0 08/06/2012   HDL 53.00 08/06/2012   LDLDIRECT 168.2 08/06/2012   ALT 25 08/06/2012   AST 31 08/06/2012   NA 138 08/06/2012   K 3.8 08/06/2012   CL 103 08/06/2012   CREATININE 1.1 08/06/2012   BUN 25* 08/06/2012   CO2 29 08/06/2012   TSH 1.56 08/06/2012   PSA 5.23* 08/11/2012   HGBA1C 5.8 04/02/2008     Assessment & Plan:

## 2012-08-13 NOTE — Assessment & Plan Note (Signed)
Free PSA in 1 month

## 2012-08-14 LAB — PSA: PSA: 5.4 ng/mL — ABNORMAL HIGH (ref <=4.00)

## 2012-08-14 LAB — PSA, FREE
PSA, Free Pct: 25 % (ref >25)
PSA, Free: 1.34 ng/mL

## 2012-09-18 ENCOUNTER — Other Ambulatory Visit: Payer: Self-pay | Admitting: Internal Medicine

## 2012-10-02 ENCOUNTER — Telehealth: Payer: Self-pay | Admitting: Internal Medicine

## 2012-10-02 DIAGNOSIS — R972 Elevated prostate specific antigen [PSA]: Secondary | ICD-10-CM

## 2012-10-02 NOTE — Telephone Encounter (Signed)
Pt received a letter asking if he wanted to be scheduled at Urology.  He does want the appt.  Please refer him.

## 2012-10-03 NOTE — Telephone Encounter (Signed)
Pt is aware that [facility] will call.

## 2012-10-03 NOTE — Telephone Encounter (Signed)
Ok Done Thx 

## 2012-10-26 ENCOUNTER — Emergency Department (HOSPITAL_COMMUNITY)
Admission: EM | Admit: 2012-10-26 | Discharge: 2012-10-27 | Disposition: A | Discharge status: Home / Self Care | Payer: Federal, State, Local not specified - PPO | Attending: Emergency Medicine | Admitting: Emergency Medicine

## 2012-10-26 ENCOUNTER — Encounter (HOSPITAL_COMMUNITY): Payer: Self-pay | Admitting: Emergency Medicine

## 2012-10-26 ENCOUNTER — Encounter (HOSPITAL_COMMUNITY)
Admission: EM | Disposition: A | Discharge status: Home / Self Care | Payer: Self-pay | Source: Home / Self Care | Attending: Emergency Medicine

## 2012-10-26 DIAGNOSIS — T189XXA Foreign body of alimentary tract, part unspecified, initial encounter: Secondary | ICD-10-CM

## 2012-10-26 DIAGNOSIS — IMO0002 Reserved for concepts with insufficient information to code with codable children: Secondary | ICD-10-CM | POA: Insufficient documentation

## 2012-10-26 DIAGNOSIS — K222 Esophageal obstruction: Secondary | ICD-10-CM | POA: Insufficient documentation

## 2012-10-26 DIAGNOSIS — T18108A Unspecified foreign body in esophagus causing other injury, initial encounter: Secondary | ICD-10-CM | POA: Insufficient documentation

## 2012-10-26 HISTORY — PX: ESOPHAGOGASTRODUODENOSCOPY: SHX5428

## 2012-10-26 LAB — POCT I-STAT, CHEM 8
BUN: 22 mg/dL (ref 6–23)
Calcium, Ion: 1.25 mmol/L (ref 1.13–1.30)
Chloride: 105 mEq/L (ref 96–112)
Creatinine, Ser: 1.3 mg/dL (ref 0.50–1.35)
Glucose, Bld: 109 mg/dL — ABNORMAL HIGH (ref 70–99)
HCT: 38 % — ABNORMAL LOW (ref 39.0–52.0)
Hemoglobin: 12.9 g/dL — ABNORMAL LOW (ref 13.0–17.0)
Potassium: 3.7 mEq/L (ref 3.5–5.1)
Sodium: 142 mEq/L (ref 135–145)
TCO2: 28 mmol/L (ref 0–100)

## 2012-10-26 SURGERY — EGD (ESOPHAGOGASTRODUODENOSCOPY)
Anesthesia: Moderate Sedation

## 2012-10-26 MED ORDER — FENTANYL CITRATE 0.05 MG/ML IJ SOLN
INTRAMUSCULAR | Status: AC
  Filled 2012-10-26: qty 2

## 2012-10-26 MED ORDER — MIDAZOLAM HCL 5 MG/ML IJ SOLN
INTRAMUSCULAR | Status: AC
  Filled 2012-10-26: qty 2

## 2012-10-26 MED ORDER — SODIUM CHLORIDE 0.9 % IV SOLN: INTRAVENOUS | Status: DC

## 2012-10-26 NOTE — Consult Note (Signed)
[facility] CON[facility] Reason for consult: Food impaction Referring Physician: [facility]. Primary care physician Dr. Debbe Odea is an 67 y.o. male.  HPI: 67 year old white male with no chronic history reflux disease. Approximately 25 years ago he had a food impaction that had to be removed. He is a runner and an approximately 88 K. today was able to eat in drink following this. He was eating roast beef around 7 PM approximately 4 hours ago and has been unable to eat or drink since.  Past Medical History  Diagnosis Date  . Hyperlipidemia   . Osteoarthritis   . Allergy     rhinitis  . Bronchitis     Past Surgical History  Procedure Laterality Date  . Tonsillectomy      when he was younger    Family History  Problem Relation Age of Onset  . Coronary artery disease Other     male first degree relative <60  . Heart disease Father     heart attack  . Colon cancer Neg Hx     Social History:  reports that he has never smoked. He has never used smokeless tobacco. He reports that he drinks about 4.2 ounces of alcohol per week. He reports that he does not use illicit drugs.  Allergies:  Allergies  Allergen Reactions  . Atorvastatin Other (See Comments)    Muscle weakness    Medications;   PRN Meds  Results for orders placed during the hospital encounter of 10/26/12 (from the past 48 hour(s))  POCT I-STAT, CHEM 8     Status: Abnormal   Collection Time    10/26/12 11:05 PM      Result Value Range   Sodium 142  135 - 145 mEq/L   Potassium 3.7  3.5 - 5.1 mEq/L   Chloride 105  96 - 112 mEq/L   BUN 22  6 - 23 mg/dL   Creatinine, Ser 4.54  0.50 - 1.35 mg/dL   Glucose, Bld 098 (*) 70 - 99 mg/dL   Calcium, Ion 1.19  1.47 - 1.30 mmol/L   TCO2 28  0 - 100 mmol/L   Hemoglobin 12.9 (*) 13.0 - 17.0 g/dL   HCT 82.9 (*) 56.2 - 13.0 %    No results found.            Blood pressure 137/91, pulse 51, temperature 98.1 F (36.7 C),  temperature source Oral, resp. rate 21, SpO2 98.00%.  Physical exam:   Gen.-white male who is alert and oriented and appears somewhat uncomfortable Throat-normal no gross foreign body seen Lungs-clear Heart-regular rate and rhythm without murmurs or gallops  Assessment: 1. Esophageal obstruction due to impaction with meat  Plan: Will proceed with EGD and removal of food impaction at this time. Have discussed this procedure with the patient and his wife.   Dhruvan Gullion JR,Torianne Laflam L 10/26/2012, 11:36 PM

## 2012-10-26 NOTE — H&P (Signed)
Subjective:   Patient is a 67 y.o. male presents with food impaction. Please see previously dictated GI consultation note.. Procedure including risks and benefits discussed in office.  Patient Active Problem List   Diagnosis Date Noted  . Elevated PSA 08/13/2012  . Well adult exam 08/11/2012  . HIP PAIN 11/14/2010  . GROIN PAIN 11/14/2010  . NEOPLASM OF UNCERTAIN BEHAVIOR OF SKIN 10/27/2010  . ALLERGIC RHINITIS 10/27/2010  . ASTHMA 10/27/2010  . OSTEOARTHRITIS 10/27/2010  . MYALGIA 10/27/2010  . LEG PAIN 12/23/2008  . FATIGUE 12/23/2008  . BRONCHITIS, ACUTE 10/15/2008  . COUGH 10/15/2008  . Rash and other nonspecific skin eruption 10/07/2008  . CBC, ABNORMAL 10/07/2008  . OTHER SPECIFIED ACQUIRED HYPOTHYROIDISM 11/04/2007  . OTHER ABNORMAL GLUCOSE 11/04/2007  . UPPER RESPIRATORY INFECTION (URI) 09/25/2007  . HYPOTHYROIDISM 07/10/2007  . HYPERLIPIDEMIA 07/10/2007   Past Medical History  Diagnosis Date  . Hyperlipidemia   . Osteoarthritis   . Allergy     rhinitis  . Bronchitis     Past Surgical History  Procedure Laterality Date  . Tonsillectomy      when he was younger    Prescriptions prior to admission  Medication Sig Dispense Refill  . aspirin 325 MG tablet Take 325 mg by mouth daily.      . Cholecalciferol 2000 UNITS CAPS Take 1 capsule by mouth daily.        [name] levothyroxine (SYNTHROID, LEVOTHROID) 25 MCG tablet Take 25 mcg by mouth daily.      . Magnesium 200 MG TABS Take 1 tablet by mouth.      . Mometasone Furo-Formoterol Fum (DULERA) 100-5 MCG/ACT AERO Inhale into the lungs 2 (two) times daily.        . Multiple Vitamin (MULTIVITAMIN WITH MINERALS) TABS Take 1 tablet by mouth every other day.      . niacin 500 MG tablet Take 500 mg by mouth at bedtime as needed.        [name] omega-3 acid ethyl esters (LOVAZA) 1 G capsule Take 2 capsules (2 g total) by mouth 2 (two) times daily.  360 capsule  3  . sildenafil (VIAGRA) 100 MG tablet Take 1 tablet (100 mg total) by  mouth as needed for erectile dysfunction.  12 tablet  5   Allergies  Allergen Reactions  . Atorvastatin Other (See Comments)    Muscle weakness    History  Substance Use Topics  . Smoking status: Never Smoker   . Smokeless tobacco: Never Used  . Alcohol Use: 4.2 oz/week    7 Glasses of wine per week    Family History  Problem Relation Age of Onset  . Coronary artery disease Other     male first degree relative <60  . Heart disease Father     heart attack  . Colon cancer Neg Hx      Objective:   Patient Vitals for the past 8 hrs:  BP Temp Temp src Pulse Resp SpO2  10/26/12 2231 137/91 mmHg - - 51 21 98 %  10/26/12 2215 142/90 mmHg - - 59 - 99 %  10/26/12 2011 162/78 mmHg 98.1 F (36.7 C) Oral - 20 98 %         See MD Preop evaluation      Assessment:   Food impaction  Plan:   EGD with removal procedure including risks and benefits and possibility of failure needing to go to general anesthesia were discussed with the patient and his wife.

## 2012-10-26 NOTE — ED Provider Notes (Addendum)
History     CSN: [id number]  Arrival date & time 10/26/12  2008   First MD Initiated Contact with Patient 10/26/12 2210      Chief Complaint  Patient presents with  . Swallowed Foreign Body    (Consider location/radiation/quality/duration/timing/severity/associated sxs/prior treatment) HPI Comments: Patient was eating roast beef tonight and he states it got lodged in his esophagus. He is been unable to swallow his secretions and has been sent spitting his saliva and a bowl. He denies any shortness of breath and states just some mild discomfort in his epigastric region and feeling pressure.  Patient is a 67 y.o. male presenting with foreign body swallowed. The history is provided by the patient.  Swallowed Foreign Body This is a new problem. Episode onset: Occurred approximately 7 PM tonight. The problem occurs constantly. The problem has not changed since onset.Pertinent negatives include no chest pain, no abdominal pain and no shortness of breath. The symptoms are aggravated by swallowing. Nothing relieves the symptoms. The treatment provided no relief.    Past Medical History  Diagnosis Date  . Hyperlipidemia   . Osteoarthritis   . Allergy     rhinitis  . Bronchitis     Past Surgical History  Procedure Laterality Date  . Tonsillectomy      when he was younger    Family History  Problem Relation Age of Onset  . Coronary artery disease Other     male first degree relative <60  . Heart disease Father     heart attack  . Colon cancer Neg Hx     History  Substance Use Topics  . Smoking status: Never Smoker   . Smokeless tobacco: Never Used  . Alcohol Use: 4.2 oz/week    7 Glasses of wine per week      Review of Systems  Respiratory: Negative for shortness of breath.   Cardiovascular: Negative for chest pain.  Gastrointestinal: Negative for abdominal pain.  All other systems reviewed and are negative.    Allergies  Atorvastatin  Home Medications    Current Outpatient Rx  Name  Route  Sig  Dispense  Refill  . aspirin 325 MG tablet   Oral   Take 325 mg by mouth daily.         . Cholecalciferol 2000 UNITS CAPS   Oral   Take 1 capsule by mouth daily.           [name] levothyroxine (SYNTHROID, LEVOTHROID) 25 MCG tablet   Oral   Take 25 mcg by mouth daily.         . Magnesium 200 MG TABS   Oral   Take 1 tablet by mouth.         . Mometasone Furo-Formoterol Fum (DULERA) 100-5 MCG/ACT AERO   Inhalation   Inhale into the lungs 2 (two) times daily.           . Multiple Vitamin (MULTIVITAMIN WITH MINERALS) TABS   Oral   Take 1 tablet by mouth every other day.         . niacin 500 MG tablet   Oral   Take 500 mg by mouth at bedtime as needed.           [name] omega-3 acid ethyl esters (LOVAZA) 1 G capsule   Oral   Take 2 capsules (2 g total) by mouth 2 (two) times daily.   360 capsule   3   . sildenafil (VIAGRA) 100 MG tablet   Oral  Take 1 tablet (100 mg total) by mouth as needed for erectile dysfunction.   12 tablet   5     BP 162/78  Temp(Src) 98.1 F (36.7 C) (Oral)  Resp 20  SpO2 98%  Physical Exam  Nursing note and vitals reviewed. Constitutional: He is oriented to person, place, and time. He appears well-developed and well-nourished. No distress.  Spitting saliva and bowl  HENT:  Head: Normocephalic and atraumatic.  Mouth/Throat: Oropharynx is clear and moist.  Eyes: Conjunctivae and EOM are normal. Pupils are equal, round, and reactive to light.  Neck: Normal range of motion. Neck supple.  Cardiovascular: Normal rate, regular rhythm and intact distal pulses.   No murmur heard. Pulmonary/Chest: Effort normal and breath sounds normal. No respiratory distress. He has no wheezes. He has no rales.  Abdominal: Soft. He exhibits no distension. There is no tenderness. There is no rebound and no guarding.  Musculoskeletal: Normal range of motion. He exhibits no edema and no tenderness.  Neurological:  He is alert and oriented to person, place, and time.  Skin: Skin is warm and dry. No rash noted. No erythema.  Psychiatric: He has a normal mood and affect. His behavior is normal.    ED Course  Procedures (including critical care time)  Labs Reviewed  POCT I-STAT, CHEM 8 - Abnormal; Notable for the following:    Glucose, Bld 109 (*)    Hemoglobin 12.9 (*)    HCT 38.0 (*)    All other components within normal limits   No results found.   1. Swallowed foreign body       MDM   Patient with a food bolus impaction tonight. He was eating roast beef and 7:00 and states it has not gone down all the way. He has been regurgitating his saliva and unable to keep down any additional food or drink. Patient has a history of esophageal stricture and a food bolus impaction approximately 20-25 years ago. He does not see gastroenterology regularly and has never had to have his esophagus stretched.  GI called and will come in to scope the pt.      Gwyneth Sprout, MD 10/26/12 9528  Gwyneth Sprout, MD 10/26/12 4132  Gwyneth Sprout, MD 10/26/12 4401

## 2012-10-26 NOTE — ED Notes (Addendum)
Pt reports at about 730pm today he was eating dinner when he swallowed a piece of meat, pt reports he feels like it is stuck in his espohagus. Pt reports he has a stricture and has had this happen multiple times but is usually able to get it down on his own, once in the past he had to come to the ED to have it removed. Pt denies feeling SOB. Pt reports he is unable to swallow his saliva and keeps having to spit it up. Pt able to speak in full sentences. resp e/u, skin warm and dry.

## 2012-10-26 NOTE — ED Notes (Signed)
RN from endoscopy came to get report and take pt upstairs.

## 2012-10-26 NOTE — ED Notes (Addendum)
Pt states that he was eating Roast beef and feels it stuck in his throat. O2 stats are maintained  Pt speaking clearly  And full sentences.

## 2012-10-27 ENCOUNTER — Encounter (HOSPITAL_COMMUNITY): Payer: Self-pay | Admitting: Gastroenterology

## 2012-10-27 MED ORDER — MIDAZOLAM HCL 10 MG/2ML IJ SOLN
INTRAMUSCULAR | Status: DC | PRN
  Administered 2012-10-27 (×4): 2 mg via INTRAVENOUS

## 2012-10-27 MED ORDER — BUTAMBEN-TETRACAINE-BENZOCAINE 2-2-14 % EX AERO
INHALATION_SPRAY | CUTANEOUS | Status: DC | PRN
  Administered 2012-10-27: 2 via TOPICAL

## 2012-10-27 MED ORDER — FENTANYL CITRATE 0.05 MG/ML IJ SOLN
INTRAMUSCULAR | Status: DC | PRN
  Administered 2012-10-27 (×3): 25 ug via INTRAVENOUS

## 2012-10-27 NOTE — Op Note (Signed)
[facility] [address] [place], [address]   ENDOSCOPY PROCEDURE REPORT  PATIENT: Richard Ellis, Richard Ellis  MR#: [id number] BIRTHDATE: 28-May-1946 , 66  yrs. old GENDER: Male ENDOSCOPIST:Kenley Troop Randa Evens, MD REFERRED BY:  [facility]. PCP: Dr. Posey Rea PROCEDURE DATE:  10/26/2012 PROCEDURE:  EGD with removal of food impaction ASA CLASS:   class I INDICATIONS:  the patient has had impaction with roast beef for approximately 3 or 4 hours MEDICATION:    fentanyl 75 mcg Versed 8 mg IV TOPICAL ANESTHETIC:   Cetacaine spray  DESCRIPTION OF PROCEDURE: the procedure have been discussed with the patient and his wife and consent obtained. The Pentax upper scope was inserted and advanced into the esophagus. Virtually one half of the esophagus was packed with meat and other food material. At least 10-12 passages with the endoscope required with the each time large amounts of food material removed. There was a large amount of roast beef as well as vegetables material as well. The quadrant upon retriever and the [employer] where used multiple times. Eventually we were able to push the remaining pieces down into the stomach and a complete endoscopy performed. A large amount of food material in the stomach but no other gross lesions. The patient did have a stricture of the esophagus. There was no gross bleeding or immediate complication.     COMPLICATIONS: None  ENDOSCOPIC IMPRESSION: 1. Marked food impaction involving essentially the entire lower half of the esophagus. Multiple passages of the scope required 2. Esophageal stricture  RECOMMENDATIONS: 1. Keep the patient on ice chips only tonight and watch for pain or trouble breathing. We'll discuss this with his wife. He will begin liquids and soft foods tomorrow. Will ask him to begin omeprazole one tablet daily starting tomorrow. He will followup in the office in 2-3 weeks and we will discuss  dilatation.    _______________________________ Rosalie DoctorCarman Ching, MD 10/27/2012 12:49 AM   CC: Dr. Posey Rea

## 2013-01-27 ENCOUNTER — Other Ambulatory Visit: Payer: Self-pay | Admitting: Internal Medicine

## 2013-01-27 ENCOUNTER — Ambulatory Visit: Payer: Federal, State, Local not specified - PPO | Admitting: Internal Medicine

## 2013-01-28 ENCOUNTER — Other Ambulatory Visit: Payer: Federal, State, Local not specified - PPO

## 2013-01-28 ENCOUNTER — Other Ambulatory Visit: Payer: Self-pay | Admitting: *Deleted

## 2013-01-28 DIAGNOSIS — R972 Elevated prostate specific antigen [PSA]: Secondary | ICD-10-CM

## 2013-01-29 ENCOUNTER — Encounter: Payer: Self-pay | Admitting: Internal Medicine

## 2013-01-29 ENCOUNTER — Ambulatory Visit (INDEPENDENT_AMBULATORY_CARE_PROVIDER_SITE_OTHER): Payer: Federal, State, Local not specified - PPO | Admitting: Internal Medicine

## 2013-01-29 VITALS — BP 100/62 | HR 76 | Temp 98.2°F | Resp 16 | Wt 176.0 lb

## 2013-01-29 DIAGNOSIS — E039 Hypothyroidism, unspecified: Secondary | ICD-10-CM

## 2013-01-29 DIAGNOSIS — Z0000 Encounter for general adult medical examination without abnormal findings: Secondary | ICD-10-CM

## 2013-01-29 DIAGNOSIS — R972 Elevated prostate specific antigen [PSA]: Secondary | ICD-10-CM

## 2013-01-29 DIAGNOSIS — E785 Hyperlipidemia, unspecified: Secondary | ICD-10-CM

## 2013-01-29 DIAGNOSIS — L570 Actinic keratosis: Secondary | ICD-10-CM

## 2013-01-29 MED ORDER — OMEGA-3-ACID ETHYL ESTERS 1 G PO CAPS: 2.0000 g | ORAL_CAPSULE | Freq: Two times a day (BID) | ORAL | Status: DC

## 2013-01-29 NOTE — Assessment & Plan Note (Signed)
Continue with current prescription therapy as reflected on the Med list.  

## 2013-01-29 NOTE — Progress Notes (Signed)
Subjective:    HPI  The patient presents for a follow-up of  chronic hypothyroidism, chronic dyslipidemia, elev glu controlled with medicines C/o one episode of dizziness several months ago x 3 d - resolved C/o mole  BP Readings from Last 3 Encounters:  01/29/13 100/62  10/27/12 129/77  10/27/12 129/77   Wt Readings from Last 3 Encounters:  01/29/13 176 lb (79.833 kg)  10/26/12 175 lb (79.379 kg)  10/26/12 175 lb (79.379 kg)      Review of Systems  Constitutional: Negative for appetite change, fatigue and unexpected weight change.  HENT: Negative for nosebleeds, congestion, sore throat, sneezing, trouble swallowing and neck pain.   Eyes: Negative for itching and visual disturbance.  Respiratory: Negative for cough.   Cardiovascular: Negative for chest pain, palpitations and leg swelling.  Gastrointestinal: Negative for nausea, diarrhea, blood in stool and abdominal distention.  Genitourinary: Negative for frequency and hematuria.  Musculoskeletal: Negative for back pain, joint swelling and gait problem.  Skin: Negative for rash.  Neurological: Negative for dizziness, tremors, speech difficulty and weakness.  Psychiatric/Behavioral: Negative for sleep disturbance, dysphoric mood and agitation. The patient is not nervous/anxious.        Objective:   Physical Exam  Constitutional: He is oriented to person, place, and time. He appears well-developed.  HENT:  Mouth/Throat: Oropharynx is clear and moist.  Eyes: Conjunctivae are normal. Pupils are equal, round, and reactive to light.  Neck: Normal range of motion. No JVD present. No thyromegaly present.  Cardiovascular: Normal rate, regular rhythm, normal heart sounds and intact distal pulses.  Exam reveals no gallop and no friction rub.   No murmur heard. Pulmonary/Chest: Effort normal and breath sounds normal. No respiratory distress. He has no wheezes. He has no rales. He exhibits no tenderness.  Abdominal: Soft. Bowel  sounds are normal. He exhibits no distension and no mass. There is no tenderness. There is no rebound and no guarding.  Musculoskeletal: Normal range of motion. He exhibits no edema and no tenderness.  Lymphadenopathy:    He has no cervical adenopathy.  Neurological: He is alert and oriented to person, place, and time. He has normal reflexes. No cranial nerve deficit. He exhibits normal muscle tone. Coordination normal.  Skin: Skin is warm and dry. No rash noted.  Psychiatric: He has a normal mood and affect. His behavior is normal. Judgment and thought content normal.  AK on back   Lab Results  Component Value Date   WBC 4.6 08/06/2012   HGB 12.9* 10/26/2012   HCT 38.0* 10/26/2012   PLT 155.0 08/06/2012   CHOL 268* 08/06/2012   TRIG 133.0 08/06/2012   HDL 53.00 08/06/2012   LDLDIRECT 168.2 08/06/2012   ALT 25 08/06/2012   AST 31 08/06/2012   NA 142 10/26/2012   K 3.7 10/26/2012   CL 105 10/26/2012   CREATININE 1.30 10/26/2012   BUN 22 10/26/2012   CO2 29 08/06/2012   TSH 1.56 08/06/2012   PSA 5.40* 08/13/2012   HGBA1C 5.8 04/02/2008    Procedure Note :     Procedure : Cryosurgery   Indication:    Actinic keratosis(es)   Risks including unsuccessful procedure , bleeding, infection, bruising, scar, a need for a repeat  procedure and others were explained to the patient in detail as well as the benefits. Informed consent was obtained verbally.   1  lesion(s)  on back   was/were treated with liquid nitrogen on a Q-tip in a usual fasion . Band-Aid  was applied and antibiotic ointment was given for a later use.   Tolerated well. Complications none.       Assessment & Plan:

## 2013-01-29 NOTE — Assessment & Plan Note (Signed)
Cryo  

## 2013-01-29 NOTE — Assessment & Plan Note (Signed)
Per Urol 

## 2013-04-07 ENCOUNTER — Other Ambulatory Visit: Payer: Self-pay | Admitting: Internal Medicine

## 2013-07-02 ENCOUNTER — Encounter (HOSPITAL_COMMUNITY): Payer: Self-pay | Admitting: Emergency Medicine

## 2013-07-02 ENCOUNTER — Observation Stay (HOSPITAL_COMMUNITY): Payer: Federal, State, Local not specified - PPO

## 2013-07-02 ENCOUNTER — Emergency Department (HOSPITAL_COMMUNITY): Payer: Federal, State, Local not specified - PPO

## 2013-07-02 ENCOUNTER — Observation Stay (HOSPITAL_COMMUNITY)
Admission: EM | Admit: 2013-07-02 | Discharge: 2013-07-03 | Disposition: A | Discharge status: Home / Self Care | Payer: Federal, State, Local not specified - PPO | Attending: Family Medicine | Admitting: Family Medicine

## 2013-07-02 DIAGNOSIS — M1990 Unspecified osteoarthritis, unspecified site: Secondary | ICD-10-CM

## 2013-07-02 DIAGNOSIS — D485 Neoplasm of uncertain behavior of skin: Secondary | ICD-10-CM

## 2013-07-02 DIAGNOSIS — I639 Cerebral infarction, unspecified: Secondary | ICD-10-CM

## 2013-07-02 DIAGNOSIS — R109 Unspecified abdominal pain: Secondary | ICD-10-CM

## 2013-07-02 DIAGNOSIS — R519 Headache, unspecified: Secondary | ICD-10-CM

## 2013-07-02 DIAGNOSIS — R05 Cough: Secondary | ICD-10-CM

## 2013-07-02 DIAGNOSIS — H53419 Scotoma involving central area, unspecified eye: Secondary | ICD-10-CM | POA: Insufficient documentation

## 2013-07-02 DIAGNOSIS — E785 Hyperlipidemia, unspecified: Secondary | ICD-10-CM | POA: Diagnosis present

## 2013-07-02 DIAGNOSIS — J209 Acute bronchitis, unspecified: Secondary | ICD-10-CM

## 2013-07-02 DIAGNOSIS — R5381 Other malaise: Secondary | ICD-10-CM

## 2013-07-02 DIAGNOSIS — R001 Bradycardia, unspecified: Secondary | ICD-10-CM

## 2013-07-02 DIAGNOSIS — E039 Hypothyroidism, unspecified: Secondary | ICD-10-CM | POA: Diagnosis present

## 2013-07-02 DIAGNOSIS — L570 Actinic keratosis: Secondary | ICD-10-CM

## 2013-07-02 DIAGNOSIS — R799 Abnormal finding of blood chemistry, unspecified: Secondary | ICD-10-CM

## 2013-07-02 DIAGNOSIS — Z8673 Personal history of transient ischemic attack (TIA), and cerebral infarction without residual deficits: Secondary | ICD-10-CM | POA: Diagnosis present

## 2013-07-02 DIAGNOSIS — R972 Elevated prostate specific antigen [PSA]: Secondary | ICD-10-CM

## 2013-07-02 DIAGNOSIS — R5383 Other fatigue: Secondary | ICD-10-CM

## 2013-07-02 DIAGNOSIS — R21 Rash and other nonspecific skin eruption: Secondary | ICD-10-CM

## 2013-07-02 DIAGNOSIS — J45909 Unspecified asthma, uncomplicated: Secondary | ICD-10-CM

## 2013-07-02 DIAGNOSIS — R51 Headache: Secondary | ICD-10-CM

## 2013-07-02 DIAGNOSIS — H547 Unspecified visual loss: Secondary | ICD-10-CM

## 2013-07-02 DIAGNOSIS — Z7902 Long term (current) use of antithrombotics/antiplatelets: Secondary | ICD-10-CM | POA: Insufficient documentation

## 2013-07-02 DIAGNOSIS — E038 Other specified hypothyroidism: Secondary | ICD-10-CM

## 2013-07-02 DIAGNOSIS — Z0000 Encounter for general adult medical examination without abnormal findings: Secondary | ICD-10-CM

## 2013-07-02 DIAGNOSIS — R059 Cough, unspecified: Secondary | ICD-10-CM

## 2013-07-02 DIAGNOSIS — J309 Allergic rhinitis, unspecified: Secondary | ICD-10-CM

## 2013-07-02 DIAGNOSIS — R7309 Other abnormal glucose: Secondary | ICD-10-CM

## 2013-07-02 DIAGNOSIS — M79609 Pain in unspecified limb: Secondary | ICD-10-CM

## 2013-07-02 DIAGNOSIS — J069 Acute upper respiratory infection, unspecified: Secondary | ICD-10-CM

## 2013-07-02 DIAGNOSIS — I6350 Cerebral infarction due to unspecified occlusion or stenosis of unspecified cerebral artery: Principal | ICD-10-CM | POA: Insufficient documentation

## 2013-07-02 DIAGNOSIS — I6789 Other cerebrovascular disease: Secondary | ICD-10-CM

## 2013-07-02 DIAGNOSIS — IMO0001 Reserved for inherently not codable concepts without codable children: Secondary | ICD-10-CM

## 2013-07-02 DIAGNOSIS — I495 Sick sinus syndrome: Secondary | ICD-10-CM | POA: Insufficient documentation

## 2013-07-02 HISTORY — DX: Unspecified chronic bronchitis: J42

## 2013-07-02 HISTORY — DX: Anemia, unspecified: D64.9

## 2013-07-02 HISTORY — DX: Bradycardia, unspecified: R00.1

## 2013-07-02 HISTORY — DX: Cerebral infarction, unspecified: I63.9

## 2013-07-02 HISTORY — DX: Hypothyroidism, unspecified: E03.9

## 2013-07-02 HISTORY — DX: Cardiac murmur, unspecified: R01.1

## 2013-07-02 HISTORY — DX: Esophageal obstruction: K22.2

## 2013-07-02 LAB — URINALYSIS, ROUTINE W REFLEX MICROSCOPIC
Bilirubin Urine: NEGATIVE
Glucose, UA: NEGATIVE mg/dL
Hgb urine dipstick: NEGATIVE
Ketones, ur: NEGATIVE mg/dL
Leukocytes, UA: NEGATIVE
Nitrite: NEGATIVE
Protein, ur: NEGATIVE mg/dL
Specific Gravity, Urine: 1.022 (ref 1.005–1.030)
Urobilinogen, UA: 0.2 mg/dL (ref 0.0–1.0)
pH: 5.5 (ref 5.0–8.0)

## 2013-07-02 LAB — RAPID URINE DRUG SCREEN, HOSP PERFORMED
Amphetamines: NOT DETECTED
Barbiturates: NOT DETECTED
Benzodiazepines: NOT DETECTED
Cocaine: NOT DETECTED
Opiates: POSITIVE — AB
Tetrahydrocannabinol: NOT DETECTED

## 2013-07-02 LAB — CBC WITH DIFFERENTIAL/PLATELET
Basophils Absolute: 0 10*3/uL (ref 0.0–0.1)
Basophils Relative: 0 % (ref 0–1)
Eosinophils Absolute: 0.2 10*3/uL (ref 0.0–0.7)
Eosinophils Relative: 3 % (ref 0–5)
HCT: 35.4 % — ABNORMAL LOW (ref 39.0–52.0)
Hemoglobin: 12.8 g/dL — ABNORMAL LOW (ref 13.0–17.0)
Lymphocytes Relative: 29 % (ref 12–46)
Lymphs Abs: 1.6 10*3/uL (ref 0.7–4.0)
MCH: 32.9 pg (ref 26.0–34.0)
MCHC: 36.2 g/dL — ABNORMAL HIGH (ref 30.0–36.0)
MCV: 91 fL (ref 78.0–100.0)
Monocytes Absolute: 0.6 10*3/uL (ref 0.1–1.0)
Monocytes Relative: 12 % (ref 3–12)
Neutro Abs: 3 10*3/uL (ref 1.7–7.7)
Neutrophils Relative %: 56 % (ref 43–77)
Platelets: 145 10*3/uL — ABNORMAL LOW (ref 150–400)
RBC: 3.89 MIL/uL — ABNORMAL LOW (ref 4.22–5.81)
RDW: 12.3 % (ref 11.5–15.5)
WBC: 5.4 10*3/uL (ref 4.0–10.5)

## 2013-07-02 LAB — COMPREHENSIVE METABOLIC PANEL
ALT: 24 U/L (ref 0–53)
AST: 31 U/L (ref 0–37)
Albumin: 3.5 g/dL (ref 3.5–5.2)
Alkaline Phosphatase: 44 U/L (ref 39–117)
BUN: 24 mg/dL — ABNORMAL HIGH (ref 6–23)
CO2: 27 mEq/L (ref 19–32)
Calcium: 8.9 mg/dL (ref 8.4–10.5)
Chloride: 101 mEq/L (ref 96–112)
Creatinine, Ser: 1.07 mg/dL (ref 0.50–1.35)
GFR calc Af Amer: 81 mL/min — ABNORMAL LOW (ref >90)
GFR calc non Af Amer: 70 mL/min — ABNORMAL LOW (ref >90)
Glucose, Bld: 99 mg/dL (ref 70–99)
Potassium: 3.8 mEq/L (ref 3.5–5.1)
Sodium: 135 mEq/L (ref 135–145)
Total Bilirubin: 0.5 mg/dL (ref 0.3–1.2)
Total Protein: 6.3 g/dL (ref 6.0–8.3)

## 2013-07-02 LAB — PROTIME-INR
INR: 1 (ref 0.00–1.49)
Prothrombin Time: 13 seconds (ref 11.6–15.2)

## 2013-07-02 LAB — POCT I-STAT TROPONIN I: Troponin i, poc: 0.01 ng/mL (ref 0.00–0.08)

## 2013-07-02 LAB — APTT: aPTT: 29 seconds (ref 24–37)

## 2013-07-02 LAB — ETHANOL: Alcohol, Ethyl (B): <11 mg/dL (ref 0–11)

## 2013-07-02 LAB — TROPONIN I: Troponin I: <0.3 ng/mL (ref <0.30)

## 2013-07-02 LAB — GLUCOSE, CAPILLARY: Glucose-Capillary: 97 mg/dL (ref 70–99)

## 2013-07-02 MED ORDER — MORPHINE SULFATE 4 MG/ML IJ SOLN
4.0000 mg | Freq: Once | INTRAMUSCULAR | Status: AC
  Administered 2013-07-02: 4 mg via INTRAVENOUS
  Filled 2013-07-02: qty 1

## 2013-07-02 MED ORDER — ACETAMINOPHEN 325 MG PO TABS: 650.0000 mg | ORAL_TABLET | ORAL | Status: DC | PRN

## 2013-07-02 MED ORDER — LEVOTHYROXINE SODIUM 25 MCG PO TABS
25.0000 ug | ORAL_TABLET | Freq: Every day | ORAL | Status: DC
  Administered 2013-07-03: 25 ug via ORAL
  Filled 2013-07-02 (×2): qty 1

## 2013-07-02 MED ORDER — ENOXAPARIN SODIUM 40 MG/0.4ML ~~LOC~~ SOLN
40.0000 mg | SUBCUTANEOUS | Status: DC
  Administered 2013-07-02: 40 mg via SUBCUTANEOUS
  Filled 2013-07-02 (×3): qty 0.4

## 2013-07-02 MED ORDER — ONDANSETRON HCL 4 MG/2ML IJ SOLN
4.0000 mg | Freq: Once | INTRAMUSCULAR | Status: AC
  Administered 2013-07-02: 4 mg via INTRAVENOUS
  Filled 2013-07-02: qty 2

## 2013-07-02 MED ORDER — ASPIRIN 325 MG PO TABS
325.0000 mg | ORAL_TABLET | Freq: Every day | ORAL | Status: DC
  Administered 2013-07-02 – 2013-07-03 (×2): 325 mg via ORAL
  Filled 2013-07-02 (×3): qty 1

## 2013-07-02 MED ORDER — OMEGA-3-ACID ETHYL ESTERS 1 G PO CAPS
2.0000 g | ORAL_CAPSULE | Freq: Two times a day (BID) | ORAL | Status: DC
  Administered 2013-07-02 – 2013-07-03 (×3): 2 g via ORAL
  Filled 2013-07-02 (×5): qty 2

## 2013-07-02 MED ORDER — ACETAMINOPHEN 650 MG RE SUPP: 650.0000 mg | RECTAL | Status: DC | PRN

## 2013-07-02 MED ORDER — HYDROCODONE-ACETAMINOPHEN 5-325 MG PO TABS: 1.0000 | ORAL_TABLET | Freq: Four times a day (QID) | ORAL | Status: DC | PRN

## 2013-07-02 NOTE — Consult Note (Addendum)
Referring Physician: Dr. Bebe Shaggy    Chief Complaint: Transient central vision loss, and persistent headache.  HPI: Richard Ellis is an 67 y.o. male with a history of hyperlipidemia who experienced sudden onset of central vision at about 3:00 yesterday afternoon. He said no previous symptoms of this sort. Onset of a headache at around the same time. Visual changes lasted for several hours then resolved. Headache persisted and became worse overnight. Headache was severe when he woke up this morning which prompted his desire for medical evaluation. There was no focal weakness nor focal numbness involving face or extremities other than slight numbness involving his left foot when he woke up this morning. No speech changes were noted. He was given morphine in the emergency room which greatly improved his headache symptoms. CT scan of his head showed no acute intracranial abnormality. Basilar tip appeared somewhat tense. Streak artifact is noted in the upper medulla. MRI and MRA of the brain without contrast were obtained. Results are pending.  LSN: 3 PM on 07/01/2013 tPA Given: No: Beyond time window for treatment consideration; deficits resolved prior to presentation. MRankin: 0  Past Medical History  Diagnosis Date  . Hyperlipidemia   . Osteoarthritis   . Allergy     rhinitis  . Bronchitis     Family History  Problem Relation Age of Onset  . Coronary artery disease Other     male first degree relative <60  . Heart disease Father     heart attack  . Colon cancer Neg Hx      Medications: I have reviewed the patient's current medications.  ROS: History obtained from spouse and the patient  General ROS: negative for - chills, fatigue, fever, night sweats, weight gain or weight loss Psychological ROS: negative for - behavioral disorder, hallucinations, memory difficulties, mood swings or suicidal ideation Ophthalmic ROS: negative for - blurry vision, double vision, eye pain or loss of  vision ENT ROS: negative for - epistaxis, nasal discharge, oral lesions, sore throat, tinnitus or vertigo Allergy and Immunology ROS: negative for - hives or itchy/watery eyes Hematological and Lymphatic ROS: negative for - bleeding problems, bruising or swollen lymph nodes Endocrine ROS: negative for - galactorrhea, hair pattern changes, polydipsia/polyuria or temperature intolerance Respiratory ROS: negative for - cough, hemoptysis, shortness of breath or wheezing Cardiovascular ROS: negative for - chest pain, dyspnea on exertion, edema or irregular heartbeat Gastrointestinal ROS: negative for - abdominal pain, diarrhea, hematemesis, nausea/vomiting or stool incontinence Genito-Urinary ROS: negative for - dysuria, hematuria, incontinence or urinary frequency/urgency Musculoskeletal ROS: negative for - joint swelling or muscular weakness Neurological ROS: as noted in HPI Dermatological ROS: negative for rash and skin lesion changes  Physical Examination: Blood pressure 105/72, pulse 41, temperature 98.4 F (36.9 C), temperature source Oral, resp. rate 16, height 6' 1.5" (1.867 m), weight 79.833 kg (176 lb), SpO2 98.00%.  Neurologic Examination: Mental Status: Alert, oriented, thought content appropriate.  Speech fluent without evidence of aphasia. Able to follow commands without difficulty. Cranial Nerves: II-Visual fields were normal. III/IV/VI-Pupils were equal and reacted. Extraocular movements were full and conjugate.    V/VII-no facial numbness and no facial weakness. VIII-normal. X-normal speech and symmetrical palatal movement. Motor: 5/5 bilaterally with normal tone and bulk Sensory: Normal throughout. Deep Tendon Reflexes: 2+ and symmetric. Plantars: Flexor bilaterally Cerebellar: Normal finger-to-nose testing. Carotid auscultation: Normal  Ct Head Wo Contrast  07/02/2013   CLINICAL DATA:  Nausea. Headache. Dizziness since yesterday. Episode of the visual difficulty.   EXAM:  CT HEAD WITHOUT CONTRAST  TECHNIQUE: Contiguous axial images were obtained from the base of the skull through the vertex without intravenous contrast.  COMPARISON:  None.  FINDINGS: No intracranial hemorrhage.  Basilar tip appears slightly dense. This may be a normal finding. Thrombus in the basilar tip not entirely excluded although felt to be less likely consideration.  Streak artifact through the upper medulla limits evaluation at this level. Otherwise no CT evidence of large acute infarct. If posterior fossa infarct is of high clinical concern, MR recommended.  No hydrocephalus.  No intracranial mass lesion noted on this unenhanced exam.  Orbital structures appear grossly intact.  IMPRESSION: No intracranial hemorrhage.  Basilar tip appears slightly dense. This may be a normal finding. Thrombus in the basilar tip not entirely excluded although felt to be less likely consideration.  Streak artifact through the upper medulla limits evaluation at this level. Otherwise no CT evidence of large acute infarct.  Results discussed with Roxy Horseman physician's assistant.   Electronically Signed   By: Bridgett Larsson M.D.   On: 07/02/2013 08:54    Assessment: 67 y.o. male presenting with symptoms indicative of transient ischemia involving posterior cerebral circulation. Acute cerebral infarction cannot be ruled out, however. As well, complicated migraine headache cannot be ruled out, but is less likely. MRI and MRA have been obtained and results are pending.  Stroke Risk Factors - hyperlipidemia  Plan: 1. HgbA1c, fasting lipid panel 2. PT consult, OT consult, Speech consult 3. Echocardiogram 4. Carotid dopplers 5. Prophylactic therapy-Antiplatelet med: Aspirin 81 mg per day 6. Risk factor modification 7. Telemetry monitoring    C.R. [name], MD [facility] [phone]  07/02/2013, 1:06 PM

## 2013-07-02 NOTE — ED Notes (Signed)
Pt oob to the bathroom.

## 2013-07-02 NOTE — ED Provider Notes (Signed)
Medical screening examination/treatment/procedure(s) were conducted as a shared visit with non-physician practitioner(s) and myself.  I personally evaluated the patient during the encounter  Pt stable in the ED He was bradycardic but asymptomatic throughout his stay Admission advised  Joya Gaskins, MD 07/02/13 1646

## 2013-07-02 NOTE — Progress Notes (Signed)
Echocardiogram 2D Echocardiogram has been performed.  07/02/2013 6:08 PM Gertie Fey, RVT, RDCS, RDMS

## 2013-07-02 NOTE — H&P (Signed)
[facility] History and Physical  TAI SKELLY JWJ:[id number] DOB: [birth year] DOA: 07/02/2013  Referring physician: Bebe Shaggy PCP: Sonda Primes, MD  Specialists: [name]  Chief Complaint: headache. Visual disturbance  HPI: Richard Ellis is a 67 y.o. male presents to ED with severe posterior headache since yesterday at 3 pm.  Also central vision loss.  Unable to read.  No N/V, weakness.  No photophobia.  No h/o migraines or similar episodes.  In ED, CT brain showed no infarct but could not r/o basilar artery thrombus.  Still with headache, but vision back to normal.  MRI showed small right occipital cortical infarct.  MRA showed basilar ectasia.  Neuro has consulted.  Patient not a candidate for TPA due to delay in presentation.  Takes ASA 325 mg "about every other day"  Review of Systems: systems reviewed.  As above.  Otherwise negative  Past Medical History  Diagnosis Date  . Hyperlipidemia   . Osteoarthritis   . Allergy     rhinitis  . Bronchitis   . Hypothyroidism    Past Surgical History  Procedure Laterality Date  . Tonsillectomy      when he was younger  . Esophagogastroduodenoscopy N/A 10/26/2012    Procedure: ESOPHAGOGASTRODUODENOSCOPY (EGD);  Surgeon: Vertell Novak., MD;  Location: [facility] ENDOSCOPY;  Service: Endoscopy;  Laterality: N/A;   Social History:  reports that he has never smoked. He has never used smokeless tobacco. He reports that he drinks about 4.2 ounces of alcohol per week. He reports that he does not use illicit drugs. Married.  Avid runner.  Allergies  Allergen Reactions  . Atorvastatin Other (See Comments)    Muscle weakness  . Demerol [Meperidine]     sick    Family History  Problem Relation Age of Onset  . Coronary artery disease Other     male first degree relative <60  . Heart disease Father     heart attack  . Colon cancer Neg Hx     Prior to Admission medications   Medication Sig Start Date End Date Taking? Authorizing  Provider  aspirin 325 MG tablet Take 325 mg by mouth daily.   Yes Historical Provider, MD  Cholecalciferol 2000 UNITS CAPS Take 1 capsule by mouth daily.     Yes Historical Provider, MD  ibuprofen (ADVIL,MOTRIN) 200 MG tablet Take 200 mg by mouth every 6 (six) hours as needed for pain (pain).   Yes Historical Provider, MD  levothyroxine (SYNTHROID, LEVOTHROID) 25 MCG tablet TAKE ONE TABLET BY MOUTH ONE TIME DAILY 04/07/13  Yes Tresa Garter, MD  Magnesium 200 MG TABS Take 1 tablet by mouth.   Yes Historical Provider, MD  Mometasone Furo-Formoterol Fum (DULERA) 100-5 MCG/ACT AERO Inhale into the lungs 2 (two) times daily.     Yes Historical Provider, MD  Multiple Vitamin (MULTIVITAMIN WITH MINERALS) TABS Take 1 tablet by mouth every other day.   Yes Historical Provider, MD  niacin 500 MG tablet Take 250 mg by mouth at bedtime as needed.    Yes Historical Provider, MD  omega-3 acid ethyl esters (LOVAZA) 1 G capsule Take 2 capsules (2 g total) by mouth 2 (two) times daily. 01/29/13  Yes Georgina Quint Plotnikov, MD  sildenafil (VIAGRA) 100 MG tablet Take 1 tablet (100 mg total) by mouth as needed for erectile dysfunction. 08/11/12   Tresa Garter, MD   Physical Exam: Filed Vitals:   07/02/13 1403  BP: 109/64  Pulse: 42  Temp:  Resp:     BP 123/51  Pulse 40  Temp(Src) 98.8 F (37.1 C) (Oral)  Resp 17  Ht 6' 1.5" (1.867 m)  Wt 79.833 kg (176 lb)  BMI 22.9 kg/m2  SpO2 100%  General Appearance:    Alert, cooperative, no distress, appears stated age  Head:    Normocephalic, without obvious abnormality, atraumatic  Eyes:    PERRL, conjunctiva/corneas clear, EOM's intact, fundi    benign, both eyes          Nose:   Nares normal, septum midline, mucosa normal, no drainage   or sinus tenderness  Throat:   Lips, mucosa, and tongue normal; teeth and gums normal  Neck:   Supple, symmetrical, trachea midline, no adenopathy;       thyroid:  No enlargement/tenderness/nodules; no carotid    bruit or JVD  Back:     Symmetric, no curvature, ROM normal, no CVA tenderness  Lungs:     Clear to auscultation bilaterally, respirations unlabored  Chest wall:    No tenderness or deformity  Heart:    Regular rate and rhythm, S1 and S2 normal, no murmur, rub   or gallop  Abdomen:     Soft, non-tender, bowel sounds active all four quadrants,    no masses, no organomegaly  Genitalia:   deferred  Rectal:   deferred  Extremities:   Extremities normal, atraumatic, no cyanosis or edema  Pulses:   2+ and symmetric all extremities  Skin:   Skin color, texture, turgor normal, no rashes or lesions  Lymph nodes:   Cervical, supraclavicular, and axillary nodes normal  Neurologic:   CNII-XII intact. Normal strength, sensation and reflexes      Throughout. Gait normal.  rhomberg negative.  Finger to nose normal    Psych:  Normal affect  Labs on Admission:  Basic Metabolic Panel:  Recent Labs Lab 07/02/13 0829  NA 135  K 3.8  CL 101  CO2 27  GLUCOSE 99  BUN 24*  CREATININE 1.07  CALCIUM 8.9   Liver Function Tests:  Recent Labs Lab 07/02/13 0829  AST 31  ALT 24  ALKPHOS 44  BILITOT 0.5  PROT 6.3  ALBUMIN 3.5   No results found for this basename: LIPASE, AMYLASE,  in the last 168 hours No results found for this basename: AMMONIA,  in the last 168 hours CBC:  Recent Labs Lab 07/02/13 0829  WBC 5.4  NEUTROABS 3.0  HGB 12.8*  HCT 35.4*  MCV 91.0  PLT 145*   Cardiac Enzymes:  Recent Labs Lab 07/02/13 0829  TROPONINI <0.30    BNP (last 3 results) No results found for this basename: PROBNP,  in the last 8760 hours CBG:  Recent Labs Lab 07/02/13 0843  GLUCAP 97    Radiological Exams on Admission: Ct Head Wo Contrast  07/02/2013   CLINICAL DATA:  Nausea. Headache. Dizziness since yesterday. Episode of the visual difficulty.  EXAM: CT HEAD WITHOUT CONTRAST  TECHNIQUE: Contiguous axial images were obtained from the base of the skull through the vertex without  intravenous contrast.  COMPARISON:  None.  FINDINGS: No intracranial hemorrhage.  Basilar tip appears slightly dense. This may be a normal finding. Thrombus in the basilar tip not entirely excluded although felt to be less likely consideration.  Streak artifact through the upper medulla limits evaluation at this level. Otherwise no CT evidence of large acute infarct. If posterior fossa infarct is of high clinical concern, MR recommended.  No hydrocephalus.  No  intracranial mass lesion noted on this unenhanced exam.  Orbital structures appear grossly intact.  IMPRESSION: No intracranial hemorrhage.  Basilar tip appears slightly dense. This may be a normal finding. Thrombus in the basilar tip not entirely excluded although felt to be less likely consideration.  Streak artifact through the upper medulla limits evaluation at this level. Otherwise no CT evidence of large acute infarct.  Results discussed with Roxy Horseman physician's assistant.   Electronically Signed   By: Bridgett Larsson M.D.   On: 07/02/2013 08:54    EKG: Sinus bradycardia Borderline right axis deviation Nonspecific T abnrm, anterolateral leads  Assessment/Plan Principal Problem:   Acute ischemic stroke, right occipital.  Symptoms resolved.  Tele, carotids, echo, lipids, neuro checks. Continue ASA f Active Problems:   HYPOTHYROIDISM   HYPERLIPIDEMIA   Chronic sinus bradycardia  Time spent: 60 min  Isabellamarie Randa L [facility] Pager [phone]  If 7PM-7AM, please contact night-coverage [email or web address] Password [email or web address] 07/02/2013, 3:23 PM

## 2013-07-02 NOTE — ED Provider Notes (Signed)
CSN: [id number]     Arrival date & time 07/02/13  0750 History   First MD Initiated Contact with Patient 07/02/13 0755     Chief Complaint  Patient presents with  . Nausea   (Consider location/radiation/quality/duration/timing/severity/associated sxs/prior Treatment) HPI Comments: Patient presents to the emergency department with chief complaint of central vision loss x1 day. Patient also complains of associated headache and nausea. Patient states the symptoms began yesterday while he was looking at a computer screen, he states that it was as though a "black hole appeared in his vision." He states that his peripheral vision was still intact. Last known normal at 3:00 yesterday. He states that the symptoms remained throughout the rest of the day and evening. Patient states that he went to sleep, and upon awakening this morning, his vision was better, but his headache remained. Patient states that the headache is mostly felt in the posterior region. Patient states that he has bumped his head several times working on some cabinets. 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  The history is provided by the patient. No language interpreter was used.    Past Medical History  Diagnosis Date  . Hyperlipidemia   . Osteoarthritis   . Allergy     rhinitis  . Bronchitis    Past Surgical History  Procedure Laterality Date  . Tonsillectomy      when he was younger  . Esophagogastroduodenoscopy N/A 10/26/2012    Procedure: ESOPHAGOGASTRODUODENOSCOPY (EGD);  Surgeon: Vertell Novak., MD;  Location: [facility] ENDOSCOPY;  Service: Endoscopy;  Laterality: N/A;   Family History  Problem Relation Age of Onset  . Coronary artery disease Other     male first degree relative <60  . Heart disease Father     heart attack  . Colon cancer Neg Hx    History  Substance Use Topics  . Smoking status: Never Smoker   . Smokeless tobacco: Never Used   . Alcohol Use: 4.2 oz/week    7 Glasses of wine per week    Review of Systems  All other systems reviewed and are negative.    Allergies  Atorvastatin  Home Medications   Current Outpatient Rx  Name  Route  Sig  Dispense  Refill  . aspirin 325 MG tablet   Oral   Take 325 mg by mouth daily.         . Cholecalciferol 2000 UNITS CAPS   Oral   Take 1 capsule by mouth daily.           [name] levothyroxine (SYNTHROID, LEVOTHROID) 25 MCG tablet      TAKE ONE TABLET BY MOUTH ONE TIME DAILY   30 tablet   5   . Magnesium 200 MG TABS   Oral   Take 1 tablet by mouth.         . Mometasone Furo-Formoterol Fum (DULERA) 100-5 MCG/ACT AERO   Inhalation   Inhale into the lungs 2 (two) times daily.           . Multiple Vitamin (MULTIVITAMIN WITH MINERALS) TABS   Oral   Take 1 tablet by mouth every other day.         . niacin 500 MG tablet   Oral   Take 500 mg by mouth at bedtime as needed.           [name] omega-3 acid ethyl esters (LOVAZA) 1 G capsule  Oral   Take 2 capsules (2 g total) by mouth 2 (two) times daily.   360 capsule   3   . sildenafil (VIAGRA) 100 MG tablet   Oral   Take 1 tablet (100 mg total) by mouth as needed for erectile dysfunction.   12 tablet   5    There were no vitals taken for this visit. Physical Exam  Nursing note and vitals reviewed. Constitutional: He is oriented to person, place, and time. He appears well-developed and well-nourished.  HENT:  Head: Normocephalic and atraumatic.  Eyes: Conjunctivae and EOM are normal. Pupils are equal, round, and reactive to light. Right eye exhibits no discharge. Left eye exhibits no discharge. No scleral icterus.  Neck: Normal range of motion. Neck supple. No JVD present.  Cardiovascular: Normal rate, regular rhythm, normal heart sounds and intact distal pulses.  Exam reveals no gallop and no friction rub.   No murmur heard. Pulmonary/Chest: Effort normal and breath sounds normal. No respiratory  distress. He has no wheezes. He has no rales. He exhibits no tenderness.  Abdominal: Soft. Bowel sounds are normal. He exhibits no distension and no mass. There is no tenderness. There is no rebound and no guarding.  Musculoskeletal: Normal range of motion. He exhibits no edema and no tenderness.  Neurological: He is alert and oriented to person, place, and time. He has normal reflexes.  CN 3-12 intact  Skin: Skin is warm and dry.  Psychiatric: He has a normal mood and affect. His behavior is normal. Judgment and thought content normal.    ED Course  Procedures (including critical care time) Results for orders placed during the hospital encounter of 07/02/13  CBC WITH DIFFERENTIAL      Result Value Range   WBC 5.4  4.0 - 10.5 K/uL   RBC 3.89 (*) 4.22 - 5.81 MIL/uL   Hemoglobin 12.8 (*) 13.0 - 17.0 g/dL   HCT 16.1 (*) 09.6 - 04.5 %   MCV 91.0  78.0 - 100.0 fL   MCH 32.9  26.0 - 34.0 pg   MCHC 36.2 (*) 30.0 - 36.0 g/dL   RDW 40.9  81.1 - 91.4 %   Platelets 145 (*) 150 - 400 K/uL   Neutrophils Relative % 56  43 - 77 %   Neutro Abs 3.0  1.7 - 7.7 K/uL   Lymphocytes Relative 29  12 - 46 %   Lymphs Abs 1.6  0.7 - 4.0 K/uL   Monocytes Relative 12  3 - 12 %   Monocytes Absolute 0.6  0.1 - 1.0 K/uL   Eosinophils Relative 3  0 - 5 %   Eosinophils Absolute 0.2  0.0 - 0.7 K/uL   Basophils Relative 0  0 - 1 %   Basophils Absolute 0.0  0.0 - 0.1 K/uL  ETHANOL      Result Value Range   Alcohol, Ethyl (B) <11  0 - 11 mg/dL  PROTIME-INR      Result Value Range   Prothrombin Time 13.0  11.6 - 15.2 seconds   INR 1.00  0.00 - 1.49  APTT      Result Value Range   aPTT 29  24 - 37 seconds  COMPREHENSIVE METABOLIC PANEL      Result Value Range   Sodium 135  135 - 145 mEq/L   Potassium 3.8  3.5 - 5.1 mEq/L   Chloride 101  96 - 112 mEq/L   CO2 27  19 - 32 mEq/L  Glucose, Bld 99  70 - 99 mg/dL   BUN 24 (*) 6 - 23 mg/dL   Creatinine, Ser 4.09  0.50 - 1.35 mg/dL   Calcium 8.9  8.4 - 81.1  mg/dL   Total Protein 6.3  6.0 - 8.3 g/dL   Albumin 3.5  3.5 - 5.2 g/dL   AST 31  0 - 37 U/L   ALT 24  0 - 53 U/L   Alkaline Phosphatase 44  39 - 117 U/L   Total Bilirubin 0.5  0.3 - 1.2 mg/dL   GFR calc non Af Amer 70 (*) >90 mL/min   GFR calc Af Amer 81 (*) >90 mL/min  TROPONIN I      Result Value Range   Troponin I <0.30  <0.30 ng/mL  GLUCOSE, CAPILLARY      Result Value Range   Glucose-Capillary 97  70 - 99 mg/dL  POCT I-STAT TROPONIN I      Result Value Range   Troponin i, poc 0.01  0.00 - 0.08 ng/mL   Comment 3            Ct Head Wo Contrast  07/02/2013   CLINICAL DATA:  Nausea. Headache. Dizziness since yesterday. Episode of the visual difficulty.  EXAM: CT HEAD WITHOUT CONTRAST  TECHNIQUE: Contiguous axial images were obtained from the base of the skull through the vertex without intravenous contrast.  COMPARISON:  None.  FINDINGS: No intracranial hemorrhage.  Basilar tip appears slightly dense. This may be a normal finding. Thrombus in the basilar tip not entirely excluded although felt to be less likely consideration.  Streak artifact through the upper medulla limits evaluation at this level. Otherwise no CT evidence of large acute infarct. If posterior fossa infarct is of high clinical concern, MR recommended.  No hydrocephalus.  No intracranial mass lesion noted on this unenhanced exam.  Orbital structures appear grossly intact.  IMPRESSION: No intracranial hemorrhage.  Basilar tip appears slightly dense. This may be a normal finding. Thrombus in the basilar tip not entirely excluded although felt to be less likely consideration.  Streak artifact through the upper medulla limits evaluation at this level. Otherwise no CT evidence of large acute infarct.  Results discussed with Roxy Horseman physician's assistant.   Electronically Signed   By: Bridgett Larsson M.D.   On: 07/02/2013 08:54     EKG Interpretation     Ventricular Rate:  46 PR Interval:  178 QRS Duration: 104 QT  Interval:  482 QTC Calculation: 422 R Axis:   87 Text Interpretation:  Sinus bradycardia Borderline right axis deviation Nonspecific T abnrm, anterolateral leads diffuse ST elevation noted            MDM   1. Headache   2. Vision loss    Patient with central vision loss and headache x1 day. Vision loss is resolved. Discussed patient with Dr. Bebe Shaggy, who agrees with plan see order a head CT.  CT shows some concerning evidence, and radiology recommends further rule out with MRI/MRA. Patient discussed with Dr. [name] from neurology, who will be available for consult after imaging.  Patient discussed with Dr. Lendell Caprice from [facility], who will admit the patient for TIA/stroke rule out.    Roxy Horseman, PA-C 07/02/13 [phone]

## 2013-07-02 NOTE — ED Provider Notes (Signed)
Patient seen/examined in the Emergency Department in conjunction with Midlevel Provider [facility] Patient reports headache and central vision loss yesterday, vision now at baseline Exam : awake/alert, no arm drift, no facial droop.  No visual loss noted Pt denies CP.  Reports only HA at this time Plan: will need admission and neuro consult  tPA in stroke considered but not given due to:  Onset over 3-4.5hours Symptoms resolving    Joya Gaskins, MD 07/02/13 [phone]

## 2013-07-02 NOTE — ED Notes (Signed)
Pt. oob

## 2013-07-02 NOTE — Progress Notes (Signed)
*  PRELIMINARY RESULTS* Vascular Ultrasound Carotid Duplex (Doppler) has been completed.  Findings suggest 1-39% internal carotid artery stenosis bilaterally. Vertebral arteries are patent with antegrade flow bilaterally.  07/02/2013 6:07 PM Gertie Fey, RVT, RDCS, RDMS

## 2013-07-02 NOTE — ED Notes (Addendum)
Pt. Developed a headache yesterday and lost his central vision.   Could not see the computer screen, which lasted hours.  Presently vision is okay. Headache has increased and nausea  Pt. Did hit his head a few times  On a cabinet while fixing a washer.  Pt. Also has a decreased appetite.  Pt. Is alert and oriented X4.  [name]

## 2013-07-03 DIAGNOSIS — I495 Sick sinus syndrome: Secondary | ICD-10-CM

## 2013-07-03 DIAGNOSIS — H547 Unspecified visual loss: Secondary | ICD-10-CM

## 2013-07-03 DIAGNOSIS — E785 Hyperlipidemia, unspecified: Secondary | ICD-10-CM

## 2013-07-03 DIAGNOSIS — I6350 Cerebral infarction due to unspecified occlusion or stenosis of unspecified cerebral artery: Secondary | ICD-10-CM

## 2013-07-03 LAB — LIPID PANEL
Cholesterol: 253 mg/dL — ABNORMAL HIGH (ref 0–200)
HDL: 52 mg/dL (ref >39)
LDL Cholesterol: 177 mg/dL — ABNORMAL HIGH (ref 0–99)
Total CHOL/HDL Ratio: 4.9 RATIO
Triglycerides: 118 mg/dL (ref <150)
VLDL: 24 mg/dL (ref 0–40)

## 2013-07-03 LAB — HEMOGLOBIN A1C
Hgb A1c MFr Bld: 6.1 % — ABNORMAL HIGH (ref <5.7)
Mean Plasma Glucose: 128 mg/dL — ABNORMAL HIGH (ref <117)

## 2013-07-03 MED ORDER — ROSUVASTATIN CALCIUM 5 MG PO TABS: 5.0000 mg | ORAL_TABLET | Freq: Every day | ORAL | Status: DC

## 2013-07-03 MED ORDER — CLOPIDOGREL BISULFATE 75 MG PO TABS: 75.0000 mg | ORAL_TABLET | Freq: Every day | ORAL | Status: DC

## 2013-07-03 MED ORDER — SIMVASTATIN 20 MG PO TABS
20.0000 mg | ORAL_TABLET | Freq: Every day | ORAL | Status: DC
  Filled 2013-07-03: qty 1

## 2013-07-03 NOTE — Progress Notes (Signed)
Stroke Team Progress Note  HISTORY Richard Ellis is an 67 y.o. male with a history of hyperlipidemia who experienced sudden onset of central vision at about 3:00 yesterday afternoon. He said no previous symptoms of this sort. Onset of a headache at around the same time. Visual changes lasted for several hours then resolved. Headache persisted and became worse overnight. Headache was severe when he woke up this morning which prompted his desire for medical evaluation. There was no focal weakness nor focal numbness involving face or extremities other than slight numbness involving his left foot when he woke up this morning. No speech changes were noted. He was given morphine in the emergency room which greatly improved his headache symptoms. CT scan of his head showed no acute intracranial abnormality. MRI shows a small right occipital lobe cortical infarct.  Of note he did hit the back of his head "not hard" over the past 2 days with bending over.    LSN: 3 PM on 07/01/2013  tPA Given: No: Beyond time window for treatment consideration; deficits resolved prior to presentation.  MRankin: 0  Patient was not a TPA candidate secondary to resolution of deficits. He was admitted for further evaluation and treatment.  SUBJECTIVE His family at the bedside.  Overall he feels his condition is completely resolved. Systolic BP ~ 100. Very active, runner. Family concerns of dehydration  OBJECTIVE Most recent Vital Signs: Filed Vitals:   07/02/13 2247 07/03/13 0227 07/03/13 0508 07/03/13 1024  BP: 106/55 103/52 120/60 99/61  Pulse: 42 39 41 43  Temp: 98.1 F (36.7 C) 98.3 F (36.8 C) 97.9 F (36.6 C) 98.1 F (36.7 C)  TempSrc: Oral Oral Oral Oral  Resp: 18 18 18 18   Height:      Weight:      SpO2: 100% 97% 99% 98%   CBG (last 3)   Recent Labs  07/02/13 0843  GLUCAP 97    IV Fluid Intake:     MEDICATIONS  . aspirin  325 mg Oral Daily  . enoxaparin (LOVENOX) injection  40 mg Subcutaneous  Q24H  . levothyroxine  25 mcg Oral QAC breakfast  . omega-3 acid ethyl esters  2 g Oral BID  . simvastatin  20 mg Oral q1800   PRN:  acetaminophen, acetaminophen, HYDROcodone-acetaminophen  Diet:  Cardiac thin liquids Activity:  Activity as tolerated DVT Prophylaxis:  lovenox  CLINICALLY SIGNIFICANT STUDIES Basic Metabolic Panel:  Recent Labs Lab 07/02/13 0829  NA 135  K 3.8  CL 101  CO2 27  GLUCOSE 99  BUN 24*  CREATININE 1.07  CALCIUM 8.9   Liver Function Tests:  Recent Labs Lab 07/02/13 0829  AST 31  ALT 24  ALKPHOS 44  BILITOT 0.5  PROT 6.3  ALBUMIN 3.5   CBC:  Recent Labs Lab 07/02/13 0829  WBC 5.4  NEUTROABS 3.0  HGB 12.8*  HCT 35.4*  MCV 91.0  PLT 145*   Coagulation:  Recent Labs Lab 07/02/13 0829  LABPROT 13.0  INR 1.00   Cardiac Enzymes:  Recent Labs Lab 07/02/13 0829  TROPONINI <0.30   Urinalysis:  Recent Labs Lab 07/02/13 0922  COLORURINE YELLOW  LABSPEC 1.022  PHURINE 5.5  GLUCOSEU NEGATIVE  HGBUR NEGATIVE  BILIRUBINUR NEGATIVE  KETONESUR NEGATIVE  PROTEINUR NEGATIVE  UROBILINOGEN 0.2  NITRITE NEGATIVE  LEUKOCYTESUR NEGATIVE   Lipid Panel    Component Value Date/Time   CHOL 253* 07/03/2013 0512   TRIG 118 07/03/2013 0512   HDL 52 07/03/2013 0512  CHOLHDL 4.9 07/03/2013 0512   VLDL 24 07/03/2013 0512   LDLCALC 177* 07/03/2013 0512   HgbA1C  Lab Results  Component Value Date   HGBA1C 5.8 04/02/2008    Urine Drug Screen:     Component Value Date/Time   LABOPIA POSITIVE* 07/02/2013 0922   COCAINSCRNUR NONE DETECTED 07/02/2013 0922   LABBENZ NONE DETECTED 07/02/2013 0922   AMPHETMU NONE DETECTED 07/02/2013 0922   THCU NONE DETECTED 07/02/2013 0922   LABBARB NONE DETECTED 07/02/2013 0922    Alcohol Level:  Recent Labs Lab 07/02/13 0829  ETH <11    Ct Head Wo Contrast 07/02/2013  : No intracranial hemorrhage.  Basilar tip appears slightly dense. This may be a normal finding. Thrombus in the basilar tip  not entirely excluded although felt to be less likely consideration.  Streak artifact through the upper medulla limits evaluation at this level  Mr Angiogram Head W Contrast 07/02/2013    1. Ectasia of the basilar artery tip without discrete basilar tip aneurysm, corresponding to the hyperdense appearance of the basilar artery on the earlier CT.  2. Otherwise negative intracranial MRA. Right PCA branches are within normal limits.    Mr Brain Wo Contrast 07/02/2013    1. Small right occipital lobe cortical infarct. No mass effect or hemorrhage.  2. Otherwise normal for age non contrast MRI appearance of the brain.    2D Echocardiogram    Carotid Doppler    CXR    EKG  sinus bradycardia.   Therapy Recommendations NONE  Physical Exam   Neurologic Examination:  Mental Status:  Alert, oriented, thought content appropriate. Speech fluent without evidence of aphasia. Able to follow commands without difficulty.  Cranial Nerves:  II-Visual fields were normal.  III/IV/VI-Pupils were equal and reacted. Extraocular movements were full and conjugate.  V/VII-no facial numbness and no facial weakness.  VIII-normal.  X-normal speech and symmetrical palatal movement.  Motor: 5/5 bilaterally with normal tone and bulk  Sensory: Normal throughout.  Deep Tendon Reflexes: 2+ and symmetric.  Plantars: Flexor bilaterally  Cerebellar: Normal finger-to-nose testing.  Carotid auscultation: Normal  ASSESSMENT [name] is a 67 y.o. male presenting with headache, central vision loss (left eye patient thinks). Imaging confirms a small right occipital lobe cortical infarct. Infarct felt to be embolic secondary to unknown source.  On aspirin 325 mg orally daily (maybe every other day) prior to admission. Now on aspirin 325 mg orally every day for secondary stroke prevention. Patient with resultant resolution of symptoms.    Hyperlipidemia, LDL 177, goal < 100, add statin  Hospital day #  1  TREATMENT/PLAN  Change to clopidogrel 75 mg orally every day for secondary stroke prevention.  Recommend TEE as outpatient along with outpatient telemetry monitoring.  Symptoms resolved; vision changes resolved: ok to drive.  Patient has had symptoms with statins; however is willing to try low dose crestor 5mg  daily  Please schedule an outpatient TCD bubble study with emboli monitoring with Dr. Pearlean Brownie in 1 month to further evaluate for possible PFO. Have patient call for appointment.   These tests have been reviewed with patient and wife.  Therapy evaluations: none completed as symptoms resolved to where it was not felt to be necessary.   Gwendolyn Lima. Manson Passey, [facility], MBA, MHA [facility] Pager: [phone] 07/03/2013 2:57 PM  I have personally obtained a history, examined the patient, evaluated imaging results, and formulated the assessment and plan of care. I agree with the above. Delia Heady,  MD

## 2013-07-03 NOTE — Progress Notes (Signed)
Patient discharging home with instructions given to follow up with [facility] cardiology and Dr. Pearlean Brownie.  IV removed and patient education complete.  Lance Bosch, RN

## 2013-07-03 NOTE — Discharge Summary (Signed)
Physician Discharge Summary  Richard Ellis ZOX:[id number] DOB: Jun 30, 1946 DOA: 07/02/2013  PCP: Sonda Primes, MD  Admit date: 07/02/2013 Discharge date: 07/03/2013  Time spent: > 35 minutes  Recommendations for Outpatient Follow-up:  1. Needs to follow up with [facility] Cardiology for TEE and holter monitoring 2. Also will need to f/u with Neurologist for TCD bubble study with Dr. Pearlean Brownie in 1 month.  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   Hospital Course:  1. CVA - MRI of brain reported small right occipital lobe cortical infarct.  Otherwise normal for age non contrast MRI appearance of the brain. - MRA Otherwise negative intracranial MRA. Right PCA branches are within normal limits. Ectasia of the basilar artery tip without discrete basilar tip  aneurysm, corresponding to the hyperdense appearance of the basilar  artery on the earlier CT. - Neurology on board and recommended: Change to clopidogrel 75 mg orally every day for secondary stroke prevention. Recommend TEE as outpatient along with outpatient telemetry monitoring. Symptoms resolved; vision changes resolved: ok to drive. Patient has had symptoms with statins; however is willing to try low dose crestor 5mg  daily Please schedule an outpatient TCD bubble study with emboli monitoring with Dr. Pearlean Brownie in 1 month to further evaluate for possible PFO. Have patient call for appointment. These tests have been reviewed with patient and wife.  2. Hypothyroidism - Stable continue home regimen  3. HPL - Lovaza - Start Statin, Crestor at low dose given  elevated LDL of 177  Procedures: MRI of brain MRA of head CT of head TTE  Consultations:  Neurology  Discharge Exam: Filed Vitals:   07/03/13 1024  BP: 99/61  Pulse: 43  Temp: 98.1 F (36.7 C)  Resp: 18    General: Pt in NAD, Alert and Awake Cardiovascular: Normal S1 and S2 , no MRG Respiratory: CTA BL, no wheezes  Discharge Instructions  Discharge Orders   Future Appointments Provider Department Dept Phone   08/06/2013 9:30 AM Tresa Garter, MD [facility] -[place] [phone]   Future Orders Complete By Expires   Call MD for:  difficulty breathing, headache or visual disturbances  As directed    Call MD for:  temperature >100.4  As directed    Diet - low sodium heart healthy  As directed    Discharge instructions  As directed    Comments:     You will need to follow up with [facility] cardiology for holter monitoring and TEE (trans esophageal echocardiogram).  Also will need to follow up with Neurology in 1 month for TCD study   Increase activity slowly  As directed        Medication List    STOP taking these medications       aspirin 325 MG tablet     sildenafil 100 MG tablet  Commonly known as:  VIAGRA      TAKE these medications       Cholecalciferol 2000 UNITS Caps  Take 1 capsule by mouth daily.     clopidogrel 75 MG tablet  Commonly known as:  PLAVIX  Take 1  tablet (75 mg total) by mouth daily.     DULERA 100-5 MCG/ACT Aero  Generic drug:  mometasone-formoterol  Inhale into the lungs 2 (two) times daily.     ibuprofen 200 MG tablet  Commonly known as:  ADVIL,MOTRIN  Take 200 mg by mouth every 6 (six) hours as needed for pain (pain).     levothyroxine 25 MCG tablet  Commonly known as:  SYNTHROID, LEVOTHROID  TAKE ONE TABLET BY MOUTH ONE TIME DAILY     Magnesium 200 MG Tabs  Take 1 tablet by mouth.     multivitamin with minerals Tabs tablet  Take 1 tablet by mouth every other day.     niacin 500 MG tablet   Take 250 mg by mouth at bedtime as needed.     omega-3 acid ethyl esters 1 G capsule  Commonly known as:  LOVAZA  Take 2 capsules (2 g total) by mouth 2 (two) times daily.     rosuvastatin 5 MG tablet  Commonly known as:  CRESTOR  Take 1 tablet (5 mg total) by mouth daily.       Allergies  Allergen Reactions  . Atorvastatin Other (See Comments)    Muscle weakness  . Demerol [Meperidine]     sick      The results of significant diagnostics from this hospitalization (including imaging, microbiology, ancillary and laboratory) are listed below for reference.    Significant Diagnostic Studies: Ct Head Wo Contrast  07/02/2013   CLINICAL DATA:  Nausea. Headache. Dizziness since yesterday. Episode of the visual difficulty.  EXAM: CT HEAD WITHOUT CONTRAST  TECHNIQUE: Contiguous axial images were obtained from the base of the skull through the vertex without intravenous contrast.  COMPARISON:  None.  FINDINGS: No intracranial hemorrhage.  Basilar tip appears slightly dense. This may be a normal finding. Thrombus in the basilar tip not entirely excluded although felt to be less likely consideration.  Streak artifact through the upper medulla limits evaluation at this level. Otherwise no CT evidence of large acute infarct. If posterior fossa infarct is of high clinical concern, MR recommended.  No hydrocephalus.  No intracranial mass lesion noted on this unenhanced exam.  Orbital structures appear grossly intact.  IMPRESSION: No intracranial hemorrhage.  Basilar tip appears slightly dense. This may be a normal finding. Thrombus in the basilar tip not entirely excluded although felt to be less likely consideration.  Streak artifact through the upper medulla limits evaluation at this level. Otherwise no CT evidence of large acute infarct.  Results discussed with Roxy Horseman physician's assistant.   Electronically Signed   By: Bridgett Larsson M.D.   On: 07/02/2013 08:54   Mr Angiogram Head W  Contrast  07/02/2013   CLINICAL DATA:  67 year old male with blurred vision and headache. Subsequent nausea and vomiting.  EXAM: MRI HEAD WITHOUT CONTRAST  MRA HEAD WITHOUT CONTRAST  TECHNIQUE: Multiplanar, multiecho pulse sequences of the brain and surrounding structures were obtained without intravenous contrast. Angiographic images of the head were obtained using MRA technique without contrast.  COMPARISON:  Head CT without contrast 07/02/2013.  FINDINGS: MRI HEAD FINDINGS  There is a small 8 mm focus of restricted diffusion in the right occipital lobe on series 4, image 13. This appears to be cortically based inferior to the calcar and sulcus. Subtle associated T2 and FLAIR cortical hyperintensity. No evidence of associated hemorrhage. No mass effect.  No other restricted diffusion. Major intracranial vascular flow voids are preserved. Ectatic basilar artery tip. See below.  Cerebral volume is within normal limits for age. No midline shift, mass effect, evidence of mass lesion, ventriculomegaly, extra-axial collection or acute intracranial hemorrhage. Cervicomedullary junction and pituitary are within normal limits. Negative visualized cervical spine. Normal for age gray and white matter signal outside of the right occipital lobe. Visualized orbit soft tissues are within normal limits. Visible internal auditory structures appear normal. Mastoids are clear. Minor paranasal sinus mucosal thickening. Small Tornwaldt cyst. Normal bone marrow signal. Visualized scalp soft tissues are within normal limits.  MRA HEAD FINDINGS  Antegrade flow in the posterior circulation with fairly codominant distal vertebral arteries. Normal PICA origins. Mildly tortuous but otherwise normal vertebrobasilar junction. No basilar artery stenosis.  Mild to moderate generalized ectasia of the basilar artery tip measuring 5-6 mm diameter. The bilateral SCA and PCA origins are involved and are non stenotic. No discrete basilar tip  aneurysm.  Posterior communicating arteries are diminutive or absent. Bilateral PCA branches are within normal limits.  Antegrade flow in both ICA siphons. No ICA stenosis. Normal ophthalmic artery origins. Normal carotid termini, MCA and ACA origins.  Normal anterior communicating artery and visualized ACA branches. Normal visualized bilateral MCA branches.  IMPRESSION: MRI HEAD IMPRESSION  1. Small right occipital lobe cortical infarct. No mass effect or hemorrhage.  2. Otherwise normal for age non contrast MRI appearance of the brain.  MRA HEAD IMPRESSION  1. Ectasia of the basilar artery tip without discrete basilar tip aneurysm, corresponding to the hyperdense appearance of the basilar artery on the earlier CT.  2. Otherwise negative intracranial MRA. Right PCA branches are within normal limits.   Electronically Signed   By: Augusto Gamble M.D.   On: 07/02/2013 11:12   Mr Brain Wo Contrast  07/02/2013   CLINICAL DATA:  67 year old male with blurred vision and headache. Subsequent nausea and vomiting.  EXAM: MRI HEAD WITHOUT CONTRAST  MRA HEAD WITHOUT CONTRAST  TECHNIQUE: Multiplanar, multiecho pulse sequences of the brain and surrounding structures were obtained without intravenous contrast. Angiographic images of the head were obtained using MRA technique without contrast.  COMPARISON:  Head CT without contrast 07/02/2013.  FINDINGS: MRI HEAD FINDINGS  There is a small 8 mm focus of restricted diffusion in the right occipital lobe on series 4, image 13. This appears to be cortically based inferior to the calcar and sulcus. Subtle associated T2 and FLAIR cortical hyperintensity. No evidence of associated hemorrhage. No mass effect.  No other restricted diffusion. Major intracranial vascular flow voids are preserved. Ectatic basilar artery tip. See below.  Cerebral volume is within normal limits for age. No midline shift, mass effect, evidence of mass lesion, ventriculomegaly, extra-axial collection or acute  intracranial hemorrhage. Cervicomedullary junction and pituitary are within normal limits. Negative visualized cervical spine. Normal for age gray and white matter signal outside of the right occipital lobe. Visualized orbit soft tissues are within normal limits. Visible internal auditory structures appear normal. Mastoids are clear. Minor paranasal sinus mucosal thickening. Small Tornwaldt cyst. Normal bone marrow signal. Visualized scalp soft tissues are within normal limits.  MRA HEAD FINDINGS  Antegrade flow in the posterior circulation with fairly codominant distal vertebral arteries. Normal PICA origins. Mildly tortuous but otherwise normal vertebrobasilar junction. No basilar artery stenosis.  Mild to moderate generalized ectasia of the basilar artery tip measuring 5-6 mm diameter. The bilateral SCA and PCA origins are involved and are non stenotic. No discrete basilar tip aneurysm.  Posterior communicating arteries are diminutive or absent. Bilateral PCA branches are within normal  limits.  Antegrade flow in both ICA siphons. No ICA stenosis. Normal ophthalmic artery origins. Normal carotid termini, MCA and ACA origins.  Normal anterior communicating artery and visualized ACA branches. Normal visualized bilateral MCA branches.  IMPRESSION: MRI HEAD IMPRESSION  1. Small right occipital lobe cortical infarct. No mass effect or hemorrhage.  2. Otherwise normal for age non contrast MRI appearance of the brain.  MRA HEAD IMPRESSION  1. Ectasia of the basilar artery tip without discrete basilar tip aneurysm, corresponding to the hyperdense appearance of the basilar artery on the earlier CT.  2. Otherwise negative intracranial MRA. Right PCA branches are within normal limits.   Electronically Signed   By: Augusto Gamble M.D.   On: 07/02/2013 11:12    Microbiology: No results found for this or any previous visit (from the past 240 hour(s)).   Labs: Basic Metabolic Panel:  Recent Labs Lab 07/02/13 0829  NA 135   K 3.8  CL 101  CO2 27  GLUCOSE 99  BUN 24*  CREATININE 1.07  CALCIUM 8.9   Liver Function Tests:  Recent Labs Lab 07/02/13 0829  AST 31  ALT 24  ALKPHOS 44  BILITOT 0.5  PROT 6.3  ALBUMIN 3.5   No results found for this basename: LIPASE, AMYLASE,  in the last 168 hours No results found for this basename: AMMONIA,  in the last 168 hours CBC:  Recent Labs Lab 07/02/13 0829  WBC 5.4  NEUTROABS 3.0  HGB 12.8*  HCT 35.4*  MCV 91.0  PLT 145*   Cardiac Enzymes:  Recent Labs Lab 07/02/13 0829  TROPONINI <0.30   BNP: BNP (last 3 results) No results found for this basename: PROBNP,  in the last 8760 hours CBG:  Recent Labs Lab 07/02/13 0843  GLUCAP 97       Signed:  Penny Pia  [facility] 07/03/2013, 12:33 PM

## 2013-07-06 ENCOUNTER — Other Ambulatory Visit: Payer: Self-pay | Admitting: Gastroenterology

## 2013-07-06 ENCOUNTER — Telehealth: Payer: Self-pay | Admitting: Internal Medicine

## 2013-07-06 DIAGNOSIS — I639 Cerebral infarction, unspecified: Secondary | ICD-10-CM

## 2013-07-06 DIAGNOSIS — R001 Bradycardia, unspecified: Secondary | ICD-10-CM

## 2013-07-06 NOTE — Telephone Encounter (Signed)
The patient called the triage line and is hoping to get a heart monitor.  He wanted to get more information on how to obtain this.  Pt's callback - [phone]  Thanks!

## 2013-07-06 NOTE — Telephone Encounter (Signed)
We usually order hert monitors via Cardiology. What is the problem? Does he need to be seen? Thx

## 2013-07-06 NOTE — Addendum Note (Signed)
Addended by: Makaiyah Schweiger JR. on: 07/06/2013 09:17 AM   Modules accepted: Orders  

## 2013-07-07 ENCOUNTER — Other Ambulatory Visit: Payer: Self-pay | Admitting: *Deleted

## 2013-07-07 MED ORDER — CLOPIDOGREL BISULFATE 75 MG PO TABS: 75.0000 mg | ORAL_TABLET | Freq: Every day | ORAL | Status: DC

## 2013-07-07 MED ORDER — OMEGA-3-ACID ETHYL ESTERS 1 G PO CAPS: 2.0000 g | ORAL_CAPSULE | Freq: Two times a day (BID) | ORAL | Status: DC

## 2013-07-07 MED ORDER — ROSUVASTATIN CALCIUM 5 MG PO TABS: 5.0000 mg | ORAL_TABLET | Freq: Every day | ORAL | Status: DC

## 2013-07-07 NOTE — Telephone Encounter (Signed)
Pt was recently treated for stroke. Please see 07/02/13 A&P on discharge. This is where TEE and telemetry recommendations are. Pt is scheduled to see [place] 07/16/13. Please advise.

## 2013-07-08 ENCOUNTER — Encounter (HOSPITAL_COMMUNITY): Payer: Self-pay | Admitting: *Deleted

## 2013-07-08 ENCOUNTER — Encounter (HOSPITAL_COMMUNITY)
Admission: RE | Disposition: A | Discharge status: Home / Self Care | Payer: Self-pay | Source: Ambulatory Visit | Attending: Gastroenterology

## 2013-07-08 ENCOUNTER — Ambulatory Visit (HOSPITAL_COMMUNITY): Payer: Federal, State, Local not specified - PPO

## 2013-07-08 ENCOUNTER — Ambulatory Visit (HOSPITAL_COMMUNITY)
Admission: RE | Admit: 2013-07-08 | Discharge: 2013-07-08 | Disposition: A | Discharge status: Home / Self Care | Payer: Federal, State, Local not specified - PPO | Source: Ambulatory Visit | Attending: Gastroenterology | Admitting: Gastroenterology

## 2013-07-08 DIAGNOSIS — E039 Hypothyroidism, unspecified: Secondary | ICD-10-CM | POA: Insufficient documentation

## 2013-07-08 DIAGNOSIS — E785 Hyperlipidemia, unspecified: Secondary | ICD-10-CM | POA: Insufficient documentation

## 2013-07-08 DIAGNOSIS — Z8673 Personal history of transient ischemic attack (TIA), and cerebral infarction without residual deficits: Secondary | ICD-10-CM | POA: Insufficient documentation

## 2013-07-08 DIAGNOSIS — Z79899 Other long term (current) drug therapy: Secondary | ICD-10-CM | POA: Insufficient documentation

## 2013-07-08 DIAGNOSIS — I498 Other specified cardiac arrhythmias: Secondary | ICD-10-CM | POA: Insufficient documentation

## 2013-07-08 DIAGNOSIS — K222 Esophageal obstruction: Secondary | ICD-10-CM | POA: Insufficient documentation

## 2013-07-08 HISTORY — PX: SAVORY DILATION: SHX5439

## 2013-07-08 HISTORY — PX: ESOPHAGOGASTRODUODENOSCOPY: SHX5428

## 2013-07-08 SURGERY — EGD (ESOPHAGOGASTRODUODENOSCOPY)
Anesthesia: Moderate Sedation

## 2013-07-08 MED ORDER — BUTAMBEN-TETRACAINE-BENZOCAINE 2-2-14 % EX AERO
INHALATION_SPRAY | CUTANEOUS | Status: DC | PRN
  Administered 2013-07-08: 2 via TOPICAL

## 2013-07-08 MED ORDER — MIDAZOLAM HCL 10 MG/2ML IJ SOLN
INTRAMUSCULAR | Status: DC | PRN
  Administered 2013-07-08 (×2): 1 mg via INTRAVENOUS
  Administered 2013-07-08: 2 mg via INTRAVENOUS

## 2013-07-08 MED ORDER — FENTANYL CITRATE 0.05 MG/ML IJ SOLN
INTRAMUSCULAR | Status: DC | PRN
  Administered 2013-07-08 (×2): 25 ug via INTRAVENOUS

## 2013-07-08 MED ORDER — FENTANYL CITRATE 0.05 MG/ML IJ SOLN
INTRAMUSCULAR | Status: AC
  Filled 2013-07-08: qty 2

## 2013-07-08 MED ORDER — MIDAZOLAM HCL 10 MG/2ML IJ SOLN
INTRAMUSCULAR | Status: AC
  Filled 2013-07-08: qty 2

## 2013-07-08 MED ORDER — SODIUM CHLORIDE 0.9 % IV SOLN
INTRAVENOUS | Status: DC
  Administered 2013-07-08: 08:00:00 via INTRAVENOUS

## 2013-07-08 NOTE — Telephone Encounter (Signed)
Noted. Sorry about the stroke. TEE and Holter are both done through Cardiol consult - Does he have an appt w/Cardiologist? Should I sch one? Thx

## 2013-07-08 NOTE — Telephone Encounter (Signed)
No pt does not have Cardiology appoint, yes he needs to be scheduled one.  Rayfield Citizen, pts wife, is on the phone now requesting status.  She wants TEE and 30 day event monitor done ASAP.  Please refer to Cardiology.

## 2013-07-08 NOTE — Telephone Encounter (Signed)
Will do Thx 

## 2013-07-08 NOTE — Op Note (Signed)
[facility] [address] [place], [address]   ENDOSCOPY PROCEDURE REPORT  PATIENT: Richard, Ellis  MR#: [id number] BIRTHDATE: 1946-04-08 , 67  yrs. old GENDER: Male ENDOSCOPIST:Caelin Rosen Randa Evens, MD REFERRED BY:  Dr. Posey Rea PROCEDURE DATE:  07/08/2013 PROCEDURE:   EGD with Savary dilatation ASA CLASS:     class III due to recent TIA INDICATIONS: patient with recent massive food impaction. Has been on PPI therapy procedure has been discussed with him. MEDICATION:  fentanyl 50 mcg, versed 4 mg IV TOPICAL ANESTHETIC:    cetacaine spray  DESCRIPTION OF PROCEDURE:   The Pentax upper scope.was inserted into the esophagus with swallowing. The distal esophagus was reached and there was a moderate stricture. It was fairly patent. As opposed to the initial EGD following immediate action there was no inflammation after PPI therapy. The scope easily passed in the duodenum and stomach were examined were found to be normal. The Savary guidewire was placed to the scope in the scope withdrawn. With the patient's neck extended we passed Savary dilated is using fluoroscopic guidance. 14 mm, 15 mm, and 16 mm dollars were passed. Small amount of heme with 16mm dilator. A total of 24 seconds of flouro time is used. There were no immediate complications. It's notable the patient had a sinus bradycardia with pulse rate in the 30s to rose into the 50s during procedure.     COMPLICATIONS: None  ENDOSCOPIC IMPRESSION: 1. Esophageal Stricture. Dilated to 16 mm. Probably reflux 2. Sinus bradycardia  RECOMMENDATIONS: routine post dilatation instructions with liquids and soft foods for 24 hours. We'll continue PPI therapy. We will refer back to Dr.Plotnikov for further evaluation of this bradycardia. He will start Plavix is recommended by neurology in the a.m. for prevention of further TIAs.    _______________________________ Rosalie DoctorCarman Ching, MD 07/08/2013 9:54  AM  CC: Dr Avel Sensor, Dr Ledell Peoples

## 2013-07-08 NOTE — H&P (Signed)
Subjective:   Patient is a 66 y.o. male presents with need for esophageal dilatation. He has had previous food impaction requiring removal. He had a TIA a week or so ago told to start Plavix after recovery. He is due to follow-up with neurology as an outpatient. He asked and apparently was OK to hold his Plavix until after this procedure.. Procedure including risks and benefits discussed in office.  Patient Active Problem List   Diagnosis Date Noted  . Acute ischemic stroke 07/02/2013  . Chronic sinus bradycardia 07/02/2013  . Actinic keratoses 01/29/2013  . Elevated PSA 08/13/2012  . Well adult exam 08/11/2012  . HIP PAIN 11/14/2010  . GROIN PAIN 11/14/2010  . NEOPLASM OF UNCERTAIN BEHAVIOR OF SKIN 10/27/2010  . ALLERGIC RHINITIS 10/27/2010  . ASTHMA 10/27/2010  . OSTEOARTHRITIS 10/27/2010  . MYALGIA 10/27/2010  . LEG PAIN 12/23/2008  . FATIGUE 12/23/2008  . BRONCHITIS, ACUTE 10/15/2008  . COUGH 10/15/2008  . Rash and other nonspecific skin eruption 10/07/2008  . CBC, ABNORMAL 10/07/2008  . OTHER SPECIFIED ACQUIRED HYPOTHYROIDISM 11/04/2007  . OTHER ABNORMAL GLUCOSE 11/04/2007  . UPPER RESPIRATORY INFECTION (URI) 09/25/2007  . HYPOTHYROIDISM 07/10/2007  . HYPERLIPIDEMIA 07/10/2007   Past Medical History  Diagnosis Date  . Hyperlipidemia   . Allergy     rhinitis  . Bronchitis   . Hypothyroidism   . Heart murmur     "as a child" (07/02/2013)  . Chronic bronchitis     "q year" (07/02/2013)  . Anemia     "Hgb always on the low side" (07/02/2013)  . Esophageal stricture   . Stroke 07/02/2013    "small ischemic occipital right sided" (07/02/2013)  . Osteoarthritis     "maybe a little bit in my spine" (07/02/2013)    Past Surgical History  Procedure Laterality Date  . Esophagogastroduodenoscopy N/A 10/26/2012    Procedure: ESOPHAGOGASTRODUODENOSCOPY (EGD);  Surgeon: Vertell Novak., MD;  Location: [facility] ENDOSCOPY;  Service: Endoscopy;  Laterality: N/A;  .  Tonsillectomy  1950's    Prescriptions prior to admission  Medication Sig Dispense Refill  . Cholecalciferol 2000 UNITS CAPS Take 1 capsule by mouth daily.        [name] ibuprofen (ADVIL,MOTRIN) 200 MG tablet Take 200 mg by mouth every 6 (six) hours as needed for pain (pain).      [name] levothyroxine (SYNTHROID, LEVOTHROID) 25 MCG tablet TAKE ONE TABLET BY MOUTH ONE TIME DAILY  30 tablet  5  . Magnesium 200 MG TABS Take 1 tablet by mouth.      . Multiple Vitamin (MULTIVITAMIN WITH MINERALS) TABS Take 1 tablet by mouth every other day.      . niacin 500 MG tablet Take 250 mg by mouth at bedtime as needed.       [name] omega-3 acid ethyl esters (LOVAZA) 1 G capsule Take 2 capsules (2 g total) by mouth 2 (two) times daily.  360 capsule  3  . rosuvastatin (CRESTOR) 5 MG tablet Take 1 tablet (5 mg total) by mouth daily.  90 tablet  2  . clopidogrel (PLAVIX) 75 MG tablet Take 1 tablet (75 mg total) by mouth daily.  90 tablet  2  . Mometasone Furo-Formoterol Fum (DULERA) 100-5 MCG/ACT AERO Inhale into the lungs 2 (two) times daily.         Allergies  Allergen Reactions  . Atorvastatin Other (See Comments)    Muscle weakness  . Demerol [Meperidine]     sick    History  Substance Use Topics  . Smoking status: Never Smoker   . Smokeless tobacco: Never Used  . Alcohol Use: 4.8 oz/week    4 Cans of beer, 4 Glasses of wine per week     Comment: 07/02/2013 "glass of wine or a beer most nights of the week"    Family History  Problem Relation Age of Onset  . Coronary artery disease Other     male first degree relative <60  . Heart disease Father     heart attack  . Colon cancer Neg Hx      Objective:   Patient Vitals for the past 8 hrs:  BP Temp Temp src Pulse Resp SpO2  07/08/13 0923 139/76 mmHg - - 45 28 99 %  07/08/13 0809 120/84 mmHg 97.7 F (36.5 C) Oral - 16 100 %         See MD Preop evaluation      Assessment:   1. Esophageal stricture. Patient for dilatation  Plan:   EGD  with Savary dilatation under fluoroscopic guidance. Procedure has been discussed with the patient and his wife.

## 2013-07-09 ENCOUNTER — Other Ambulatory Visit: Payer: Self-pay | Admitting: Internal Medicine

## 2013-07-09 ENCOUNTER — Encounter (HOSPITAL_COMMUNITY): Payer: Self-pay | Admitting: Gastroenterology

## 2013-07-09 NOTE — Telephone Encounter (Signed)
Spoke with pt advised referral has been placed  

## 2013-07-16 ENCOUNTER — Ambulatory Visit (INDEPENDENT_AMBULATORY_CARE_PROVIDER_SITE_OTHER): Payer: Federal, State, Local not specified - PPO | Admitting: Cardiovascular Disease

## 2013-07-16 ENCOUNTER — Encounter: Payer: Self-pay | Admitting: Internal Medicine

## 2013-07-16 ENCOUNTER — Encounter: Payer: Self-pay | Admitting: Cardiovascular Disease

## 2013-07-16 ENCOUNTER — Ambulatory Visit (INDEPENDENT_AMBULATORY_CARE_PROVIDER_SITE_OTHER): Payer: Medicare Other | Admitting: Internal Medicine

## 2013-07-16 VITALS — BP 120/70 | HR 80 | Temp 98.3°F | Resp 16 | Wt 180.0 lb

## 2013-07-16 VITALS — BP 118/60 | HR 45 | Ht 73.5 in | Wt 179.7 lb

## 2013-07-16 DIAGNOSIS — E785 Hyperlipidemia, unspecified: Secondary | ICD-10-CM

## 2013-07-16 DIAGNOSIS — IMO0001 Reserved for inherently not codable concepts without codable children: Secondary | ICD-10-CM

## 2013-07-16 DIAGNOSIS — I6350 Cerebral infarction due to unspecified occlusion or stenosis of unspecified cerebral artery: Secondary | ICD-10-CM

## 2013-07-16 DIAGNOSIS — I639 Cerebral infarction, unspecified: Secondary | ICD-10-CM

## 2013-07-16 DIAGNOSIS — Z79899 Other long term (current) drug therapy: Secondary | ICD-10-CM

## 2013-07-16 DIAGNOSIS — D689 Coagulation defect, unspecified: Secondary | ICD-10-CM

## 2013-07-16 DIAGNOSIS — Z01818 Encounter for other preprocedural examination: Secondary | ICD-10-CM

## 2013-07-16 NOTE — Assessment & Plan Note (Signed)
Patient is a 67 year old thin-appearing married Caucasian male who had occipital stroke 07/02/13. He was seen in the ER and evaluated by Dr. Pearlean Brownie, stroke neurologist. MRI confirmed this an MRA did not show any obstructive lesions. Transthoracic echo was normal. The patient was placed on Plavix. His symptoms resolved. He was referred here for further evaluation, outpatient telemetry and transesophageal echocardiography.

## 2013-07-16 NOTE — Patient Instructions (Signed)
Dr Allyson Sabal has ordered a TEE (transesophageal echocardiogram)  Your physician has requested that you have a TEE. During a TEE, sound waves are used to create images of your heart. It provides your doctor with information about the size and shape of your heart and how well your heart's chambers and valves are working. In this test, a transducer is attached to the end of a flexible tube that's guided down your throat and into your esophagus (the tube leading from you mouth to your stomach) to get a more detailed image of your heart. You are not awake for the procedure. Please see the instruction sheet given to you today. For further information please visit [email or web address]   You will need to have blood work done prior to the procedure.  Our scheduler will tell you when to have that done.  Dr Allyson Sabal has also ordered a 30 day event monitor.  Dr Allyson Sabal will see you back when these tests are completed.

## 2013-07-16 NOTE — Progress Notes (Signed)
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 He is a very athletic and is an active runner. He runs 35 miles a week. He developed occipital headache and some loss of vision in his right eye he went to the ER where he was found to have an occipital stroke on MRI. He was evaluated by Dr. Pearlean Brownie, stroke neurologist, who ordered a 2-D echo that was normal. Telemetry in the hospital just showed sinus bradycardia. He is placed on by mouth Plavix. He was scheduled to have transcranial Dopplers at Dr. Marlis Edelson office as an outpatient and was referred here for further evaluation to the outpatient telemetry and trans-esophageal echocardiography. His cardiovascular risk factor profile is positive for hyperlipidemia and family history. Her father did die of a micro-infarction at age 70. He has never had a heart attack or stroke. He denies chest pain or shortness of breath.   Current Outpatient Prescriptions  Medication Sig Dispense Refill  . Cholecalciferol 2000 UNITS CAPS Take 1 capsule by mouth daily.        . clopidogrel (PLAVIX) 75 MG tablet Take 1 tablet (75 mg total) by mouth daily.  90 tablet  2  . ibuprofen (ADVIL,MOTRIN) 200 MG tablet Take 200 mg by mouth every 6 (six) hours as needed for pain (pain).      Richard Ellis levothyroxine (SYNTHROID, LEVOTHROID) 25 MCG tablet TAKE ONE TABLET BY MOUTH ONE TIME DAILY  30 tablet  5  . Magnesium 200 MG TABS Take 1 tablet by mouth.      . Mometasone Furo-Formoterol Fum (DULERA) 100-5 MCG/ACT AERO Inhale into the lungs 2 (two) times daily.        . Multiple Vitamin (MULTIVITAMIN WITH MINERALS) TABS Take 1 tablet by mouth every other day.      . niacin 500 MG tablet Take 250 mg by mouth at bedtime as needed.       Richard Ellis omega-3  acid ethyl esters (LOVAZA) 1 G capsule Take 2 capsules (2 g total) by mouth 2 (two) times daily.  360 capsule  3  . rosuvastatin (CRESTOR) 5 MG tablet Take 1 tablet (5 mg total) by mouth daily.  90 tablet  2   No current facility-administered medications for this visit.    Allergies  Allergen Reactions  . Atorvastatin Other (See Comments)    Muscle weakness  . Demerol [Meperidine]     sick    History   Social History  . Marital Status: Married    Spouse Name: N/A    Number of Children: N/A  . Years of Education: N/A   Occupational History  . Not on file.   Social History Main Topics  . Smoking status: Never Smoker   . Smokeless tobacco: Never Used  . Alcohol Use: 4.8 oz/week    4 Cans of beer, 4 Glasses of wine per week     Comment: 07/02/2013 "glass of wine or a beer most nights of the week"  . Drug Use: No  . Sexual Activity: Yes   Other Topics Concern  . Not on file   Social History Narrative  . No narrative on file     Review of Systems: General: negative for chills, fever, night sweats or weight changes.  Cardiovascular: negative  for chest pain, dyspnea on exertion, edema, orthopnea, palpitations, paroxysmal nocturnal dyspnea or shortness of breath Dermatological: negative for rash Respiratory: negative for cough or wheezing Urologic: negative for hematuria Abdominal: negative for nausea, vomiting, diarrhea, bright red blood per rectum, melena, or hematemesis Neurologic: negative for visual changes, syncope, or dizziness All other systems reviewed and are otherwise negative except as noted above.    Blood pressure 118/60, pulse 45, height 6' 1.5" (1.867 m), weight 179 lb 11.2 oz (81.511 kg).  General appearance: alert and no distress Neck: no adenopathy, no carotid bruit, no JVD, supple, symmetrical, trachea midline and thyroid not enlarged, symmetric, no tenderness/mass/nodules Lungs: clear to auscultation bilaterally Heart: regular rate and rhythm, S1,  S2 normal, no murmur, click, rub or gallop Abdomen: soft, non-tender; bowel sounds normal; no masses,  no organomegaly Extremities: extremities normal, atraumatic, no cyanosis or edema Pulses: 2+ and symmetric  EKG sinus bradycardia at 45 without ST or T wave changes. There were insignificant Q waves in the inferior leads.  ASSESSMENT AND PLAN:   Acute ischemic stroke Patient is a 67 year old thin-appearing married Caucasian male who had occipital stroke 07/02/13. He was seen in the ER and evaluated by Dr. Pearlean Brownie, stroke neurologist. MRI confirmed this an MRA did not show any obstructive lesions. Transthoracic echo was normal. The patient was placed on Plavix. His symptoms resolved. He was referred here for further evaluation, outpatient telemetry and transesophageal echocardiography.      Runell Gess MD FACP,FACC,FAHA, [facility] 07/16/2013 8:34 AM

## 2013-07-18 ENCOUNTER — Telehealth: Payer: Self-pay | Admitting: Cardiology

## 2013-07-18 NOTE — Telephone Encounter (Signed)
Pt called concerned that he was awakened by [employer] at 4 am. He was told they could not tell him way they were concerned, only that the MD on call had asked them to call and check on the pt. I spoke with [employer] and the pt- pt's HR went down into the low 30s around 4am. Apparently this is not abnormal for him. He is a runner (35 miles a week) and he says his HR is always slow. 30 not unusual for him. He is asymptomatic though he recently had an occipital stroke- etiology as yet undetermined. He saw Dr Allyson Sabal 10/30. He'll contact us if he has any syncope, near syncope, of unusual DOE.  Corine Shelter PA-C 07/18/2013 4:17 PM

## 2013-07-19 NOTE — Progress Notes (Signed)
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  Hospital Course:  1. CVA  - MRI of brain reported small right occipital lobe cortical infarct. Otherwise normal for age non contrast MRI appearance of the brain.  - MRA Otherwise negative intracranial MRA. Right PCA branches are within normal limits. Ectasia of the basilar artery tip without discrete basilar tip  aneurysm, corresponding to the hyperdense appearance of the basilar  artery on the earlier CT.  - Neurology on board and recommended:  Change to clopidogrel 75 mg orally every day for secondary stroke prevention. Recommend TEE as outpatient along with outpatient telemetry monitoring. Symptoms resolved; vision changes resolved: ok to drive. Patient has had symptoms with statins; however is willing to try low dose crestor 5mg  daily Please schedule an outpatient TCD bubble study with emboli monitoring with Dr. Pearlean Brownie in 1 month to further evaluate for possible PFO. Have patient call for appointment. These tests have been reviewed with patient and wife.  2. Hypothyroidism  - Stable continue home regimen  3. HPL  - Lovaza  - Start Statin, Crestor at low dose given elevated LDL of 177  Procedures:  MRI of brain  MRA of head  CT of head  TTE  Consultations:  Neurology     The patient presents for a follow-up of  chronic hypothyroidism, chronic dyslipidemia, elev glu controlled with medicines   BP Readings from Last 3 Encounters:  07/16/13 120/70  07/16/13 118/60  07/08/13 114/72   Wt  Readings from Last 3 Encounters:  07/16/13 180 lb (81.647 kg)  07/16/13 179 lb 11.2 oz (81.511 kg)  07/02/13 176 lb (79.833 kg)      Review of Systems  Constitutional: Negative for appetite change, fatigue and unexpected weight change.  HENT: Negative for congestion, nosebleeds, sneezing, sore throat and trouble swallowing.   Eyes: Negative for itching and visual disturbance.  Respiratory: Negative for cough.   Cardiovascular: Negative for chest pain, palpitations and leg swelling.  Gastrointestinal: Negative for nausea, diarrhea, blood in stool and abdominal distention.  Genitourinary: Negative for frequency and hematuria.  Musculoskeletal: Negative for back pain, gait problem, joint swelling and neck pain.  Skin: Negative for rash.  Neurological: Negative for dizziness, tremors, speech difficulty and weakness.  Psychiatric/Behavioral: Negative for sleep disturbance, dysphoric mood and agitation. The patient is not nervous/anxious.        Objective:   Physical Exam  Constitutional: He is oriented to person, place, and time. He appears well-developed.  HENT:  Mouth/Throat: Oropharynx is clear and moist.  Eyes: Conjunctivae are normal. Pupils are equal, round, and reactive to light.  Neck: Normal range of motion. No JVD present. No thyromegaly present.  Cardiovascular: Normal rate, regular rhythm, normal heart sounds and intact distal pulses.  Exam reveals no gallop and no friction rub.   No murmur heard. Pulmonary/Chest: Effort normal and breath sounds normal. No respiratory distress. He has no wheezes. He has no rales. He exhibits no tenderness.  Abdominal: Soft. Bowel sounds are normal. He exhibits no distension and no  mass. There is no tenderness. There is no rebound and no guarding.  Musculoskeletal: Normal range of motion. He exhibits no edema and no tenderness.  Lymphadenopathy:    He has no cervical adenopathy.  Neurological: He is alert and oriented to person, place, and  time. He has normal reflexes. No cranial nerve deficit. He exhibits normal muscle tone. Coordination normal.  Skin: Skin is warm and dry. No rash noted.  Psychiatric: He has a normal mood and affect. His behavior is normal. Judgment and thought content normal.     Lab Results  Component Value Date   WBC 5.4 07/02/2013   HGB 12.8* 07/02/2013   HCT 35.4* 07/02/2013   PLT 145* 07/02/2013   CHOL 253* 07/03/2013   TRIG 118 07/03/2013   HDL 52 07/03/2013   LDLDIRECT 168.2 08/06/2012   ALT 24 07/02/2013   AST 31 07/02/2013   NA 135 07/02/2013   K 3.8 07/02/2013   CL 101 07/02/2013   CREATININE 1.07 07/02/2013   BUN 24* 07/02/2013   CO2 27 07/02/2013   TSH 1.56 08/06/2012   PSA 5.40* 08/13/2012   INR 1.00 07/02/2013   HGBA1C 6.1* 07/03/2013          Assessment & Plan:

## 2013-07-19 NOTE — Assessment & Plan Note (Signed)
Statins were re-started - use w/caution

## 2013-07-19 NOTE — Assessment & Plan Note (Signed)
Occipital stroke 10/14; sx's resolved Avoid over-training, over-exertion; dehydration TEE pending, monitor is attached Continue with current prescription therapy as reflected on the Med list.

## 2013-07-19 NOTE — Assessment & Plan Note (Signed)
Statins w/caution

## 2013-07-20 ENCOUNTER — Telehealth: Payer: Self-pay | Admitting: Cardiovascular Disease

## 2013-07-20 NOTE — Telephone Encounter (Signed)
Received order for monitor and the diagnosis code is not allowed by insurance.  Is there another diagnosis code that can be used.  Please call.

## 2013-07-20 NOTE — Telephone Encounter (Signed)
This note printed and placed on Dr. Hazle Coca cart for review.

## 2013-07-20 NOTE — Telephone Encounter (Signed)
Diagnosis code was changed to 427.31, 427.89, and 434.91.  Form was faxed back to [employer].

## 2013-07-20 NOTE — Telephone Encounter (Signed)
Returned call and spoke w/ Engineer, site.  Informed Dx code used is not accepted by pt's insurance ([employer]).  Stated acceptable codes are: 426.0 thru 427.0, 427.31, 427.32, 427.61, 427.81, and 780.2.  Stated Dx code will need to be updated on Rx and faxed to [phone].   Message forwarded to Dr. Rockie Neighbours, RN.

## 2013-07-21 ENCOUNTER — Telehealth: Payer: Self-pay | Admitting: *Deleted

## 2013-07-21 NOTE — Telephone Encounter (Signed)
ToRoma Schanz Fax: [phone] From: [facility] Date/ Time: 07/18/2013 3:48 PM Taken By: Jethro BolusDurwin Nora Facility: home Patient: Richard Ellis, Richard Ellis DOB: 12/25/1945 Phone: [phone] Reason for Call: gave wrong info at visit 07/16/2013. Ran in race TWO weeks before stoke. Regarding Appointment: Appt Date: Appt Time: Unknown

## 2013-07-22 ENCOUNTER — Encounter (HOSPITAL_COMMUNITY): Payer: Self-pay | Admitting: Pharmacist

## 2013-07-23 ENCOUNTER — Other Ambulatory Visit: Payer: Self-pay

## 2013-07-27 ENCOUNTER — Telehealth: Payer: Self-pay | Admitting: Cardiology

## 2013-07-27 NOTE — Telephone Encounter (Signed)
Called with bradycardia of 31 lasting 30 seconds.  Patient was asleep and asymptomatic.  No new therapies ordered.  Results will be sent to Dr. Allyson Sabal.

## 2013-07-28 ENCOUNTER — Encounter (HOSPITAL_COMMUNITY)
Admission: RE | Disposition: A | Discharge status: Home / Self Care | Payer: Self-pay | Source: Ambulatory Visit | Attending: Internal Medicine

## 2013-07-28 ENCOUNTER — Ambulatory Visit (HOSPITAL_COMMUNITY)
Admission: RE | Admit: 2013-07-28 | Discharge: 2013-07-28 | Disposition: A | Discharge status: Home / Self Care | Payer: Federal, State, Local not specified - PPO | Source: Ambulatory Visit | Attending: Internal Medicine | Admitting: Internal Medicine

## 2013-07-28 ENCOUNTER — Encounter (HOSPITAL_COMMUNITY): Payer: Self-pay | Admitting: Gastroenterology

## 2013-07-28 DIAGNOSIS — I079 Rheumatic tricuspid valve disease, unspecified: Secondary | ICD-10-CM | POA: Insufficient documentation

## 2013-07-28 DIAGNOSIS — I059 Rheumatic mitral valve disease, unspecified: Secondary | ICD-10-CM | POA: Insufficient documentation

## 2013-07-28 DIAGNOSIS — I639 Cerebral infarction, unspecified: Secondary | ICD-10-CM

## 2013-07-28 DIAGNOSIS — Z01818 Encounter for other preprocedural examination: Secondary | ICD-10-CM

## 2013-07-28 DIAGNOSIS — Z8673 Personal history of transient ischemic attack (TIA), and cerebral infarction without residual deficits: Secondary | ICD-10-CM | POA: Insufficient documentation

## 2013-07-28 DIAGNOSIS — Z136 Encounter for screening for cardiovascular disorders: Secondary | ICD-10-CM | POA: Insufficient documentation

## 2013-07-28 DIAGNOSIS — I6350 Cerebral infarction due to unspecified occlusion or stenosis of unspecified cerebral artery: Secondary | ICD-10-CM

## 2013-07-28 HISTORY — PX: TEE WITHOUT CARDIOVERSION: SHX5443

## 2013-07-28 SURGERY — ECHOCARDIOGRAM, TRANSESOPHAGEAL
Anesthesia: Moderate Sedation

## 2013-07-28 MED ORDER — LIDOCAINE VISCOUS 2 % MT SOLN
OROMUCOSAL | Status: AC
  Filled 2013-07-28: qty 15

## 2013-07-28 MED ORDER — MIDAZOLAM HCL 5 MG/ML IJ SOLN
INTRAMUSCULAR | Status: AC
  Filled 2013-07-28: qty 2

## 2013-07-28 MED ORDER — FENTANYL CITRATE 0.05 MG/ML IJ SOLN
INTRAMUSCULAR | Status: AC
  Filled 2013-07-28: qty 2

## 2013-07-28 MED ORDER — FENTANYL CITRATE 0.05 MG/ML IJ SOLN
INTRAMUSCULAR | Status: DC | PRN
  Administered 2013-07-28 (×2): 25 ug via INTRAVENOUS

## 2013-07-28 MED ORDER — SODIUM CHLORIDE 0.9 % IV SOLN
INTRAVENOUS | Status: DC
  Administered 2013-07-28: 500 mL via INTRAVENOUS

## 2013-07-28 MED ORDER — LIDOCAINE VISCOUS 2 % MT SOLN
OROMUCOSAL | Status: DC | PRN
  Administered 2013-07-28: 10 mL via OROMUCOSAL

## 2013-07-28 MED ORDER — BUTAMBEN-TETRACAINE-BENZOCAINE 2-2-14 % EX AERO
INHALATION_SPRAY | CUTANEOUS | Status: DC | PRN
  Administered 2013-07-28: 1 via TOPICAL

## 2013-07-28 MED ORDER — MIDAZOLAM HCL 10 MG/2ML IJ SOLN
INTRAMUSCULAR | Status: DC | PRN
  Administered 2013-07-28 (×2): 2 mg via INTRAVENOUS
  Administered 2013-07-28 (×2): 1 mg via INTRAVENOUS

## 2013-07-28 NOTE — H&P (Signed)
     INTERVAL PROCEDURE H&P  History and Physical Interval Note:  07/28/2013 8:57 AM  Please refer to Dr. Hazle Coca recent office note for details regarding the procedure.  Richard Ellis has presented today for their planned procedure. The various methods of treatment have been discussed with the patient and family. After consideration of risks, benefits and other options for treatment, the patient has consented to the procedure.  The patients' outpatient history has been reviewed, patient examined, and no change in status from most recent office note within the past 30 days. I have reviewed the patients' chart and labs and will proceed as planned. Questions were answered to the patient's satisfaction.   Richard Nose, MD, [facility] Attending Cardiologist [facility]  Richard Ellis C 07/28/2013, 8:57 AM

## 2013-07-28 NOTE — Progress Notes (Signed)
Echocardiogram Echocardiogram Transesophageal has been performed.  Richard Ellis 07/28/2013, 11:33 AM

## 2013-07-28 NOTE — CV Procedure (Signed)
    TRANSESOPHAGEAL ECHOCARDIOGRAM (TEE) NOTE  INDICATIONS: occipital stroke  PROCEDURE:   Informed consent was obtained prior to the procedure. The risks, benefits and alternatives for the procedure were discussed and the patient comprehended these risks.  Risks include, but are not limited to, cough, sore throat, vomiting, nausea, somnolence, esophageal and stomach trauma or perforation, bleeding, low blood pressure, aspiration, pneumonia, infection, trauma to the teeth and death.    After a procedural time-out, the patient was given 6 mg versed and 50 mcg fentanyl for moderate sedation.  The oropharynx was anesthetized 10 cc of topical 1% viscous lidocaine and 1 spray of cetacaine.  The transesophageal probe was inserted in the esophagus and stomach without difficulty and multiple views were obtained.  The patient was kept under observation until the patient left the procedure room.  The patient left the procedure room in stable condition.   Agitated microbubble saline contrast was administered.  COMPLICATIONS:    There were no immediate complications.  Findings:  1. LEFT VENTRICLE: The left ventricular wall thickness is mildly enlarged with normal chamber sizes.  Small apical false tendon. The left ventricular cavity is normal in size. Wall motion is normal.  LVEF is 60%.  2. RIGHT VENTRICLE:  The right ventricle is normal in structure and function without any thrombus or masses.    3. LEFT ATRIUM:  The left atrium is normal in size without any thrombus or masses.  There is not spontaneous echo contrast ("smoke") in the left atrium consistent with a low flow state.  4. LEFT ATRIAL APPENDAGE:  The left atrial appendage is free of any thrombus or masses. The appendage has single lobes. Pulse doppler indicates high flow in the appendage.  5. ATRIAL SEPTUM:  The atrial septum demonstrates a small PFO by color doppler.  There is evidence for interatrial shunting by color doppler and  saline microbubble, with immediate passage of a small amount saline bubbles from right to left. The atrial septum is hypermobile.  6. RIGHT ATRIUM:  The right atrium is normal in size and function without any thrombus or masses.  7. MITRAL VALVE:  The mitral valve is normal in structure and function, with slightly redundant cords with trivial posterior regurgitation.  There were no vegetations or stenosis.  8. AORTIC VALVE:  The aortic valve is normal in structure and function with no regurgitation.  There were no vegetations or stenosis  9. TRICUSPID VALVE:  The tricuspid valve is normal in structure and function with trivial regurgitation.  There were no vegetations or stenosis  10.  PULMONIC VALVE:  The pulmonic valve is normal in structure and function with trivial regurgitation.  There were no vegetations or stenosis.   11. AORTIC ARCH, ASCENDING AND DESCENDING AORTA:  There was grade 1 Myrtis Ser et. Al, 1992) atherosclerosis of the proximal descending aorta.  IMPRESSION:   1. Small PFO with right to left flow noted after valsalva, mobile atrial septum. 2. LVEF 60%  RECOMMENDATIONS:    1. Continue current anti-platelet therapy with Plavix. 2. Follow-up with Dr. Allyson Sabal and Dr. Pearlean Brownie in neurology.  Time Spent Directly with the Patient:  45 minutes   Chrystie Nose, MD, [facility] Attending Cardiologist [facility]  07/28/2013, 10:04 AM

## 2013-07-29 ENCOUNTER — Encounter (HOSPITAL_COMMUNITY): Payer: Self-pay | Admitting: Internal Medicine

## 2013-07-31 ENCOUNTER — Telehealth: Payer: Self-pay | Admitting: Cardiovascular Disease

## 2013-07-31 NOTE — Telephone Encounter (Signed)
Previous OV note and telephone documentation reviewed and patient has h/o bradycardia (is runner & athletic). Previous bradycardic events have been documented by [employer] and phoned in to our office.

## 2013-07-31 NOTE — Telephone Encounter (Signed)
Spoke with Deanna at [facility]. Patient had EKG recording around 0500 - severe bradycardia with HR 33bpm & asymptomatic.

## 2013-08-06 ENCOUNTER — Ambulatory Visit: Payer: Federal, State, Local not specified - PPO | Admitting: Internal Medicine

## 2013-09-15 ENCOUNTER — Encounter: Payer: Self-pay | Admitting: Cardiovascular Disease

## 2013-09-15 ENCOUNTER — Ambulatory Visit (INDEPENDENT_AMBULATORY_CARE_PROVIDER_SITE_OTHER): Payer: Federal, State, Local not specified - PPO | Admitting: Cardiovascular Disease

## 2013-09-15 VITALS — BP 122/60 | HR 60 | Ht 73.5 in | Wt 181.0 lb

## 2013-09-15 DIAGNOSIS — I6350 Cerebral infarction due to unspecified occlusion or stenosis of unspecified cerebral artery: Secondary | ICD-10-CM

## 2013-09-15 DIAGNOSIS — Q211 Atrial septal defect: Secondary | ICD-10-CM

## 2013-09-15 DIAGNOSIS — I639 Cerebral infarction, unspecified: Secondary | ICD-10-CM

## 2013-09-15 DIAGNOSIS — Q2112 Patent foramen ovale: Secondary | ICD-10-CM

## 2013-09-15 DIAGNOSIS — Q2111 Secundum atrial septal defect: Secondary | ICD-10-CM

## 2013-09-15 NOTE — Patient Instructions (Addendum)
Dr Allyson Sabal has referred you to Dr Excell Seltzer at the [facility] office for evaluation of the PFO.  Follow up with Dr Allyson Sabal as needed.

## 2013-09-15 NOTE — Progress Notes (Signed)
Richard Ellis had a transesophageal echo performed 07/28/13 by Dr. Rennis Golden which a small PFO with right to left shunting. This may be the etiology of his "cryptogenic stroke". I am referring him to Tonny Bollman for consideration of PFO closure. If Dr. Excell Seltzer elect to close his PFO he can follow him prospectively. If not I will see him back when necessary.  Runell Gess, M.D., FACP, [facility], [facility] [address] [place], [place]  [address]  [phone] 09/15/2013 5:08 PM

## 2013-09-15 NOTE — Assessment & Plan Note (Signed)
Richard Ellis had a transesophageal echocardiogram performed by Dr. Italy Hilty 07/28/13 revealing normal LV systolic function with a small PFO and right to left shunting with Valsalva.his neurologic deficit cleared. He is on Plavix. I am referring him to Dr. Tonny Bollman for evaluation and consideration of PFO closure to prevent future CNS embolic events.

## 2013-09-21 ENCOUNTER — Telehealth: Payer: Self-pay | Admitting: Cardiovascular Disease

## 2013-09-21 NOTE — Telephone Encounter (Signed)
Spoke with Richard Ellis in reference to his appointment to see Dr. Burt Knack on [facility].  The appointment is scheduled for Friday 10/16/13 @ 2:30 pm.  Patient is to arrive @ 2:15 for check in.  I also gave the patient Dr. Antionette Char phone number in case he needs to reschedule.  Patient voiced understanding.

## 2013-10-05 ENCOUNTER — Other Ambulatory Visit: Payer: Self-pay | Admitting: Internal Medicine

## 2013-10-06 ENCOUNTER — Telehealth: Payer: Self-pay | Admitting: Internal Medicine

## 2013-10-06 NOTE — Telephone Encounter (Signed)
10/06/2013  Message was left in regards to pt requesting a refill onVIAGRA 100 MG

## 2013-10-07 NOTE — Telephone Encounter (Signed)
Rf sent 10/05/13. Pt informed

## 2013-10-10 ENCOUNTER — Other Ambulatory Visit: Payer: Self-pay | Admitting: Internal Medicine

## 2013-10-16 ENCOUNTER — Ambulatory Visit (INDEPENDENT_AMBULATORY_CARE_PROVIDER_SITE_OTHER): Payer: Federal, State, Local not specified - PPO | Admitting: Cardiovascular Disease

## 2013-10-16 ENCOUNTER — Encounter: Payer: Self-pay | Admitting: Cardiovascular Disease

## 2013-10-16 VITALS — BP 114/64 | HR 48 | Ht 73.0 in | Wt 180.4 lb

## 2013-10-16 DIAGNOSIS — Q2112 Patent foramen ovale: Secondary | ICD-10-CM

## 2013-10-16 DIAGNOSIS — Q211 Atrial septal defect: Secondary | ICD-10-CM

## 2013-10-16 DIAGNOSIS — Q2111 Secundum atrial septal defect: Secondary | ICD-10-CM

## 2013-10-16 NOTE — Progress Notes (Signed)
HPI:  68 year old gentleman referred for evaluation of PFO with history of stroke. In October 2014 developed a severe posterior headache and central vision loss. He had no previous history of headache. An MRI of the brain showed a right occipital cortical infarct. He underwent routine evaluation in the hospital with telemetry, carotid Dopplers, and the surface echocardiogram. All of these showed essentially normal findings. He wore an event monitor that showed no evidence of atrial fibrillation. A TEE was done as an outpatient and this demonstrated a small PFO with spontaneous right to left flow increasing with Valsalva. LV function was normal and there were no valvular abnormalities. Lipids drawn during his hospitalization showed cholesterol 253, triglycerides 118, HDL 52, and LDL 177. The patient had a long flight from the [facility] about one week before his stroke. No history of previous stroke or TIA.   He is a competitive runner and has no exertional symptoms. He does admit to some dyspnea when he first starts running but this resolves with continued exercise. Reports no residual deficit from his stroke. Tolerating plavix without problems. He has no documented history of hypoxemia. No other complaints.  Outpatient Encounter Prescriptions as of 10/16/2013  Medication Sig  . Cholecalciferol 2000 UNITS CAPS Take 1 capsule by mouth daily.    . clopidogrel (PLAVIX) 75 MG tablet Take 75 mg by mouth daily.  . Coenzyme Q-10 200 MG CAPS Take 200 mg by mouth daily.  . Echinacea 400 MG CAPS Take 800 mg by mouth 2 (two) times daily.  [name] ibuprofen (ADVIL,MOTRIN) 200 MG tablet Take 400 mg by mouth every 6 (six) hours as needed (for pain).   [name] levothyroxine (SYNTHROID, LEVOTHROID) 25 MCG tablet TAKE ONE TABLET BY MOUTH ONE TIME DAILY   . Magnesium 250 MG TABS Take 250 mg by mouth daily.  [name] omega-3 acid ethyl esters (LOVAZA) 1 G capsule Take 2 capsules (2 g total) by mouth 2 (two) times daily.  . rosuvastatin  (CRESTOR) 5 MG tablet Take 1 tablet (5 mg total) by mouth daily.  . sildenafil (VIAGRA) 100 MG tablet Take 1/4-1/2  tablet by mouth as needed  . [DISCONTINUED] VIAGRA 100 MG tablet Take one tablet by mouth as needed    Atorvastatin; Demerol; and Niacin and related  Past Medical History  Diagnosis Date  . Hyperlipidemia   . Allergy     rhinitis  . Bronchitis   . Hypothyroidism   . Heart murmur     "as a child" (07/02/2013)  . Chronic bronchitis     "q year" (07/02/2013)  . Anemia     "Hgb always on the low side" (07/02/2013)  . Esophageal stricture   . Stroke 07/02/2013    "small ischemic occipital right sided" (07/02/2013)  . Osteoarthritis     "maybe a little bit in my spine" (07/02/2013)  . PFO (patent foramen ovale)     with right to left shunting    Past Surgical History  Procedure Laterality Date  . Esophagogastroduodenoscopy N/A 10/26/2012    Procedure: ESOPHAGOGASTRODUODENOSCOPY (EGD);  Surgeon: Winfield Cunas., MD;  Location: [facility] ENDOSCOPY;  Service: Endoscopy;  Laterality: N/A;  . Tonsillectomy  1950's  . Esophagogastroduodenoscopy N/A 07/08/2013    Procedure: ESOPHAGOGASTRODUODENOSCOPY (EGD);  Surgeon: Winfield Cunas., MD;  Location: [facility] ENDOSCOPY;  Service: Endoscopy;  Laterality: N/A;  . Savory dilation N/A 07/08/2013    Procedure: SAVORY DILATION;  Surgeon: Winfield Cunas., MD;  Location: [facility] ENDOSCOPY;  Service: Endoscopy;  Laterality: N/A;  . Tee without cardioversion N/A 07/28/2013    Procedure: TRANSESOPHAGEAL ECHOCARDIOGRAM (TEE);  Surgeon: Pixie Casino, MD;  Location: [facility] ENDOSCOPY;  Service: Cardiovascular;  Laterality: N/A;    History   Social History  . Marital Status: Married    Spouse Name: N/A    Number of Children: N/A  . Years of Education: N/A   Occupational History  . Not on file.   Social History Main Topics  . Smoking status: Never Smoker   . Smokeless tobacco: Never Used  . Alcohol Use: 4.8 oz/week    4 Cans of beer, 4  Glasses of wine per week     Comment: 07/02/2013 "glass of wine or a beer most nights of the week"  . Drug Use: No  . Sexual Activity: Yes   Other Topics Concern  . Not on file   Social History Narrative  . No narrative on file    Family History  Problem Relation Age of Onset  . Coronary artery disease Other     male first degree relative <60  . Heart disease Father     heart attack  . Colon cancer Neg Hx     ROS:  General: no fevers/chills/night sweats Eyes: no blurry vision, diplopia, or amaurosis ENT: no sore throat or hearing loss Resp: no cough, wheezing, or hemoptysis CV: no edema or palpitations GI: no abdominal pain, nausea, vomiting, diarrhea, or constipation GU: no dysuria, frequency, or hematuria Skin: no rash Neuro: no headache, numbness, tingling, or weakness of extremities Musculoskeletal: no joint pain or swelling Heme: no bleeding, DVT, or easy bruising Endo: no polydipsia or polyuria  BP 114/64  Pulse 48  Ht 6\' 1"  (1.854 m)  Wt 180 lb 6.4 oz (81.829 kg)  BMI 23.81 kg/m2  PHYSICAL EXAM: Pt is alert and oriented, WD, WN, physically-fit appearing male in no distress. HEENT: normal Neck: JVP normal. Carotid upstrokes normal without bruits. No thyromegaly. Lungs: equal expansion, clear bilaterally CV: Apex is discrete and nondisplaced, RRR without murmur or gallop Abd: soft, NT, +BS, no bruit, no hepatosplenomegaly Back: no CVA tenderness Ext: no C/C/E        DP/PT pulses intact and = Skin: warm and dry without rash Neuro: CNII-XII intact             Strength intact = bilaterally  TEE 07/28/2013: Study Conclusions  - Left ventricle: There was mild concentric hypertrophy. Systolic function was normal. The estimated ejection fraction was in the range of 60% to 65%. - Aortic valve: Structurally normal valve. - Mitral valve: Structurally normal valve. - Left atrium: The atrium was normal in size. No evidence of thrombus in the atrial cavity or  appendage. - Right atrium: No evidence of thrombus in the atrial cavity or appendage. - Atrial septum: Small PFO with right to left shunting by color doppler. Saline microbubbles are seen shunting from right to left, especially with valsalva. - Tricuspid valve: Trivial regurgitation. - Pericardium, extracardiac: There was no pericardial Effusion.  ASSESSMENT AND PLAN: 68 year-old with cryptogenic stroke in setting of PFO. The patient is with his wife today. We discussed issues around the complex relationship between PFO and stroke. The patient understands that PFO is common in the general population, but even more common in cryptogenic stroke patients. His only other risk factor for stroke is hyperlipidemia, which is now treated with a statin drug. At the time of his stroke, he was not on routine antiplatelet Rx (taking ASA twice/week). We  reviewed the data from RCT's on PFO closure versus medical therapy. After review of options, we elected to continue his current treatment strategy of antiplatelet therapy rather than device closure of his PFO. Certainly, if he has another event on a background of antiplatelet therapy, transcatheter PFO closure should be strongly considered. We reviewed the importance of frequent ambulation if any prolonged travel. He will follow-up in 6 months.  Time spent in this consultation over 45 minutes, greater than 50% of which was in face-to-face discussion of treatment options for PFO and stroke.   [name] 10/20/2013 11:34 PM

## 2013-10-16 NOTE — Patient Instructions (Signed)
Your physician wants you to follow-up in: 6 months with Dr. Cooper.  You will receive a reminder letter in the mail two months in advance. If you don't receive a letter, please call our office to schedule the follow-up appointment.  Your physician recommends that you continue on your current medications as directed. Please refer to the Current Medication list given to you today.  

## 2013-11-05 ENCOUNTER — Other Ambulatory Visit: Payer: Self-pay | Admitting: *Deleted

## 2013-11-05 MED ORDER — CLOPIDOGREL BISULFATE 75 MG PO TABS
75.0000 mg | ORAL_TABLET | Freq: Every day | ORAL | Status: DC
Start: 2013-11-05 — End: 2014-04-07

## 2013-11-06 ENCOUNTER — Telehealth: Payer: Self-pay | Admitting: *Deleted

## 2013-11-06 NOTE — Telephone Encounter (Signed)
Patient phoned requesting refill on Plavix-per MAR review, pharmacy confirmed receipt yesterday at 1615. Phoned and notified spouse.

## 2013-12-14 ENCOUNTER — Telehealth: Payer: Self-pay

## 2013-12-14 NOTE — Telephone Encounter (Signed)
Phone call from patient wanting to give and FYI that he stopped statin he was on due to muscle pain and he will just discuss it with you at his appt in May.

## 2013-12-15 NOTE — Telephone Encounter (Signed)
Noted. Thx.

## 2014-02-02 ENCOUNTER — Encounter: Payer: Self-pay | Admitting: Internal Medicine

## 2014-02-02 ENCOUNTER — Other Ambulatory Visit (INDEPENDENT_AMBULATORY_CARE_PROVIDER_SITE_OTHER): Payer: Federal, State, Local not specified - PPO

## 2014-02-02 ENCOUNTER — Ambulatory Visit (INDEPENDENT_AMBULATORY_CARE_PROVIDER_SITE_OTHER): Payer: Federal, State, Local not specified - PPO | Admitting: Internal Medicine

## 2014-02-02 VITALS — BP 101/78 | HR 52 | Temp 98.5°F | Resp 16 | Ht 73.5 in | Wt 178.0 lb

## 2014-02-02 DIAGNOSIS — I639 Cerebral infarction, unspecified: Secondary | ICD-10-CM

## 2014-02-02 DIAGNOSIS — E039 Hypothyroidism, unspecified: Secondary | ICD-10-CM

## 2014-02-02 DIAGNOSIS — Z0000 Encounter for general adult medical examination without abnormal findings: Secondary | ICD-10-CM

## 2014-02-02 DIAGNOSIS — R7309 Other abnormal glucose: Secondary | ICD-10-CM

## 2014-02-02 DIAGNOSIS — N320 Bladder-neck obstruction: Secondary | ICD-10-CM

## 2014-02-02 DIAGNOSIS — R739 Hyperglycemia, unspecified: Secondary | ICD-10-CM

## 2014-02-02 DIAGNOSIS — E785 Hyperlipidemia, unspecified: Secondary | ICD-10-CM

## 2014-02-02 DIAGNOSIS — D649 Anemia, unspecified: Secondary | ICD-10-CM

## 2014-02-02 DIAGNOSIS — I6350 Cerebral infarction due to unspecified occlusion or stenosis of unspecified cerebral artery: Secondary | ICD-10-CM

## 2014-02-02 DIAGNOSIS — R799 Abnormal finding of blood chemistry, unspecified: Secondary | ICD-10-CM

## 2014-02-02 LAB — CBC WITH DIFFERENTIAL/PLATELET
Basophils Absolute: 0 10*3/uL (ref 0.0–0.1)
Basophils Relative: 0.6 % (ref 0.0–3.0)
Eosinophils Absolute: 0.1 10*3/uL (ref 0.0–0.7)
Eosinophils Relative: 3.4 % (ref 0.0–5.0)
HCT: 37.9 % — ABNORMAL LOW (ref 39.0–52.0)
Hemoglobin: 12.9 g/dL — ABNORMAL LOW (ref 13.0–17.0)
Lymphocytes Relative: 31.3 % (ref 12.0–46.0)
Lymphs Abs: 1.3 10*3/uL (ref 0.7–4.0)
MCHC: 34.1 g/dL (ref 30.0–36.0)
MCV: 94.9 fl (ref 78.0–100.0)
Monocytes Absolute: 0.4 10*3/uL (ref 0.1–1.0)
Monocytes Relative: 10.8 % (ref 3.0–12.0)
Neutro Abs: 2.2 10*3/uL (ref 1.4–7.7)
Neutrophils Relative %: 53.9 % (ref 43.0–77.0)
Platelets: 157 10*3/uL (ref 150.0–400.0)
RBC: 3.99 Mil/uL — ABNORMAL LOW (ref 4.22–5.81)
RDW: 13.1 % (ref 11.5–15.5)
WBC: 4.1 10*3/uL (ref 4.0–10.5)

## 2014-02-02 LAB — BASIC METABOLIC PANEL
BUN: 22 mg/dL (ref 6–23)
CO2: 26 mEq/L (ref 19–32)
Calcium: 9.2 mg/dL (ref 8.4–10.5)
Chloride: 106 mEq/L (ref 96–112)
Creatinine, Ser: 1.1 mg/dL (ref 0.4–1.5)
GFR: 70.79 mL/min (ref >60.00)
Glucose, Bld: 91 mg/dL (ref 70–99)
Potassium: 3.9 mEq/L (ref 3.5–5.1)
Sodium: 138 mEq/L (ref 135–145)

## 2014-02-02 LAB — IBC PANEL
Iron: 130 ug/dL (ref 42–165)
Saturation Ratios: 39.5 % (ref 20.0–50.0)
Transferrin: 234.9 mg/dL (ref 212.0–360.0)

## 2014-02-02 LAB — LIPID PANEL
Cholesterol: 269 mg/dL — ABNORMAL HIGH (ref 0–200)
HDL: 57.9 mg/dL (ref >39.00)
LDL Cholesterol: 188 mg/dL — ABNORMAL HIGH (ref 0–99)
Total CHOL/HDL Ratio: 5
Triglycerides: 116 mg/dL (ref 0.0–149.0)
VLDL: 23.2 mg/dL (ref 0.0–40.0)

## 2014-02-02 LAB — HEMOGLOBIN A1C: Hgb A1c MFr Bld: 5.8 % (ref 4.6–6.5)

## 2014-02-02 LAB — HEPATIC FUNCTION PANEL
ALT: 26 U/L (ref 0–53)
AST: 29 U/L (ref 0–37)
Albumin: 3.9 g/dL (ref 3.5–5.2)
Alkaline Phosphatase: 44 U/L (ref 39–117)
Bilirubin, Direct: 0.1 mg/dL (ref 0.0–0.3)
Total Bilirubin: 0.7 mg/dL (ref 0.2–1.2)
Total Protein: 6.4 g/dL (ref 6.0–8.3)

## 2014-02-02 LAB — TSH: TSH: 2.91 u[IU]/mL (ref 0.35–4.50)

## 2014-02-02 LAB — PSA: PSA: 3.82 ng/mL (ref 0.10–4.00)

## 2014-02-02 NOTE — Progress Notes (Signed)
Pre visit review using our clinic review tool, if applicable. No additional management support is needed unless otherwise documented below in the visit note. 

## 2014-02-02 NOTE — Assessment & Plan Note (Signed)
On Plavix 

## 2014-02-02 NOTE — Progress Notes (Signed)
   Subjective:    HPI  The patient is here for a wellness exam. The patient has been doing well overall without major physical or psychological issues going on lately. He stopped his statin due to myalgias   Richard Ellis is exercising a lot: running, weights The patient presents for a follow-up of  chronic hypothyroidism, chronic dyslipidemia, elev glucose controlled with medicines and diet.    BP Readings from Last 3 Encounters:  02/02/14 101/78  10/16/13 114/64  09/15/13 122/60   Wt Readings from Last 3 Encounters:  02/02/14 178 lb (80.74 kg)  10/16/13 180 lb 6.4 oz (81.829 kg)  09/15/13 181 lb (82.101 kg)     Review of Systems  Constitutional: Negative for appetite change, fatigue and unexpected weight change.  HENT: Negative for congestion, nosebleeds, sneezing, sore throat and trouble swallowing.   Eyes: Negative for itching and visual disturbance.  Respiratory: Negative for cough.   Cardiovascular: Negative for chest pain, palpitations and leg swelling.  Gastrointestinal: Negative for nausea, diarrhea, blood in stool and abdominal distention.  Genitourinary: Negative for frequency and hematuria.  Musculoskeletal: Negative for arthralgias, back pain, gait problem, joint swelling, myalgias and neck pain.  Skin: Negative for color change, rash and wound.  Neurological: Negative for dizziness, tremors, speech difficulty and weakness.  Psychiatric/Behavioral: Negative for suicidal ideas, sleep disturbance, dysphoric mood and agitation. The patient is not nervous/anxious.        Objective:   Physical Exam  Constitutional: He is oriented to person, place, and time. He appears well-developed.  HENT:  Mouth/Throat: Oropharynx is clear and moist.  Eyes: Conjunctivae are normal. Pupils are equal, round, and reactive to light.  Neck: Normal range of motion. No JVD present. No thyromegaly present.  Cardiovascular: Normal rate, regular rhythm, normal heart sounds and intact distal  pulses.  Exam reveals no gallop and no friction rub.   No murmur heard. Pulmonary/Chest: Effort normal and breath sounds normal. No respiratory distress. He has no wheezes. He has no rales. He exhibits no tenderness.  Abdominal: Soft. Bowel sounds are normal. He exhibits no distension and no mass. There is no tenderness. There is no rebound and no guarding.  Genitourinary: Rectum normal. Guaiac negative stool. No penile tenderness.  Prostate 1+  Musculoskeletal: Normal range of motion. He exhibits no edema and no tenderness.  Lymphadenopathy:    He has no cervical adenopathy.  Neurological: He is alert and oriented to person, place, and time. He has normal reflexes. No cranial nerve deficit. He exhibits normal muscle tone. Coordination normal.  Skin: Skin is warm and dry. No rash noted.  Psychiatric: He has a normal mood and affect. His behavior is normal. Judgment and thought content normal.       Lab Results  Component Value Date   WBC 5.4 07/02/2013   HGB 12.8* 07/02/2013   HCT 35.4* 07/02/2013   PLT 145* 07/02/2013   GLUCOSE 99 07/02/2013   CHOL 253* 07/03/2013   TRIG 118 07/03/2013   HDL 52 07/03/2013   LDLDIRECT 168.2 08/06/2012   LDLCALC 177* 07/03/2013   ALT 24 07/02/2013   AST 31 07/02/2013   NA 135 07/02/2013   K 3.8 07/02/2013   CL 101 07/02/2013   CREATININE 1.07 07/02/2013   BUN 24* 07/02/2013   CO2 27 07/02/2013   TSH 1.56 08/06/2012   PSA 5.40* 08/13/2012   INR 1.00 07/02/2013   HGBA1C 6.1* 07/03/2013     Assessment & Plan:

## 2014-02-02 NOTE — Assessment & Plan Note (Addendum)
   Here for medicare wellness/physical  Diet: heart healthy  Physical activity: not sedentary - very active Depression/mood screen: negative  Hearing: intact to whispered voice  Visual acuity: grossly normal, performs annual eye exam  ADLs: capable  Fall risk: none  Home safety: good  Cognitive evaluation: intact to orientation, naming, recall and repetition  EOL planning: adv directives, full code/ I agree  I have personally reviewed and have noted  1. The patient's medical and social history  2. Their use of alcohol, tobacco or illicit drugs  3. Their current medications and supplements  4. The patient's functional ability including ADL's, fall risks, home safety risks and hearing or visual impairment.  5. Diet and physical activities  6. Evidence for depression or mood disorders    Today patient counseled on age appropriate routine health concerns for screening and prevention, each reviewed and up to date or declined. Immunizations reviewed and up to date or declined. Labs ordered and reviewed. Risk factors for depression reviewed and negative. Hearing function and visual acuity are intact. ADLs screened and addressed as needed. Functional ability and level of safety reviewed and appropriate. Education, counseling and referrals performed based on assessed risks today. Patient provided with a copy of personalized plan for preventive services.   He thinks he had Zostavax Declined Prevnar

## 2014-02-02 NOTE — Assessment & Plan Note (Signed)
Stopped [facility]

## 2014-02-02 NOTE — Assessment & Plan Note (Signed)
Continue with current prescription therapy as reflected on the Med list.  

## 2014-02-02 NOTE — Assessment & Plan Note (Signed)
Labs

## 2014-02-17 ENCOUNTER — Encounter: Payer: Self-pay | Admitting: Sports Medicine

## 2014-02-17 ENCOUNTER — Ambulatory Visit (INDEPENDENT_AMBULATORY_CARE_PROVIDER_SITE_OTHER): Payer: Medicare Other | Admitting: Sports Medicine

## 2014-02-17 VITALS — BP 134/71 | Ht 73.5 in | Wt 173.0 lb

## 2014-02-17 DIAGNOSIS — S92302A Fracture of unspecified metatarsal bone(s), left foot, initial encounter for closed fracture: Secondary | ICD-10-CM

## 2014-02-17 DIAGNOSIS — I6350 Cerebral infarction due to unspecified occlusion or stenosis of unspecified cerebral artery: Secondary | ICD-10-CM

## 2014-02-17 DIAGNOSIS — M25579 Pain in unspecified ankle and joints of unspecified foot: Secondary | ICD-10-CM

## 2014-02-17 DIAGNOSIS — S92309A Fracture of unspecified metatarsal bone(s), unspecified foot, initial encounter for closed fracture: Secondary | ICD-10-CM

## 2014-02-17 NOTE — Progress Notes (Signed)
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 He states that he first noticed this about a month ago. He was at a track workout and was running at 200 m sprint and felt and heard a pop in his left forefoot along with pain. He was wearing tract spikes at that time. He was able to finish out his workout that day. The next day he noticed some mild swelling over the left forefoot as well as some mild bruising. The pain caused him to live for a few days. He was able to return to running successfully. However 2 weeks later he reinjured the foot. He was doing 800 m interval and the pain returned causing him to limp again. He ran a race than this Saturday that was a 5 mile race and had pain throughout. He does feel like his pain has been gradually improving but he is a little bit anxious because he has raises and meets coming up. Pain is largely at the ball of his left foot and he is sore at the top and bottom.  ROS: As above in the HPI. All other systems are stable or negative.  OBJECTIVE: APPEARANCE:  Patient in no acute distress.The patient appeared well nourished and normally developed. HEENT: No scleral icterus. Conjunctiva non-injected Resp: Non labored Skin: No rash MSK:  Left foot: - No swelling or deformity. - Varus migration of the lateral column, right greater than left - He has tenderness to palpation greatest over the dorsal aspect of the second MTP joint. No significant tenderness along the metatarsal shaft or other metatarsals. - Significant pain with flexion and extension of the second toe - With running he does have mild pronation of the right foot   MSK [place]: Limited ultrasound left foot was performed today. The second metatarsal and second MTP joint were examined. Second MTP joint is noted to have a hyperechoic irregular signal off of the dorsal aspect of the metatarsal head consistent with an  avulsion injury from the extensor tendon. There is hypoechoic edema in this area. Metatarsal shaft is normal in appearance with no cortical irregularity or overlying hypoechoic change.  ASSESSMENT: #1. Left foot pain secondary to small avulsion fracture at the second MTP joint   PLAN: We offered reassurance that we do think it is safe for him to continue running as tolerated through this injury. We will have him buddy tape his 2 toes. We did make him in a sport insoles with fifth ray post bilaterally to help minimize forefoot supination. A scaphoid pad was also added to the right foot for pronation. A neuroma pad was placed on the left insole behind the second MTP joint to help support this joint as it heals. I encouraged him to ice the foot especially after running. He was also encouraged to try to avoid track spikes.

## 2014-02-22 ENCOUNTER — Ambulatory Visit: Payer: Federal, State, Local not specified - PPO | Admitting: Family Medicine

## 2014-02-25 ENCOUNTER — Other Ambulatory Visit (INDEPENDENT_AMBULATORY_CARE_PROVIDER_SITE_OTHER): Payer: Federal, State, Local not specified - PPO

## 2014-02-25 ENCOUNTER — Ambulatory Visit (INDEPENDENT_AMBULATORY_CARE_PROVIDER_SITE_OTHER): Payer: Federal, State, Local not specified - PPO | Admitting: Family Medicine

## 2014-02-25 ENCOUNTER — Encounter: Payer: Self-pay | Admitting: Family Medicine

## 2014-02-25 VITALS — BP 120/80 | HR 39 | Ht 73.5 in | Wt 178.0 lb

## 2014-02-25 DIAGNOSIS — I6350 Cerebral infarction due to unspecified occlusion or stenosis of unspecified cerebral artery: Secondary | ICD-10-CM

## 2014-02-25 DIAGNOSIS — M25552 Pain in left hip: Secondary | ICD-10-CM

## 2014-02-25 DIAGNOSIS — M25559 Pain in unspecified hip: Secondary | ICD-10-CM

## 2014-02-25 DIAGNOSIS — IMO0002 Reserved for concepts with insufficient information to code with codable children: Secondary | ICD-10-CM

## 2014-02-25 DIAGNOSIS — S76019A Strain of muscle, fascia and tendon of unspecified hip, initial encounter: Secondary | ICD-10-CM | POA: Insufficient documentation

## 2014-02-25 MED ORDER — DICLOFENAC SODIUM 2 % TD SOLN: 2.0000 "application " | Freq: Two times a day (BID) | TRANSDERMAL | Status: DC

## 2014-02-25 NOTE — Progress Notes (Signed)
  [name] [address] [place], [place] [address] Phone: [phone] Subjective:       CC: Hip pain left  [id number] Richard Ellis is a 68 y.o. male coming in with complaint of hip pain.  Patient states that this is left-sided. Patient states his most of the posterior lateral aspect of the hip. Patient is an avid runner averaging 30-40 miles per week. Patient states it is more just for discomfort especially if he stays in the position for too long. Patient states denies that any radiation happens down the leg. The pain is not severe enough to stop him from any activity. Patient does like to do a lot of races and states that he is having some mild discomfort as well. Denies any weakness of the hip. Denies any nighttime awakening. Patient was is a 30 5/10 because it is starting to affect him when he drives and some other daily activities.     Past medical history, social, surgical and family history all reviewed in electronic medical record.   Review of Systems: No headache, visual changes, nausea, vomiting, diarrhea, constipation, dizziness, abdominal pain, skin rash, fevers, chills, night sweats, weight loss, swollen lymph nodes, body aches, joint swelling, muscle aches, chest pain, shortness of breath, mood changes.   Objective Blood pressure 120/80, pulse 39, height 6' 1.5" (1.867 m), weight 178 lb (80.74 kg), SpO2 97.00%.  General: No apparent distress alert and oriented x3 mood and affect normal, dressed appropriately.  HEENT: Pupils equal, extraocular movements intact  Respiratory: Patient's speak in full sentences and does not appear short of breath  Cardiovascular: No lower extremity edema, non tender, no erythema  Skin: Warm dry intact with no signs of infection or rash on extremities or on axial skeleton.  Abdomen: Soft nontender  Neuro: Cranial nerves II through XII are intact, neurovascularly intact in all extremities with 2+ DTRs and 2+  pulses.  Lymph: No lymphadenopathy of posterior or anterior cervical chain or axillae bilaterally.  Gait normal with good balance and coordination.  MSK:  Non tender with full range of motion and good stability and symmetric strength and tone of shoulders, elbows, wrist,  knee and ankles bilaterally.  Hip: Left ROM IR: 35 Deg, ER: 45 Deg, Flexion: 120 Deg, Extension: 100 Deg, Abduction: 45 Deg, Adduction: 45 Deg Strength IR: 5/5, ER: 5/5, Flexion: 5/5, Extension: 5/5, Abduction: 5/5, Adduction: 5/5 Pelvic alignment unremarkable to inspection and palpation. Standing hip rotation and gait without trendelenburg sign / unsteadiness. Greater trochanter without tenderness to palpation. Patient though is somewhat tender to palpation at the insertion of the gluteus tendons on the posterior aspect of the greater trochanteric area. No tenderness over piriformis and greater trochanter. No pain with FABER or FADIR. No SI joint tenderness and normal minimal SI movement.  MSK US performed of: Left hip This study was ordered, performed, and interpreted by Charlann Boxer D.O.  Hip: Trochanteric bursa without swelling or effusion. Patient though at the insertion of the gluteus minimus tendon does have what appears to be calcific changes and some mild hypoechoic changes with increasing Doppler flow. No true tear appreciated. Acetabular labrum visualized and without tears, displacement, or effusion in joint. Femoral neck appears unremarkable without increased power doppler signal along Cortex.  IMPRESSION:  Calcific gluteus minimus tendinopathy.     Impression and Recommendations:     This case required medical decision making of moderate complexity.

## 2014-02-25 NOTE — Patient Instructions (Addendum)
Good to see you Tennis ball to back left pocket.  Heat before exercises and ice after ward Run only 3 times a week. Biking or swimming cross training Tell John Calcific glut minimus tendonitis  Exercises 3 times a week Continue the Vitamin D  Pennsaid 2 times daily.  See me again in 3 weeks

## 2014-02-25 NOTE — Assessment & Plan Note (Signed)
Patient does have some calcific changes of the gluteus minimus tendon that is likely giving patient is to discomfort. We discussed manual therapy, icing, home exercise program was given to him the handouts warm. Patient will try these interventions and come back again in 3 weeks. If he continues to have pain we may need to consider injection.

## 2014-03-18 ENCOUNTER — Ambulatory Visit (INDEPENDENT_AMBULATORY_CARE_PROVIDER_SITE_OTHER): Payer: Federal, State, Local not specified - PPO | Admitting: Family Medicine

## 2014-03-18 VITALS — BP 120/72 | HR 44 | Ht 73.5 in | Wt 178.0 lb

## 2014-03-18 DIAGNOSIS — S73191D Other sprain of right hip, subsequent encounter: Secondary | ICD-10-CM

## 2014-03-18 DIAGNOSIS — Z5189 Encounter for other specified aftercare: Secondary | ICD-10-CM

## 2014-03-18 DIAGNOSIS — IMO0002 Reserved for concepts with insufficient information to code with codable children: Secondary | ICD-10-CM

## 2014-03-18 DIAGNOSIS — I6350 Cerebral infarction due to unspecified occlusion or stenosis of unspecified cerebral artery: Secondary | ICD-10-CM

## 2014-03-18 NOTE — Patient Instructions (Signed)
Good to see you.  You are doing great On the plane ride have the ball in the pocket.  Compression socks and shorts are a great idea. Start running 3-4 times a week for next 2 weeks. Then 4-5 times a week.  Hip abductors on wall or your machine.  But have heel and butt in contact with wall.  Raise 6 inches down slow for count of 4 seconds 30 reps each side daily.  For the foot remove the cleat Good luck overall! Come back 3-4 weeks for the foot and we will scan it and make sure healed.

## 2014-03-18 NOTE — Progress Notes (Signed)
  [name] [address] [place], [place] [address] Phone: [phone] Subjective:       CC: Hip pain left followup  [id number] Richard Ellis is a 68 y.o. male coming in for followup of hip pain. 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 Patient has been doing home exercises and working on mostly stretching as well as crosstraining avoiding excessive running at this time. Patient has decreased his mileage per week to 20 miles per week doing more speak training than true endurance. Patient is biking and swimming for the other portion of his workout. 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 Patient states overall he feels about 90% better. Patient is no longer having any pain at all times for any pain that is stopping him from any activity. Patient is also seen another provider for a small avulsion fracture at the second MTP joint and states that this is almost completely resolved at this time as well. Denies any new symptoms.     Past medical history, social, surgical and family history all reviewed in electronic medical record.   Review of Systems: No headache, visual changes, nausea, vomiting, diarrhea, constipation, dizziness, abdominal pain, skin rash, fevers, chills, night sweats, weight loss, swollen lymph nodes, body aches, joint swelling, muscle aches, chest pain, shortness of breath, mood changes.   Objective Blood pressure 120/72, pulse 44, height 6' 1.5" (1.867 m), weight 178 lb (80.74 kg), SpO2 97.00%.  General: No apparent distress alert and oriented x3 mood and affect normal, dressed appropriately.  HEENT: Pupils equal, extraocular movements intact  Respiratory: Patient's speak in full sentences and does not appear short of breath  Cardiovascular: No lower extremity edema, non tender, no erythema  Skin: Warm dry intact with no signs of infection  or rash on extremities or on axial skeleton.  Abdomen: Soft nontender  Neuro: Cranial nerves II through XII are intact, neurovascularly intact in all extremities with 2+ DTRs and 2+ pulses.  Lymph: No lymphadenopathy of posterior or anterior cervical chain or axillae bilaterally.  Gait normal with good balance and coordination.  MSK:  Non tender with full range of motion and good stability and symmetric strength and tone of shoulders, elbows, wrist,  knee and ankles bilaterally.  Hip: Left ROM IR: 40 Deg, ER: 45 Deg, Flexion: 120 Deg, Extension: 100 Deg, Abduction: 45 Deg, Adduction: 45 Deg Strength IR: 5/5, ER: 5/5, Flexion: 5/5, Extension: 5/5, Abduction: 5/5, Adduction: 5/5 Pelvic alignment unremarkable to inspection and palpation. Standing hip rotation and gait without trendelenburg sign / unsteadiness. Greater trochanter without tenderness to palpation. Patient is no longer tender to palpation at the insertion of the gluteus tendons on the posterior aspect of the greater trochanteric area. No tenderness over piriformis and greater trochanter. No pain with FABER or FADIR. No SI joint tenderness and normal minimal SI movement. Overall more improvement with range of motion as well.     Impression and Recommendations:     This case required medical decision making of moderate complexity.

## 2014-03-18 NOTE — Assessment & Plan Note (Signed)
Patient is unremarkable well at this time. Patient states that he is approximately 90% better. Patient at this time is going to start doing more progression and strengthening exercises. Patient was given strengthening exercises for the hip abductor. We are going to slowly increase the amount of running he is doing weekly. Patient does have the national championships next week and I think he is going to do very well. Patient and will follow up again in 3-4 weeks for further evaluation and treatment.  Spent greater than 25 minutes with patient face-to-face and had greater than 50% of counseling including as described above in assessment and plan.

## 2014-03-19 ENCOUNTER — Encounter: Payer: Self-pay | Admitting: Family Medicine

## 2014-03-28 ENCOUNTER — Encounter: Payer: Self-pay | Admitting: Sports Medicine

## 2014-03-28 ENCOUNTER — Encounter: Payer: Self-pay | Admitting: Family Medicine

## 2014-04-07 ENCOUNTER — Other Ambulatory Visit: Payer: Self-pay | Admitting: Internal Medicine

## 2014-04-08 ENCOUNTER — Ambulatory Visit (INDEPENDENT_AMBULATORY_CARE_PROVIDER_SITE_OTHER): Payer: Federal, State, Local not specified - PPO | Admitting: Family Medicine

## 2014-04-08 ENCOUNTER — Encounter: Payer: Self-pay | Admitting: Family Medicine

## 2014-04-08 VITALS — BP 122/80 | HR 44 | Ht 73.5 in | Wt 180.0 lb

## 2014-04-08 DIAGNOSIS — S73191D Other sprain of right hip, subsequent encounter: Secondary | ICD-10-CM

## 2014-04-08 DIAGNOSIS — Z5189 Encounter for other specified aftercare: Secondary | ICD-10-CM

## 2014-04-08 DIAGNOSIS — M79609 Pain in unspecified limb: Secondary | ICD-10-CM

## 2014-04-08 DIAGNOSIS — I6350 Cerebral infarction due to unspecified occlusion or stenosis of unspecified cerebral artery: Secondary | ICD-10-CM

## 2014-04-08 DIAGNOSIS — IMO0002 Reserved for concepts with insufficient information to code with codable children: Secondary | ICD-10-CM

## 2014-04-08 DIAGNOSIS — M79672 Pain in left foot: Secondary | ICD-10-CM | POA: Insufficient documentation

## 2014-04-08 NOTE — Assessment & Plan Note (Signed)
Patient is doing unremarkable he well at this time. We discussed different treatment options and continuing the exercises regularly. Patient continues to do well we will not change management. Patient follow up with me on an as-needed basis.

## 2014-04-08 NOTE — Assessment & Plan Note (Signed)
Patient continues to have a slowly healing stress fracture. Patient continues to run and will likely not decrease the amount of running he is doing at this time. We discussed that decreasing 2 times a week for the next 3 weeks would be beneficial. Patient is actually going out of town and doing hiking and we discussed proper shoe such as rigid soled shoes are to be beneficial. Discuss continuing the vitamin D supplementation. Patient will call in 2-3 weeks. At continuing to have pain we talked to him about the potential for a nitroglycerin patch. Patient does have a history of having no and acute ischemic stroke and we told him that this may be concerning. We would start at significantly low dose if necessary minimal. Patient will continue to improve. Patient understands risks and benefits and was given a handout today discussing this in greater detail as well. Otherwise patient will followup on an as-needed basis.  Spent greater than 25 minutes with patient face-to-face and had greater than 50% of counseling including as described above in assessment and plan.

## 2014-04-08 NOTE — Progress Notes (Signed)
[name] [address] [place], [place] [address] Phone: [phone] Subjective:       CC: Hip pain left followup  [id number] Richard Ellis is a 67 y.o. male coming in for followup of hip pain. 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 Patient continues to run 3 times a week and did very well at the [employer]. Patient is having really no pain and states that he is 99.9% there. States that he is very happy with the results.  Patient does continuing to have left foot pain. Patient was seen by Dr. Oneida Alar and was diagnosed with a stress fracture of the second metatarsal. Patient has been taking vitamin D supplementation in trying to avoid significant amount of stress. Patient states that it does not seem to hurt while doing excessive activity such as running but it hurts after activity. Patient describes it as a throbbing sensation. Continues to improve slowly but is causing him not to run 5 days a week which he would like to do. No new symptoms.     Past medical history, social, surgical and family history all reviewed in electronic medical record.   Review of Systems: No headache, visual changes, nausea, vomiting, diarrhea, constipation, dizziness, abdominal pain, skin rash, fevers, chills, night sweats, weight loss, swollen lymph nodes, body aches, joint swelling, muscle aches, chest pain, shortness of breath, mood changes.   Objective Blood pressure 122/80, pulse 44, height 6' 1.5" (1.867 m), weight 180 lb (81.647 kg), SpO2 97.00%.  General: No apparent distress alert and oriented x3 mood and affect normal, dressed appropriately.  HEENT: Pupils equal, extraocular movements intact  Respiratory: Patient's speak in full sentences and does not appear short of breath  Cardiovascular: No lower extremity edema, non tender, no erythema  Skin: Warm dry intact with no signs of infection or rash on extremities or on axial skeleton.    Abdomen: Soft nontender  Neuro: Cranial nerves II through XII are intact, neurovascularly intact in all extremities with 2+ DTRs and 2+ pulses.  Lymph: No lymphadenopathy of posterior or anterior cervical chain or axillae bilaterally.  Gait normal with good balance and coordination.  MSK:  Non tender with full range of motion and good stability and symmetric strength and tone of shoulders, elbows, wrist,  knee and ankles bilaterally.  Hip: Left ROM IR: 40 Deg, ER: 45 Deg, Flexion: 120 Deg, Extension: 100 Deg, Abduction: 45 Deg, Adduction: 45 Deg Strength IR: 5/5, ER: 5/5, Flexion: 5/5, Extension: 5/5, Abduction: 4/5, Adduction: 5/5 Pelvic alignment unremarkable to inspection and palpation. Standing hip rotation and gait without trendelenburg sign / unsteadiness. Greater trochanter without tenderness to palpation. No tenderness over piriformis and greater trochanter. No pain with FABER or FADIR. No SI joint tenderness and normal minimal SI movement. Overall more improvement with range of motion as well.  Foot exam shows the patient does have some mild breakdown of the transverse arch especially of the left foot. Patient is still minimally tender to palpation on the fibular side on the dorsal aspect of the second toe just proximal to the MP joint.  Limited muscular skeletal ultrasound was performed and interpreted by me today. Limited ultrasound shows the patient does have a callus formation over the area where the fracture was. There is still some increasing Doppler flow and some mild hypoechoic changes in this area. Does not appear that the callus is fully bridged the fracture at this time.  Impression: Slowly healing cortical defect  of the second MTP   Impression and Recommendations:     This case required medical decision making of moderate complexity.

## 2014-04-08 NOTE — Patient Instructions (Signed)
Good to see you and congrats!!!! Continue what you are doing.  Rigid sole shoe with walking, favorite is [employer] laboratory [employer].  Have fun on trip If in 2-3 weeks foot is not improving we cna try nitro and call me.  Nitroglycerin Protocol   Apply 1/4 nitroglycerin patch to affected area daily.  Change position of patch within the affected area every 24 hours.  You may experience a headache during the first 1-2 weeks of using the patch, these should subside.  If you experience headaches after beginning nitroglycerin patch treatment, you may take your preferred over the counter pain reliever.  Another side effect of the nitroglycerin patch is skin irritation or rash related to patch adhesive.  Please notify our office if you develop more severe headaches or rash, and stop the patch.  Tendon healing with nitroglycerin patch may require 12 to 24 weeks depending on the extent of injury.  Men should not use if taking Viagra, Cialis, or Levitra.   Do not use if you have migraines or rosacea.  Then after starting would want to see you in 2 weeks.  Otherwise see me when you need me.

## 2014-05-07 ENCOUNTER — Ambulatory Visit (INDEPENDENT_AMBULATORY_CARE_PROVIDER_SITE_OTHER): Payer: Federal, State, Local not specified - PPO | Admitting: Cardiovascular Disease

## 2014-05-07 ENCOUNTER — Encounter: Payer: Self-pay | Admitting: Cardiovascular Disease

## 2014-05-07 VITALS — BP 110/58 | HR 40 | Ht 73.5 in | Wt 181.0 lb

## 2014-05-07 DIAGNOSIS — E785 Hyperlipidemia, unspecified: Secondary | ICD-10-CM

## 2014-05-07 DIAGNOSIS — I6350 Cerebral infarction due to unspecified occlusion or stenosis of unspecified cerebral artery: Secondary | ICD-10-CM

## 2014-05-07 MED ORDER — ROSUVASTATIN CALCIUM 5 MG PO TABS: ORAL_TABLET | ORAL | Status: DC

## 2014-05-07 NOTE — Progress Notes (Signed)
HPI:  68 year old gentleman presenting for follow-up of PFO with history of stroke. In October 2014 developed a severe posterior headache and central vision loss. He had no previous history of headache. An MRI of the brain showed a right occipital cortical infarct. He underwent routine evaluation in the hospital with telemetry, carotid Dopplers, and the surface echocardiogram. All of these showed essentially normal findings. He wore an event monitor that showed no evidence of atrial fibrillation. A TEE was done as an outpatient and this demonstrated a small PFO with spontaneous right to left flow increasing with Valsalva. LV function was normal and there were no valvular abnormalities.  Overall the patient is doing well. He was not able to tolerate Crestor because of muscle pains. He denies any recurrent stroke or TIA symptoms. He continues to exercise vigorously. 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 He denies chest pain, palpitations, shortness of breath, or other complaints. He has not had easy bruising on Plavix.   Outpatient Encounter Prescriptions as of 05/07/2014  Medication Sig  . Cholecalciferol 2000 UNITS CAPS Take 1 capsule by mouth daily.    . clopidogrel (PLAVIX) 75 MG tablet TAKE ONE TABLET BY MOUTH ONE TIME DAILY   . Coenzyme Q-10 200 MG CAPS Take 200 mg by mouth daily.  . Diclofenac Sodium (PENNSAID) 2 % SOLN Place 2 application onto the skin 2 (two) times daily.  [name] ibuprofen (ADVIL,MOTRIN) 200 MG tablet Take 400 mg by mouth every 6 (six) hours as needed (for pain).   [name] levothyroxine (SYNTHROID, LEVOTHROID) 25 MCG tablet TAKE ONE TABLET BY MOUTH ONE TIME DAILY   . Magnesium 250 MG TABS Take 250 mg by mouth daily.  [name] omega-3 acid ethyl esters (LOVAZA) 1 G capsule Take 2 capsules (2 g total) by mouth 2 (two) times daily.  . sildenafil (VIAGRA) 100 MG tablet Take 1/4-1/2  tablet by mouth as needed  . [DISCONTINUED] clopidogrel (PLAVIX) 75 MG tablet Take 1  tablet (75 mg total) by mouth daily.  . [DISCONTINUED] levothyroxine (SYNTHROID, LEVOTHROID) 25 MCG tablet TAKE ONE TABLET BY MOUTH ONE TIME DAILY     Allergies  Allergen Reactions  . Atorvastatin Other (See Comments)    Muscle weakness  . Crestor [Rosuvastatin]     Muscle weakness  . Demerol [Meperidine] Nausea Only  . Niacin And Related Itching and Other (See Comments)    Palms turned red and itched    Past Medical History  Diagnosis Date  . Hyperlipidemia   . Allergy     rhinitis  . Bronchitis   . Hypothyroidism   . Heart murmur     "as a child" (07/02/2013)  . Chronic bronchitis     "q year" (07/02/2013)  . Anemia     "Hgb always on the low side" (07/02/2013)  . Esophageal stricture   . Stroke 07/02/2013    "small ischemic occipital right sided" (07/02/2013)  . Osteoarthritis     "maybe a little bit in my spine" (07/02/2013)  . PFO (patent foramen ovale)     with right to left shunting    ROS: Negative except as per HPI  BP 110/58  Pulse 40  Ht 6' 1.5" (1.867 m)  Wt 181 lb (82.101 kg)  BMI 23.55 kg/m2  PHYSICAL EXAM: Pt is alert and oriented, physically fit male in NAD HEENT: normal Neck: JVP - normal, carotids 2+= without bruits Lungs: CTA bilaterally CV: Bradycardic and regular without murmur or gallop Abd: soft, NT,  Positive BS, no hepatomegaly Ext: no C/C/E, distal pulses intact and equal Skin: warm/dry no rash  ASSESSMENT AND PLAN: 1. PFO with history of stroke. Continue Plavix. Followup in 1 year.  2. Hyperlipidemia. Most recent lipids reviewed as below:  Lipid Panel     Component Value Date/Time   CHOL 269* 02/02/2014 1053   TRIG 116.0 02/02/2014 1053   HDL 57.90 02/02/2014 1053   CHOLHDL 5 02/02/2014 1053   VLDL 23.2 02/02/2014 1053   LDLCALC 188* 02/02/2014 1053   LDL cholesterol is markedly elevated. Advised another trial of Crestor 5 mg twice weekly with repeat lipids and LFTs in 3 months. If he is unable to tolerate this, will refer to  lipid clinic.  [name] 05/07/2014 12:37 PM

## 2014-05-07 NOTE — Patient Instructions (Addendum)
Your physician has recommended you make the following change in your medication:  1. START Crestor 5mg  take one by mouth daily on Monday and Thursday  Your physician recommends that you return for a FASTING LIPID and LIVER profile in 3 MONTHS--nothing to eat or drink after midnight, lab opens at 7:30 AM (08/10/14)   Your physician wants you to follow-up in: 1 YEAR with Dr Burt Knack.  You will receive a reminder letter in the mail two months in advance. If you don't receive a letter, please call our office to schedule the follow-up appointment.

## 2014-05-27 ENCOUNTER — Ambulatory Visit (INDEPENDENT_AMBULATORY_CARE_PROVIDER_SITE_OTHER): Payer: Federal, State, Local not specified - PPO | Admitting: Internal Medicine

## 2014-05-27 ENCOUNTER — Encounter: Payer: Self-pay | Admitting: Internal Medicine

## 2014-05-27 VITALS — BP 118/72 | HR 45 | Temp 98.2°F | Wt 183.1 lb

## 2014-05-27 DIAGNOSIS — I6350 Cerebral infarction due to unspecified occlusion or stenosis of unspecified cerebral artery: Secondary | ICD-10-CM

## 2014-05-27 DIAGNOSIS — H6092 Unspecified otitis externa, left ear: Secondary | ICD-10-CM

## 2014-05-27 DIAGNOSIS — H60399 Other infective otitis externa, unspecified ear: Secondary | ICD-10-CM

## 2014-05-27 DIAGNOSIS — R7309 Other abnormal glucose: Secondary | ICD-10-CM

## 2014-05-27 MED ORDER — NEOMYCIN-POLYMYXIN-HC 3.5-10000-1 OT SOLN: 3.0000 [drp] | Freq: Four times a day (QID) | OTIC | Status: DC

## 2014-05-27 MED ORDER — AMOXICILLIN-POT CLAVULANATE 875-125 MG PO TABS: 1.0000 | ORAL_TABLET | Freq: Two times a day (BID) | ORAL | Status: DC

## 2014-05-27 NOTE — Progress Notes (Signed)
Subjective:    Patient ID: Richard Ellis, male    DOB: December 12, 1945, 68 y.o.   MRN: [id number]  HPI   Here with 2-3 days acute onset fever, left ear pain, pressure, headache, general weakness and malaise, and greenish d/c, but pt denies chest pain, wheezing, increased sob or doe, orthopnea, PND, increased LE swelling, palpitations, dizziness or syncope.   Pt denies polydipsia, polyuria, Pt denies new neurological symptoms such as new headache, or facial or extremity weakness or numbness Past Medical History  Diagnosis Date  . Hyperlipidemia   . Allergy     rhinitis  . Bronchitis   . Hypothyroidism   . Heart murmur     "as a child" (07/02/2013)  . Chronic bronchitis     "q year" (07/02/2013)  . Anemia     "Hgb always on the low side" (07/02/2013)  . Esophageal stricture   . Stroke 07/02/2013    "small ischemic occipital right sided" (07/02/2013)  . Osteoarthritis     "maybe a little bit in my spine" (07/02/2013)  . PFO (patent foramen ovale)     with right to left shunting   Past Surgical History  Procedure Laterality Date  . Esophagogastroduodenoscopy N/A 10/26/2012    Procedure: ESOPHAGOGASTRODUODENOSCOPY (EGD);  Surgeon: Winfield Cunas., MD;  Location[facility] ENDOSCOPY;  Service: Endoscopy;  Laterality: N/A;  . Tonsillectomy  1950's  . Esophagogastroduodenoscopy N/A 07/08/2013    Procedure: ESOPHAGOGASTRODUODENOSCOPY (EGD);  Surgeon: Winfield Cunas., MD;  Location: [facility] ENDOSCOPY;  Service: Endoscopy;  Laterality: N/A;  . Savory dilation N/A 07/08/2013    Procedure: SAVORY DILATION;  Surgeon: Winfield Cunas., MD;  Location: [facility] ENDOSCOPY;  Service: Endoscopy;  Laterality: N/A;  . Tee without cardioversion N/A 07/28/2013    Procedure: TRANSESOPHAGEAL ECHOCARDIOGRAM (TEE);  Surgeon: Pixie Casino, MD;  Location: [facility] ENDOSCOPY;  Service: Cardiovascular;  Laterality: N/A;    reports that he has never smoked. He has never used smokeless tobacco. He reports that he drinks about  4.8 ounces of alcohol per week. He reports that he does not use illicit drugs. family history includes Coronary artery disease in his other; Heart disease in his father. There is no history of Colon cancer. Allergies  Allergen Reactions  . Atorvastatin Other (See Comments)    Muscle weakness  . Crestor [Rosuvastatin]     Muscle weakness  . Demerol [Meperidine] Nausea Only  . Niacin And Related Itching and Other (See Comments)    Palms turned red and itched   Current Outpatient Prescriptions on File Prior to Visit  Medication Sig Dispense Refill  . Cholecalciferol 2000 UNITS CAPS Take 1 capsule by mouth daily.        . clopidogrel (PLAVIX) 75 MG tablet TAKE ONE TABLET BY MOUTH ONE TIME DAILY   90 tablet  0  . Coenzyme Q-10 200 MG CAPS Take 200 mg by mouth daily.      . Diclofenac Sodium (PENNSAID) 2 % SOLN Place 2 application onto the skin 2 (two) times daily.  112 g  3  . ibuprofen (ADVIL,MOTRIN) 200 MG tablet Take 400 mg by mouth every 6 (six) hours as needed (for pain).       [name] levothyroxine (SYNTHROID, LEVOTHROID) 25 MCG tablet TAKE ONE TABLET BY MOUTH ONE TIME DAILY   30 tablet  4  . Magnesium 250 MG TABS Take 250 mg by mouth daily.      [name] omega-3 acid ethyl esters (LOVAZA) 1 G capsule  Take 2 capsules (2 g total) by mouth 2 (two) times daily.  360 capsule  3  . rosuvastatin (CRESTOR) 5 MG tablet Take one tablet by mouth on Monday and Thursday  30 tablet  3  . sildenafil (VIAGRA) 100 MG tablet Take 1/4-1/2  tablet by mouth as needed       No current facility-administered medications on file prior to visit.   Review of Systems All otherwise neg per pt     Objective:   Physical Exam BP 118/72  Pulse 45  Temp(Src) 98.2 F (36.8 C) (Oral)  Wt 183 lb 2 oz (83.065 kg)  SpO2 98% VS noted,  Constitutional: Pt appears well-developed, well-nourished.  HENT: Head: NCAT.  Right Ear: External ear normal.  Left Ear: External ear normal.  Eyes: . Pupils are equal, round, and reactive  to light. Conjunctivae and EOM are normal Left canal with peri-TM erythema/sweling/mucoid d/c - mild Neck: Normal range of motion. Neck supple.  Cardiovascular: Normal rate and regular rhythm.   Pulmonary/Chest: Effort normal and breath sounds normal.  Neurological: Pt is alert. Not confused , motor grossly intact Skin: Skin is warm. No rash Psychiatric: Pt behavior is normal. No agitation.     Assessment & Plan:

## 2014-05-27 NOTE — Patient Instructions (Signed)
Please take all new medication as prescribed  - the ear drops  You are also given the augmentin, but no need to take if the ear drops help  Please continue all other medications as before, and refills have been done if requested.  Please have the pharmacy call with any other refills you may need.  Please keep your appointments with your specialists as you may have planned

## 2014-05-27 NOTE — Progress Notes (Signed)
Pre visit review using our clinic review tool, if applicable. No additional management support is needed unless otherwise documented below in the visit note. 

## 2014-05-29 NOTE — Assessment & Plan Note (Signed)
Mild to mod, for antibx course,  to f/u any worsening symptoms or concerns 

## 2014-05-29 NOTE — Assessment & Plan Note (Signed)
stable overall by history and exam, recent data reviewed with pt, and pt to continue medical treatment as before,  to f/u any worsening symptoms or concerns Lab Results  Component Value Date   HGBA1C 5.8 02/02/2014

## 2014-06-08 ENCOUNTER — Encounter: Payer: Self-pay | Admitting: Internal Medicine

## 2014-06-08 ENCOUNTER — Ambulatory Visit (INDEPENDENT_AMBULATORY_CARE_PROVIDER_SITE_OTHER): Payer: Federal, State, Local not specified - PPO | Admitting: Internal Medicine

## 2014-06-08 VITALS — BP 110/70 | HR 42 | Temp 98.2°F | Wt 180.0 lb

## 2014-06-08 DIAGNOSIS — E039 Hypothyroidism, unspecified: Secondary | ICD-10-CM

## 2014-06-08 DIAGNOSIS — I639 Cerebral infarction, unspecified: Secondary | ICD-10-CM

## 2014-06-08 DIAGNOSIS — E785 Hyperlipidemia, unspecified: Secondary | ICD-10-CM | POA: Insufficient documentation

## 2014-06-08 DIAGNOSIS — I6350 Cerebral infarction due to unspecified occlusion or stenosis of unspecified cerebral artery: Secondary | ICD-10-CM

## 2014-06-08 MED ORDER — PITAVASTATIN CALCIUM 2 MG PO TABS: ORAL_TABLET | ORAL | Status: DC

## 2014-06-08 NOTE — Assessment & Plan Note (Signed)
On Plavix Will try Livalo

## 2014-06-08 NOTE — Assessment & Plan Note (Signed)
Statin intolerant Will try Livalo

## 2014-06-08 NOTE — Patient Instructions (Signed)
   Milk free trial (no milk, ice cream, cheese and yogurt) for 4-6 weeks. OK to use almond, coconut, rice milk. "Almond breeze" brand tastes good.  

## 2014-06-13 ENCOUNTER — Encounter: Payer: Self-pay | Admitting: Internal Medicine

## 2014-06-13 NOTE — Assessment & Plan Note (Signed)
Continue with current prescription therapy as reflected on the Med lis

## 2014-06-13 NOTE — Assessment & Plan Note (Signed)
Continue with current prescription therapy as reflected on the Med list.  

## 2014-06-13 NOTE — Progress Notes (Signed)
   Subjective:    HPI   He stopped his statin due to myalgias   Richard Ellis is exercising a lot: running, weights The patient presents for a follow-up of  chronic hypothyroidism, chronic dyslipidemia, elev glucose controlled with medicines and diet.    BP Readings from Last 3 Encounters:  06/08/14 110/70  05/27/14 118/72  05/07/14 110/58   Wt Readings from Last 3 Encounters:  06/08/14 180 lb (81.647 kg)  05/27/14 183 lb 2 oz (83.065 kg)  05/07/14 181 lb (82.101 kg)     Review of Systems  Constitutional: Negative for appetite change, fatigue and unexpected weight change.  HENT: Negative for congestion, nosebleeds, sneezing, sore throat and trouble swallowing.   Eyes: Negative for itching and visual disturbance.  Respiratory: Negative for cough.   Cardiovascular: Negative for chest pain, palpitations and leg swelling.  Gastrointestinal: Negative for nausea, diarrhea, blood in stool and abdominal distention.  Genitourinary: Negative for frequency and hematuria.  Musculoskeletal: Negative for arthralgias, back pain, gait problem, joint swelling, myalgias and neck pain.  Skin: Negative for color change, rash and wound.  Neurological: Negative for dizziness, tremors, speech difficulty and weakness.  Psychiatric/Behavioral: Negative for suicidal ideas, sleep disturbance, dysphoric mood and agitation. The patient is not nervous/anxious.        Objective:   Physical Exam  Constitutional: He is oriented to person, place, and time. He appears well-developed.  HENT:  Mouth/Throat: Oropharynx is clear and moist.  Eyes: Conjunctivae are normal. Pupils are equal, round, and reactive to light.  Neck: Normal range of motion. No JVD present. No thyromegaly present.  Cardiovascular: Normal rate, regular rhythm, normal heart sounds and intact distal pulses.  Exam reveals no gallop and no friction rub.   No murmur heard. Pulmonary/Chest: Effort normal and breath sounds normal. No respiratory  distress. He has no wheezes. He has no rales. He exhibits no tenderness.  Abdominal: Soft. Bowel sounds are normal. He exhibits no distension and no mass. There is no tenderness. There is no rebound and no guarding.  Genitourinary: Rectum normal. Guaiac negative stool. No penile tenderness.  Musculoskeletal: Normal range of motion. He exhibits no edema and no tenderness.  Lymphadenopathy:    He has no cervical adenopathy.  Neurological: He is alert and oriented to person, place, and time. He has normal reflexes. No cranial nerve deficit. He exhibits normal muscle tone. Coordination normal.  Skin: Skin is warm and dry. No rash noted.  Psychiatric: He has a normal mood and affect. His behavior is normal. Judgment and thought content normal.       Lab Results  Component Value Date   WBC 4.1 02/02/2014   HGB 12.9* 02/02/2014   HCT 37.9* 02/02/2014   PLT 157.0 02/02/2014   GLUCOSE 91 02/02/2014   CHOL 269* 02/02/2014   TRIG 116.0 02/02/2014   HDL 57.90 02/02/2014   LDLDIRECT 168.2 08/06/2012   LDLCALC 188* 02/02/2014   ALT 26 02/02/2014   AST 29 02/02/2014   NA 138 02/02/2014   K 3.9 02/02/2014   CL 106 02/02/2014   CREATININE 1.1 02/02/2014   BUN 22 02/02/2014   CO2 26 02/02/2014   TSH 2.91 02/02/2014   PSA 3.82 02/02/2014   INR 1.00 07/02/2013   HGBA1C 5.8 02/02/2014     Assessment & Plan:

## 2014-07-02 ENCOUNTER — Other Ambulatory Visit: Payer: Self-pay

## 2014-07-07 ENCOUNTER — Ambulatory Visit (INDEPENDENT_AMBULATORY_CARE_PROVIDER_SITE_OTHER): Payer: Federal, State, Local not specified - PPO | Admitting: Internal Medicine

## 2014-07-07 ENCOUNTER — Encounter: Payer: Self-pay | Admitting: Internal Medicine

## 2014-07-07 VITALS — BP 118/76 | HR 51 | Temp 98.6°F | Wt 180.5 lb

## 2014-07-07 DIAGNOSIS — R059 Cough, unspecified: Secondary | ICD-10-CM

## 2014-07-07 DIAGNOSIS — I639 Cerebral infarction, unspecified: Secondary | ICD-10-CM

## 2014-07-07 DIAGNOSIS — J310 Chronic rhinitis: Secondary | ICD-10-CM

## 2014-07-07 DIAGNOSIS — R05 Cough: Secondary | ICD-10-CM

## 2014-07-07 MED ORDER — DOXYCYCLINE HYCLATE 50 MG PO CAPS: 50.0000 mg | ORAL_CAPSULE | Freq: Two times a day (BID) | ORAL | Status: DC

## 2014-07-07 MED ORDER — HYDROCODONE-HOMATROPINE 5-1.5 MG/5ML PO SYRP: 5.0000 mL | ORAL_SOLUTION | Freq: Four times a day (QID) | ORAL | Status: DC | PRN

## 2014-07-07 NOTE — Progress Notes (Signed)
   Subjective:    Patient ID: Richard Ellis, male    DOB: 21-Aug-1946, 68 y.o.   MRN: [id number]  HPI   Symptoms began as loose stool and cough with clear sputum one week ago. The loose stool has resolved. Today he is producing clear rhinitis in the context of sinus pressure &  intermittent sore throat.  Minor wheezing intermittently.He has used codeine cough syrup with some control of the cough.    Review of Systems   He denies any ear pain or otic discharge  No significant frontal sinus or facial sinus pain. He has no nasal purulence.  The cough is not associated with shortness of breath   He has no extrinsic symptoms of itchy, watery eyes, sneezing.     Objective:   Physical Exam  General appearance:good health ;well nourished; no acute distress or increased work of breathing is present.  No  lymphadenopathy about the head, neck, or axilla noted.   Eyes: No conjunctival inflammation or lid edema is present. There is no scleral icterus.  Ears:  External ear exam shows no significant lesions or deformities.  Otoscopic examination reveals clear canals, tympanic membranes are intact bilaterally without bulging, retraction, inflammation or discharge.  Nose:  External nasal examination shows no deformity or inflammation. Nasal mucosa are pink and moist without lesions or exudates. No septal dislocation or deviation.No obstruction to airflow.   Oral exam: Dental hygiene is good; lips and gums are healthy appearing.There is no oropharyngeal erythema or exudate noted.   Neck:  No deformities, thyromegaly, masses, or tenderness noted.   Supple with full range of motion without pain.   Heart:  Normal rate and regular rhythm. S1 and S2 normal without gallop, murmur, click, rub or other extra sounds.   Lungs:Chest clear to auscultation; no wheezes, rhonchi,rales ,or rubs present.No increased work of breathing.    Extremities:  No cyanosis, edema, or clubbing  noted    Skin: Warm & dry  w/o jaundice or tenting.         Assessment & Plan:  #1 cough #2 non allergic rhinitis See Orders & AVS

## 2014-07-07 NOTE — Patient Instructions (Addendum)
Plain Mucinex (NOT D) for thick secretions ;force NON dairy fluids .   Nasal cleansing in the shower as discussed with lather of mild shampoo.After 10 seconds wash off lather while  exhaling through nostrils. Make sure that all residual soap is removed to prevent irritation.  Flonase OR Nasacort AQ 1 spray in each nostril twice a day as needed. Use the "crossover" technique into opposite nostril spraying toward opposite ear @ 45 degree angle, not straight up into nostril.  Use a Neti pot daily only  as needed for significant sinus congestion; going from open side to congested side . Plain Allegra (NOT D )  160 daily , Loratidine 10 mg , OR Zyrtec 10 mg @ bedtime  as needed for itchy eyes & sneezing. Fill the  prescription for antibiotic it there is not dramatic improvement in the next 72 hours.  Zicam Melts or Zinc lozenges as per package label for sore throat . Complementary options include  vitamin C 2000 mg daily; & Echinacea for 4-7 days. Report persistent or progressive fever; discolored nasal or chest secretions; or frontal headache or facial  pain.

## 2014-07-07 NOTE — Progress Notes (Signed)
Pre visit review using our clinic review tool, if applicable. No additional management support is needed unless otherwise documented below in the visit note. 

## 2014-07-25 ENCOUNTER — Other Ambulatory Visit: Payer: Self-pay | Admitting: Internal Medicine

## 2014-08-10 ENCOUNTER — Other Ambulatory Visit (INDEPENDENT_AMBULATORY_CARE_PROVIDER_SITE_OTHER): Payer: Federal, State, Local not specified - PPO | Admitting: *Deleted

## 2014-08-10 DIAGNOSIS — E785 Hyperlipidemia, unspecified: Secondary | ICD-10-CM

## 2014-08-10 LAB — LIPID PANEL
Cholesterol: 292 mg/dL — ABNORMAL HIGH (ref 0–200)
HDL: 49 mg/dL (ref >39.00)
LDL Cholesterol: 204 mg/dL — ABNORMAL HIGH (ref 0–99)
NonHDL: 243
Total CHOL/HDL Ratio: 6
Triglycerides: 197 mg/dL — ABNORMAL HIGH (ref 0.0–149.0)
VLDL: 39.4 mg/dL (ref 0.0–40.0)

## 2014-08-10 LAB — HEPATIC FUNCTION PANEL
ALT: 21 U/L (ref 0–53)
AST: 27 U/L (ref 0–37)
Albumin: 4.2 g/dL (ref 3.5–5.2)
Alkaline Phosphatase: 47 U/L (ref 39–117)
Bilirubin, Direct: 0 mg/dL (ref 0.0–0.3)
Total Bilirubin: 0.7 mg/dL (ref 0.2–1.2)
Total Protein: 6.7 g/dL (ref 6.0–8.3)

## 2014-08-16 ENCOUNTER — Ambulatory Visit (INDEPENDENT_AMBULATORY_CARE_PROVIDER_SITE_OTHER): Payer: Federal, State, Local not specified - PPO | Admitting: Pharmacist

## 2014-08-16 DIAGNOSIS — E785 Hyperlipidemia, unspecified: Secondary | ICD-10-CM

## 2014-08-16 MED ORDER — EZETIMIBE 10 MG PO TABS: 10.0000 mg | ORAL_TABLET | Freq: Every day | ORAL | Status: DC

## 2014-08-16 NOTE — Patient Instructions (Signed)
Continue your current diet and exercise habits. Try to limit your intake of sweets and sugars.  Continue taking the Krill oil and CoQ10 daily.  Start taking Zetia 10mg  once daily.  Recheck labs in 3 months.

## 2014-08-16 NOTE — Progress Notes (Signed)
[name] is a 68 yo WM pt of Dr. Burt Knack that was referred to the lipid clinic due to an elevated lipid panel that is trending upward over the past year.  He has a PMH significant for stroke related to PFO but otherwise no cardiac history.  He does have a family history of heart disease: his father died of a heart attack at the age of 81; he uncle had a triple bypass and died before the age of 65; and his grandma died of a stroke at the age of 38.    Pt has tried several therapies in the past.  He failed atorvastatin and Crestor due to muscle pains and weakness developing even while taking CoQ10. The pt has an allergy due to niacin due his palms turning red and starting to itch. The pt was prescribed Livalo after failing Crestor, but states he never started the medication due to his previous problems with statins.  He states he is currently taking 1 capsule of krill oil once daily instead of Lovaza 2g BID due to his physician changing him. He is still taking CoQ10 200mg  once daily. He states he has never tried fenofibrate or Zetia.  The pt has a relatively healthy diet: oatmeal or cheerios with skim milk for breakfast; an almond butter sandwich with fruit or vegetables for lunch; fish or hamburger (some kind of lean meat) with vegetables for dinner. 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  RF: Family history, age  Goals: LDL <130 (optimally <100) Current Meds: Krill oil  Intolerances: Crestor(myalgias), atorvastatin (myalgias), Niacin (flushing and itching)  His lipid values as of 08/10/14: TC 292mg /dL TG 197mg /dL HDL 49mg /dL LDL 204mg /dL NonHDL 243mg /dL  Current Outpatient Prescriptions  Medication Sig Dispense Refill  . Cholecalciferol 2000 UNITS CAPS Take 1 capsule by mouth daily.      . clopidogrel (PLAVIX) 75 MG tablet TAKE ONE TABLET BY MOUTH ONE TIME DAILY  90 tablet 3  . Coenzyme Q-10 200 MG CAPS Take 200 mg  by mouth daily.    [name] doxycycline (VIBRAMYCIN) 50 MG capsule Take 1 capsule (50 mg total) by mouth 2 (two) times daily. 14 capsule 0  . ezetimibe (ZETIA) 10 MG tablet Take 1 tablet (10 mg total) by mouth daily. 30 tablet 6  . HYDROcodone-homatropine (HYDROMET) 5-1.5 MG/5ML syrup Take 5 mLs by mouth every 6 (six) hours as needed for cough. 120 mL 0  . ibuprofen (ADVIL,MOTRIN) 200 MG tablet Take 400 mg by mouth every 6 (six) hours as needed (for pain).     [name] KRILL OIL PO Take by mouth daily.    [name] levothyroxine (SYNTHROID, LEVOTHROID) 25 MCG tablet TAKE ONE TABLET BY MOUTH ONE TIME DAILY  30 tablet 4  . Magnesium 250 MG TABS Take 250 mg by mouth daily.    [name] neomycin-polymyxin-hydrocortisone (CORTISPORIN) otic solution Place 3 drops into the left ear 4 (four) times daily. 10 mL 0  . sildenafil (VIAGRA) 100 MG tablet Take 1/4-1/2  tablet by mouth as needed     No current facility-administered medications for this visit.   Allergies  Allergen Reactions  . Atorvastatin Other (See Comments)    Muscle weakness  . Crestor [Rosuvastatin]     Muscle weakness  . Demerol [Meperidine] Nausea Only  . Niacin And Related Itching and Other (See Comments)    Palms turned red and itched

## 2014-08-17 NOTE — Assessment & Plan Note (Signed)
Pt does not want to consider statin therapy at this time.  Other options include Zetia, fenofibrate or BAS.  Will start with Zetia given least likely to have SE.  If he is able to tolerate and needs additional therapy, may be able to get him to do once weekly statin in addition to zetia to get additive effects.  Will start with Zetia 10mg  once daily. He is to continue his krill oil and CoQ10 medications. He is to continue his current diet and exercise regimen. He is to return in 3 months to recheck labs.

## 2014-08-20 ENCOUNTER — Other Ambulatory Visit: Payer: Self-pay | Admitting: *Deleted

## 2014-08-20 MED ORDER — EZETIMIBE 10 MG PO TABS: 10.0000 mg | ORAL_TABLET | Freq: Every day | ORAL | Status: DC

## 2014-09-08 ENCOUNTER — Other Ambulatory Visit: Payer: Self-pay | Admitting: Internal Medicine

## 2014-09-29 ENCOUNTER — Ambulatory Visit (INDEPENDENT_AMBULATORY_CARE_PROVIDER_SITE_OTHER): Payer: Federal, State, Local not specified - PPO | Admitting: Internal Medicine

## 2014-09-29 ENCOUNTER — Encounter: Payer: Self-pay | Admitting: Internal Medicine

## 2014-09-29 VITALS — BP 142/90 | HR 53 | Temp 97.9°F | Ht 73.5 in | Wt 179.5 lb

## 2014-09-29 DIAGNOSIS — J0100 Acute maxillary sinusitis, unspecified: Secondary | ICD-10-CM

## 2014-09-29 DIAGNOSIS — R059 Cough, unspecified: Secondary | ICD-10-CM

## 2014-09-29 DIAGNOSIS — R05 Cough: Secondary | ICD-10-CM

## 2014-09-29 MED ORDER — AMOXICILLIN 500 MG PO CAPS: 500.0000 mg | ORAL_CAPSULE | Freq: Three times a day (TID) | ORAL | Status: DC

## 2014-09-29 MED ORDER — HYDROCODONE-HOMATROPINE 5-1.5 MG/5ML PO SYRP: 5.0000 mL | ORAL_SOLUTION | Freq: Four times a day (QID) | ORAL | Status: DC | PRN

## 2014-09-29 NOTE — Patient Instructions (Signed)

## 2014-09-29 NOTE — Progress Notes (Signed)
Pre visit review using our clinic review tool, if applicable. No additional management support is needed unless otherwise documented below in the visit note. 

## 2014-09-29 NOTE — Progress Notes (Signed)
   Subjective:    Patient ID: Richard Ellis, male    DOB: 1945/12/28, 69 y.o.   MRN: [id number]  HPI  Symptoms began 09/18/14 is a dry scratchy throat. This was followed by head congestion and nonproductive cough. He did have some itchy eyes with some dryness and crusting on the left in the mornings. He describes pressure/pain in the maxillary sinus areas, particularly on the left.  He describes green nasal discharge. He has a much lesser volume of green sputum  He's had persistent left earache in the setting of prior otitis for which he used ear drops . He's also been using Delsym and DayQuil.  Review of Systems He has no significant frontal sinus pain. He's had no dental pain, otic discharge, wheezing, shortness of breath, fever, chills, or sweats.    Objective:   Physical Exam  Pertinent or positive findings include: Pattern alopecia is present. The left tympanic membrane is dull with diffuse light reflex. He has no evidence of conjunctivitis in the left eye.  General appearance:good health ;well nourished; no acute distress or increased work of breathing is present.  No  lymphadenopathy about the head, neck, or axilla noted.   Eyes: No lid edema is present. There is no scleral icterus.  Ears:  External ear exam shows no significant lesions or deformities.  Otoscopic examination reveals clear canals, tympanic membranes are intact bilaterally without bulging, retraction, inflammation or discharge.  Nose:  External nasal examination shows no deformity or inflammation. Nasal mucosa are pink and moist without lesions or exudates. No septal dislocation or deviation.No obstruction to airflow.   Oral exam: Dental hygiene is good; lips and gums are healthy appearing.There is no oropharyngeal erythema or exudate noted.   Neck:  No deformities, thyromegaly, masses, or tenderness noted.   Supple with full range of motion without pain.   Heart:  Normal rate and regular rhythm. S1 and S2 normal  without gallop, murmur, click, rub or other extra sounds.   Lungs:Chest clear to auscultation; no wheezes, rhonchi,rales ,or rubs present.No increased work of breathing.    Extremities:  No cyanosis, edema, or clubbing  noted    Skin: Warm & dry w/o jaundice or tenting.     Assessment & Plan:  #1 acute maxillary sinusitis  Plan: See orders recommendations

## 2014-10-19 ENCOUNTER — Ambulatory Visit (INDEPENDENT_AMBULATORY_CARE_PROVIDER_SITE_OTHER): Payer: Federal, State, Local not specified - PPO | Admitting: Internal Medicine

## 2014-10-19 ENCOUNTER — Encounter: Payer: Self-pay | Admitting: Internal Medicine

## 2014-10-19 VITALS — BP 114/64 | HR 50 | Temp 97.9°F | Wt 181.0 lb

## 2014-10-19 DIAGNOSIS — R059 Cough, unspecified: Secondary | ICD-10-CM

## 2014-10-19 DIAGNOSIS — R05 Cough: Secondary | ICD-10-CM

## 2014-10-19 DIAGNOSIS — J4520 Mild intermittent asthma, uncomplicated: Secondary | ICD-10-CM

## 2014-10-19 DIAGNOSIS — E039 Hypothyroidism, unspecified: Secondary | ICD-10-CM

## 2014-10-19 DIAGNOSIS — J029 Acute pharyngitis, unspecified: Secondary | ICD-10-CM

## 2014-10-19 LAB — POCT RAPID STREP A (OFFICE): Rapid Strep A Screen: NEGATIVE

## 2014-10-19 MED ORDER — AZITHROMYCIN 250 MG PO TABS: ORAL_TABLET | ORAL | Status: DC

## 2014-10-19 MED ORDER — PROMETHAZINE-CODEINE 6.25-10 MG/5ML PO SYRP: 5.0000 mL | ORAL_SOLUTION | ORAL | Status: DC | PRN

## 2014-10-19 MED ORDER — METHYLPREDNISOLONE ACETATE 80 MG/ML IJ SUSP
80.0000 mg | Freq: Once | INTRAMUSCULAR | Status: AC
  Administered 2014-10-19: 80 mg via INTRAMUSCULAR

## 2014-10-19 MED ORDER — UMECLIDINIUM-VILANTEROL 62.5-25 MCG/INH IN AEPB: 1.0000 | INHALATION_SPRAY | Freq: Every day | RESPIRATORY_TRACT | Status: DC

## 2014-10-19 MED ORDER — BENZONATATE 200 MG PO CAPS: 200.0000 mg | ORAL_CAPSULE | Freq: Two times a day (BID) | ORAL | Status: DC | PRN

## 2014-10-19 NOTE — Assessment & Plan Note (Signed)
Anoro qd Depomedrol 80 mg im Zpac if worse Tessalon prn Prom-cod syr prn

## 2014-10-19 NOTE — Progress Notes (Signed)
Pre visit review using our clinic review tool, if applicable. No additional management support is needed unless otherwise documented below in the visit note. 

## 2014-10-19 NOTE — Assessment & Plan Note (Signed)
Continue with current prescription therapy as reflected on the Med list.  

## 2014-10-19 NOTE — Progress Notes (Signed)
Subjective:    HPI  C/o cough, worse w/exercising-running x1 mo C/o ST   Richard Ellis is exercising a lot: running, weights The patient presents for a follow-up of  chronic hypothyroidism, chronic dyslipidemia, elev glucose controlled with medicines and diet.    BP Readings from Last 3 Encounters:  09/29/14 142/90  07/07/14 118/76  06/08/14 110/70   Wt Readings from Last 3 Encounters:  09/29/14 179 lb 8 oz (81.421 kg)  07/07/14 180 lb 8 oz (81.874 kg)  06/08/14 180 lb (81.647 kg)     Review of Systems  Constitutional: Negative for appetite change, fatigue and unexpected weight change.  HENT: Negative for congestion, nosebleeds, sneezing, sore throat and trouble swallowing.   Eyes: Negative for itching and visual disturbance.  Respiratory: Positive for cough and chest tightness. Negative for shortness of breath and wheezing.   Cardiovascular: Negative for chest pain, palpitations and leg swelling.  Gastrointestinal: Negative for nausea, diarrhea, blood in stool and abdominal distention.  Genitourinary: Negative for frequency and hematuria.  Musculoskeletal: Negative for myalgias, back pain, joint swelling, arthralgias, gait problem and neck pain.  Skin: Negative for color change, rash and wound.  Neurological: Negative for dizziness, tremors, speech difficulty and weakness.  Psychiatric/Behavioral: Negative for suicidal ideas, sleep disturbance, dysphoric mood and agitation. The patient is not nervous/anxious.        Objective:   Physical Exam  Constitutional: He is oriented to person, place, and time. He appears well-developed.  HENT:  Mouth/Throat: Oropharynx is clear and moist.  Eyes: Conjunctivae are normal. Pupils are equal, round, and reactive to light.  Neck: Normal range of motion. No JVD present. No thyromegaly present.  Cardiovascular: Normal rate, regular rhythm, normal heart sounds and intact distal pulses.  Exam reveals no gallop and no friction rub.   No  murmur heard. Pulmonary/Chest: Effort normal and breath sounds normal. No respiratory distress. He has no wheezes. He has no rales. He exhibits no tenderness.  Abdominal: Soft. Bowel sounds are normal. He exhibits no distension and no mass. There is no tenderness. There is no rebound and no guarding.  Genitourinary: Rectum normal. Guaiac negative stool. No penile tenderness.  Musculoskeletal: Normal range of motion. He exhibits no edema or tenderness.  Lymphadenopathy:    He has no cervical adenopathy.  Neurological: He is alert and oriented to person, place, and time. He has normal reflexes. No cranial nerve deficit. He exhibits normal muscle tone. Coordination normal.  Skin: Skin is warm and dry. No rash noted.  Psychiatric: He has a normal mood and affect. His behavior is normal. Judgment and thought content normal.  Eryth throat     Lab Results  Component Value Date   WBC 4.1 02/02/2014   HGB 12.9* 02/02/2014   HCT 37.9* 02/02/2014   PLT 157.0 02/02/2014   GLUCOSE 91 02/02/2014   CHOL 292* 08/10/2014   TRIG 197.0* 08/10/2014   HDL 49.00 08/10/2014   LDLDIRECT 168.2 08/06/2012   LDLCALC 204* 08/10/2014   ALT 21 08/10/2014   AST 27 08/10/2014   NA 138 02/02/2014   K 3.9 02/02/2014   CL 106 02/02/2014   CREATININE 1.1 02/02/2014   BUN 22 02/02/2014   CO2 26 02/02/2014   TSH 2.91 02/02/2014   PSA 3.82 02/02/2014   INR 1.00 07/02/2013   HGBA1C 5.8 02/02/2014   I personally provided Anoro inhaler use teaching. After the teaching patient was able to demonstrate it's use effectively. All questions were answered   Assessment & Plan:

## 2014-11-09 ENCOUNTER — Other Ambulatory Visit (INDEPENDENT_AMBULATORY_CARE_PROVIDER_SITE_OTHER): Payer: Federal, State, Local not specified - PPO | Admitting: *Deleted

## 2014-11-09 DIAGNOSIS — E785 Hyperlipidemia, unspecified: Secondary | ICD-10-CM

## 2014-11-09 LAB — LIPID PANEL
Cholesterol: 228 mg/dL — ABNORMAL HIGH (ref 0–200)
HDL: 59.9 mg/dL (ref >39.00)
LDL Cholesterol: 138 mg/dL — ABNORMAL HIGH (ref 0–99)
NonHDL: 168.1
Total CHOL/HDL Ratio: 4
Triglycerides: 152 mg/dL — ABNORMAL HIGH (ref 0.0–149.0)
VLDL: 30.4 mg/dL (ref 0.0–40.0)

## 2014-11-09 LAB — HEPATIC FUNCTION PANEL
ALT: 24 U/L (ref 0–53)
AST: 29 U/L (ref 0–37)
Albumin: 4.3 g/dL (ref 3.5–5.2)
Alkaline Phosphatase: 50 U/L (ref 39–117)
Bilirubin, Direct: 0.1 mg/dL (ref 0.0–0.3)
Total Bilirubin: 0.5 mg/dL (ref 0.2–1.2)
Total Protein: 6.8 g/dL (ref 6.0–8.3)

## 2014-11-09 NOTE — Addendum Note (Signed)
Addended by: Eulis Foster on: 11/09/2014 12:00 PM   Modules accepted: Orders

## 2014-11-11 ENCOUNTER — Ambulatory Visit (INDEPENDENT_AMBULATORY_CARE_PROVIDER_SITE_OTHER): Payer: Federal, State, Local not specified - PPO | Admitting: Pharmacist

## 2014-11-11 VITALS — Wt 181.0 lb

## 2014-11-11 DIAGNOSIS — E785 Hyperlipidemia, unspecified: Secondary | ICD-10-CM

## 2014-11-11 LAB — NMR LIPOPROFILE WITH LIPIDS
Cholesterol, Total: 230 mg/dL — ABNORMAL HIGH (ref 100–199)
HDL Particle Number: 35.9 umol/L (ref >=30.5)
HDL Size: 9.2 nm (ref >=9.2)
HDL-C: 64 mg/dL (ref >39)
LDL (calc): 136 mg/dL — ABNORMAL HIGH (ref 0–99)
LDL Particle Number: 2121 nmol/L — ABNORMAL HIGH (ref <1000)
LDL Size: 20.3 nm (ref >=20.8)
LP-IR Score: 25 (ref <=45)
Large HDL-P: 8.9 umol/L (ref >=4.8)
Large VLDL-P: <0.8 nmol/L (ref <=2.7)
Small LDL Particle Number: 1235 nmol/L — ABNORMAL HIGH (ref <=527)
Triglycerides: 152 mg/dL — ABNORMAL HIGH (ref 0–149)
VLDL Size: 43.9 nm (ref <=46.6)

## 2014-11-11 MED ORDER — EZETIMIBE 10 MG PO TABS: 10.0000 mg | ORAL_TABLET | Freq: Every day | ORAL | Status: DC

## 2014-11-11 NOTE — Progress Notes (Signed)
Richard Ellis is a 69 yo WM that was referred to the lipid clinic by Dr. Burt Knack.  He has a PMH significant for stroke related to PFO but otherwise no cardiac history.   Pt has tried several therapies in the past.  He failed atorvastatin and Crestor due to muscle pains and weakness developing even while taking CoQ10. The pt has an allergy to niacin due to his palms turning red and starting to itch. The pt was prescribed Livalo after failing Crestor, but states he never started the medication due to his previous problems with statins.  He states he is currently taking krill oil 1 capsule QPM, fish oil 1 gram QAM, and Zetia 10 mg daily. He is still taking CoQ10 200mg  once daily but plans to discontinue today. He states he has never tried fenofibrate.  FH: his father died of a heart attack at the age of 25; he uncle had a triple bypass and died before the age of 37; and his grandma died of a stroke at the age of 24.    SH: non-smoker, varying alcohol use (usually beer)  Diet:    Breakfast: oatmeal with pecans, banana, artificial sweetener, regular cheerios non-fat milk   Lunch: almond butter/jelly sandwich on cuban roll or whole wheat bread, peas or cabbage   Dinner: chili, peas, carrots, green beans, a lot of chicken, fish, bison (if beef)   Drinks half beet juice/half tart cherry juice drink daily for running endurance, 1.5 cups caffeinated coffee with powdered milk and artificial sweetener in the morning, water throughout the day  Exercise: Used to do weights, men's fitness class and spin class. Currently running 4-8 miles 4-5 times/week and races on the weekends. Plans to start biking 40 min twice/week and start back with weights soon.  RF: Family history, age  Goals: LDL <130 (optimally <100) Current Meds: Krill oil QD, Zetia 10mg  QD, fish oil 1g QD  Lipid Panel 11/09/14 (on Zetia, krill oil, fish oil):  TC 228mg /dL TG 152mg /dL HDL 60mg /dL LDL 138mg /dL NonHDL 168mg /dL  08/10/14 (on krill oil):   TC 292mg /dL TG 197mg /dL HDL 49mg /dL LDL 204mg /dL NonHDL 243mg /dL   Current Outpatient Prescriptions  Medication Sig Dispense Refill  . azithromycin (ZITHROMAX) 250 MG tablet As directed 6 tablet 0  . benzonatate (TESSALON) 200 MG capsule Take 1 capsule (200 mg total) by mouth 2 (two) times daily as needed for cough. 60 capsule 0  . Cholecalciferol 2000 UNITS CAPS Take 1 capsule by mouth daily.      . clopidogrel (PLAVIX) 75 MG tablet TAKE ONE TABLET BY MOUTH ONE TIME DAILY  90 tablet 3  . Coenzyme Q-10 200 MG CAPS Take 200 mg by mouth daily.    [name] ezetimibe (ZETIA) 10 MG tablet Take 1 tablet (10 mg total) by mouth daily. 90 tablet 0  . ibuprofen (ADVIL,MOTRIN) 200 MG tablet Take 400 mg by mouth every 6 (six) hours as needed (for pain).     [name] KRILL OIL PO Take by mouth daily.    [name] levothyroxine (SYNTHROID, LEVOTHROID) 25 MCG tablet TAKE ONE TABLET BY MOUTH ONE TIME DAILY  30 tablet 5  . Magnesium 250 MG TABS Take 250 mg by mouth daily.    [name] neomycin-polymyxin-hydrocortisone (CORTISPORIN) otic solution Place 3 drops into the left ear 4 (four) times daily. 10 mL 0  . promethazine-codeine (PHENERGAN WITH CODEINE) 6.25-10 MG/5ML syrup Take 5 mLs by mouth every 4 (four) hours as needed. 300 mL 0  . sildenafil (VIAGRA) 100 MG  tablet Take 1/4-1/2  tablet by mouth as needed    . Umeclidinium-Vilanterol (ANORO ELLIPTA) 62.5-25 MCG/INH AEPB Inhale 1 Act into the lungs daily. 1 each 3   No current facility-administered medications for this visit.   Allergies  Allergen Reactions  . Atorvastatin Other (See Comments)    Muscle weakness  . Crestor [Rosuvastatin]     Muscle weakness  . Demerol [Meperidine] Nausea Only  . Niacin And Related Itching and Other (See Comments)    Palms turned red and itched     A/P: Richard Ellis is close to his LDL goal of <130 with addition of Zetia 10 mg daily. We praised him on his very good diet and exercise regimen and he continued to talk about the importance of  these to him. We discussed good dietary habits including increasing fiber rich foods and lean proteins. He is going to continue taking Zetia, fish oil and krill oil daily and return in 6 months for follow up labs.   Ruta Hinds. Velva Harman, PharmD Clinical Pharmacist - Resident Pager: [phone] Pharmacy: [phone] 11/11/2014 10:44 AM

## 2014-11-11 NOTE — Patient Instructions (Signed)
It was a pleasure meeting with you today!  Continue with your current regimen of Zetia 10mg  daily, krill oil and fish oil. Keep up your good exercise and diet habits, including increasing fiber and lean proteins such as chicken, fish and [place].   We will see you back in 6 months for repeat labs and a follow up visit.

## 2014-11-15 ENCOUNTER — Other Ambulatory Visit: Payer: Self-pay

## 2014-11-15 ENCOUNTER — Encounter (HOSPITAL_COMMUNITY): Payer: Self-pay | Admitting: Cardiovascular Disease

## 2014-11-15 DIAGNOSIS — E785 Hyperlipidemia, unspecified: Secondary | ICD-10-CM

## 2014-11-15 MED ORDER — EZETIMIBE 10 MG PO TABS: 10.0000 mg | ORAL_TABLET | Freq: Every day | ORAL | Status: DC

## 2014-12-23 ENCOUNTER — Ambulatory Visit (INDEPENDENT_AMBULATORY_CARE_PROVIDER_SITE_OTHER): Payer: Federal, State, Local not specified - PPO | Admitting: Internal Medicine

## 2014-12-23 ENCOUNTER — Encounter: Payer: Self-pay | Admitting: Internal Medicine

## 2014-12-23 VITALS — BP 110/60 | HR 54 | Temp 98.4°F | Wt 179.0 lb

## 2014-12-23 DIAGNOSIS — H60392 Other infective otitis externa, left ear: Secondary | ICD-10-CM | POA: Diagnosis not present

## 2014-12-23 DIAGNOSIS — H60399 Other infective otitis externa, unspecified ear: Secondary | ICD-10-CM | POA: Insufficient documentation

## 2014-12-23 MED ORDER — CIPROFLOXACIN-HYDROCORTISONE 0.2-1 % OT SUSP: 3.0000 [drp] | Freq: Two times a day (BID) | OTIC | Status: DC

## 2014-12-23 NOTE — Assessment & Plan Note (Addendum)
4/16 L Cipro HCT gtt. Use OTC Boric acid ear drops ENT if not better

## 2014-12-23 NOTE — Progress Notes (Signed)
Pre visit review using our clinic review tool, if applicable. No additional management support is needed unless otherwise documented below in the visit note. 

## 2014-12-23 NOTE — Progress Notes (Signed)
Subjective:    Ear Fullness  There is pain in the right ear. The current episode started more than 1 month ago. The problem occurs constantly. There has been no fever. The patient is experiencing no pain. Associated symptoms include hearing loss. Pertinent negatives include no coughing, diarrhea, neck pain, rash or sore throat.     Richard Ellis is exercising a lot: running, weights The patient presents for a follow-up of  chronic hypothyroidism, chronic dyslipidemia, elev glucose controlled with medicines and diet.    BP Readings from Last 3 Encounters:  12/23/14 110/60  10/19/14 114/64  09/29/14 142/90   Wt Readings from Last 3 Encounters:  12/23/14 179 lb (81.194 kg)  11/11/14 181 lb (82.101 kg)  10/19/14 181 lb (82.101 kg)     Review of Systems  Constitutional: Negative for appetite change, fatigue and unexpected weight change.  HENT: Positive for hearing loss. Negative for congestion, nosebleeds, sneezing, sore throat and trouble swallowing.   Eyes: Negative for itching and visual disturbance.  Respiratory: Positive for chest tightness. Negative for cough, shortness of breath and wheezing.   Cardiovascular: Negative for chest pain, palpitations and leg swelling.  Gastrointestinal: Negative for nausea, diarrhea, blood in stool and abdominal distention.  Genitourinary: Negative for frequency and hematuria.  Musculoskeletal: Negative for myalgias, back pain, joint swelling, arthralgias, gait problem and neck pain.  Skin: Negative for color change, rash and wound.  Neurological: Negative for dizziness, tremors, speech difficulty and weakness.  Psychiatric/Behavioral: Negative for suicidal ideas, sleep disturbance, dysphoric mood and agitation. The patient is not nervous/anxious.        Objective:   Physical Exam  Constitutional: He is oriented to person, place, and time. He appears well-developed.  HENT:  Mouth/Throat: Oropharynx is clear and moist.  Eyes: Conjunctivae are  normal. Pupils are equal, round, and reactive to light.  Neck: Normal range of motion. No JVD present. No thyromegaly present.  Cardiovascular: Normal rate, regular rhythm, normal heart sounds and intact distal pulses.  Exam reveals no gallop and no friction rub.   No murmur heard. Pulmonary/Chest: Effort normal and breath sounds normal. No respiratory distress. He has no wheezes. He has no rales. He exhibits no tenderness.  Abdominal: Soft. Bowel sounds are normal. He exhibits no distension and no mass. There is no tenderness. There is no rebound and no guarding.  Genitourinary: Rectum normal. Guaiac negative stool. No penile tenderness.  Musculoskeletal: Normal range of motion. He exhibits no edema or tenderness.  Lymphadenopathy:    He has no cervical adenopathy.  Neurological: He is alert and oriented to person, place, and time. He has normal reflexes. No cranial nerve deficit. He exhibits normal muscle tone. Coordination normal.  Skin: Skin is warm and dry. No rash noted.  Psychiatric: He has a normal mood and affect. His behavior is normal. Judgment and thought content normal.  L ear canal w/white exsudate next to the TM     Lab Results  Component Value Date   WBC 4.1 02/02/2014   HGB 12.9* 02/02/2014   HCT 37.9* 02/02/2014   PLT 157.0 02/02/2014   GLUCOSE 91 02/02/2014   CHOL 230* 11/09/2014   TRIG 152* 11/09/2014   HDL 64 11/09/2014   LDLDIRECT 168.2 08/06/2012   LDLCALC 136* 11/09/2014   ALT 24 11/09/2014   AST 29 11/09/2014   NA 138 02/02/2014   K 3.9 02/02/2014   CL 106 02/02/2014   CREATININE 1.1 02/02/2014   BUN 22 02/02/2014   CO2 26 02/02/2014  TSH 2.91 02/02/2014   PSA 3.82 02/02/2014   INR 1.00 07/02/2013   HGBA1C 5.8 02/02/2014      Assessment & Plan:

## 2015-03-03 ENCOUNTER — Other Ambulatory Visit: Payer: Self-pay | Admitting: Internal Medicine

## 2015-03-14 ENCOUNTER — Other Ambulatory Visit: Payer: Self-pay

## 2015-05-09 ENCOUNTER — Other Ambulatory Visit (INDEPENDENT_AMBULATORY_CARE_PROVIDER_SITE_OTHER): Payer: Federal, State, Local not specified - PPO

## 2015-05-09 DIAGNOSIS — E785 Hyperlipidemia, unspecified: Secondary | ICD-10-CM

## 2015-05-09 LAB — LIPID PANEL
Cholesterol: 243 mg/dL — ABNORMAL HIGH (ref 0–200)
HDL: 47 mg/dL (ref >39.00)
NonHDL: 195.75
Total CHOL/HDL Ratio: 5
Triglycerides: 213 mg/dL — ABNORMAL HIGH (ref 0.0–149.0)
VLDL: 42.6 mg/dL — ABNORMAL HIGH (ref 0.0–40.0)

## 2015-05-09 LAB — HEPATIC FUNCTION PANEL
ALT: 20 U/L (ref 0–53)
AST: 27 U/L (ref 0–37)
Albumin: 4.1 g/dL (ref 3.5–5.2)
Alkaline Phosphatase: 43 U/L (ref 39–117)
Bilirubin, Direct: 0.1 mg/dL (ref 0.0–0.3)
Total Bilirubin: 0.5 mg/dL (ref 0.2–1.2)
Total Protein: 6.5 g/dL (ref 6.0–8.3)

## 2015-05-09 LAB — LDL CHOLESTEROL, DIRECT: Direct LDL: 122 mg/dL

## 2015-05-12 ENCOUNTER — Ambulatory Visit (INDEPENDENT_AMBULATORY_CARE_PROVIDER_SITE_OTHER): Payer: Federal, State, Local not specified - PPO | Admitting: Pharmacist

## 2015-05-12 DIAGNOSIS — E785 Hyperlipidemia, unspecified: Secondary | ICD-10-CM

## 2015-05-12 NOTE — Patient Instructions (Signed)
Gradually increase your dose of fish oil to 2 grams twice a day as you stop taking your krill oil Continue taking Zetia 10mg  daily Continue with your daily exercise and dietary habits We will check labs in 6 months on Tuesday, February 21 (lab opens at 7:30, come in any time after for fasting lipid panel)

## 2015-05-12 NOTE — Progress Notes (Signed)
Patient ID: Richard Ellis           DOB: Jan 18, 2046, 69 yo                MRN: [id number]     HPI: Richard Ellis is a 69 y.o. male patient who presents to lipid clinic today with his wife for f/u. He was originally referred to clinic by Dr. Burt Knack and has a PMH significant for stroke related to PFO, without other cardiac history.  Patient reports that he has tried several therapies in the past. He has failed Lipitor 20mg  daily and Crestor (multiple doses including 5mg  TIW) due to muscle pains and weakness that developed even while taking CoQ10. He has an allergy to niacin due ot his palms turning red and itching. He was prescribed Livalo after failing Crestor, but never started taking it due to his previous problems with statins.   Current Medications: krill oil once daily, fish oil 1g daily, Zetia 10mg  daily, CoQ10 200mg  daily Intolerances: Lipitor 20mg  daily, Crestor 5mg  TIW - myalgias and weakness Risk Factors: family history, sex, age LDL goal: <130   Diet: Eats most meals at home with his wife. Reports that they have been trying to eat more lean meat and fewer carbohydrates.   Breakfast: oatmeal with pecans, banana, artificial sweetener, regular cheerios non-fat milk  Lunch: almond butter/jelly sandwich on cuban roll or whole wheat bread, peas or cabbage  Dinner: chili, peas, carrots, green beans, a lot of chicken, fish, bison (if beef)  Drinks half beet juice/half tart cherry juice drink daily for running endurance, 1.5 cups caffeinated coffee with powdered milk and artificial sweetener in the morning, water throughout the day  Exercise: Used to do weights, men's fitness class and spin class. Currently running 4-8 miles 4-5 times/week and races on the weekends. Plans to start biking 40 min twice/week and start back with weights soon.  Family History: Father died of a heart attack at the age of 24; he uncle had a triple bypass and died before the age of 92; and his grandma died of a stroke  at the age of 25.   Social History: Non-smoker, varying alcohol use (usually beer)  Labs: 04/2015: TC 243, TG 213, HDL 47, LDL 122 (on Zetia, krill oil, fish oil) reports that he ate more sugary foods and drank more beer in the week leading up to panel 10/2014: TC 228, TG 152, HDL 60, LDL 138 (on Zetia, krill oil, fish oil) reports that he cut back on sugar and beer the month before this panel 08/10/14: TC 292, TG 197, HDL 49, LDL 204 (on krill oil)  Current Outpatient Prescriptions on File Prior to Visit  Medication Sig Dispense Refill  . Cholecalciferol 2000 UNITS CAPS Take 1 capsule by mouth daily.      . clopidogrel (PLAVIX) 75 MG tablet TAKE ONE TABLET BY MOUTH ONE TIME DAILY  90 tablet 3  . Coenzyme Q-10 200 MG CAPS Take 200 mg by mouth daily.    [name] ezetimibe (ZETIA) 10 MG tablet Take 1 tablet (10 mg total) by mouth daily. 90 tablet 3  . ibuprofen (ADVIL,MOTRIN) 200 MG tablet Take 400 mg by mouth every 6 (six) hours as needed (for pain).     [name] KRILL OIL PO Take by mouth daily.    [name] levothyroxine (SYNTHROID, LEVOTHROID) 25 MCG tablet TAKE ONE TABLET BY MOUTH ONE TIME DAILY 30 tablet 11  . Magnesium 250 MG TABS Take 250 mg by mouth daily.    [name]  sildenafil (VIAGRA) 100 MG tablet Take 1/4-1/2  tablet by mouth as needed    . Umeclidinium-Vilanterol (ANORO ELLIPTA) 62.5-25 MCG/INH AEPB Inhale 1 Act into the lungs daily. 1 each 3   No current facility-administered medications on file prior to visit.    Allergies  Allergen Reactions  . Atorvastatin Other (See Comments)    Muscle weakness  . Crestor [Rosuvastatin]     Muscle weakness  . Demerol [Meperidine] Nausea Only  . Niacin And Related Itching and Other (See Comments)    Palms turned red and itched    Assessment/Plan: 1. Hyperlipidemia - Patient with LDL currently at goal < 130 on Zetia 10mg  daily, fish oil 1g, and krill oil. Could aim for LDL < 100 given patient's family, however other options to lower LDL are very limited. Pt  has no cardiac disease, he is intolerant to Crestor 5mg  TIW, and is already taking Zetia, and would not qualify for PCSK9i therapy. Patient exercises 6 days a week and runs in races. Discussed diet in depth with him and his wife - they will work to try to limit carbohydrates and increase lean proteins in their diet. Patient's TG have risen since last visit from 152 to 213, although patient reports that he had more sugary foods and beer prior to his most recent lipid panel. Will have patient stop taking krill oil and increase his fish oil to help reduce TG. Discussed that 2 to 4g of fish oil are usually required to see lowering of TG, patient will work up to taking 2g BID as tolerated. Also discussed that high blood sugars correlate with elevated TG - patient has had A1c's in the pre-diabetic range for the past 5 years or so. Patient reports that no one had ever mentioned this to him before. Doubt insulin resistance given patient's active lifestyle and healthy weight - recommended that he follow up with his PCP for annual glucose/A1c reading to keep an eye on his blood sugar. Will f/u with lipid panel in 6 months.    Hoda Hon E. Delance Weide, PharmD [facility] [address], [place],  [address] Phone: [phone]; Fax: [phone] 05/12/2015 12:44 PM

## 2015-07-26 ENCOUNTER — Other Ambulatory Visit: Payer: Self-pay | Admitting: Internal Medicine

## 2015-09-06 ENCOUNTER — Ambulatory Visit (INDEPENDENT_AMBULATORY_CARE_PROVIDER_SITE_OTHER): Payer: Federal, State, Local not specified - PPO | Admitting: Family Medicine

## 2015-09-06 ENCOUNTER — Encounter: Payer: Self-pay | Admitting: Family Medicine

## 2015-09-06 ENCOUNTER — Ambulatory Visit (INDEPENDENT_AMBULATORY_CARE_PROVIDER_SITE_OTHER)
Admission: RE | Admit: 2015-09-06 | Discharge: 2015-09-06 | Disposition: A | Discharge status: Home / Self Care | Payer: Federal, State, Local not specified - PPO | Source: Ambulatory Visit | Attending: Family Medicine | Admitting: Family Medicine

## 2015-09-06 VITALS — BP 124/64 | HR 51 | Ht 73.5 in | Wt 168.0 lb

## 2015-09-06 DIAGNOSIS — M545 Low back pain, unspecified: Secondary | ICD-10-CM

## 2015-09-06 DIAGNOSIS — M5416 Radiculopathy, lumbar region: Secondary | ICD-10-CM

## 2015-09-06 DIAGNOSIS — M47816 Spondylosis without myelopathy or radiculopathy, lumbar region: Secondary | ICD-10-CM | POA: Insufficient documentation

## 2015-09-06 MED ORDER — GABAPENTIN 100 MG PO CAPS: 200.0000 mg | ORAL_CAPSULE | Freq: Every day | ORAL | Status: DC

## 2015-09-06 MED ORDER — PREDNISONE 50 MG PO TABS: 50.0000 mg | ORAL_TABLET | Freq: Every day | ORAL | Status: DC

## 2015-09-06 NOTE — Progress Notes (Signed)
Pre visit review using our clinic review tool, if applicable. No additional management support is needed unless otherwise documented below in the visit note. 

## 2015-09-06 NOTE — Progress Notes (Signed)
[facility] [place], [place] [address] Phone: [phone] Subjective:       CC: Hip pain left followup  [id number] Richard Ellis is a 69 y.o. male coming in for followup of hip pain. 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 Patient had injection greater than a year and half ago. Patient responded well to conservative therapy. No injection was necessary. Patient states seems to be somewhat different. Patient states that it seems to be giving him some radiation down the leg. Patient has actually had to stop running. Patient usually is an avid runner. Patient describes the pain as a dull, throbbing aching sensation. Still on the side of the hip but once again more down the leg. Mild numbness associated with it. Rates the severity of pain as 2 out of 10 now but can be as bad is 8 out of 10. Patient has been taking 1 ibuprofen twice daily. States that this is somewhat helpful. Denies any fevers chills or any abnormal weight loss.  Past Medical History  Diagnosis Date  . Hyperlipidemia   . Allergy     rhinitis  . Bronchitis   . Hypothyroidism   . Heart murmur     "as a child" (07/02/2013)  . Chronic bronchitis ([place])     "q year" (07/02/2013)  . Anemia     "Hgb always on the low side" (07/02/2013)  . Esophageal stricture   . Stroke ([place]) 07/02/2013    "small ischemic occipital right sided" (07/02/2013)  . Osteoarthritis     "maybe a little bit in my spine" (07/02/2013)  . PFO (patent foramen ovale)     with right to left shunting   Past Surgical History  Procedure Laterality Date  . Esophagogastroduodenoscopy N/A 10/26/2012    Procedure: ESOPHAGOGASTRODUODENOSCOPY (EGD);  Surgeon: Winfield Cunas., MD;  Location: [facility] ENDOSCOPY;  Service: Endoscopy;  Laterality: N/A;  . Tonsillectomy  1950's  . Esophagogastroduodenoscopy N/A 07/08/2013    Procedure: ESOPHAGOGASTRODUODENOSCOPY (EGD);  Surgeon: Winfield Cunas., MD;  Location: [facility] ENDOSCOPY;  Service: Endoscopy;  Laterality: N/A;  . Savory dilation N/A 07/08/2013    Procedure: SAVORY DILATION;  Surgeon: Winfield Cunas., MD;  Location: [facility] ENDOSCOPY;  Service: Endoscopy;  Laterality: N/A;  . Tee without cardioversion N/A 07/28/2013    Procedure: TRANSESOPHAGEAL ECHOCARDIOGRAM (TEE);  Surgeon: Pixie Casino, MD;  Location: [facility] ENDOSCOPY;  Service: Cardiovascular;  Laterality: N/A;   Social History  Substance Use Topics  . Smoking status: Never Smoker   . Smokeless tobacco: Never Used  . Alcohol Use: 4.8 oz/week    4 Cans of beer, 4 Glasses of wine per week     Comment: 07/02/2013 "glass of wine or a beer most nights of the week"   Allergies  Allergen Reactions  . Atorvastatin Other (See Comments)    Muscle weakness  . Crestor [Rosuvastatin]     Muscle weakness  . Demerol [Meperidine] Nausea Only  . Niacin And Related Itching and Other (See Comments)    Palms turned red and itched   Family History  Problem Relation Age of Onset  . Coronary artery disease Other     male first degree relative <60  . Heart disease Father     heart attack  . Colon cancer Neg Hx           Past medical history, social, surgical and family history all reviewed in electronic medical record.  Review of Systems: No headache, visual changes, nausea, vomiting, diarrhea, constipation, dizziness, abdominal pain, skin rash, fevers, chills, night sweats, weight loss, swollen lymph nodes, body aches, joint swelling, muscle aches, chest pain, shortness of breath, mood changes.   Objective Blood pressure 124/64, pulse 51, height 6' 1.5" (1.867 m), weight 168 lb (76.204 kg), SpO2 98 %.  General: No apparent distress alert and oriented x3 mood and affect normal, dressed appropriately.  HEENT: Pupils equal, extraocular movements intact  Respiratory: Patient's speak in full sentences and does not appear short of breath  Cardiovascular: No lower extremity  edema, non tender, no erythema  Skin: Warm dry intact with no signs of infection or rash on extremities or on axial skeleton.  Abdomen: Soft nontender  Neuro: Cranial nerves II through XII are intact, neurovascularly intact in all extremities with 2+ DTRs and 2+ pulses.  Lymph: No lymphadenopathy of posterior or anterior cervical chain or axillae bilaterally.  Gait normal with good balance and coordination.  MSK:  Non tender with full range of motion and good stability and symmetric strength and tone of shoulders, elbows, wrist,  knee and ankles bilaterally.  Hip: Left ROM IR: 40 Deg, ER: 45 Deg, Flexion: 120 Deg, Extension: 100 Deg, Abduction: 45 Deg, Adduction: 45 Deg Strength IR: 5/5, ER: 5/5, Flexion: 5/5, Extension: 5/5, Abduction: 4/5, Adduction: 5/5 Pelvic alignment unremarkable to inspection and palpation. Standing hip rotation and gait without trendelenburg sign / unsteadiness. Greater trochanter without tenderness to palpation. No tenderness over piriformis and greater trochanter. No pain with FABER or FADIR. No SI joint tenderness and normal minimal SI movement. Positive straight leg test. With radiculopathy on the left side. Positive crossover test as well.   Procedure note [id number]; 15 minutes spent for Therapeutic exercises as stated in above notes.  This included exercises focusing on stretching, strengthening, with significant focus on eccentric aspects.  Pelvic tilt/bracing instruction to focus on control of the pelvic girdle and lower abdominal muscles  Glute strengthening exercises, focusing on proper firing of the glutes without engaging the low back muscles Proper stretching techniques for maximum relief for the hamstrings, hip flexors, low back and some rotation where tolerated Proper technique shown and discussed handout in great detail with ATC.  All questions were discussed and answered.      Impression and Recommendations:     This case required medical decision  making of moderate complexity.

## 2015-09-06 NOTE — Assessment & Plan Note (Signed)
I believe the patient has more of an L5-S1 nerve root compression. X-rays ordered today. Prednisone and gabapentin as well as home exercises given. We discussed icing regimen. Patient will remain active but we discussed trying to avoid any high impact exercises until patient is seen again in 7-10 days. At that time we will get him an exercise prescription and start getting him back to running.

## 2015-09-06 NOTE — Patient Instructions (Addendum)
Great to see you again  Xray downstairs today Exercises to help when having on pain.  Ice 20 minutes 2 times daily. Usually after activity and before bed. Prednisone daily for 5 days Gabapentin 100mg  at night for next 3 nights then 200mg  thereafter. Drop weight 50%  Only elliptical and biking with elevated handle bars See me again in 7-10 days.  Happy holidays!

## 2015-09-09 ENCOUNTER — Ambulatory Visit
Admission: RE | Admit: 2015-09-09 | Discharge: 2015-09-09 | Disposition: A | Discharge status: Home / Self Care | Payer: Federal, State, Local not specified - PPO | Source: Ambulatory Visit | Attending: Allergy and Immunology | Admitting: Allergy and Immunology

## 2015-09-09 ENCOUNTER — Other Ambulatory Visit: Payer: Self-pay | Admitting: Allergy and Immunology

## 2015-09-09 DIAGNOSIS — R05 Cough: Secondary | ICD-10-CM

## 2015-09-09 DIAGNOSIS — R059 Cough, unspecified: Secondary | ICD-10-CM

## 2015-09-15 ENCOUNTER — Ambulatory Visit (INDEPENDENT_AMBULATORY_CARE_PROVIDER_SITE_OTHER): Payer: Federal, State, Local not specified - PPO | Admitting: Family Medicine

## 2015-09-15 ENCOUNTER — Encounter: Payer: Self-pay | Admitting: Cardiovascular Disease

## 2015-09-15 ENCOUNTER — Encounter: Payer: Self-pay | Admitting: Family Medicine

## 2015-09-15 ENCOUNTER — Ambulatory Visit (INDEPENDENT_AMBULATORY_CARE_PROVIDER_SITE_OTHER): Payer: Federal, State, Local not specified - PPO | Admitting: Cardiovascular Disease

## 2015-09-15 VITALS — BP 122/70 | HR 56 | Ht 73.5 in | Wt 183.8 lb

## 2015-09-15 VITALS — BP 118/62 | HR 52 | Ht 73.5 in | Wt 185.0 lb

## 2015-09-15 DIAGNOSIS — E785 Hyperlipidemia, unspecified: Secondary | ICD-10-CM

## 2015-09-15 DIAGNOSIS — M5416 Radiculopathy, lumbar region: Secondary | ICD-10-CM | POA: Diagnosis not present

## 2015-09-15 DIAGNOSIS — Q2112 Patent foramen ovale: Secondary | ICD-10-CM

## 2015-09-15 DIAGNOSIS — Q211 Atrial septal defect: Secondary | ICD-10-CM | POA: Diagnosis not present

## 2015-09-15 MED ORDER — GABAPENTIN 300 MG PO CAPS: ORAL_CAPSULE | ORAL | Status: DC

## 2015-09-15 MED ORDER — ASPIRIN EC 81 MG PO TBEC: 81.0000 mg | DELAYED_RELEASE_TABLET | Freq: Every day | ORAL | Status: DC

## 2015-09-15 MED ORDER — MELOXICAM 15 MG PO TABS: 15.0000 mg | ORAL_TABLET | Freq: Every day | ORAL | Status: DC

## 2015-09-15 NOTE — Patient Instructions (Addendum)
Good to see you Gabapenitn 300mg  at night now and sent in new prescription Meloxicam daily for 10 days then as needed Do a little more elliptical or lower impact for next 2 weeks Ice still after activity See me again in 3 weeks just to make sure we are good or call me sooner if worsening numbness or weakness Happy New Year!

## 2015-09-15 NOTE — Patient Instructions (Signed)
Medication Instructions:  Your physician has recommended you make the following change in your medication:  1. STOP Plavix (clopidogrel) 2. START Aspirin 81mg  take one tablet by mouth daily  Labwork: Your physician recommends that you return for a FASTING lipid profile in AUGUST 2017--nothing to eat or drink after midnight, lab opens at 7:30 AM  Testing/Procedures: No new orders.   Follow-Up: Your physician wants you to follow-up in: 1 YEAR with Dr Burt Knack.  You will receive a reminder letter in the mail two months in advance. If you don't receive a letter, please call our office to schedule the follow-up appointment.   Any Other Special Instructions Will Be Listed Below (If Applicable).     If you need a refill on your cardiac medications before your next appointment, please call your pharmacy.

## 2015-09-15 NOTE — Assessment & Plan Note (Signed)
Discussed with patient. Patient states subjectively that the radicular symptom seems to be improved but patient continues to have a positive straight leg test. No signs of decreasing in deep tendon reflexes in does have full strength the lower extremity. We discussed the possibility of advance imaging but at this point patient will like to try conservative therapy. We are going to do oral anti-inflammatories and these were prescribed. Patient also prescribed a higher dose of the gabapentin with him having no side effects. We discussed continuing the range of motion and avoiding any type of high impact exercising including running at this moment. Patient will come back and see me again in 2-3 weeks. Depending on results we will decide if advanced imaging is warranted. Patient otherwise can call if any severe symptoms occur and we did discuss this in great detail with patient.

## 2015-09-15 NOTE — Progress Notes (Signed)
Pre visit review using our clinic review tool, if applicable. No additional management support is needed unless otherwise documented below in the visit note. 

## 2015-09-15 NOTE — Progress Notes (Signed)
[facility] [place], [place] [address] Phone: [phone] Subjective:       CC: neck pain follow-up  [id number] Richard Ellis is a 69 y.o. male coming in for followup of hip/bacl pain. Patient presentation less than was more concerning for a lumbar radiculopathy. Patient will start on gabapentin, prednisone, and home exercises. Patient has been doing this now for a little over a week. States that the pain is not significantly better but the radicular symptoms down the leg has improved. Patient has felt better than he was able to do more activities and even attempted running. This was AGAINST MEDICAL ADVICE. Patient states though that they did give him some discomfort. Continue to do the stretching fairly regularly. Denies any weakness of the large area. Denies any new symptoms. Does not know if he would consider him some better or not.  Past Medical History  Diagnosis Date  . Hyperlipidemia   . Allergy     rhinitis  . Bronchitis   . Hypothyroidism   . Heart murmur     "as a child" (07/02/2013)  . Chronic bronchitis ([place])     "q year" (07/02/2013)  . Anemia     "Hgb always on the low side" (07/02/2013)  . Esophageal stricture   . Stroke ([place]) 07/02/2013    "small ischemic occipital right sided" (07/02/2013)  . Osteoarthritis     "maybe a little bit in my spine" (07/02/2013)  . PFO (patent foramen ovale)     with right to left shunting   Past Surgical History  Procedure Laterality Date  . Esophagogastroduodenoscopy N/A 10/26/2012    Procedure: ESOPHAGOGASTRODUODENOSCOPY (EGD);  Surgeon: Winfield Cunas., MD;  Location: [facility] ENDOSCOPY;  Service: Endoscopy;  Laterality: N/A;  . Tonsillectomy  1950's  . Esophagogastroduodenoscopy N/A 07/08/2013    Procedure: ESOPHAGOGASTRODUODENOSCOPY (EGD);  Surgeon: Winfield Cunas., MD;  Location: [facility] ENDOSCOPY;  Service: Endoscopy;  Laterality: N/A;  . Savory dilation N/A 07/08/2013    Procedure:  SAVORY DILATION;  Surgeon: Winfield Cunas., MD;  Location: [facility] ENDOSCOPY;  Service: Endoscopy;  Laterality: N/A;  . Tee without cardioversion N/A 07/28/2013    Procedure: TRANSESOPHAGEAL ECHOCARDIOGRAM (TEE);  Surgeon: Pixie Casino, MD;  Location: [facility] ENDOSCOPY;  Service: Cardiovascular;  Laterality: N/A;   Social History  Substance Use Topics  . Smoking status: Never Smoker   . Smokeless tobacco: Never Used  . Alcohol Use: 4.8 oz/week    4 Cans of beer, 4 Glasses of wine per week     Comment: 07/02/2013 "glass of wine or a beer most nights of the week"   Allergies  Allergen Reactions  . Atorvastatin Other (See Comments)    Muscle weakness  . Crestor [Rosuvastatin]     Muscle weakness  . Demerol [Meperidine] Nausea Only  . Niacin And Related Itching and Other (See Comments)    Palms turned red and itched   Family History  Problem Relation Age of Onset  . Coronary artery disease Other     male first degree relative <60  . Heart disease Father     heart attack  . Colon cancer Neg Hx           Past medical history, social, surgical and family history all reviewed in electronic medical record.   Review of Systems: No headache, visual changes, nausea, vomiting, diarrhea, constipation, dizziness, abdominal pain, skin rash, fevers, chills, night sweats, weight loss, swollen lymph nodes, body  aches, joint swelling, muscle aches, chest pain, shortness of breath, mood changes.   Objective Blood pressure 118/62, pulse 52, height 6' 1.5" (1.867 m), weight 185 lb (83.915 kg), SpO2 97 %.  General: No apparent distress alert and oriented x3 mood and affect normal, dressed appropriately.  HEENT: Pupils equal, extraocular movements intact  Respiratory: Patient's speak in full sentences and does not appear short of breath  Cardiovascular: No lower extremity edema, non tender, no erythema  Skin: Warm dry intact with no signs of infection or rash on extremities or on axial skeleton.   Abdomen: Soft nontender  Neuro: Cranial nerves II through XII are intact, neurovascularly intact in all extremities with 2+ DTRs and 2+ pulses.  Lymph: No lymphadenopathy of posterior or anterior cervical chain or axillae bilaterally.  Gait normal with good balance and coordination.  MSK:  Non tender with full range of motion and good stability and symmetric strength and tone of shoulders, elbows, wrist,  knee and ankles bilaterally.  Hip: Left ROM IR: 40 Deg, ER: 45 Deg, Flexion: 120 Deg, Extension: 100 Deg, Abduction: 45 Deg, Adduction: 45 Deg Strength IR: 5/5, ER: 5/5, Flexion: 5/5, Extension: 5/5, Abduction: 4/5, Adduction: 5/5 Pelvic alignment unremarkable to inspection and palpation. Standing hip rotation and gait without trendelenburg sign / unsteadiness. Greater trochanter without tenderness to palpation. No tenderness over piriformis and greater trochanter. No pain with FABER or FADIR. No SI joint tenderness and normal minimal SI movement. Continued positive straight leg test with radicular symptoms. Minimal discomfort to palpation in the paraspinal musculature of the lumbar spine on the left side       Impression and Recommendations:     This case required medical decision making of moderate complexity.

## 2015-09-15 NOTE — Progress Notes (Signed)
Cardiology Office Note Date:  09/15/2015   ID:  Richard Ellis, Richard Ellis [birth year], MRN [id number]  PCP:  Richard Kehr, MD  Cardiologist:  Richard Mocha, MD    Chief Complaint  Patient presents with  . chronic  sinus bradycadia    denies cp/sob/LE edema    History of Present Illness: Richard Ellis is a 69 y.o. male who presents for follow-up of PFO and stroke. He presented in 2014 with a cryptogenic stroke and was found to have a PFO with right-to-left shunt. MRI at that time showed a small right occipital lobe cortical infarct. At that time we elected to treat him medically with clopidogrel. He also has hyperlipidemia and has been statin intolerant. He's been followed in the lipid clinic and he has been treated with Zetia, fish oil, and Krill oil. He has now discontinued Krill oil.   The patient is doing well from a cardiac perspective. He continues to maintain an active lifestyle. He runs for exercise, but recently has been limited by back problems. He has been a competitive runner for many years. His main complaint is easy bruising. This is been a problem ever since he has taken Plavix. He denies chest pain, shortness of breath, or any focal neurologic complaints. He's had no recurrent stroke or TIA symptoms.   Past Medical History  Diagnosis Date  . Hyperlipidemia   . Allergy     rhinitis  . Bronchitis   . Hypothyroidism   . Heart murmur     "as a child" (07/02/2013)  . Chronic bronchitis ([place])     "q year" (07/02/2013)  . Anemia     "Hgb always on the low side" (07/02/2013)  . Esophageal stricture   . Stroke ([place]) 07/02/2013    "small ischemic occipital right sided" (07/02/2013)  . Osteoarthritis     "maybe a little bit in my spine" (07/02/2013)  . PFO (patent foramen ovale)     with right to left shunting    Past Surgical History  Procedure Laterality Date  . Esophagogastroduodenoscopy N/A 10/26/2012    Procedure: ESOPHAGOGASTRODUODENOSCOPY (EGD);  Surgeon: Richard Ellis., MD;  Location: [facility] ENDOSCOPY;  Service: Endoscopy;  Laterality: N/A;  . Tonsillectomy  1950's  . Esophagogastroduodenoscopy N/A 07/08/2013    Procedure: ESOPHAGOGASTRODUODENOSCOPY (EGD);  Surgeon: Richard Ellis., MD;  Location: [facility] ENDOSCOPY;  Service: Endoscopy;  Laterality: N/A;  . Savory dilation N/A 07/08/2013    Procedure: SAVORY DILATION;  Surgeon: Richard Ellis., MD;  Location: [facility] ENDOSCOPY;  Service: Endoscopy;  Laterality: N/A;  . Tee without cardioversion N/A 07/28/2013    Procedure: TRANSESOPHAGEAL ECHOCARDIOGRAM (TEE);  Surgeon: Richard Casino, MD;  Location: [facility] ENDOSCOPY;  Service: Cardiovascular;  Laterality: N/A;    Current Outpatient Prescriptions  Medication Sig Dispense Refill  . BREO ELLIPTA 200-25 MCG/INH AEPB Inhale 1 puff into the lungs daily.    . Cholecalciferol 2000 UNITS CAPS Take 1 capsule by mouth daily.      . Coenzyme Q-10 200 MG CAPS Take 200 mg by mouth daily.    [name] DYMISTA 137-50 MCG/ACT SUSP Place 1 spray into both nostrils 2 (two) times daily.  5  . ezetimibe (ZETIA) 10 MG tablet Take 1 tablet (10 mg total) by mouth daily. 90 tablet 3  . ibuprofen (ADVIL,MOTRIN) 200 MG tablet Take 400 mg by mouth every 6 (six) hours as needed (for pain).     [name] levothyroxine (SYNTHROID, LEVOTHROID) 25 MCG tablet TAKE ONE TABLET  BY MOUTH ONE TIME DAILY 30 tablet 11  . Magnesium 250 MG TABS Take 250 mg by mouth daily.    . Omega-3 Fatty Acids (FISH OIL) 1000 MG CAPS Take 2,000 mg by mouth 2 (two) times daily.    . predniSONE (DELTASONE) 50 MG tablet Take 1 tablet (50 mg total) by mouth daily. 5 tablet 0  . PROAIR RESPICLICK 123XX123 (90 Base) MCG/ACT AEPB Inhale 2 puffs into the lungs every 4 (four) hours as needed. Wheezing or sob  0  . sildenafil (VIAGRA) 100 MG tablet Take 1/4-1/2  tablet by mouth as needed    . Umeclidinium-Vilanterol (ANORO ELLIPTA) 62.5-25 MCG/INH AEPB Inhale 1 Act into the lungs daily. 1 each 3  . aspirin EC 81 MG tablet Take 1 tablet  (81 mg total) by mouth daily.    [name] gabapentin (NEURONTIN) 300 MG capsule nightly 30 capsule 3  . meloxicam (MOBIC) 15 MG tablet Take 1 tablet (15 mg total) by mouth daily. 30 tablet 0   No current facility-administered medications for this visit.    Allergies:   Atorvastatin; Crestor; Demerol; and Niacin and related   Social History:  The patient  reports that he has never smoked. He has never used smokeless tobacco. He reports that he drinks about 4.8 oz of alcohol per week. He reports that he does not use illicit drugs.   Family History:  The patient's  family history includes Coronary artery disease in his other; Heart disease in his father. There is no history of Colon cancer.    ROS:  Please see the history of present illness.  Otherwise, review of systems is positive for cough and back pain.  All other systems are reviewed and negative.    PHYSICAL EXAM: VS:  BP 122/70 mmHg  Pulse 56  Ht 6' 1.5" (1.867 m)  Wt 183 lb 12.8 oz (83.371 kg)  BMI 23.92 kg/m2 , BMI Body mass index is 23.92 kg/(m^2). GEN: Well nourished, well developed, in no acute distress HEENT: normal Neck: no JVD, no masses. No carotid bruits Cardiac: RRR without murmur or gallop                Respiratory:  clear to auscultation bilaterally, normal work of breathing GI: soft, nontender, nondistended, + BS MS: no deformity or atrophy Ext: no pretibial edema, pedal pulses 2+= bilaterally Skin: warm and dry, no rash Neuro:  Strength and sensation are intact Psych: euthymic mood, full affect  EKG:  EKG is ordered today. The ekg ordered today shows sinus bradycardia 57 bpm, possible age-indeterminate inferior MI no change from previous tracing 05/10/2014  Recent Labs: 05/09/2015: ALT 20   Lipid Panel     Component Value Date/Time   CHOL 243* 05/09/2015 1006   CHOL 230* 11/09/2014 1200   TRIG 213.0* 05/09/2015 1006   TRIG 152* 11/09/2014 1200   TRIG 228* 07/30/2006 0741   HDL 47.00 05/09/2015 1006   HDL  64 11/09/2014 1200   CHOLHDL 5 05/09/2015 1006   CHOLHDL 5.5 CALC 07/30/2006 0741   VLDL 42.6* 05/09/2015 1006   LDLCALC 136* 11/09/2014 1200   LDLCALC 138* 11/09/2014 0808   LDLDIRECT 122.0 05/09/2015 1006   LDLDIRECT 120.3 07/30/2006 0741      Wt Readings from Last 3 Encounters:  09/15/15 185 lb (83.915 kg)  09/15/15 183 lb 12.8 oz (83.371 kg)  09/06/15 168 lb (76.204 kg)     Cardiac Studies Reviewed: TEE 07/28/2013: Study Conclusions  - Left ventricle: There was mild  concentric hypertrophy. Systolic function was normal. The estimated ejection fraction was in the range of 60% to 65%. - Aortic valve: Structurally normal valve. - Mitral valve: Structurally normal valve. - Left atrium: The atrium was normal in size. No evidence of thrombus in the atrial cavity or appendage. - Right atrium: No evidence of thrombus in the atrial cavity or appendage. - Atrial septum: Small PFO with right to left shunting by color doppler. Saline microbubbles are seen shunting from right to left, especially with valsalva. - Tricuspid valve: Trivial regurgitation. - Pericardium, extracardiac: There was no pericardial Effusion.  ASSESSMENT AND PLAN: 1.  Cryptogenic stroke 2014 with no recurrence, PFO noted. We discussed recent FDA indication for transcatheter PFO closure based on long-term follow-up from the Respect Trial. The patient has done well now for 2 years since his initial stroke on medical therapy. It seems prudent to continue with this approach, especially since his PFO is relatively small. He would like to stop taking clopidogrel because of excessive bruising. Will change him to aspirin 81 mg daily. I offered him a referral to neurology for further discussion, but he prefers to stay with medical therapy at this point which I think is reasonable. I will see him back in one year.  2. Hyperlipidemia, statin intolerant: Most recent lipids reviewed. He has been seen in our lipid clinic. Will  continue with fish oil and Zetia.  Current medicines are reviewed with the patient today.  The patient does not have concerns regarding medicines.  Labs/ tests ordered today include:   Orders Placed This Encounter  Procedures  . Lipid panel  . EKG 12-Lead    Disposition:   FU one year  Signed, Richard Mocha, MD  09/15/2015 11:06 PM    [facility] [place], [place], [place]  [address] Phone: [phone]; Fax: [phone]

## 2015-10-03 ENCOUNTER — Other Ambulatory Visit: Payer: Self-pay | Admitting: Family Medicine

## 2015-10-03 NOTE — Telephone Encounter (Signed)
Refill done.  

## 2015-10-25 ENCOUNTER — Encounter: Payer: Self-pay | Admitting: Family Medicine

## 2015-10-25 DIAGNOSIS — M25552 Pain in left hip: Secondary | ICD-10-CM

## 2015-10-25 DIAGNOSIS — M5416 Radiculopathy, lumbar region: Secondary | ICD-10-CM

## 2015-10-31 ENCOUNTER — Ambulatory Visit
Admission: RE | Admit: 2015-10-31 | Discharge: 2015-10-31 | Disposition: A | Discharge status: Home / Self Care | Payer: Federal, State, Local not specified - PPO | Source: Ambulatory Visit | Attending: Family Medicine | Admitting: Family Medicine

## 2015-10-31 DIAGNOSIS — M25552 Pain in left hip: Secondary | ICD-10-CM

## 2015-10-31 DIAGNOSIS — M5416 Radiculopathy, lumbar region: Secondary | ICD-10-CM

## 2015-11-04 ENCOUNTER — Telehealth: Payer: Self-pay | Admitting: Cardiovascular Disease

## 2015-11-04 DIAGNOSIS — R972 Elevated prostate specific antigen [PSA]: Secondary | ICD-10-CM

## 2015-11-04 NOTE — Telephone Encounter (Signed)
Pt calling to request from Dr Burt Knack if he can add the lab PSA to his already scheduled lab appt for 11/07/15 at our office.  Pt states that he just had a MRI done of his hip, and the Radiologist found slight abnormality with his prostate, that could suggest prostate cancer, and he was advised the pt to have a PSA done in the very near future.  Pt will be coming into our office on 11/07/15 to have his lipids and LFTs checked, and wanted to know if Dr Burt Knack would allow him to have a PSA lab added on to the other orders.  Informed the pt that Dr Burt Knack is currently out of the office today, but I will most definitely route this message to him for further review and recommendation on requested additional lab, and someone will follow-up with the pt thereafter.  Pt verbalized understanding and agrees with this plan.

## 2015-11-04 NOTE — Telephone Encounter (Signed)
Fine to add PSA to lab draw. thx

## 2015-11-04 NOTE — Telephone Encounter (Signed)
New Message:  Pt called in wanting to know if Dr. Burt Knack could put in orders for him to have his PSA checked when he come in for his appt to see Gaspar Bidding on 2/20. Please f/u with him

## 2015-11-04 NOTE — Telephone Encounter (Signed)
Notified the pt back that per Dr Burt Knack, he gave the ok for the pt to have a PSA lab added on to his lab appt for 11/07/15 at our office. Pt verbalized understanding and so very gracious for all the assistance provided.

## 2015-11-07 ENCOUNTER — Ambulatory Visit (INDEPENDENT_AMBULATORY_CARE_PROVIDER_SITE_OTHER): Payer: Federal, State, Local not specified - PPO | Admitting: Family Medicine

## 2015-11-07 ENCOUNTER — Encounter: Payer: Self-pay | Admitting: Family Medicine

## 2015-11-07 ENCOUNTER — Ambulatory Visit (INDEPENDENT_AMBULATORY_CARE_PROVIDER_SITE_OTHER): Payer: Federal, State, Local not specified - PPO | Admitting: Physician Assistant

## 2015-11-07 ENCOUNTER — Encounter: Payer: Self-pay | Admitting: Physician Assistant

## 2015-11-07 ENCOUNTER — Other Ambulatory Visit: Payer: Federal, State, Local not specified - PPO | Admitting: *Deleted

## 2015-11-07 ENCOUNTER — Encounter: Payer: Self-pay | Admitting: Cardiovascular Disease

## 2015-11-07 VITALS — BP 112/70 | HR 47 | Ht 73.0 in | Wt 187.0 lb

## 2015-11-07 VITALS — BP 122/70 | HR 50 | Ht 73.0 in | Wt 188.0 lb

## 2015-11-07 DIAGNOSIS — Q211 Atrial septal defect: Secondary | ICD-10-CM

## 2015-11-07 DIAGNOSIS — E785 Hyperlipidemia, unspecified: Secondary | ICD-10-CM | POA: Diagnosis not present

## 2015-11-07 DIAGNOSIS — M5416 Radiculopathy, lumbar region: Secondary | ICD-10-CM

## 2015-11-07 DIAGNOSIS — M169 Osteoarthritis of hip, unspecified: Secondary | ICD-10-CM

## 2015-11-07 DIAGNOSIS — I208 Other forms of angina pectoris: Secondary | ICD-10-CM

## 2015-11-07 DIAGNOSIS — S76312A Strain of muscle, fascia and tendon of the posterior muscle group at thigh level, left thigh, initial encounter: Secondary | ICD-10-CM

## 2015-11-07 DIAGNOSIS — M24159 Other articular cartilage disorders, unspecified hip: Secondary | ICD-10-CM | POA: Insufficient documentation

## 2015-11-07 DIAGNOSIS — Q2112 Patent foramen ovale: Secondary | ICD-10-CM | POA: Insufficient documentation

## 2015-11-07 DIAGNOSIS — I2089 Other forms of angina pectoris: Secondary | ICD-10-CM | POA: Insufficient documentation

## 2015-11-07 DIAGNOSIS — R972 Elevated prostate specific antigen [PSA]: Secondary | ICD-10-CM

## 2015-11-07 LAB — BASIC METABOLIC PANEL
BUN: 27 mg/dL — ABNORMAL HIGH (ref 7–25)
CO2: 26 mmol/L (ref 20–31)
Calcium: 9.2 mg/dL (ref 8.6–10.3)
Chloride: 101 mmol/L (ref 98–110)
Creat: 1.1 mg/dL (ref 0.70–1.25)
Glucose, Bld: 100 mg/dL — ABNORMAL HIGH (ref 65–99)
Potassium: 4.5 mmol/L (ref 3.5–5.3)
Sodium: 138 mmol/L (ref 135–146)

## 2015-11-07 LAB — CBC
HCT: 38.3 % — ABNORMAL LOW (ref 39.0–52.0)
Hemoglobin: 13.1 g/dL (ref 13.0–17.0)
MCH: 31.9 pg (ref 26.0–34.0)
MCHC: 34.2 g/dL (ref 30.0–36.0)
MCV: 93.2 fL (ref 78.0–100.0)
MPV: 10.2 fL (ref 8.6–12.4)
Platelets: 180 10*3/uL (ref 150–400)
RBC: 4.11 MIL/uL — ABNORMAL LOW (ref 4.22–5.81)
RDW: 13.4 % (ref 11.5–15.5)
WBC: 5.1 10*3/uL (ref 4.0–10.5)

## 2015-11-07 LAB — HEPATIC FUNCTION PANEL
ALT: 23 U/L (ref 9–46)
AST: 27 U/L (ref 10–35)
Albumin: 4 g/dL (ref 3.6–5.1)
Alkaline Phosphatase: 38 U/L — ABNORMAL LOW (ref 40–115)
Bilirubin, Direct: 0.1 mg/dL (ref <=0.2)
Indirect Bilirubin: 0.4 mg/dL (ref 0.2–1.2)
Total Bilirubin: 0.5 mg/dL (ref 0.2–1.2)
Total Protein: 6.4 g/dL (ref 6.1–8.1)

## 2015-11-07 LAB — LIPID PANEL
Cholesterol: 241 mg/dL — ABNORMAL HIGH (ref 125–200)
HDL: 45 mg/dL (ref >=40)
LDL Cholesterol: 144 mg/dL — ABNORMAL HIGH (ref <130)
Total CHOL/HDL Ratio: 5.4 Ratio — ABNORMAL HIGH (ref <=5.0)
Triglycerides: 261 mg/dL — ABNORMAL HIGH (ref <150)
VLDL: 52 mg/dL — ABNORMAL HIGH (ref <30)

## 2015-11-07 LAB — TSH: TSH: 3.75 mIU/L (ref 0.40–4.50)

## 2015-11-07 MED ORDER — GABAPENTIN 100 MG PO CAPS: 100.0000 mg | ORAL_CAPSULE | Freq: Two times a day (BID) | ORAL | Status: DC

## 2015-11-07 NOTE — Patient Instructions (Signed)
Good to see you  Ice is your friend Gabapentin 100mg  in AM, 100mg  in PM and 300mg  at night I am here if you have questions Once the heart is all good then we will order epidural of your back.  Then I would want to see you again in 2 weeks after epidural.

## 2015-11-07 NOTE — Patient Instructions (Addendum)
Tarri Fuller, PA-C, has requested that you have a cardiac catheterization with Dr Burt Knack. Cardiac catheterization is used to diagnose and/or treat various heart conditions. Doctors may recommend this procedure for a number of different reasons. The most common reason is to evaluate chest pain. Chest pain can be a symptom of coronary artery disease (CAD), and cardiac catheterization can show whether plaque is narrowing or blocking your heart's arteries. This procedure is also used to evaluate the valves, as well as measure the blood flow and oxygen levels in different parts of your heart. For further information please visit [email or web address].   Following your catheterization, you will not be allowed to drive for 3 days.No lifting, pushing, or pulling greater that 10 pounds is allowed for 1 week.  1. Blood work-the blood work can be done no more than 7 days prior to the procedure. It can be done at any [facility] lab. There is one downstairs on the first floor of this building and one in the [facility] building [phone]).  2. Chest Xray-the chest xray order has already been placed at the [place].

## 2015-11-07 NOTE — Assessment & Plan Note (Signed)
Discussed with patient at great length. Patient if does not respond to epidural steroid injections back we will consider an intra-articular injection of the hip for diagnostic as well as therapeutic purposes. Looking at the images though I do think that this is been a chronic issue.

## 2015-11-07 NOTE — Progress Notes (Signed)
Patient ID: Richard Ellis, male   DOB: 08/24/1946, 70 y.o.   MRN: [id number]    Date:  11/07/2015   ID:  Richard Ellis, DOB 1946-08-24, MRN [id number]  PCP:  [name], MD  Primary Cardiologist:  Burt Knack    Chief complaint: Chest pain   History of Present Illness: Richard Ellis is a 70 y.o. male with a history of PFO, stroke, hyperlipidemia, hypothyroidism, anemia.  He presented in 2014 with a cryptogenic stroke and was found to have a PFO with right-to-left shunt. MRI at that time showed a small right occipital lobe cortical infarct. At that time we elected to treat him medically with clopidogrel. He also has hyperlipidemia and has been statin intolerant. He's been followed in the lipid clinic and he has been treated with Zetia, fish oil, and Krill oil. He has now discontinued Krill oil.    patient reports with complaints of exertional chest pain. 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 He reports going out for a run for about an hour and 15 minutes a little over a week ago and developing chest pain and severe shortness of breath out of character for the amount of exertion. When he stopped running the chest pain eased off. He tried again a week later and within 5 minutes he developed chest pain an shortness of breath. The patient currently denies nausea, vomiting, fever, orthopnea, dizziness, PND, cough, congestion, abdominal pain, hematochezia, melena, lower extremity edema, claudication.  Wt Readings from Last 3 Encounters:  11/07/15 187 lb (84.823 kg)  10/31/15 180 lb (81.647 kg)  10/31/15 180 lb (81.647 kg)     Past Medical History  Diagnosis Date  . Hyperlipidemia   . Allergy     rhinitis  . Bronchitis   . Hypothyroidism   . Heart murmur     "as a child" (07/02/2013)  . Chronic bronchitis ([place])     "q year" (07/02/2013)  . Anemia     "Hgb always on the low side" (07/02/2013)  . Esophageal stricture   . Stroke ([place]) 07/02/2013    "small ischemic occipital right  sided" (07/02/2013)  . Osteoarthritis     "maybe a little bit in my spine" (07/02/2013)  . PFO (patent foramen ovale)     with right to left shunting    Current Outpatient Prescriptions  Medication Sig Dispense Refill  . aspirin EC 81 MG tablet Take 1 tablet (81 mg total) by mouth daily.    [name] BREO ELLIPTA 200-25 MCG/INH AEPB Inhale 1 puff into the lungs daily.    . Cholecalciferol 2000 UNITS CAPS Take 1 capsule by mouth daily.      [name] DYMISTA 137-50 MCG/ACT SUSP Place 1 spray into both nostrils 2 (two) times daily.  5  . ezetimibe (ZETIA) 10 MG tablet Take 1 tablet (10 mg total) by mouth daily. 90 tablet 3  . gabapentin (NEURONTIN) 300 MG capsule nightly 30 capsule 3  . ibuprofen (ADVIL,MOTRIN) 200 MG tablet Take 400 mg by mouth every 6 (six) hours as needed (for pain).     [name] levothyroxine (SYNTHROID, LEVOTHROID) 25 MCG tablet TAKE ONE TABLET BY MOUTH ONE TIME DAILY 30 tablet 11  . Magnesium 250 MG TABS Take 250 mg by mouth daily.    . meloxicam (MOBIC) 15 MG tablet TAKE 1 TABLET BY MOUTH DAILY. 30 tablet 3  . Omega-3 Fatty Acids (FISH OIL) 1000 MG CAPS Take 2,000 mg by mouth 2 (two) times daily.    [name]  PROAIR RESPICLICK 123XX123 (90 Base) MCG/ACT AEPB Inhale 2 puffs into the lungs every 4 (four) hours as needed. Wheezing or sob  0  . sildenafil (VIAGRA) 100 MG tablet Take 1/4-1/2  tablet by mouth as needed    . Umeclidinium-Vilanterol (ANORO ELLIPTA) 62.5-25 MCG/INH AEPB Inhale 1 Act into the lungs daily. 1 each 3   No current facility-administered medications for this visit.    Allergies:    Allergies  Allergen Reactions  . Atorvastatin Other (See Comments)    Muscle weakness  . Crestor [Rosuvastatin]     Muscle weakness  . Demerol [Meperidine] Nausea Only  . Niacin And Related Itching and Other (See Comments)    Palms turned red and itched    Social History:  The patient  reports that he has never smoked. He has never used smokeless tobacco. He reports that he drinks about 4.8 oz of  alcohol per week. He reports that he does not use illicit drugs.   Family history:   Family History  Problem Relation Age of Onset  . Coronary artery disease Other     male first degree relative <60  . Heart disease Father     heart attack  . Colon cancer Neg Hx     ROS:  Please see the history of present illness.  All other systems reviewed and negative.   PHYSICAL EXAM: VS:  BP 112/70 mmHg  Pulse 47  Ht 6\' 1"  (1.854 m)  Wt 187 lb (84.823 kg)  BMI 24.68 kg/m2 Well nourished, well developed, in no acute distress HEENT: Pupils are equal round react to light accommodation extraocular movements are intact.  Neck: no JVDNo cervical lymphadenopathy. Cardiac: Regular rhythm without murmurs rubs or gallops. Lungs:  clear to auscultation bilaterally, no wheezing, rhonchi or rales Abd: soft, nontender, positive bowel sounds all quadrants, no hepatosplenomegaly Ext: no lower extremity edema.  2+ radial and dorsalis pedis pulses. Skin: warm and dry Neuro:  Grossly normal  EKG:  Sinus bradycardia rate 47 bpm. T-wave inversion V2 through 3( new) and slight J-point elevation in inferior leads ( old).  ASSESSMENT AND PLAN:  Problem List Items Addressed This Visit    Stable angina ([place]) - Primary   Relevant Orders   EKG 12-Lead   LEFT HEART CATHETERIZATION WITH CORONARY/GRAFT ANGIOGRAM   CBC   Basic metabolic panel   Protime-INR   APTT   TSH   DG Chest 2 View   Patent foramen ovale   Hyperlipidemia      Richard Ellis is having stable angina.  He has new T-wave inversions on his EKG in the 2 and 3.  This discussed his case with Dr. [place] and he will be scheduled for left heart catheterization along  with precath laboratory and chest x-ray. I've asked him not to do any running until procedure's completed.    He is on aspirin, Zetia. His heart rate is 47  So I will not start him on a beta blocker.  The patient understands that risks include but are not limited to stroke (1 in 1000),  death (1 in 95), kidney failure [usually temporary] (1 in 500), bleeding (1 in 200), allergic reaction [possibly serious] (1 in 200). The patient understands and is willing to proceed.

## 2015-11-07 NOTE — Assessment & Plan Note (Signed)
Continue to believe that this is likely secondary to this lumbar radiculopathy. This could be the L4 or potentially even the S1 nerve root impingement. I do think the patient could respond well to an L4-L5 epidural. At this moment patient is scheduled to have a cardiac catheterization in 2 days. I would like to wait till after this before we make any significant changes or procedures. Patient though was put on gabapentin 100 mg 2 times daily and 300 mg at night. Hopefully this will help with some of the discomfort. Encourage him to decrease the amount of anti-inflammatories. Patient and will come back and see me again in 2 weeks after the epidural. He will write me when he gets the okay from the cardiologist.  Spent  25 minutes with patient face-to-face and had greater than 50% of counseling including as described above in assessment and plan.

## 2015-11-07 NOTE — Addendum Note (Signed)
Addended by: Diana Eves on: 11/07/2015 01:11 PM   Modules accepted: Orders

## 2015-11-07 NOTE — Progress Notes (Signed)
Pre visit review using our clinic review tool, if applicable. No additional management support is needed unless otherwise documented below in the visit note. 

## 2015-11-07 NOTE — Progress Notes (Signed)
[name] [address], [place] [address] Phone: [phone] Subjective:       CC: neck pain follow-up  [id number] Richard Ellis is a 70 y.o. male coming in for followup of hip/bacl pain. Patient presentation less than was more concerning for a lumbar radiculopathy. Not responding to the conservative therapy at this time. Was worsening. Was sent for an MRI. This was independently visualized by me. Patient did have a L4-L5 left foraminal annular fissure with irritation of the left L4 nerve root as well as a broad based disc herniation impinging on the S1 nerve root on the left side.  Patient was also sent for an MRI of the left hip. Patient's left hip was also independently visualized. This showed the patient did have a anterior superior acetabular labrum tear as well as some very mild tearing of the left hamstring.  Past Medical History  Diagnosis Date  . Hyperlipidemia   . Allergy     rhinitis  . Bronchitis   . Hypothyroidism   . Heart murmur     "as a child" (07/02/2013)  . Chronic bronchitis ([place])     "q year" (07/02/2013)  . Anemia     "Hgb always on the low side" (07/02/2013)  . Esophageal stricture   . Stroke ([place]) 07/02/2013    "small ischemic occipital right sided" (07/02/2013)  . Osteoarthritis     "maybe a little bit in my spine" (07/02/2013)  . PFO (patent foramen ovale)     with right to left shunting   Past Surgical History  Procedure Laterality Date  . Esophagogastroduodenoscopy N/A 10/26/2012    Procedure: ESOPHAGOGASTRODUODENOSCOPY (EGD);  Surgeon: Winfield Cunas., MD;  Location: [facility] ENDOSCOPY;  Service: Endoscopy;  Laterality: N/A;  . Tonsillectomy  1950's  . Esophagogastroduodenoscopy N/A 07/08/2013    Procedure: ESOPHAGOGASTRODUODENOSCOPY (EGD);  Surgeon: Winfield Cunas., MD;  Location: [facility] ENDOSCOPY;  Service: Endoscopy;  Laterality: N/A;  . Savory dilation N/A 07/08/2013    Procedure: SAVORY DILATION;   Surgeon: Winfield Cunas., MD;  Location: [facility] ENDOSCOPY;  Service: Endoscopy;  Laterality: N/A;  . Tee without cardioversion N/A 07/28/2013    Procedure: TRANSESOPHAGEAL ECHOCARDIOGRAM (TEE);  Surgeon: Pixie Casino, MD;  Location: [facility] ENDOSCOPY;  Service: Cardiovascular;  Laterality: N/A;   Social History  Substance Use Topics  . Smoking status: Never Smoker   . Smokeless tobacco: Never Used  . Alcohol Use: 4.8 oz/week    4 Cans of beer, 4 Glasses of wine per week     Comment: 07/02/2013 "glass of wine or a beer most nights of the week"   Allergies  Allergen Reactions  . Atorvastatin Other (See Comments)    Muscle weakness  . Crestor [Rosuvastatin]     Muscle weakness  . Demerol [Meperidine] Nausea Only  . Niacin And Related Itching and Other (See Comments)    Palms turned red and itched   Family History  Problem Relation Age of Onset  . Coronary artery disease Other     male first degree relative <60  . Heart disease Father     heart attack  . Colon cancer Neg Hx           Past medical history, social, surgical and family history all reviewed in electronic medical record.   Review of Systems: No headache, visual changes, nausea, vomiting, diarrhea, constipation, dizziness, abdominal pain, skin rash, fevers, chills, night sweats, weight loss, swollen lymph nodes, body  aches, joint swelling, muscle aches, chest pain, shortness of breath, mood changes.   Objective Blood pressure 122/70, pulse 50, weight 188 lb (85.276 kg), SpO2 95 %.  General: No apparent distress alert and oriented x3 mood and affect normal, dressed appropriately.  HEENT: Pupils equal, extraocular movements intact  Respiratory: Patient's speak in full sentences and does not appear short of breath  Cardiovascular: No lower extremity edema, non tender, no erythema  Skin: Warm dry intact with no signs of infection or rash on extremities or on axial skeleton.  Abdomen: Soft nontender  Neuro: Cranial  nerves II through XII are intact, neurovascularly intact in all extremities with 2+ DTRs and 2+ pulses.  Lymph: No lymphadenopathy of posterior or anterior cervical chain or axillae bilaterally.  Gait normal with good balance and coordination.  MSK:  Non tender with full range of motion and good stability and symmetric strength and tone of shoulders, elbows, wrist,  knee and ankles bilaterally.  Hip: Left ROM IR: 20 Deg, ER: 45 Deg, Flexion: 120 Deg, Extension: 100 Deg, Abduction: 45 Deg, Adduction: 25 Deg Strength IR: 5/5, ER: 5/5, Flexion: 5/5, Extension: 5/5, Abduction: 4/5, Adduction: 5/5 Pelvic alignment unremarkable to inspection and palpation. Standing hip rotation and gait without trendelenburg sign / unsteadiness. Greater trochanter without tenderness to palpation. No tenderness over piriformis and greater trochanter. No pain with FABER or FADIR. No SI joint tenderness and normal minimal SI movement. Continued positive straight leg test with radicular symptoms. Minimal discomfort to palpation in the paraspinal musculature of the lumbar spine on the left side No significant change from previous exam.      Impression and Recommendations:     This case required medical decision making of moderate complexity.

## 2015-11-08 ENCOUNTER — Other Ambulatory Visit: Payer: Federal, State, Local not specified - PPO

## 2015-11-08 LAB — APTT: aPTT: 27 seconds (ref 24–37)

## 2015-11-08 LAB — PSA: PSA: 2.36 ng/mL (ref <=4.00)

## 2015-11-08 LAB — PROTIME-INR
INR: 0.95 (ref <1.50)
Prothrombin Time: 12.8 seconds (ref 11.6–15.2)

## 2015-11-10 ENCOUNTER — Encounter (HOSPITAL_COMMUNITY)
Admission: RE | Disposition: A | Discharge status: Home / Self Care | Payer: Self-pay | Source: Ambulatory Visit | Attending: Cardiovascular Disease

## 2015-11-10 ENCOUNTER — Ambulatory Visit (HOSPITAL_COMMUNITY)
Admission: RE | Admit: 2015-11-10 | Discharge: 2015-11-11 | Disposition: A | Discharge status: Home / Self Care | Payer: Federal, State, Local not specified - PPO | Source: Ambulatory Visit | Attending: Cardiovascular Disease | Admitting: Cardiovascular Disease

## 2015-11-10 DIAGNOSIS — Z7982 Long term (current) use of aspirin: Secondary | ICD-10-CM | POA: Insufficient documentation

## 2015-11-10 DIAGNOSIS — E039 Hypothyroidism, unspecified: Secondary | ICD-10-CM | POA: Diagnosis not present

## 2015-11-10 DIAGNOSIS — I25118 Atherosclerotic heart disease of native coronary artery with other forms of angina pectoris: Secondary | ICD-10-CM | POA: Diagnosis not present

## 2015-11-10 DIAGNOSIS — Z79899 Other long term (current) drug therapy: Secondary | ICD-10-CM | POA: Insufficient documentation

## 2015-11-10 DIAGNOSIS — E785 Hyperlipidemia, unspecified: Secondary | ICD-10-CM | POA: Diagnosis present

## 2015-11-10 DIAGNOSIS — Z8673 Personal history of transient ischemic attack (TIA), and cerebral infarction without residual deficits: Secondary | ICD-10-CM | POA: Diagnosis not present

## 2015-11-10 DIAGNOSIS — I208 Other forms of angina pectoris: Secondary | ICD-10-CM

## 2015-11-10 DIAGNOSIS — Z955 Presence of coronary angioplasty implant and graft: Secondary | ICD-10-CM

## 2015-11-10 DIAGNOSIS — Z8249 Family history of ischemic heart disease and other diseases of the circulatory system: Secondary | ICD-10-CM | POA: Insufficient documentation

## 2015-11-10 DIAGNOSIS — I25110 Atherosclerotic heart disease of native coronary artery with unstable angina pectoris: Secondary | ICD-10-CM | POA: Diagnosis not present

## 2015-11-10 DIAGNOSIS — R079 Chest pain, unspecified: Secondary | ICD-10-CM | POA: Insufficient documentation

## 2015-11-10 DIAGNOSIS — I2089 Other forms of angina pectoris: Secondary | ICD-10-CM

## 2015-11-10 DIAGNOSIS — Z7902 Long term (current) use of antithrombotics/antiplatelets: Secondary | ICD-10-CM | POA: Insufficient documentation

## 2015-11-10 DIAGNOSIS — I25810 Atherosclerosis of coronary artery bypass graft(s) without angina pectoris: Secondary | ICD-10-CM | POA: Diagnosis present

## 2015-11-10 HISTORY — PX: CARDIAC CATHETERIZATION: SHX172

## 2015-11-10 LAB — POCT ACTIVATED CLOTTING TIME
Activated Clotting Time: 224 seconds
Activated Clotting Time: 234 seconds
Activated Clotting Time: 260 seconds

## 2015-11-10 SURGERY — LEFT HEART CATH AND CORONARY ANGIOGRAPHY
Anesthesia: LOCAL

## 2015-11-10 MED ORDER — VITAMIN D3 25 MCG (1000 UNIT) PO TABS
1000.0000 [IU] | ORAL_TABLET | Freq: Every day | ORAL | Status: DC
  Administered 2015-11-10 – 2015-11-11 (×2): 1000 [IU] via ORAL
  Filled 2015-11-10 (×3): qty 1

## 2015-11-10 MED ORDER — LEVOTHYROXINE SODIUM 50 MCG PO TABS
25.0000 ug | ORAL_TABLET | Freq: Every day | ORAL | Status: DC
  Administered 2015-11-11: 09:00:00 25 ug via ORAL
  Filled 2015-11-10: qty 1

## 2015-11-10 MED ORDER — GABAPENTIN 300 MG PO CAPS
300.0000 mg | ORAL_CAPSULE | Freq: Every day | ORAL | Status: DC
  Administered 2015-11-10: 300 mg via ORAL
  Filled 2015-11-10: qty 1

## 2015-11-10 MED ORDER — LIDOCAINE HCL (PF) 1 % IJ SOLN
INTRAMUSCULAR | Status: AC
Start: 2015-11-10 — End: 2015-11-10
  Filled 2015-11-10: qty 30

## 2015-11-10 MED ORDER — VERAPAMIL HCL 2.5 MG/ML IV SOLN
INTRAVENOUS | Status: DC | PRN
  Administered 2015-11-10: 14:00:00 via INTRA_ARTERIAL

## 2015-11-10 MED ORDER — SODIUM CHLORIDE 0.9% FLUSH: 3.0000 mL | INTRAVENOUS | Status: DC | PRN

## 2015-11-10 MED ORDER — NITROGLYCERIN 1 MG/10 ML FOR IR/CATH LAB
INTRA_ARTERIAL | Status: AC
  Filled 2015-11-10: qty 10

## 2015-11-10 MED ORDER — ONDANSETRON HCL 4 MG/2ML IJ SOLN: 4.0000 mg | Freq: Four times a day (QID) | INTRAMUSCULAR | Status: DC | PRN

## 2015-11-10 MED ORDER — HEPARIN SODIUM (PORCINE) 1000 UNIT/ML IJ SOLN
INTRAMUSCULAR | Status: DC | PRN
Start: 2015-11-10 — End: 2015-11-10
  Administered 2015-11-10: 3000 [IU] via INTRAVENOUS
  Administered 2015-11-10: 4000 [IU] via INTRAVENOUS
  Administered 2015-11-10: 5000 [IU] via INTRAVENOUS

## 2015-11-10 MED ORDER — EZETIMIBE 10 MG PO TABS
10.0000 mg | ORAL_TABLET | Freq: Every day | ORAL | Status: DC
  Administered 2015-11-11: 10 mg via ORAL
  Filled 2015-11-10 (×2): qty 1

## 2015-11-10 MED ORDER — ALBUTEROL SULFATE (2.5 MG/3ML) 0.083% IN NEBU: 2.5000 mg | INHALATION_SOLUTION | RESPIRATORY_TRACT | Status: DC | PRN

## 2015-11-10 MED ORDER — IOHEXOL 350 MG/ML SOLN
INTRAVENOUS | Status: DC | PRN
  Administered 2015-11-10: 140 mL via INTRA_ARTERIAL

## 2015-11-10 MED ORDER — SODIUM CHLORIDE 0.9 % IV SOLN: 250.0000 mL | INTRAVENOUS | Status: DC | PRN

## 2015-11-10 MED ORDER — HEPARIN SODIUM (PORCINE) 1000 UNIT/ML IJ SOLN
INTRAMUSCULAR | Status: AC
  Filled 2015-11-10: qty 1

## 2015-11-10 MED ORDER — FENTANYL CITRATE (PF) 100 MCG/2ML IJ SOLN
INTRAMUSCULAR | Status: AC
  Filled 2015-11-10: qty 2

## 2015-11-10 MED ORDER — SODIUM CHLORIDE 0.9 % IV SOLN: INTRAVENOUS | Status: AC

## 2015-11-10 MED ORDER — MELOXICAM 15 MG PO TABS
15.0000 mg | ORAL_TABLET | Freq: Every day | ORAL | Status: DC
  Filled 2015-11-10: qty 1

## 2015-11-10 MED ORDER — SODIUM CHLORIDE 0.9 % WEIGHT BASED INFUSION
1.0000 mL/kg/h | INTRAVENOUS | Status: DC
  Administered 2015-11-10: 1 mL/kg/h via INTRAVENOUS

## 2015-11-10 MED ORDER — CLOPIDOGREL BISULFATE 300 MG PO TABS
ORAL_TABLET | ORAL | Status: AC
  Filled 2015-11-10: qty 2

## 2015-11-10 MED ORDER — ACETAMINOPHEN 325 MG PO TABS: 650.0000 mg | ORAL_TABLET | ORAL | Status: DC | PRN

## 2015-11-10 MED ORDER — CLOPIDOGREL BISULFATE 75 MG PO TABS
75.0000 mg | ORAL_TABLET | Freq: Every day | ORAL | Status: DC
  Administered 2015-11-11: 75 mg via ORAL
  Filled 2015-11-10: qty 1

## 2015-11-10 MED ORDER — CLOPIDOGREL BISULFATE 300 MG PO TABS
ORAL_TABLET | ORAL | Status: DC | PRN
  Administered 2015-11-10: 600 mg via ORAL

## 2015-11-10 MED ORDER — MIDAZOLAM HCL 2 MG/2ML IJ SOLN
INTRAMUSCULAR | Status: AC
  Filled 2015-11-10: qty 2

## 2015-11-10 MED ORDER — FENTANYL CITRATE (PF) 100 MCG/2ML IJ SOLN
INTRAMUSCULAR | Status: DC | PRN
  Administered 2015-11-10 (×2): 25 ug via INTRAVENOUS

## 2015-11-10 MED ORDER — NITROGLYCERIN 1 MG/10 ML FOR IR/CATH LAB
INTRA_ARTERIAL | Status: DC | PRN
  Administered 2015-11-10 (×3): 150 ug via INTRACORONARY

## 2015-11-10 MED ORDER — SODIUM CHLORIDE 0.9% FLUSH
3.0000 mL | Freq: Two times a day (BID) | INTRAVENOUS | Status: DC
  Administered 2015-11-10 – 2015-11-11 (×2): 3 mL via INTRAVENOUS

## 2015-11-10 MED ORDER — GABAPENTIN 100 MG PO CAPS
100.0000 mg | ORAL_CAPSULE | Freq: Two times a day (BID) | ORAL | Status: DC
  Administered 2015-11-10 – 2015-11-11 (×2): 100 mg via ORAL
  Filled 2015-11-10 (×2): qty 1

## 2015-11-10 MED ORDER — SODIUM CHLORIDE 0.9 % WEIGHT BASED INFUSION
3.0000 mL/kg/h | INTRAVENOUS | Status: DC
  Administered 2015-11-10: 3 mL/kg/h via INTRAVENOUS

## 2015-11-10 MED ORDER — ASPIRIN 81 MG PO CHEW: 81.0000 mg | CHEWABLE_TABLET | ORAL | Status: DC

## 2015-11-10 MED ORDER — MIDAZOLAM HCL 2 MG/2ML IJ SOLN
INTRAMUSCULAR | Status: DC | PRN
  Administered 2015-11-10: 2 mg via INTRAVENOUS
  Administered 2015-11-10: 1 mg via INTRAVENOUS

## 2015-11-10 MED ORDER — LIDOCAINE HCL (PF) 1 % IJ SOLN
INTRAMUSCULAR | Status: DC | PRN
  Administered 2015-11-10: 5 mL via INTRADERMAL

## 2015-11-10 MED ORDER — ASPIRIN EC 81 MG PO TBEC
81.0000 mg | DELAYED_RELEASE_TABLET | Freq: Every day | ORAL | Status: DC
  Administered 2015-11-11: 09:00:00 81 mg via ORAL
  Filled 2015-11-10: qty 1

## 2015-11-10 MED ORDER — HEPARIN (PORCINE) IN NACL 2-0.9 UNIT/ML-% IJ SOLN
INTRAMUSCULAR | Status: DC | PRN
  Administered 2015-11-10: 15:00:00

## 2015-11-10 MED ORDER — VERAPAMIL HCL 2.5 MG/ML IV SOLN
INTRAVENOUS | Status: AC
  Filled 2015-11-10: qty 2

## 2015-11-10 MED ORDER — OXYCODONE-ACETAMINOPHEN 5-325 MG PO TABS: 1.0000 | ORAL_TABLET | ORAL | Status: DC | PRN

## 2015-11-10 MED ORDER — HEPARIN (PORCINE) IN NACL 2-0.9 UNIT/ML-% IJ SOLN
INTRAMUSCULAR | Status: AC
  Filled 2015-11-10: qty 1000

## 2015-11-10 MED ORDER — MAGNESIUM 200 MG PO TABS
250.0000 mg | ORAL_TABLET | ORAL | Status: DC
  Filled 2015-11-10: qty 2

## 2015-11-10 SURGICAL SUPPLY — 20 items
BALLN EUPHORA RX 2.5X15 (BALLOONS) ×2
BALLN ~~LOC~~ EUPHORA RX 3.25X15 (BALLOONS) ×2
BALLOON EUPHORA RX 2.5X15 (BALLOONS) IMPLANT
BALLOON ~~LOC~~ EUPHORA RX 3.25X15 (BALLOONS) IMPLANT
CATH INFINITI 5 FR JL3.5 (CATHETERS) ×1 IMPLANT
CATH INFINITI 5FR ANG PIGTAIL (CATHETERS) ×1 IMPLANT
CATH INFINITI JR4 5F (CATHETERS) ×1 IMPLANT
CATH VISTA GUIDE 6FR XBLAD3.5 (CATHETERS) ×1 IMPLANT
CATH VISTA GUIDE 6FR XBLAD4 (CATHETERS) ×1 IMPLANT
GLIDESHEATH SLEND SS 6F .021 (SHEATH) ×1 IMPLANT
KIT ENCORE 26 ADVANTAGE (KITS) ×1 IMPLANT
KIT HEART LEFT (KITS) ×2 IMPLANT
PACK CARDIAC CATHETERIZATION (CUSTOM PROCEDURE TRAY) ×2 IMPLANT
STENT RESOLUTE INTEG 3.0X18 (Permanent Stent) ×1 IMPLANT
SYR MEDRAD MARK V 150ML (SYRINGE) ×2 IMPLANT
TRANSDUCER W/STOPCOCK (MISCELLANEOUS) ×2 IMPLANT
TUBING CIL FLEX 10 FLL-RA (TUBING) ×2 IMPLANT
WIRE ASAHI GRAND SLAM 180CM (WIRE) ×1 IMPLANT
WIRE COUGAR XT STRL 190CM (WIRE) ×1 IMPLANT
WIRE SAFE-T 1.5MM-J .035X260CM (WIRE) ×1 IMPLANT

## 2015-11-10 NOTE — Progress Notes (Signed)
TR BAND REMOVAL  LOCATION:    right radial  DEFLATED PER PROTOCOL:    Yes.    TIME BAND OFF / DRESSING APPLIED:    1915   SITE UPON ARRIVAL:    Level 0  SITE AFTER BAND REMOVAL:    Level 0  CIRCULATION SENSATION AND MOVEMENT:    Within Normal Limits   Yes.    COMMENTS:   Tolerated procedure well, post TRB instructions given

## 2015-11-10 NOTE — Care Management Note (Signed)
Case Management Note  Patient Details  Name: EUDELL ANGEVINE MRN: [id number] Date of Birth: 10/30/1945  Subjective/Objective:   Patient is from home, on plavix, NCM will cont to follow for dc needs.                 Action/Plan:   Expected Discharge Date:                  Expected Discharge Plan:  Home/Self Care  In-House Referral:     Discharge planning Services  CM Consult  Post Acute Care Choice:    Choice offered to:     DME Arranged:    DME Agency:     HH Arranged:    [place] Agency:     Status of Service:  Completed, signed off  Medicare Important Message Given:    Date Medicare IM Given:    Medicare IM give by:    Date Additional Medicare IM Given:    Additional Medicare Important Message give by:     If discussed at [place] of Stay Meetings, dates discussed:    Additional Comments:  Zenon Mayo, RN 11/10/2015, 4:34 PM

## 2015-11-10 NOTE — H&P (View-Only) (Signed)
Patient ID: Richard Ellis, male   DOB: 16-Dec-1945, 70 y.o.   MRN: [id number]    Date:  11/07/2015   ID:  Richard Ellis, DOB March 19, 1946, MRN [id number]  PCP:  [name], MD  Primary Cardiologist:  Burt Knack    Chief complaint: Chest pain   History of Present Illness: Richard Ellis is a 70 y.o. male with a history of PFO, stroke, hyperlipidemia, hypothyroidism, anemia.  He presented in 2014 with a cryptogenic stroke and was found to have a PFO with right-to-left shunt. MRI at that time showed a small right occipital lobe cortical infarct. At that time we elected to treat him medically with clopidogrel. He also has hyperlipidemia and has been statin intolerant. He's been followed in the lipid clinic and he has been treated with Zetia, fish oil, and Krill oil. He has now discontinued Krill oil.    patient reports with complaints of exertional chest pain. 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 He reports going out for a run for about an hour and 15 minutes a little over a week ago and developing chest pain and severe shortness of breath out of character for the amount of exertion. When he stopped running the chest pain eased off. He tried again a week later and within 5 minutes he developed chest pain an shortness of breath. The patient currently denies nausea, vomiting, fever, orthopnea, dizziness, PND, cough, congestion, abdominal pain, hematochezia, melena, lower extremity edema, claudication.  Wt Readings from Last 3 Encounters:  11/07/15 187 lb (84.823 kg)  10/31/15 180 lb (81.647 kg)  10/31/15 180 lb (81.647 kg)     Past Medical History  Diagnosis Date  . Hyperlipidemia   . Allergy     rhinitis  . Bronchitis   . Hypothyroidism   . Heart murmur     "as a child" (07/02/2013)  . Chronic bronchitis ([place])     "q year" (07/02/2013)  . Anemia     "Hgb always on the low side" (07/02/2013)  . Esophageal stricture   . Stroke ([place]) 07/02/2013    "small ischemic occipital right  sided" (07/02/2013)  . Osteoarthritis     "maybe a little bit in my spine" (07/02/2013)  . PFO (patent foramen ovale)     with right to left shunting    Current Outpatient Prescriptions  Medication Sig Dispense Refill  . aspirin EC 81 MG tablet Take 1 tablet (81 mg total) by mouth daily.    [name] BREO ELLIPTA 200-25 MCG/INH AEPB Inhale 1 puff into the lungs daily.    . Cholecalciferol 2000 UNITS CAPS Take 1 capsule by mouth daily.      [name] DYMISTA 137-50 MCG/ACT SUSP Place 1 spray into both nostrils 2 (two) times daily.  5  . ezetimibe (ZETIA) 10 MG tablet Take 1 tablet (10 mg total) by mouth daily. 90 tablet 3  . gabapentin (NEURONTIN) 300 MG capsule nightly 30 capsule 3  . ibuprofen (ADVIL,MOTRIN) 200 MG tablet Take 400 mg by mouth every 6 (six) hours as needed (for pain).     [name] levothyroxine (SYNTHROID, LEVOTHROID) 25 MCG tablet TAKE ONE TABLET BY MOUTH ONE TIME DAILY 30 tablet 11  . Magnesium 250 MG TABS Take 250 mg by mouth daily.    . meloxicam (MOBIC) 15 MG tablet TAKE 1 TABLET BY MOUTH DAILY. 30 tablet 3  . Omega-3 Fatty Acids (FISH OIL) 1000 MG CAPS Take 2,000 mg by mouth 2 (two) times daily.    [name]  PROAIR RESPICLICK 123XX123 (90 Base) MCG/ACT AEPB Inhale 2 puffs into the lungs every 4 (four) hours as needed. Wheezing or sob  0  . sildenafil (VIAGRA) 100 MG tablet Take 1/4-1/2  tablet by mouth as needed    . Umeclidinium-Vilanterol (ANORO ELLIPTA) 62.5-25 MCG/INH AEPB Inhale 1 Act into the lungs daily. 1 each 3   No current facility-administered medications for this visit.    Allergies:    Allergies  Allergen Reactions  . Atorvastatin Other (See Comments)    Muscle weakness  . Crestor [Rosuvastatin]     Muscle weakness  . Demerol [Meperidine] Nausea Only  . Niacin And Related Itching and Other (See Comments)    Palms turned red and itched    Social History:  The patient  reports that he has never smoked. He has never used smokeless tobacco. He reports that he drinks about 4.8 oz of  alcohol per week. He reports that he does not use illicit drugs.   Family history:   Family History  Problem Relation Age of Onset  . Coronary artery disease Other     male first degree relative <60  . Heart disease Father     heart attack  . Colon cancer Neg Hx     ROS:  Please see the history of present illness.  All other systems reviewed and negative.   PHYSICAL EXAM: VS:  BP 112/70 mmHg  Pulse 47  Ht 6\' 1"  (1.854 m)  Wt 187 lb (84.823 kg)  BMI 24.68 kg/m2 Well nourished, well developed, in no acute distress HEENT: Pupils are equal round react to light accommodation extraocular movements are intact.  Neck: no JVDNo cervical lymphadenopathy. Cardiac: Regular rhythm without murmurs rubs or gallops. Lungs:  clear to auscultation bilaterally, no wheezing, rhonchi or rales Abd: soft, nontender, positive bowel sounds all quadrants, no hepatosplenomegaly Ext: no lower extremity edema.  2+ radial and dorsalis pedis pulses. Skin: warm and dry Neuro:  Grossly normal  EKG:  Sinus bradycardia rate 47 bpm. T-wave inversion V2 through 3( new) and slight J-point elevation in inferior leads ( old).  ASSESSMENT AND PLAN:  Problem List Items Addressed This Visit    Stable angina ([place]) - Primary   Relevant Orders   EKG 12-Lead   LEFT HEART CATHETERIZATION WITH CORONARY/GRAFT ANGIOGRAM   CBC   Basic metabolic panel   Protime-INR   APTT   TSH   DG Chest 2 View   Patent foramen ovale   Hyperlipidemia      Mr. Pacha is having stable angina.  He has new T-wave inversions on his EKG in the 2 and 3.  This discussed his case with Dr. [place] and he will be scheduled for left heart catheterization along  with precath laboratory and chest x-ray. I've asked him not to do any running until procedure's completed.    He is on aspirin, Zetia. His heart rate is 47  So I will not start him on a beta blocker.  The patient understands that risks include but are not limited to stroke (1 in 1000),  death (1 in 87), kidney failure [usually temporary] (1 in 500), bleeding (1 in 200), allergic reaction [possibly serious] (1 in 200). The patient understands and is willing to proceed.

## 2015-11-10 NOTE — Interval H&P Note (Signed)
Cath Lab Visit (complete for each Cath Lab visit)  Clinical Evaluation Leading to the Procedure:   ACS: No.  Non-ACS:    Anginal Classification: CCS III  Anti-ischemic medical therapy: No Therapy  Non-Invasive Test Results: No non-invasive testing performed  Prior CABG: No previous CABG      History and Physical Interval Note:  11/10/2015 1:27 PM  Richard Ellis  has presented today for surgery, with the diagnosis of c/p  The various methods of treatment have been discussed with the patient and family. After consideration of risks, benefits and other options for treatment, the patient has consented to  Procedure(s): Left Heart Cath and Coronary Angiography (N/A) as a surgical intervention .  The patient's history has been reviewed, patient examined, no change in status, stable for surgery.  I have reviewed the patient's chart and labs.  Questions were answered to the patient's satisfaction.     [name]

## 2015-11-11 ENCOUNTER — Encounter (HOSPITAL_COMMUNITY): Payer: Self-pay | Admitting: Cardiovascular Disease

## 2015-11-11 ENCOUNTER — Telehealth: Payer: Self-pay | Admitting: *Deleted

## 2015-11-11 DIAGNOSIS — I25810 Atherosclerosis of coronary artery bypass graft(s) without angina pectoris: Secondary | ICD-10-CM | POA: Diagnosis present

## 2015-11-11 DIAGNOSIS — E785 Hyperlipidemia, unspecified: Secondary | ICD-10-CM | POA: Diagnosis not present

## 2015-11-11 DIAGNOSIS — E039 Hypothyroidism, unspecified: Secondary | ICD-10-CM | POA: Diagnosis not present

## 2015-11-11 DIAGNOSIS — I25110 Atherosclerotic heart disease of native coronary artery with unstable angina pectoris: Secondary | ICD-10-CM

## 2015-11-11 DIAGNOSIS — I25118 Atherosclerotic heart disease of native coronary artery with other forms of angina pectoris: Secondary | ICD-10-CM | POA: Diagnosis present

## 2015-11-11 DIAGNOSIS — R079 Chest pain, unspecified: Secondary | ICD-10-CM | POA: Diagnosis not present

## 2015-11-11 DIAGNOSIS — Z8673 Personal history of transient ischemic attack (TIA), and cerebral infarction without residual deficits: Secondary | ICD-10-CM | POA: Diagnosis not present

## 2015-11-11 LAB — CBC
HCT: 36 % — ABNORMAL LOW (ref 39.0–52.0)
Hemoglobin: 12.5 g/dL — ABNORMAL LOW (ref 13.0–17.0)
MCH: 32 pg (ref 26.0–34.0)
MCHC: 34.7 g/dL (ref 30.0–36.0)
MCV: 92.1 fL (ref 78.0–100.0)
Platelets: 136 10*3/uL — ABNORMAL LOW (ref 150–400)
RBC: 3.91 MIL/uL — ABNORMAL LOW (ref 4.22–5.81)
RDW: 12.7 % (ref 11.5–15.5)
WBC: 5.1 10*3/uL (ref 4.0–10.5)

## 2015-11-11 LAB — BASIC METABOLIC PANEL
Anion gap: 8 (ref 5–15)
BUN: 17 mg/dL (ref 6–20)
CO2: 23 mmol/L (ref 22–32)
Calcium: 9.1 mg/dL (ref 8.9–10.3)
Chloride: 106 mmol/L (ref 101–111)
Creatinine, Ser: 1 mg/dL (ref 0.61–1.24)
GFR calc Af Amer: >60 mL/min (ref >60)
GFR calc non Af Amer: >60 mL/min (ref >60)
Glucose, Bld: 101 mg/dL — ABNORMAL HIGH (ref 65–99)
Potassium: 4.3 mmol/L (ref 3.5–5.1)
Sodium: 137 mmol/L (ref 135–145)

## 2015-11-11 LAB — GLUCOSE, CAPILLARY: Glucose-Capillary: 99 mg/dL (ref 65–99)

## 2015-11-11 MED ORDER — MAGNESIUM OXIDE 400 (241.3 MG) MG PO TABS
200.0000 mg | ORAL_TABLET | ORAL | Status: DC
  Administered 2015-11-11: 09:00:00 200 mg via ORAL
  Filled 2015-11-11: qty 1

## 2015-11-11 MED ORDER — CLOPIDOGREL BISULFATE 75 MG PO TABS: 75.0000 mg | ORAL_TABLET | Freq: Every day | ORAL | Status: DC

## 2015-11-11 NOTE — Telephone Encounter (Signed)
Pt was on TCM list admitted for External again, CAD. Pt d/c 11/11/15 will be f/u w/cardiology on 11/23/15...Richard Ellis

## 2015-11-11 NOTE — Discharge Summary (Signed)
Discharge Summary    Patient ID: Richard Ellis,  MRN: [id number], DOB/AGE: 1946/04/09 70 y.o.  Admit date: 11/10/2015 Discharge date: 11/11/2015  Primary Care Provider: [name] Primary Cardiologist: Burt Knack  Discharge Diagnoses    Active Problems:   Hyperlipidemia   Exertional angina [facility])   Coronary artery disease   Allergies Allergies  Allergen Reactions  . Atorvastatin Other (See Comments)    Muscle weakness  . Crestor [Rosuvastatin]     Muscle weakness  . Demerol [Meperidine] Nausea Only  . Niacin And Related Itching and Other (See Comments)    Palms turned red and itched    Diagnostic Studies/Procedures    Conclusion     Prox RCA lesion, 30% stenosed.  Dist RCA lesion, 25% stenosed.  Prox LAD lesion, 50% stenosed.  The left ventricular systolic function is normal.  Mid LAD lesion, 95% stenosed. Post intervention, there is a 0% residual stenosis.  1. Severe single-vessel coronary artery disease with severe stenosis of the mid LAD, treated successfully with DES implantation  2. Mild nonobstructive disease of the right coronary artery, wide patency of the left circumflex, wide patency of the left main  3. Low normal LV systolic function  Recommendations: The patient should be treated with dual antiplatelet therapy for 12 months. Aggressive risk modification is indicated. The patient has been statin intolerant to various agents. He should be referred to the lipid clinic for consideration of a PCSK9 inhibitor. He has resting bradycardia and is not a candidate for a beta-blocker.   Post-Intervention Diagram           _____________   History of Present Illness     Richard Ellis is a 70 y.o. male with a history of PFO, stroke, hyperlipidemia, hypothyroidism, anemia. He presented in 2014 with a cryptogenic stroke and was found to have a PFO with right-to-left shunt. MRI at that time showed a small right occipital lobe cortical infarct. At that  time we elected to treat him medically with clopidogrel. He also has hyperlipidemia and has been statin intolerant. He's been followed in the lipid clinic and he has been treated with Zetia, fish oil, and Krill oil. He has now discontinued Krill oil.   patient reports with complaints of exertional chest pain. 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 He reports going out for a run for about an hour and 15 minutes a little over a week ago and developing chest pain and severe shortness of breath out of character for the amount of exertion. When he stopped running the chest pain eased off. He tried again a week later and within 5 minutes he developed chest pain an shortness of breath. The patient currently denies nausea, vomiting, fever, orthopnea, dizziness, PND, cough, congestion, abdominal pain, hematochezia, melena, lower extremity edema, claudication.  Hospital Course     Consultants: Cardiac Rehab  He underwent LHC revealing prox RCA lesion, 30% stenosed. Dist RCA lesion, 25% stenosed. Prox LAD lesion, 50% stenosed. The left ventricular systolic function is normal. Mid LAD lesion, 95% stenosed. Post intervention with DES, there is a 0% residual stenosis. ASA, plavix, zetia. Statin allergy so we need to consider PCSK9 inhibitor. Recheck lipids in 3 months. Dietary modification discussed. The patient was seen by Dr. Angelena Form who felt he was stable for DC home.     _____________  Discharge Vitals Blood pressure 124/62, pulse 105, temperature 98.4 F (36.9 C), temperature source Oral, resp. rate 14, height 6' 1.5" (1.867  m), weight 191 lb 2.2 oz (86.7 kg), SpO2 98 %.  Filed Weights   11/10/15 1015 11/11/15 0440  Weight: 182 lb (82.555 kg) 191 lb 2.2 oz (86.7 kg)    Labs & Radiologic Studies     CBC  Recent Labs  11/11/15 0545  WBC 5.1  HGB 12.5*  HCT 36.0*  MCV 92.1  PLT [id number]*   Basic Metabolic Panel  Recent Labs  11/11/15 0545  NA 137  K 4.3  CL 106  CO2 23   GLUCOSE 101*  BUN 17  CREATININE 1.00  CALCIUM 9.1    Mr Lumbar Spine Wo Contrast  10/31/2015  CLINICAL DATA:  Left hip and buttock pain. Low back tightness. Duration of symptoms 3 months. EXAM: MRI LUMBAR SPINE WITHOUT CONTRAST TECHNIQUE: Multiplanar, multisequence MR imaging of the lumbar spine was performed. No intravenous contrast was administered. COMPARISON:  Radiography 09/06/2015 FINDINGS: There is no abnormality at L3-4 or above. The discs are normal. The canal and foramina are widely patent. The distal cord and conus are normal with the conus tip at L2. L4-5: Mild bulging of the disc with an annular fissure in the left foraminal to extra foraminal region. Mild facet and ligamentous hypertrophy. Nerve compression is not demonstrated, but this could irritate the left L4 nerve root. L5-S1: Disc degeneration with a shallow broad-based disc herniation with slight caudal down turning. This contacts the S1 nerve roots as they bud from the thecal sac. Neural compression is not demonstrated but this could cause neural irritation. Foramina are sufficiently patent. IMPRESSION: L4-5: Left foraminal to extra foraminal annular fissure that could be associated with irritation of the left L4 nerve root. Nerve compression is not demonstrated. L5-S1: Broad-based disc herniation with caudal down turning contacts the S1 nerve roots as they bud from the thecal sac. Nerve compression is not demonstrated, but this could be associated with nerve irritation. Electronically Signed   By: Nelson Chimes M.D.   On: 10/31/2015 08:08   Mr Hip Left Wo Contrast  10/31/2015  CLINICAL DATA:  Left hip and buttock pain with low back tightness for 3 months. EXAM: MR OF THE LEFT HIP WITHOUT CONTRAST TECHNIQUE: Multiplanar, multisequence MR imaging was performed. No intravenous contrast was administered. COMPARISON:  09/06/2015 ; 11/14/2010 FINDINGS: Bones: As shown on conventional radiographs from 2012, there are small single  bilateral synovial herniation pits along the anterior margin of both femoral necks. There is also spurring along the pubis, faint subcortical edema along the right side of the pubic symphysis. Articular cartilage and labrum Articular cartilage: No loss of cartilage thickness or focal chondral defect observed. Labrum: There is a notable tear of the anterior superior acetabular labrum, images 12 through 14 of series 12. No paralabral cyst identified. Joint or bursal effusion Joint effusion:  Absent Bursae:  No regional bursitis Muscles and tendons Muscles and tendons: There is asymmetric edema and partial tearing of the left proximal hamstring tendon, images 20-13 of series 11 and also shown on images 1-2 of series 8. On image 25 of series 8 there is suspicion frayed tear of the right adductor aponeurosis along the right pubis. Other findings Miscellaneous: There is some indistinct reduced signal in the peripheral zone of the prostate gland, particularly near the prostate apex. Although not entirely specific, as today's exam is not a dedicated prostate MRI, correlate with PSA level in determining whether further prostate workup is warranted. IMPRESSION: 1. Partial tearing and tendinopathy proximally in the left hamstring tendon, asymmetric from the  right. 2. Notable tear of the left anterior superior acetabular labrum. No paralabral cyst. 3. Tear along the adductor aponeurosis attached to the right pubis, as encountered in athletic pubalgia. There is also some degenerative spurring of the pubis. 4. Incidental chronic bilateral synovial herniation pit anteriorly along both femoral necks. 5. Mild T2 heterogeneity in the peripheral zone of the prostate gland. This is nonspecific but I would suggest correlating with a PSA level. Electronically Signed   By: Van Clines M.D.   On: 10/31/2015 07:41    Disposition   Pt is being discharged home today in good condition.  Follow-up Plans & Appointments     Follow-up Information    Follow up with [name], PA-C On 11/23/2015.   Specialties:  Cardiology, Radiology   Why:  10:30 AM   Contact information:   [place] [id number] [phone]      Discharge Instructions    Amb Referral to Cardiac Rehabilitation    Complete by:  As directed   Diagnosis:  PCI     Diet - low sodium heart healthy    Complete by:  As directed      Discharge instructions    Complete by:  As directed   Take it easy for two weeks then start increasing for mileage.     Increase activity slowly    Complete by:  As directed            Discharge Medications   Current Discharge Medication List    START taking these medications   Details  clopidogrel (PLAVIX) 75 MG tablet Take 1 tablet (75 mg total) by mouth daily with breakfast. Qty: 30 tablet, Refills: 11      CONTINUE these medications which have NOT CHANGED   Details  aspirin EC 81 MG tablet Take 1 tablet (81 mg total) by mouth daily.   Associated Diagnoses: PFO (patent foramen ovale); Hyperlipidemia; Dyslipidemia    Cholecalciferol 2000 UNITS CAPS Take 1 capsule by mouth daily.      ezetimibe (ZETIA) 10 MG tablet Take 1 tablet (10 mg total) by mouth daily. Qty: 90 tablet, Refills: 3   Associated Diagnoses: Hyperlipidemia    !! gabapentin (NEURONTIN) 100 MG capsule Take 1 capsule (100 mg total) by mouth 2 (two) times daily. Qty: 60 capsule, Refills: 3    !! gabapentin (NEURONTIN) 300 MG capsule nightly Qty: 30 capsule, Refills: 3    levothyroxine (SYNTHROID, LEVOTHROID) 25 MCG tablet TAKE ONE TABLET BY MOUTH ONE TIME DAILY Qty: 30 tablet, Refills: 11    Magnesium 250 MG TABS Take 250 mg by mouth 3 (three) times a week.     Omega-3 Fatty Acids (FISH OIL) 1000 MG CAPS Take 2,000 mg by mouth 2 (two) times daily.    PROAIR RESPICLICK 123XX123 (90 Base) MCG/ACT AEPB Inhale 2 puffs into the lungs every 4 (four) hours as needed. Wheezing or sob Refills: 0     Umeclidinium-Vilanterol (ANORO ELLIPTA) 62.5-25 MCG/INH AEPB Inhale 1 Act into the lungs daily. Qty: 1 each, Refills: 3     !! - Potential duplicate medications found. Please discuss with provider.    STOP taking these medications     meloxicam (MOBIC) 15 MG tablet          Aspirin prescribed at discharge?  Yes High Intensity Statin Prescribed? (Lipitor 40-80mg  or Crestor 20-40mg ): Yes Beta Blocker Prescribed? No:  Due to Bradycardia For EF 45% or less, Was ACEI/ARB Prescribed? NA ADP Receptor  Inhibitor Prescribed? (i.e. Plavix etc.-Includes Medically Managed Patients): Yes For EF <40%, Aldosterone Inhibitor Prescribed? NA Was EF assessed during THIS hospitalization? Yes Was Cardiac Rehab II ordered? (Included Medically managed Patients): Yes   Outstanding Labs/Studies   Recheck Lipids in 3 months  Duration of Discharge Encounter   Greater than 30 minutes including physician time.  Signed, Alvis Pulcini, [place] PAC 11/11/2015, 9:10 AM

## 2015-11-11 NOTE — Care Management Note (Signed)
Case Management Note  Patient Details  Name: Richard Ellis MRN: [id number] Date of Birth: 04-Jun-1946  Subjective/Objective:     Patient is for discharge today,ambulated with cardiac rehab, on plavix, no other needs.               Action/Plan:   Expected Discharge Date:                  Expected Discharge Plan:  Home/Self Care  In-House Referral:     Discharge planning Services  CM Consult  Post Acute Care Choice:    Choice offered to:     DME Arranged:    DME Agency:     HH Arranged:    [place] Agency:     Status of Service:  Completed, signed off  Medicare Important Message Given:    Date Medicare IM Given:    Medicare IM give by:    Date Additional Medicare IM Given:    Additional Medicare Important Message give by:     If discussed at [place] of Stay Meetings, dates discussed:    Additional Comments:  Zenon Mayo, RN 11/11/2015, 10:54 AM

## 2015-11-11 NOTE — Progress Notes (Signed)
    Subjective: No complaints  Objective: Vital signs in last 24 hours: Temp:  [98 F (36.7 C)-98.4 F (36.9 C)] 98.4 F (36.9 C) (02/24 0816) Pulse Rate:  [39-105] 105 (02/24 0816) Resp:  [10-26] 14 (02/24 0816) BP: (104-144)/(58-79) 124/62 mmHg (02/24 0816) SpO2:  [0 %-100 %] 98 % (02/24 0816) Weight:  [182 lb (82.555 kg)-191 lb 2.2 oz (86.7 kg)] 191 lb 2.2 oz (86.7 kg) (02/24 0440) Last BM Date: 11/09/15  Intake/Output from previous day: 02/23 0701 - 02/24 0700 In: 425 [I.V.:425] Out: -  Intake/Output this shift:    Medications Scheduled Meds: . aspirin EC  81 mg Oral Daily  . cholecalciferol  1,000 Units Oral Daily  . clopidogrel  75 mg Oral Q breakfast  . ezetimibe  10 mg Oral Daily  . gabapentin  100 mg Oral BID  . gabapentin  300 mg Oral QHS  . levothyroxine  25 mcg Oral Q breakfast  . magnesium oxide  200 mg Oral Q M,W,F  . sodium chloride flush  3 mL Intravenous Q12H   Continuous Infusions:  PRN Meds:.sodium chloride, acetaminophen, albuterol, ondansetron (ZOFRAN) IV, oxyCODONE-acetaminophen, sodium chloride flush  PE: General appearance: alert, cooperative and no distress Lungs: clear to auscultation bilaterally Heart: reg rhythm, rate slow Extremities: No LEE Pulses: 2+ and symmetric Skin: Warm and dry, right radial cath site stable Neurologic: Grossly normal  Lab Results:   Recent Labs  11/11/15 0545  WBC 5.1  HGB 12.5*  HCT 36.0*  PLT 136*   BMET  Recent Labs  11/11/15 0545  NA 137  K 4.3  CL 106  CO2 23  GLUCOSE 101*  BUN 17  CREATININE 1.00  CALCIUM 9.1   Lipid Panel     Component Value Date/Time   CHOL 241* 11/07/2015 0811   CHOL 230* 11/09/2014 1200   TRIG 261* 11/07/2015 0811   TRIG 152* 11/09/2014 1200   TRIG 228* 07/30/2006 0741   HDL 45 11/07/2015 0811   HDL 64 11/09/2014 1200   CHOLHDL 5.4* 11/07/2015 0811   CHOLHDL 5.5 CALC 07/30/2006 0741   VLDL 52* 11/07/2015 0811   LDLCALC 144* 11/07/2015 0811   LDLCALC  136* 11/09/2014 1200   LDLDIRECT 122.0 05/09/2015 1006   LDLDIRECT 120.3 07/30/2006 0741     Assessment/Plan   Active Problems:   Hyperlipidemia   Exertional angina ([facility])   Coronary artery disease  70 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  He underwent LHC revealing prox RCA lesion, 30% stenosed. Dist RCA lesion, 25% stenosed. Prox LAD lesion, 50% stenosed. The left ventricular systolic function is normal. Mid LAD lesion, 95% stenosed. Post intervention with DES, there is a 0% residual stenosis.  ASA, plavix, zetia.  Statin allergy so we need to consider PCSK9 inhibitor.  Recheck lipids in 3 months.   Dietary modification discussed.      Tarri Fuller PA-C 11/11/2015 8:20 AM  I have personally seen and examined this patient with Tarri Fuller, PA-C. I agree with the assessment and plan as outlined above. He is here with unstable angina. Cath per Dr. Burt Knack with severe LAD stenosis. Now s/p DES x 1 mid LAD. He is doing well this am. D/c home today. Follow up dr. Burt Knack.   Jet Armbrust 11/11/2015 9:15 AM

## 2015-11-11 NOTE — Progress Notes (Signed)
CARDIAC REHAB PHASE I   PRE:  Rate/Rhythm: 44 SB  BP:  Supine:   Sitting:   Standing: 124/62   SaO2:   MODE:  Ambulation: 1000 ft   POST:  Rate/Rhythm: 77 SR  BP:  Supine:   Sitting:   Standing: 122/67   SaO2:  0800-0853 Pt walked 1000 ft with steady gait. Tolerated well. Education completed with pt who voiced understanding. Stressed importance of plavix with stent. Discussed CRP 2 and will refer to [place]. Reviewed NTG use. Pt stated he probably would not take. Instructed to call 911 with CP then.   Graylon Good, RN BSN  11/11/2015 8:51 AM

## 2015-11-15 ENCOUNTER — Other Ambulatory Visit: Payer: Self-pay | Admitting: Cardiovascular Disease

## 2015-11-23 ENCOUNTER — Encounter: Payer: Self-pay | Admitting: Family Medicine

## 2015-11-23 ENCOUNTER — Encounter: Payer: Self-pay | Admitting: Cardiology

## 2015-11-23 ENCOUNTER — Ambulatory Visit (INDEPENDENT_AMBULATORY_CARE_PROVIDER_SITE_OTHER): Payer: Federal, State, Local not specified - PPO | Admitting: Cardiology

## 2015-11-23 VITALS — BP 118/78 | HR 50 | Ht 73.5 in | Wt 186.8 lb

## 2015-11-23 DIAGNOSIS — M5416 Radiculopathy, lumbar region: Secondary | ICD-10-CM

## 2015-11-23 DIAGNOSIS — I25810 Atherosclerosis of coronary artery bypass graft(s) without angina pectoris: Secondary | ICD-10-CM

## 2015-11-23 NOTE — Patient Instructions (Signed)
Medication Instructions:  Your physician recommends that you continue on your current medications as directed. Please refer to the Current Medication list given to you today.   Labwork: None ordered  Testing/Procedures: None ordered  Follow-Up: Your physician recommends that you schedule a follow-up appointment in: 4 months with Dr.Cooper  You have been referred to Lipid Clinic    Any Other Special Instructions Will Be Listed Below (If Applicable).     If you need a refill on your cardiac medications before your next appointment, please call your pharmacy.

## 2015-11-23 NOTE — Progress Notes (Signed)
11/23/2015 Richard Ellis   [birth year]  [id number]  Primary Physician [name], MD Primary Cardiologist: Dr. Burt Knack   Reason for Visit/CC: [facility] F/u for CAD  HPI:  Richard Ellis is a 70 y.o. male with a history of PFO, stroke, hyperlipidemia, hypothyroidism and anemia. He presented in 2014 with a cryptogenic stroke and was found to have a PFO with right-to-left shunt. MRI at that time showed a small right occipital lobe cortical infarct. At that time we elected to treat him medically with clopidogrel. He also has hyperlipidemia and has been statin intolerant. He's been followed in the lipid clinic and he has been treated with Zetia, fish oil, and Krill oil. hsi recent lipid panel showed elevated LDL of 144 mg/dL.  He is a competitive runner and recently developed exertional angina with running, described as chest pressure/tightness. Subsequently, he was set up for outpatient LHC. This was performed by Dr. Burt Knack. He was found to have severe single-vessel coronary artery disease with severe stenosis of the mid LAD, treated successfully with DES implantation. There was mild nonobstructive disease of the right coronary artery, wide patency of the left circumflex, wide patency of the left main and low normal LV systolic function with an estimated EF of 50-55%. He tolerated the procedure well and was placed on DAPT with ASA + Plavix. No BB due to resting bradycardia, HR in the 50s which is his normal. Also with controlled BP, thus no addition of ACE/ARB.   He now presents back for f/u. He reports that he has done well. He denies recurrent CP. He has returned to running, up to 2 miles and denies any recurrent exertional angina. No dyspnea. He has been fully compliant with dual antiplatelet therapy. He denies any abnormal bleeding with aspirin and Plavix. Radial cath site is well-healed without complications. Pulse rate is in the 50s but he is asymptomatic. He has a 10 K scheduled for  12/10/2015 and is hoping he would be able to compete.   Current Outpatient Prescriptions  Medication Sig Dispense Refill  . aspirin EC 81 MG tablet Take 1 tablet (81 mg total) by mouth daily.    . Cholecalciferol 2000 UNITS CAPS Take 1 capsule by mouth daily.      . clopidogrel (PLAVIX) 75 MG tablet Take 1 tablet (75 mg total) by mouth daily with breakfast. 30 tablet 11  . gabapentin (NEURONTIN) 100 MG capsule Take 1 capsule (100 mg total) by mouth 2 (two) times daily. 60 capsule 3  . gabapentin (NEURONTIN) 300 MG capsule nightly (Patient taking differently: Take 300 mg by mouth at bedtime. nightly) 30 capsule 3  . levothyroxine (SYNTHROID, LEVOTHROID) 25 MCG tablet TAKE ONE TABLET BY MOUTH ONE TIME DAILY 30 tablet 11  . Magnesium 250 MG TABS Take 250 mg by mouth 3 (three) times a week.     . Omega-3 Fatty Acids (FISH OIL) 1000 MG CAPS Take 2,000 mg by mouth 2 (two) times daily.    [name] PROAIR RESPICLICK 123XX123 (90 Base) MCG/ACT AEPB Inhale 2 puffs into the lungs every 4 (four) hours as needed. Wheezing or sob  0  . Umeclidinium-Vilanterol (ANORO ELLIPTA) 62.5-25 MCG/INH AEPB Inhale 1 Act into the lungs daily. 1 each 3  . ZETIA 10 MG tablet TAKE 1 TABLET DAILY 90 tablet 0   No current facility-administered medications for this visit.    Allergies  Allergen Reactions  . Atorvastatin Other (See Comments)    Muscle weakness  . Crestor [Rosuvastatin]  Muscle weakness  . Demerol [Meperidine] Nausea Only  . Niacin And Related Itching, Rash and Other (See Comments)    Palms turned red and itched    Social History   Social History  . Marital Status: Married    Spouse Name: N/A  . Number of Children: N/A  . Years of Education: N/A   Occupational History  . Not on file.   Social History Main Topics  . Smoking status: Never Smoker   . Smokeless tobacco: Never Used  . Alcohol Use: 4.8 oz/week    4 Cans of beer, 4 Glasses of wine per week     Comment: 07/02/2013 "glass of wine or a  beer most nights of the week"  . Drug Use: No  . Sexual Activity: Yes   Other Topics Concern  . Not on file   Social History Narrative     Review of Systems: General: negative for chills, fever, night sweats or weight changes.  Cardiovascular: negative for chest pain, dyspnea on exertion, edema, orthopnea, palpitations, paroxysmal nocturnal dyspnea or shortness of breath Dermatological: negative for rash Respiratory: negative for cough or wheezing Urologic: negative for hematuria Abdominal: negative for nausea, vomiting, diarrhea, bright red blood per rectum, melena, or hematemesis Neurologic: negative for visual changes, syncope, or dizziness All other systems reviewed and are otherwise negative except as noted above.    Blood pressure 118/78, pulse 50, height 6' 1.5" (1.867 m), weight 186 lb 12.8 oz (84.732 kg), SpO2 98 %.  General appearance: alert, cooperative and no distress Neck: no carotid bruit and no JVD Lungs: clear to auscultation bilaterally Heart: regular rate and rhythm, S1, S2 normal, no murmur, click, rub or gallop Extremities: no LEE Pulses: 2+ and symmetric Skin: warm and dry Neurologic: Grossly normal  EKG not performed  ASSESSMENT AND PLAN:   1. CAD: s/p PCI + DES to mid LAD. No significant residual CAD. Stable without recurrent exertional angina. Continue dual antiplatelet therapy with aspirin and Plavix were a minimum of one year given recent drug-eluting stent placement. He has been intolerant to statin therapy. No beta blocker given a slime bradycardia with heart rate in 50s. His blood pressure is well-controlled at 118/78 without any medications. I've discussed with Dr. Burt Knack the patient's desire to compete in a 10K in 3 weeks. It is okay for the patient to compete however it was made clear to him that Dr. Antionette Char recommendation was for him to take it lightly and to not push himself too far. He should not be pushing himself to the max, in an effort to  win the race. He understands this.   2. Hyperlipidemia: Recent fasting lipid panel showed an elevated LDL of 144 mg/dL. He has been intolerant to statin therapy. Given his CAD, his goal LDL is less than 70 mg/dL. Given that he has been intolerant to statin therapies due to side effects, we will refer him to our lipid clinic for evaluation for potential initiation of PCSK  inhibitor therapy.  3. Sinus bradycardia: Pulse rate is in the 50s. This is his baseline. He is a competitive runner, thus lower resting HR is to be expected. He is asymptomatic. He is on no AV nodal blocking agents.  PLAN  F/u with Dr. Burt Knack in 2-3 months.   [name] PA-C 11/23/2015 10:42 AM

## 2015-11-24 ENCOUNTER — Other Ambulatory Visit: Payer: Self-pay

## 2015-12-02 ENCOUNTER — Telehealth: Payer: Self-pay

## 2015-12-02 NOTE — Telephone Encounter (Signed)
LVM for pt to call back as soon as possible.   Re: Flu Vaccine 2016-2017 

## 2015-12-02 NOTE — Telephone Encounter (Signed)
Documented in pt chart.

## 2015-12-02 NOTE — Telephone Encounter (Signed)
Got flu shot at [facility] 08/2015.

## 2015-12-07 ENCOUNTER — Telehealth: Payer: Self-pay | Admitting: Cardiovascular Disease

## 2015-12-07 NOTE — Telephone Encounter (Signed)
Referral appt was not made by [facility] after his last appointment with PA.  Called patient.  Made an appt for Monday 3/27.

## 2015-12-07 NOTE — Telephone Encounter (Signed)
Pt called to say he was to get a call from [facility] re an injection-has heard wanted to fu   pls call [phone]

## 2015-12-11 ENCOUNTER — Encounter: Payer: Self-pay | Admitting: Cardiovascular Disease

## 2015-12-11 NOTE — Progress Notes (Signed)
HPI [name] is a 70 yo WM pt of Dr. Burt Knack that was referred to the lipid clinic due to an elevated lipid panel that is trending upward over the past year.  He has a PMH significant for CAD s/p PCI to LAD in February 2017 and stroke related to PFO.   He does have a family history of heart disease: his father died of a heart attack at the age of 27; he uncle had a triple bypass and died before the age of 68; and his grandma died of a stroke at the age of 55.    Pt has tried several therapies in the past.  He failed atorvastatin and Crestor due to muscle pains and weakness developing even while taking CoQ10. The pt has an allergy due to niacin due his palms turning red and starting to itch. He is currently taking Zetia 10mg  daily and fish oil 4gm daily  The pt has a relatively healthy diet.  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 He currently has a herniated disk that is causing him some pain.   RF: CAD, Family history, age  Goals: LDL <70  Current Meds: Zetia 10mg  daily, fish oil 4 gm daily Intolerances: Crestor 5mg  three days of the week(myalgias), atorvastatin (myalgias), Niacin (flushing and itching)  Labs 11/07/15- TC 241, TG 261, HDL 45, LDL 144 , AST 27, ALT 23 (Zetia 10mg  daily and Fish oil 4gm daily)  Current Outpatient Prescriptions  Medication Sig Dispense Refill  . aspirin EC 81 MG tablet Take 1 tablet (81 mg total) by mouth daily.    . Cholecalciferol 2000 UNITS CAPS Take 1 capsule by mouth daily.      . clopidogrel (PLAVIX) 75 MG tablet Take 1 tablet (75 mg total) by mouth daily with breakfast. 30 tablet 11  . gabapentin (NEURONTIN) 100 MG capsule Take 1 capsule (100 mg total) by mouth 2 (two) times daily. 60 capsule 3  . gabapentin (NEURONTIN) 300 MG capsule nightly (Patient taking differently: Take 300 mg by mouth at bedtime. nightly) 30 capsule 3  . levothyroxine (SYNTHROID, LEVOTHROID) 25  MCG tablet TAKE ONE TABLET BY MOUTH ONE TIME DAILY 30 tablet 11  . Magnesium 250 MG TABS Take 250 mg by mouth 3 (three) times a week.     . Omega-3 Fatty Acids (FISH OIL) 1000 MG CAPS Take 2,000 mg by mouth 2 (two) times daily.    [name] PROAIR RESPICLICK 123XX123 (90 Base) MCG/ACT AEPB Inhale 2 puffs into the lungs every 4 (four) hours as needed. Wheezing or sob  0  . Umeclidinium-Vilanterol (ANORO ELLIPTA) 62.5-25 MCG/INH AEPB Inhale 1 Act into the lungs daily. 1 each 3  . ZETIA 10 MG tablet TAKE 1 TABLET DAILY 90 tablet 0   No current facility-administered medications for this visit.   Allergies  Allergen Reactions  . Atorvastatin Other (See Comments)    Muscle weakness  . Crestor [Rosuvastatin]     Muscle weakness  . Demerol [Meperidine] Nausea Only  . Niacin And Related Itching, Rash and Other (See Comments)    Palms turned red and itched   Assessment and Plan 1. Hyperlipidemia- Pt's LDL increased on Zetia and fish oil.  Given his recent PCI, need to aggressively try to treat LDL to goal of < 70 mg/dL.  He has failed multiple statins, including low dose Crestor.  Best option for 50% reduction in LDL at  this time would be PCSK-9 inhibitor.  Will submit paperwork for PA.

## 2015-12-12 ENCOUNTER — Ambulatory Visit (INDEPENDENT_AMBULATORY_CARE_PROVIDER_SITE_OTHER): Payer: Federal, State, Local not specified - PPO | Admitting: Pharmacist

## 2015-12-12 DIAGNOSIS — E785 Hyperlipidemia, unspecified: Secondary | ICD-10-CM | POA: Diagnosis not present

## 2015-12-19 ENCOUNTER — Encounter: Payer: Self-pay | Admitting: Pharmacist

## 2015-12-22 ENCOUNTER — Encounter: Payer: Self-pay | Admitting: Pharmacist

## 2016-01-02 ENCOUNTER — Encounter: Payer: Self-pay | Admitting: Pharmacist

## 2016-01-02 ENCOUNTER — Encounter: Payer: Self-pay | Admitting: Family Medicine

## 2016-01-02 NOTE — Telephone Encounter (Signed)
This encounter was created in error - please disregard.

## 2016-01-03 ENCOUNTER — Telehealth: Payer: Self-pay | Admitting: Pharmacist

## 2016-01-03 MED ORDER — EVOLOCUMAB 140 MG/ML ~~LOC~~ SOAJ
140.0000 mg | SUBCUTANEOUS | Status: DC
Start: 2016-01-03 — End: 2016-05-10

## 2016-01-03 NOTE — Telephone Encounter (Signed)
Received message from [employer] that insurance prefers [employer] over [employer].  Was given number of [phone] to call and give okay to change.  LM that it is okay to change from [place] to [place] as patient has not started therapy yet.  Will send in new Rx to [facility].

## 2016-01-04 ENCOUNTER — Other Ambulatory Visit: Payer: Self-pay | Admitting: Internal Medicine

## 2016-01-09 ENCOUNTER — Encounter: Payer: Self-pay | Admitting: Pharmacist

## 2016-01-16 ENCOUNTER — Encounter: Payer: Self-pay | Admitting: Pharmacist

## 2016-01-31 ENCOUNTER — Other Ambulatory Visit: Payer: Self-pay | Admitting: Internal Medicine

## 2016-02-02 ENCOUNTER — Encounter: Payer: Self-pay | Admitting: Family Medicine

## 2016-02-02 ENCOUNTER — Other Ambulatory Visit: Payer: Self-pay | Admitting: Family Medicine

## 2016-02-02 ENCOUNTER — Ambulatory Visit (INDEPENDENT_AMBULATORY_CARE_PROVIDER_SITE_OTHER): Payer: Federal, State, Local not specified - PPO | Admitting: Family Medicine

## 2016-02-02 VITALS — BP 120/68 | HR 53 | Ht 73.5 in | Wt 187.0 lb

## 2016-02-02 DIAGNOSIS — M5416 Radiculopathy, lumbar region: Secondary | ICD-10-CM | POA: Diagnosis not present

## 2016-02-02 DIAGNOSIS — M169 Osteoarthritis of hip, unspecified: Secondary | ICD-10-CM

## 2016-02-02 DIAGNOSIS — M24159 Other articular cartilage disorders, unspecified hip: Secondary | ICD-10-CM

## 2016-02-02 MED ORDER — PREDNISONE 50 MG PO TABS: 50.0000 mg | ORAL_TABLET | Freq: Every day | ORAL | Status: DC

## 2016-02-02 NOTE — Progress Notes (Signed)
[name] [address], [place] [address] Phone: [phone] Subjective:       CC: neck pain follow-up  [id number] Richard Ellis is a 70 y.o. male coming in for followup of hip/back pain. Been 2 months since we seen patient. Patient presentation less than was more concerning for a lumbar radiculopathy. Not responding to the conservative therapy at this time. Was worsening. Was sent for an MRI. This was independently visualized by me. Patient did have a L4-L5 left foraminal annular fissure with irritation of the left L4 nerve root as well as a broad based disc herniation impinging on the S1 nerve root on the left side. Patient was to be scheduled for an epidural but never had this done. Patient recently has been on Plavix and has not been taken off the medication. Concerned about taking it off and wants to discuss it with his cardiologist. Continues to have some radicular symptoms and is having some cramping in his calf. Thinks it could be related. Has stopped the medial and from running sometimes. States when he does run he usually hurts afterwards more than during the running itself.  Patient was also sent for an MRI of the left hip. Patient's left hip was also independently visualized. This showed the patient did have a anterior superior acetabular labrum tear as well as some very mild tearing of the left hamstring.  Patient went to another provider and did have stem cell injections.patient states that it was feeling quite good for the last 3 weeks but is starting have some mild discomfort again. Not concerned overall but states whenever he tries to do speed workouts it seems to get a little bit worse.  Past Medical History  Diagnosis Date  . Hyperlipidemia   . Allergy     rhinitis  . Bronchitis   . Hypothyroidism   . Heart murmur     "as a child" (07/02/2013)  . Chronic bronchitis ([place])     "q year" (07/02/2013)  . Anemia     "Hgb always on  the low side" (07/02/2013)  . Esophageal stricture   . Stroke ([place]) 07/02/2013    "small ischemic occipital right sided" (07/02/2013)  . Osteoarthritis     "maybe a little bit in my spine" (07/02/2013)  . PFO (patent foramen ovale)     with right to left shunting   Past Surgical History  Procedure Laterality Date  . Esophagogastroduodenoscopy N/A 10/26/2012    Procedure: ESOPHAGOGASTRODUODENOSCOPY (EGD);  Surgeon: Winfield Cunas., MD;  Location: [facility] ENDOSCOPY;  Service: Endoscopy;  Laterality: N/A;  . Tonsillectomy  1950's  . Esophagogastroduodenoscopy N/A 07/08/2013    Procedure: ESOPHAGOGASTRODUODENOSCOPY (EGD);  Surgeon: Winfield Cunas., MD;  Location: [facility] ENDOSCOPY;  Service: Endoscopy;  Laterality: N/A;  . Savory dilation N/A 07/08/2013    Procedure: SAVORY DILATION;  Surgeon: Winfield Cunas., MD;  Location: [facility] ENDOSCOPY;  Service: Endoscopy;  Laterality: N/A;  . Tee without cardioversion N/A 07/28/2013    Procedure: TRANSESOPHAGEAL ECHOCARDIOGRAM (TEE);  Surgeon: Pixie Casino, MD;  Location: [facility] ENDOSCOPY;  Service: Cardiovascular;  Laterality: N/A;  . Cardiac catheterization N/A 11/10/2015    Procedure: Left Heart Cath and Coronary Angiography;  Surgeon: [name], MD;  Location: [facility] CV LAB;  Service: Cardiovascular;  Laterality: N/A;  . Cardiac catheterization N/A 11/10/2015    Procedure: Coronary Stent Intervention;  Surgeon: [name], MD;  Location: [place] CV LAB;  Service: Cardiovascular;  Laterality: N/A;  mid lad  3.0x18 resolute   Social History  Substance Use Topics  . Smoking status: Never Smoker   . Smokeless tobacco: Never Used  . Alcohol Use: 4.8 oz/week    4 Cans of beer, 4 Glasses of wine per week     Comment: 07/02/2013 "glass of wine or a beer most nights of the week"   Allergies  Allergen Reactions  . Atorvastatin Other (See Comments)    Muscle weakness  . Crestor [Rosuvastatin]     Muscle weakness  . Demerol [Meperidine]  Nausea Only  . Niacin And Related Itching, Rash and Other (See Comments)    Palms turned red and itched   Family History  Problem Relation Age of Onset  . Coronary artery disease Other     male first degree relative <60  . Heart disease Father     heart attack  . Colon cancer Neg Hx           Past medical history, social, surgical and family history all reviewed in electronic medical record.   Review of Systems: No headache, visual changes, nausea, vomiting, diarrhea, constipation, dizziness, abdominal pain, skin rash, fevers, chills, night sweats, weight loss, swollen lymph nodes, body aches, joint swelling, muscle aches, chest pain, shortness of breath, mood changes.   Objective Blood pressure 120/68, pulse 53, height 6' 1.5" (1.867 m), weight 187 lb (84.823 kg), SpO2 95 %.  General: No apparent distress alert and oriented x3 mood and affect normal, dressed appropriately.  HEENT: Pupils equal, extraocular movements intact  Respiratory: Patient's speak in full sentences and does not appear short of breath  Cardiovascular: No lower extremity edema, non tender, no erythema  Skin: Warm dry intact with no signs of infection or rash on extremities or on axial skeleton.  Abdomen: Soft nontender  Neuro: Cranial nerves II through XII are intact, neurovascularly intact in all extremities with 2+ DTRs and 2+ pulses.  Lymph: No lymphadenopathy of posterior or anterior cervical chain or axillae bilaterally.  Gait normal with good balance and coordination.  MSK:  Non tender with full range of motion and good stability and symmetric strength and tone of shoulders, elbows, wrist,  knee and ankles bilaterally.  Hip: Left ROM IR: 20 Deg, ER: 45 Deg, Flexion: 120 Deg, Extension: 100 Deg, Abduction: 45 Deg, Adduction: 25 Deg Strength IR: 5/5, ER: 5/5, Flexion: 5/5, Extension: 5/5, Abduction: 4/5, Adduction: 5/5 Pelvic alignment unremarkable to inspection and palpation. Standing hip rotation  and gait without trendelenburg sign / unsteadiness. Greater trochanter without tenderness to palpation. No tenderness over piriformis and greater trochanter. No pain with FABER or FADIR. No SI joint tenderness and normal minimal SI movement. Continued positive straight leg test with radicular symptoms an L4 distribution. Minimal discomfort to palpation in the paraspinal musculature of the lumbar spine on the left side Hip exam better but patient's back exam still stable.      Impression and Recommendations:     This case required medical decision making of moderate complexity.

## 2016-02-02 NOTE — Progress Notes (Signed)
Pre visit review using our clinic review tool, if applicable. No additional management support is needed unless otherwise documented below in the visit note. 

## 2016-02-02 NOTE — Assessment & Plan Note (Signed)
Patient's MRI as well as patient's physical findings is consistent with an L4 nerve root impingement. I really believe that this is what is continuing to most of his pain down the leg as well as likely the cramping. Discussed with patient at great length about the possibility of an epidural. Patient will contact his cardiologist and we'll discuss further to see if he could hold Plavix long enough to have the epidural done. If so we will schedule this. We discussed icing regimen.patient will continue the treatment options to treat patient instructions for further evaluation. Patient and will come back more as needed basis.  Spent  25 minutes with patient face-to-face and had greater than 50% of counseling including as described above in assessment and plan.

## 2016-02-02 NOTE — Assessment & Plan Note (Addendum)
Lotensin stem cell injection from an outside facility. Having some good results. Encourage him to continue to increase his activity as tolerated. If worsening symptoms more likely surgical intervention would be necessary or we can try PRP injections.  Spent  25 minutes with patient face-to-face and had greater than 50% of counseling including as described above in assessment and plan.

## 2016-02-02 NOTE — Telephone Encounter (Signed)
refiill done.

## 2016-02-02 NOTE — Patient Instructions (Signed)
Good to see you  Ice back after running Prednisone daily for 5 days Gabapentin 100mg  at night Iron 65mg  daily or 3 times a week with 500mg  of vitamin C.  OK to run.  Ask cardiologist about the plavix and see if we can stop it for the epidural.  Send me a message in the next week and tell me how you are doing.

## 2016-02-03 ENCOUNTER — Encounter: Payer: Self-pay | Admitting: Cardiovascular Disease

## 2016-02-03 ENCOUNTER — Encounter: Payer: Self-pay | Admitting: Family Medicine

## 2016-02-25 ENCOUNTER — Encounter: Payer: Self-pay | Admitting: Family Medicine

## 2016-02-25 ENCOUNTER — Encounter: Payer: Self-pay | Admitting: Pharmacist

## 2016-02-29 ENCOUNTER — Other Ambulatory Visit: Payer: Self-pay | Admitting: Internal Medicine

## 2016-03-05 ENCOUNTER — Telehealth: Payer: Self-pay | Admitting: Internal Medicine

## 2016-03-05 MED ORDER — LEVOTHYROXINE SODIUM 25 MCG PO TABS: 25.0000 ug | ORAL_TABLET | Freq: Every day | ORAL | Status: DC

## 2016-03-05 NOTE — Telephone Encounter (Signed)
Rx sent for 30 day supply. Last OV with PCP was 12/2014. Please schedule Wellness exam with PCP.

## 2016-03-05 NOTE — Telephone Encounter (Signed)
Patient is calling for a refill of levothyroxine (SYNTHROID, LEVOTHROID) 25 MCG tablet [id number]  [facility] [address]

## 2016-03-06 NOTE — Telephone Encounter (Signed)
mychart message sent

## 2016-03-07 NOTE — Progress Notes (Signed)
Cardiology Office Note:    Date:  03/08/2016   ID:  Richard Ellis, DOB April 27, 1946, MRN [id number]  PCP:  [name], MD  Cardiologist:  Dr. [name]   Electrophysiologist:  n/a  Referring MD: Cassandria Anger, MD   Chief Complaint  Patient presents with  . Coronary Artery Disease    follow up    History of Present Illness:     Richard Ellis is a 70 y.o. male with a hx of CAD, PFO, prior CVA, HL, hypothyroidism, anemia. He presented in 2014 with cryptogenic CVA. He was noted to have PFO with right to left shunt. He was treated medically with Plavix. He is statin intolerant. He developed exertional angina while running earlier this year. LHC demonstrated severe mid LAD stenosis treated with DES. EF is preserved. He was last seen in this office by [name], PA-C in 3/17.  He is followed in our [facility] and is on [place].  He returns for FU.    He is overall doing well.  He still runs 2 x a week (4-7 miles).  He has a lumbar HNP that limits his running. He denies any recurrent angina.  He notes presence of atypical sensation in his chest since his PCI.  It is fairly infrequent and seems to occur with positional changes.  He denies dyspnea, orthopnea, PND, edema, syncope.     Past Medical History  Diagnosis Date  . Hyperlipidemia   . Allergy     rhinitis  . Bronchitis   . Hypothyroidism   . Heart murmur     "as a child" (07/02/2013)  . Chronic bronchitis ([place])     "q year" (07/02/2013)  . Anemia     "Hgb always on the low side" (07/02/2013)  . Esophageal stricture   . Cryptogenic stroke ([place]) 07/02/2013    a. "small ischemic occipital right sided" (07/02/2013)  //  b. Event monitor 10/14: sinus brady  //  c. Carotid US 10/14: bilat ICA 1-39%  . Osteoarthritis     "maybe a little bit in my spine" (07/02/2013)  . PFO (patent foramen ovale)     a. TEE 11/14: mild LVH, EF 60-65%, small PFO, R-L shunt  . CAD (coronary artery disease)     a. LHC 2/17: pLAD  50, mLAD 95, pRCA 30, dRCA 25, EF 50-55% >> PCI:  3 x 18 mm Resolute DES to mLAD   . History of echocardiogram     a. Echo 10/14: EF 60-65%, no RWMA, normal diastolic function    Past Surgical History  Procedure Laterality Date  . Esophagogastroduodenoscopy N/A 10/26/2012    Procedure: ESOPHAGOGASTRODUODENOSCOPY (EGD);  Surgeon: Winfield Cunas., MD;  Location: [facility] ENDOSCOPY;  Service: Endoscopy;  Laterality: N/A;  . Tonsillectomy  1950's  . Esophagogastroduodenoscopy N/A 07/08/2013    Procedure: ESOPHAGOGASTRODUODENOSCOPY (EGD);  Surgeon: Winfield Cunas., MD;  Location: [facility] ENDOSCOPY;  Service: Endoscopy;  Laterality: N/A;  . Savory dilation N/A 07/08/2013    Procedure: SAVORY DILATION;  Surgeon: Winfield Cunas., MD;  Location: [facility] ENDOSCOPY;  Service: Endoscopy;  Laterality: N/A;  . Tee without cardioversion N/A 07/28/2013    Procedure: TRANSESOPHAGEAL ECHOCARDIOGRAM (TEE);  Surgeon: Pixie Casino, MD;  Location: [facility] ENDOSCOPY;  Service: Cardiovascular;  Laterality: N/A;  . Cardiac catheterization N/A 11/10/2015    Procedure: Left Heart Cath and Coronary Angiography;  Surgeon: [name], MD;  Location: [facility] CV LAB;  Service: Cardiovascular;  Laterality: N/A;  .  Cardiac catheterization N/A 11/10/2015    Procedure: Coronary Stent Intervention;  Surgeon: [name], MD;  Location: [place] CV LAB;  Service: Cardiovascular;  Laterality: N/A;  mid lad  3.0x18 resolute    Current Medications: Outpatient Prescriptions Prior to Visit  Medication Sig Dispense Refill  . aspirin EC 81 MG tablet Take 1 tablet (81 mg total) by mouth daily.    . Cholecalciferol 2000 UNITS CAPS Take 1 capsule by mouth daily.      . clopidogrel (PLAVIX) 75 MG tablet Take 1 tablet (75 mg total) by mouth daily with breakfast. 30 tablet 11  . Evolocumab (REPATHA SURECLICK) XX123456 MG/ML SOAJ Inject 140 mg into the skin every 14 (fourteen) days. 2 pen 6  . levothyroxine (SYNTHROID, LEVOTHROID) 25 MCG  tablet Take 1 tablet (25 mcg total) by mouth daily before breakfast. 30 tablet 0  . Omega-3 Fatty Acids (FISH OIL) 1000 MG CAPS Take 2,000 mg by mouth 2 (two) times daily.    [name] PROAIR RESPICLICK 123XX123 (90 Base) MCG/ACT AEPB Inhale 2 puffs into the lungs every 4 (four) hours as needed. Wheezing or sob  0  . gabapentin (NEURONTIN) 100 MG capsule TAKE ONE CAPSULE BY MOUTH TWICE A DAY (Patient not taking: Reported on 03/08/2016) 60 capsule 6  . predniSONE (DELTASONE) 50 MG tablet Take 1 tablet (50 mg total) by mouth daily. (Patient not taking: Reported on 03/08/2016) 5 tablet 0  . Umeclidinium-Vilanterol (ANORO ELLIPTA) 62.5-25 MCG/INH AEPB Inhale 1 Act into the lungs daily. (Patient not taking: Reported on 03/08/2016) 1 each 3  . ZETIA 10 MG tablet TAKE 1 TABLET DAILY (Patient not taking: Reported on 03/08/2016) 90 tablet 0   No facility-administered medications prior to visit.      Allergies:   Atorvastatin; Crestor; Demerol; and Niacin and related   Social History   Social History  . Marital Status: Married    Spouse Name: N/A  . Number of Children: N/A  . Years of Education: N/A   Social History Main Topics  . Smoking status: Never Smoker   . Smokeless tobacco: Never Used  . Alcohol Use: 4.8 oz/week    4 Cans of beer, 4 Glasses of wine per week     Comment: 07/02/2013 "glass of wine or a beer most nights of the week"  . Drug Use: No  . Sexual Activity: Yes   Other Topics Concern  . None   Social History Narrative     Family History:  The patient's family history includes Coronary artery disease in his other; Heart disease in his father. There is no history of Colon cancer.   ROS:   Please see the history of present illness.    ROS All other systems reviewed and are negative.   Physical Exam:    VS:  BP 118/70 mmHg  Pulse 48  Ht 6' 1.5" (1.867 m)  Wt 185 lb (83.915 kg)  BMI 24.07 kg/m2   Physical Exam  Constitutional: He is oriented to person, place, and time. He appears  well-developed and well-nourished.  HENT:  Head: Normocephalic and atraumatic.  Neck: No JVD present.  Cardiovascular: Normal rate, regular rhythm and normal heart sounds.   No murmur heard. Pulmonary/Chest: Effort normal and breath sounds normal. He has no rales.  Abdominal: Soft. There is no tenderness.  Musculoskeletal: Normal range of motion. He exhibits no edema.  Neurological: He is alert and oriented to person, place, and time.  Skin: Skin is warm and dry.  Psychiatric: He  has a normal mood and affect.    Wt Readings from Last 3 Encounters:  03/08/16 185 lb (83.915 kg)  02/02/16 187 lb (84.823 kg)  11/23/15 186 lb 12.8 oz (84.732 kg)      Studies/Labs Reviewed:     EKG:  EKG is not ordered today.  The ekg ordered today demonstrates n/a  Recent Labs: 11/07/2015: ALT 23; TSH 3.75 11/11/2015: BUN 17; Creatinine, Ser 1.00; Hemoglobin 12.5*; Platelets 136*; Potassium 4.3; Sodium 137   Recent Lipid Panel    Component Value Date/Time   CHOL 241* 11/07/2015 0811   CHOL 230* 11/09/2014 1200   TRIG 261* 11/07/2015 0811   TRIG 152* 11/09/2014 1200   TRIG 228* 07/30/2006 0741   HDL 45 11/07/2015 0811   HDL 64 11/09/2014 1200   CHOLHDL 5.4* 11/07/2015 0811   CHOLHDL 5.5 CALC 07/30/2006 0741   VLDL 52* 11/07/2015 0811   LDLCALC 144* 11/07/2015 0811   LDLCALC 136* 11/09/2014 1200   LDLDIRECT 122.0 05/09/2015 1006   LDLDIRECT 120.3 07/30/2006 0741    Additional studies/ records that were reviewed today include:   LHC 11/10/15 LM ok LAD prox 50%, mid 95% LCx irregs RCA prox 30%, dist 25% EF 50-55% PCI 3 x 18 mm Resolute DES to mLAD 1. Severe single-vessel coronary artery disease with severe stenosis of the mid LAD, treated successfully with DES implantation 2. Mild nonobstructive disease of the right coronary artery, wide patency of the left circumflex, wide patency of the left main 3. Low normal LV systolic function Recommendations: The patient should be treated with  dual antiplatelet therapy for 12 months. Aggressive risk modification is indicated. The patient has been statin intolerant to various agents. He should be referred to the lipid clinic for consideration of a PCSK9 inhibitor. He has resting bradycardia and is not a candidate for a beta-blocker.        TEE 11/14 - Left ventricle: There was mild concentric hypertrophy. Systolic function was normal. The estimated ejection fraction was in the range of 60% to 65%. - Aortic valve: Structurally normal valve. - Mitral valve: Structurally normal valve. - Left atrium: The atrium was normal in size. No evidence of thrombus in the atrial cavity or appendage. - Right atrium: No evidence of thrombus in the atrial cavity or appendage. - Atrial septum: Small PFO with right to left shunting by color doppler. Saline microbubbles are seen shunting from right to left, especially with valsalva. - Tricuspid valve: Trivial regurgitation. - Pericardium, extracardiac: There was no pericardial effusion.  Event Monitor 10/14 Sinus brady  Carotid US 10/14 Findings suggest 1-39% internal carotid artery stenosis Bilaterally.  Echo 10/14 - Left ventricle: The cavity size was normal. Wall thickness was normal. Systolic function was normal. The estimated ejection fraction was in the range of 60% to 65%. Wall motion was normal; there were no regional wall motion abnormalities. Left ventricular diastolic function parameters were normal. - Atrial septum: No defect or patent foramen ovale was identified. Impressions:   Normal study.   ASSESSMENT:     1. Coronary artery disease involving native coronary artery of native heart without angina pectoris   2. Hyperlipidemia   3. Patent foramen ovale     PLAN:     In order of problems listed above:  1. CAD - He is s/p DES to LAD in 2/17.  He is stable without recurrent angina. He has atypical CP that is more positional and infrequent.   His BP and HR is too low to  start beta-blocker.  He is statin intol.  He is on Repatha and is followed by the lipid clinic.  Continue ASA, Plavix, PCSK-9 inhibitor.  FU with Dr. [name] in 6 mos.   2. HL - Continue Repatha.  FU labs due in July.  FU with Lipid Clinic as planned.   3. PFO - Small PFO on TEE in 2014 and prior cryptogenic CVA.  Continue ASA, Plavix.  FU with Dr. [name] in 6 mos.    Medication Adjustments/Labs and Tests Ordered: Current medicines are reviewed at length with the patient today.  Concerns regarding medicines are outlined above.  Medication changes, Labs and Tests ordered today are outlined in the Patient Instructions noted below. Patient Instructions  Medication Instructions:  No changes.  See your medication list.  Labwork: None today.  Keep your lab appointment in July 2017,  Testing/Procedures: None today.  Follow-Up: Dr. [name] in 6 months.   Any Other Special Instructions Will Be Listed Below (If Applicable).  If you need a refill on your cardiac medications before your next appointment, please call your pharmacy.    Signed, Richardson Dopp, PA-C  03/08/2016 11:23 AM    [facility] [address], [place], [place]  [address] Phone: [phone]; Fax: [phone]

## 2016-03-08 ENCOUNTER — Encounter: Payer: Self-pay | Admitting: Physician Assistant

## 2016-03-08 ENCOUNTER — Ambulatory Visit (INDEPENDENT_AMBULATORY_CARE_PROVIDER_SITE_OTHER): Payer: Federal, State, Local not specified - PPO | Admitting: Physician Assistant

## 2016-03-08 VITALS — BP 118/70 | HR 48 | Ht 73.5 in | Wt 185.0 lb

## 2016-03-08 DIAGNOSIS — I2510 Atherosclerotic heart disease of native coronary artery without angina pectoris: Secondary | ICD-10-CM | POA: Diagnosis not present

## 2016-03-08 DIAGNOSIS — Q211 Atrial septal defect: Secondary | ICD-10-CM

## 2016-03-08 DIAGNOSIS — Q2112 Patent foramen ovale: Secondary | ICD-10-CM

## 2016-03-08 DIAGNOSIS — E785 Hyperlipidemia, unspecified: Secondary | ICD-10-CM | POA: Diagnosis not present

## 2016-03-08 NOTE — Patient Instructions (Signed)
Medication Instructions:  No changes.  See your medication list.  Labwork: None today.  Keep your lab appointment in July 2017,  Testing/Procedures: None today.  Follow-Up: Dr. [name] in 6 months.   Any Other Special Instructions Will Be Listed Below (If Applicable).  If you need a refill on your cardiac medications before your next appointment, please call your pharmacy.

## 2016-03-27 ENCOUNTER — Other Ambulatory Visit (INDEPENDENT_AMBULATORY_CARE_PROVIDER_SITE_OTHER): Payer: Federal, State, Local not specified - PPO | Admitting: *Deleted

## 2016-03-27 DIAGNOSIS — Q211 Atrial septal defect: Secondary | ICD-10-CM | POA: Diagnosis not present

## 2016-03-27 DIAGNOSIS — Q2112 Patent foramen ovale: Secondary | ICD-10-CM

## 2016-03-27 DIAGNOSIS — E785 Hyperlipidemia, unspecified: Secondary | ICD-10-CM

## 2016-03-27 LAB — LIPID PANEL
Cholesterol: 189 mg/dL (ref 125–200)
HDL: 55 mg/dL (ref >=40)
LDL Cholesterol: 103 mg/dL (ref <130)
Total CHOL/HDL Ratio: 3.4 Ratio (ref <=5.0)
Triglycerides: 156 mg/dL — ABNORMAL HIGH (ref <150)
VLDL: 31 mg/dL — ABNORMAL HIGH (ref <30)

## 2016-04-06 ENCOUNTER — Other Ambulatory Visit: Payer: Self-pay | Admitting: Pharmacist

## 2016-04-06 ENCOUNTER — Encounter: Payer: Self-pay | Admitting: Pharmacist

## 2016-04-06 DIAGNOSIS — E785 Hyperlipidemia, unspecified: Secondary | ICD-10-CM

## 2016-04-18 ENCOUNTER — Other Ambulatory Visit (INDEPENDENT_AMBULATORY_CARE_PROVIDER_SITE_OTHER): Payer: Federal, State, Local not specified - PPO

## 2016-04-18 DIAGNOSIS — E785 Hyperlipidemia, unspecified: Secondary | ICD-10-CM

## 2016-04-18 LAB — LIPID PANEL
Cholesterol: 122 mg/dL — ABNORMAL LOW (ref 125–200)
HDL: 52 mg/dL (ref >=40)
LDL Cholesterol: 47 mg/dL (ref <130)
Total CHOL/HDL Ratio: 2.3 Ratio (ref <=5.0)
Triglycerides: 115 mg/dL (ref <150)
VLDL: 23 mg/dL (ref <30)

## 2016-04-19 ENCOUNTER — Encounter: Payer: Self-pay | Admitting: Internal Medicine

## 2016-04-19 ENCOUNTER — Ambulatory Visit (INDEPENDENT_AMBULATORY_CARE_PROVIDER_SITE_OTHER): Payer: Federal, State, Local not specified - PPO | Admitting: Internal Medicine

## 2016-04-19 ENCOUNTER — Other Ambulatory Visit (INDEPENDENT_AMBULATORY_CARE_PROVIDER_SITE_OTHER): Payer: Federal, State, Local not specified - PPO

## 2016-04-19 VITALS — BP 110/68 | HR 47 | Ht 74.0 in | Wt 181.0 lb

## 2016-04-19 DIAGNOSIS — Z8673 Personal history of transient ischemic attack (TIA), and cerebral infarction without residual deficits: Secondary | ICD-10-CM

## 2016-04-19 DIAGNOSIS — I2510 Atherosclerotic heart disease of native coronary artery without angina pectoris: Secondary | ICD-10-CM

## 2016-04-19 DIAGNOSIS — Z0000 Encounter for general adult medical examination without abnormal findings: Secondary | ICD-10-CM

## 2016-04-19 DIAGNOSIS — Z23 Encounter for immunization: Secondary | ICD-10-CM | POA: Diagnosis not present

## 2016-04-19 DIAGNOSIS — I2583 Coronary atherosclerosis due to lipid rich plaque: Secondary | ICD-10-CM

## 2016-04-19 DIAGNOSIS — E785 Hyperlipidemia, unspecified: Secondary | ICD-10-CM | POA: Diagnosis not present

## 2016-04-19 LAB — URINALYSIS
Bilirubin Urine: NEGATIVE
Hgb urine dipstick: NEGATIVE
Ketones, ur: NEGATIVE
Leukocytes, UA: NEGATIVE
Nitrite: NEGATIVE
Specific Gravity, Urine: 1.02 (ref 1.000–1.030)
Total Protein, Urine: NEGATIVE
Urine Glucose: NEGATIVE
Urobilinogen, UA: 0.2 (ref 0.0–1.0)
pH: 5.5 (ref 5.0–8.0)

## 2016-04-19 LAB — CBC WITH DIFFERENTIAL/PLATELET
Basophils Absolute: 0 10*3/uL (ref 0.0–0.1)
Basophils Relative: 0.5 % (ref 0.0–3.0)
Eosinophils Absolute: 0.4 10*3/uL (ref 0.0–0.7)
Eosinophils Relative: 9.6 % — ABNORMAL HIGH (ref 0.0–5.0)
HCT: 37.4 % — ABNORMAL LOW (ref 39.0–52.0)
Hemoglobin: 12.9 g/dL — ABNORMAL LOW (ref 13.0–17.0)
Lymphocytes Relative: 26.7 % (ref 12.0–46.0)
Lymphs Abs: 1.1 10*3/uL (ref 0.7–4.0)
MCHC: 34.5 g/dL (ref 30.0–36.0)
MCV: 92.6 fl (ref 78.0–100.0)
Monocytes Absolute: 0.5 10*3/uL (ref 0.1–1.0)
Monocytes Relative: 11.4 % (ref 3.0–12.0)
Neutro Abs: 2.1 10*3/uL (ref 1.4–7.7)
Neutrophils Relative %: 51.8 % (ref 43.0–77.0)
Platelets: 178 10*3/uL (ref 150.0–400.0)
RBC: 4.04 Mil/uL — ABNORMAL LOW (ref 4.22–5.81)
RDW: 13.2 % (ref 11.5–15.5)
WBC: 4 10*3/uL (ref 4.0–10.5)

## 2016-04-19 LAB — BASIC METABOLIC PANEL
BUN: 22 mg/dL (ref 6–23)
CO2: 31 mEq/L (ref 19–32)
Calcium: 9.6 mg/dL (ref 8.4–10.5)
Chloride: 104 mEq/L (ref 96–112)
Creatinine, Ser: 1.05 mg/dL (ref 0.40–1.50)
GFR: 74.21 mL/min (ref >60.00)
Glucose, Bld: 89 mg/dL (ref 70–99)
Potassium: 4.6 mEq/L (ref 3.5–5.1)
Sodium: 139 mEq/L (ref 135–145)

## 2016-04-19 LAB — PSA: PSA: 4.83 ng/mL — ABNORMAL HIGH (ref 0.10–4.00)

## 2016-04-19 LAB — TSH: TSH: 1.88 u[IU]/mL (ref 0.35–4.50)

## 2016-04-19 MED ORDER — LEVOTHYROXINE SODIUM 25 MCG PO TABS: 25.0000 ug | ORAL_TABLET | Freq: Every day | ORAL | 3 refills | Status: DC

## 2016-04-19 NOTE — Assessment & Plan Note (Signed)
In 2014 he had a cryptogenic stroke and was found to have a PFO with right-to-left shunt. MRI at that time showed a small right occipital lobe cortical infarct. He's been followed in the lipid clinic for [place].  He is using Repatha, Plavix, ASA.

## 2016-04-19 NOTE — Assessment & Plan Note (Signed)
Dr Burt Knack STENT 2017 In 2014 he had a cryptogenic stroke and was found to have a PFO with right-to-left shunt. MRI at that time showed a small right occipital lobe cortical infarct. He's been followed in the lipid clinic for [place].  He is using Repatha, Plavix, ASA.

## 2016-04-19 NOTE — Assessment & Plan Note (Signed)
We discussed age appropriate health related issues, including available/recomended screening tests and vaccinations. We discussed a need for adhering to healthy diet and exercise. Labs/EKG were reviewed/ordered. All questions were answered.   He thinks he had Zostavax Prevnar

## 2016-04-19 NOTE — Progress Notes (Signed)
Subjective:  Patient ID: Richard Ellis, male    DOB: 01-Aug-1946  Age: 70 y.o. MRN: [id number]  CC: Annual Exam   HPI Richard Ellis presents for a well exam  In 2014 he had a cryptogenic stroke and was found to have a PFO with right-to-left shunt. MRI at that time showed a small right occipital lobe cortical infarct. He's been followed in the lipid clinic and he has been treated with Zetia, fish oil, and Krill oil. He is using Repatha, Plavix, ASA.   He continues to maintain an active lifestyle. He runs for exercise, but recently has been limited by back problems. He has been a competitive runner for many years. His main complaint is easy bruising. This is been a problem ever since he has taken Plavix. He denies chest pain, shortness of breath, or any focal neurologic complaints.   Outpatient Medications Prior to Visit  Medication Sig Dispense Refill  . aspirin EC 81 MG tablet Take 1 tablet (81 mg total) by mouth daily.    . Cholecalciferol 2000 UNITS CAPS Take 1 capsule by mouth daily.      . clopidogrel (PLAVIX) 75 MG tablet Take 1 tablet (75 mg total) by mouth daily with breakfast. 30 tablet 11  . Evolocumab (REPATHA SURECLICK) XX123456 MG/ML SOAJ Inject 140 mg into the skin every 14 (fourteen) days. 2 pen 6  . gabapentin (NEURONTIN) 100 MG capsule Take 100 mg by mouth daily.    [name] levothyroxine (SYNTHROID, LEVOTHROID) 25 MCG tablet Take 1 tablet (25 mcg total) by mouth daily before breakfast. 30 tablet 0  . Omega-3 Fatty Acids (FISH OIL) 1000 MG CAPS Take 2,000 mg by mouth 2 (two) times daily.    [name] PROAIR RESPICLICK 123XX123 (90 Base) MCG/ACT AEPB Inhale 2 puffs into the lungs every 4 (four) hours as needed. Wheezing or sob  0  . umeclidinium-vilanterol (ANORO ELLIPTA) 62.5-25 MCG/INH AEPB Inhale 1 puff into the lungs daily as needed (FOR WHEEZING).     No facility-administered medications prior to visit.     ROS Review of Systems  Constitutional: Negative for appetite change, fatigue and  unexpected weight change.  HENT: Negative for congestion, nosebleeds, sneezing, sore throat and trouble swallowing.   Eyes: Negative for itching and visual disturbance.  Respiratory: Negative for cough.   Cardiovascular: Negative for chest pain, palpitations and leg swelling.  Gastrointestinal: Negative for abdominal distention, blood in stool, diarrhea and nausea.  Genitourinary: Negative for frequency and hematuria.  Musculoskeletal: Positive for back pain. Negative for gait problem, joint swelling and neck pain.  Skin: Negative for rash.  Neurological: Negative for dizziness, tremors, speech difficulty and weakness.  Hematological: Bruises/bleeds easily.  Psychiatric/Behavioral: Negative for agitation, dysphoric mood and sleep disturbance. The patient is not nervous/anxious.     Objective:  BP 110/68   Pulse (!) 47   Ht 6\' 2"  (1.88 m)   Wt 181 lb (82.1 kg)   SpO2 98%   BMI 23.24 kg/m   BP Readings from Last 3 Encounters:  04/19/16 110/68  03/08/16 118/70  02/02/16 120/68    Wt Readings from Last 3 Encounters:  04/19/16 181 lb (82.1 kg)  03/08/16 185 lb (83.9 kg)  02/02/16 187 lb (84.8 kg)    Physical Exam  Constitutional: He is oriented to person, place, and time. He appears well-developed and well-nourished. No distress.  HENT:  Head: Normocephalic and atraumatic.  Right Ear: External ear normal.  Left Ear: External ear normal.  Nose: Nose normal.  Mouth/Throat: Oropharynx is clear and moist. No oropharyngeal exudate.  Eyes: Conjunctivae and EOM are normal. Pupils are equal, round, and reactive to light. Right eye exhibits no discharge. Left eye exhibits no discharge. No scleral icterus.  Neck: Normal range of motion. Neck supple. No JVD present. No tracheal deviation present. No thyromegaly present.  Cardiovascular: Normal rate, regular rhythm, normal heart sounds and intact distal pulses.  Exam reveals no gallop and no friction rub.   No murmur  heard. Pulmonary/Chest: Effort normal and breath sounds normal. No stridor. No respiratory distress. He has no wheezes. He has no rales. He exhibits no tenderness.  Abdominal: Soft. Bowel sounds are normal. He exhibits no distension and no mass. There is no tenderness. There is no rebound and no guarding.  Genitourinary: Rectum normal and penis normal. Rectal exam shows guaiac negative stool. No penile tenderness.  Musculoskeletal: Normal range of motion. He exhibits no edema or tenderness.  Lymphadenopathy:    He has no cervical adenopathy.  Neurological: He is alert and oriented to person, place, and time. He has normal reflexes. No cranial nerve deficit. He exhibits normal muscle tone. Coordination normal.  Skin: Skin is warm and dry. No rash noted. He is not diaphoretic. No erythema. No pallor.  Psychiatric: He has a normal mood and affect. His behavior is normal. Judgment and thought content normal.  bruises Prostate 2+  Lab Results  Component Value Date   WBC 5.1 11/11/2015   HGB 12.5 (L) 11/11/2015   HCT 36.0 (L) 11/11/2015   PLT 136 (L) 11/11/2015   GLUCOSE 101 (H) 11/11/2015   CHOL 122 (L) 04/18/2016   TRIG 115 04/18/2016   HDL 52 04/18/2016   LDLDIRECT 122.0 05/09/2015   LDLCALC 47 04/18/2016   ALT 23 11/07/2015   AST 27 11/07/2015   NA 137 11/11/2015   K 4.3 11/11/2015   CL 106 11/11/2015   CREATININE 1.00 11/11/2015   BUN 17 11/11/2015   CO2 23 11/11/2015   TSH 3.75 11/07/2015   PSA 2.36 11/07/2015   INR 0.95 11/07/2015   HGBA1C 5.8 02/02/2014    No results found.  Assessment & Plan:   There are no diagnoses linked to this encounter. I am having Richard Ellis maintain his Cholecalciferol, Fish Oil, PROAIR RESPICLICK, aspirin EC, clopidogrel, Evolocumab, levothyroxine, gabapentin, and umeclidinium-vilanterol.  No orders of the defined types were placed in this encounter.    Follow-up: No Follow-up on file.  [name], MD

## 2016-04-19 NOTE — Progress Notes (Signed)
Pre visit review using our clinic review tool, if applicable. No additional management support is needed unless otherwise documented below in the visit note. 

## 2016-04-19 NOTE — Assessment & Plan Note (Signed)
Statin intolerant Dr Burt Knack  He's been followed in the lipid clinic for [place].

## 2016-04-23 ENCOUNTER — Other Ambulatory Visit: Payer: Federal, State, Local not specified - PPO

## 2016-04-23 ENCOUNTER — Ambulatory Visit
Admission: RE | Admit: 2016-04-23 | Discharge: 2016-04-23 | Disposition: A | Discharge status: Home / Self Care | Payer: Federal, State, Local not specified - PPO | Source: Ambulatory Visit | Attending: Physician Assistant | Admitting: Physician Assistant

## 2016-04-23 ENCOUNTER — Encounter: Payer: Self-pay | Admitting: Pharmacist

## 2016-04-23 DIAGNOSIS — I208 Other forms of angina pectoris: Secondary | ICD-10-CM

## 2016-04-23 NOTE — Progress Notes (Signed)
Dear Mr Vinas, Your labs/tests are good, except for a little elevated PSA and a borderline anemia (not new). I'll send your PSA report to Dr Risa Grill. Sincerely, [name], MD

## 2016-05-02 ENCOUNTER — Encounter: Payer: Self-pay | Admitting: Pharmacist

## 2016-05-10 ENCOUNTER — Telehealth: Payer: Self-pay | Admitting: Cardiovascular Disease

## 2016-05-10 MED ORDER — EVOLOCUMAB 140 MG/ML ~~LOC~~ SOAJ: 140.0000 mg | SUBCUTANEOUS | 11 refills | Status: DC

## 2016-05-10 NOTE — Telephone Encounter (Signed)
New message   Pam Is calling for a new prescription the medication Repatha 140mg  was defective   Please list for a quantity of one  Fax number [phone]

## 2016-05-10 NOTE — Telephone Encounter (Signed)
Original rx was escribed correctly, the quantity is supposed to be 2 per month, not 1. Resent in rx to [facility] for their records.

## 2016-05-14 ENCOUNTER — Telehealth: Payer: Self-pay | Admitting: Cardiovascular Disease

## 2016-05-14 NOTE — Telephone Encounter (Signed)
New message      Please call her about a replacement prescription for repatha.

## 2016-05-14 NOTE — Telephone Encounter (Signed)
Spoke with [facility].  They would like a prescription for 1 replacement Repatha syringe.  Spoke with Ky Barban, pharmacist.  Gave verbal okay to fill 1 Repatha syringe for patient.

## 2016-05-22 ENCOUNTER — Telehealth: Payer: Self-pay | Admitting: Cardiovascular Disease

## 2016-05-22 ENCOUNTER — Encounter: Payer: Self-pay | Admitting: Cardiology

## 2016-05-22 NOTE — Telephone Encounter (Signed)
New Message  Pt c/o medication issue:  1. Name of Medication: Repatha Sureclick  2. How are you currently taking this medication (dosage and times per day)?  140 mg/ml soaj injection into skin every 14 days   3. Are you having a reaction (difficulty breathing--STAT)? Tired, weakness, & hips hurt.  4. What is your medication issue? Pt voiced he's having a side effect of this medication. Pt voiced he's tired, weakness, and hips hurt.

## 2016-05-24 ENCOUNTER — Encounter: Payer: Self-pay | Admitting: Pharmacist

## 2016-05-24 NOTE — Telephone Encounter (Signed)
Please see MyChart message from 9/6 and response to patient.

## 2016-05-24 NOTE — Telephone Encounter (Signed)
Pt also sent message through [place].  Responded to his question through [place].  Will hold Repatha injections to see if symptoms resolve.

## 2016-07-18 ENCOUNTER — Other Ambulatory Visit: Payer: Self-pay | Admitting: Family Medicine

## 2016-07-19 NOTE — Telephone Encounter (Signed)
Refill done.  

## 2016-10-08 ENCOUNTER — Encounter: Payer: Self-pay | Admitting: Cardiovascular Disease

## 2016-10-08 ENCOUNTER — Other Ambulatory Visit: Payer: Self-pay | Admitting: Cardiovascular Disease

## 2016-10-08 ENCOUNTER — Ambulatory Visit (INDEPENDENT_AMBULATORY_CARE_PROVIDER_SITE_OTHER): Payer: Federal, State, Local not specified - PPO | Admitting: Cardiovascular Disease

## 2016-10-08 VITALS — BP 118/64 | HR 56 | Ht 73.5 in | Wt 183.8 lb

## 2016-10-08 DIAGNOSIS — R9431 Abnormal electrocardiogram [ECG] [EKG]: Secondary | ICD-10-CM | POA: Diagnosis not present

## 2016-10-08 DIAGNOSIS — I25119 Atherosclerotic heart disease of native coronary artery with unspecified angina pectoris: Secondary | ICD-10-CM

## 2016-10-08 DIAGNOSIS — I25118 Atherosclerotic heart disease of native coronary artery with other forms of angina pectoris: Secondary | ICD-10-CM

## 2016-10-08 MED ORDER — NITROGLYCERIN 0.4 MG SL SUBL: 0.4000 mg | SUBLINGUAL_TABLET | SUBLINGUAL | 3 refills | Status: DC | PRN

## 2016-10-08 NOTE — Progress Notes (Signed)
Cardiology Office Note Date:  10/08/2016   ID:  Daine, Stoia [birth year], MRN [id number]  PCP:  [name], MD  Cardiologist:  [name], MD    Chief Complaint  Patient presents with  . Coronary Artery Disease    1 year follow up     History of Present Illness: Richard Ellis is a 71 y.o. male who presents for follow-up of CAD.The patient was found to have obstructive LAD stenosis proximally one year ago he underwent PCI mid LAD with a drug-eluting stent. He also has a history of PFO and cryptogenic stroke and has been managed with antiplatelet therapy.  The patient reports exertional chest tightness now for several months. States that this is occurred ever since he went back to exercising following his PCI procedure. He's been an avid runner. He ran a 5K in December and had fairly severe chest tightness during the race. This lasted 15-20 minutes afterwards. He has not had any prolonged episodes of chest pain lasting for hours. He does have associated nausea. He denies shortness of breath, heart palpitations, lightheadedness, syncope, orthopnea, or PND. He's had no resting chest pain. He's had no chest pain or pressure with normal activities. He has been able to swim for exercise without chest discomfort.   Past Medical History:  Diagnosis Date  . Allergy    rhinitis  . Anemia    "Hgb always on the low side" (07/02/2013)  . Bronchitis   . CAD (coronary artery disease)    a. LHC 2/17: pLAD 50, mLAD 95, pRCA 30, dRCA 25, EF 50-55% >> PCI:  3 x 18 mm Resolute DES to mLAD   . Chronic bronchitis ([place])    "q year" (07/02/2013)  . Cryptogenic stroke ([place]) 07/02/2013   a. "small ischemic occipital right sided" (07/02/2013)  //  b. Event monitor 10/14: sinus brady  //  c. Carotid US 10/14: bilat ICA 1-39%  . Esophageal stricture   . Heart murmur    "as a child" (07/02/2013)  . History of echocardiogram    a. Echo 10/14: EF 60-65%, no RWMA, normal diastolic function  .  Hyperlipidemia   . Hypothyroidism   . Osteoarthritis    "maybe a little bit in my spine" (07/02/2013)  . PFO (patent foramen ovale)    a. TEE 11/14: mild LVH, EF 60-65%, small PFO, R-L shunt    Past Surgical History:  Procedure Laterality Date  . CARDIAC CATHETERIZATION N/A 11/10/2015   Procedure: Left Heart Cath and Coronary Angiography;  Surgeon: [name], MD;  Location: [place];  Service: Cardiovascular;  Laterality: N/A;  . CARDIAC CATHETERIZATION N/A 11/10/2015   Procedure: Coronary Stent Intervention;  Surgeon: [name], MD;  Location: [place] [facility];  Service: Cardiovascular;  Laterality: N/A;  mid lad  3.0x18 resolute  . ESOPHAGOGASTRODUODENOSCOPY N/A 10/26/2012   Procedure: ESOPHAGOGASTRODUODENOSCOPY (EGD);  Surgeon: Winfield Cunas., MD;  Location: [facility]OSCOPY;  Service: Endoscopy;  Laterality: N/A;  . ESOPHAGOGASTRODUODENOSCOPY N/A 07/08/2013   Procedure: ESOPHAGOGASTRODUODENOSCOPY (EGD);  Surgeon: Winfield Cunas., MD;  Location: [facility];  Service: Endoscopy;  Laterality: N/A;  . SAVORY DILATION N/A 07/08/2013   Procedure: SAVORY DILATION;  Surgeon: Winfield Cunas., MD;  Location: [facility] ENDOSCOPY;  Service: Endoscopy;  Laterality: N/A;  . TEE WITHOUT CARDIOVERSION N/A 07/28/2013   Procedure: TRANSESOPHAGEAL ECHOCARDIOGRAM (TEE);  Surgeon: Pixie Casino, MD;  Location: [facility] ENDOSCOPY;  Service: Cardiovascular;  Laterality: N/A;  . TONSILLECTOMY  1950's    Current Outpatient Prescriptions  Medication Sig Dispense Refill  . aspirin EC 81 MG tablet Take 1 tablet (81 mg total) by mouth daily. (Patient taking differently: Take 81 mg by mouth every evening. )    . Cholecalciferol 2000 UNITS CAPS Take 2,000 Units by mouth every evening.     . clopidogrel (PLAVIX) 75 MG tablet Take 1 tablet (75 mg total) by mouth daily with breakfast. 30 tablet 11  . Evolocumab (REPATHA SURECLICK) XX123456 MG/ML SOAJ Inject 140 mg into the skin every 14 (fourteen)  days. (Patient not taking: Reported on 10/08/2016) 2 pen 11  . gabapentin (NEURONTIN) 100 MG capsule Take 100 mg by mouth daily.    [name] gabapentin (NEURONTIN) 100 MG capsule TAKE ONE CAPSULE BY MOUTH TWICE A DAY (Patient taking differently: take 100mg s every eveining) 60 capsule 3  . levothyroxine (SYNTHROID, LEVOTHROID) 25 MCG tablet Take 1 tablet (25 mcg total) by mouth daily before breakfast. 90 tablet 3  . acetaminophen (TYLENOL) 500 MG tablet Take 1,000 mg by mouth 2 (two) times daily.    . nitroGLYCERIN (NITROSTAT) 0.4 MG SL tablet Place 1 tablet (0.4 mg total) under the tongue every 5 (five) minutes as needed for chest pain. 25 tablet 3  . Omega-3 Fatty Acids (FISH OIL ULTRA) 1400 MG CAPS Take 1,400 mg by mouth 2 (two) times daily.    . Turmeric 500 MG TABS Take 500 mg by mouth every evening.    . vitamin C (ASCORBIC ACID) 500 MG tablet Take 500 mg by mouth daily.     No current facility-administered medications for this visit.     Allergies:   Atorvastatin; Crestor [rosuvastatin]; Demerol [meperidine]; Repatha [evolocumab]; and Niacin and related   Social History:  The patient  reports that he has never smoked. He has never used smokeless tobacco. He reports that he drinks about 4.8 oz of alcohol per week . He reports that he does not use drugs.   Family History:  The patient's  family history includes Coronary artery disease in his other; Heart disease in his father.    ROS:  Please see the history of present illness.  Otherwise, review of systems is positive for back pain.  All other systems are reviewed and negative.    PHYSICAL EXAM: VS:  BP 118/64   Pulse (!) 56   Ht 6' 1.5" (1.867 m)   Wt 183 lb 12.8 oz (83.4 kg)   BMI 23.92 kg/m  , BMI Body mass index is 23.92 kg/m. GEN: Well nourished, well developed, in no acute distress  HEENT: normal  Neck: no JVD, no masses. No carotid bruits Cardiac: RRR without murmur or gallop                Respiratory:  clear to auscultation  bilaterally, normal work of breathing GI: soft, nontender, nondistended, + BS MS: no deformity or atrophy  Ext: no pretibial edema, pedal pulses 2+= bilaterally Skin: warm and dry, no rash Neuro:  Strength and sensation are intact Psych: euthymic mood, full affect  EKG:  EKG is ordered today. The ekg ordered today shows sinus bradycardia, age-indeterminate anteroseptal MI from previous tracing, inferior ST elevation without significant change from previous tracing.  Recent Labs: 11/07/2015: ALT 23 04/19/2016: BUN 22; Creatinine, Ser 1.05; Hemoglobin 12.9; Platelets 178.0; Potassium 4.6; Sodium 139; TSH 1.88   Lipid Panel     Component Value Date/Time   CHOL 122 (L) 04/18/2016 0816   CHOL 230 (H) 11/09/2014 1200  TRIG 115 04/18/2016 0816   TRIG 152 (H) 11/09/2014 1200   TRIG 228 (HH) 07/30/2006 0741   HDL 52 04/18/2016 0816   HDL 64 11/09/2014 1200   CHOLHDL 2.3 04/18/2016 0816   VLDL 23 04/18/2016 0816   LDLCALC 47 04/18/2016 0816   LDLCALC 136 (H) 11/09/2014 1200   LDLDIRECT 122.0 05/09/2015 1006      Wt Readings from Last 3 Encounters:  10/08/16 183 lb 12.8 oz (83.4 kg)  04/19/16 181 lb (82.1 kg)  03/08/16 185 lb (83.9 kg)     Cardiac Studies Reviewed: Cardiac Cath 11-10-2015: Conclusion    Prox RCA lesion, 30% stenosed.  Dist RCA lesion, 25% stenosed.  Prox LAD lesion, 50% stenosed.  The left ventricular systolic function is normal.  Mid LAD lesion, 95% stenosed. Post intervention, there is a 0% residual stenosis.   1. Severe single-vessel coronary artery disease with severe stenosis of the mid LAD, treated successfully with DES implantation  2. Mild nonobstructive disease of the right coronary artery, wide patency of the left circumflex, wide patency of the left main  3. Low normal LV systolic function  Recommendations: The patient should be treated with dual antiplatelet therapy for 12 months. Aggressive risk modification is indicated. The patient has  been statin intolerant to various agents. He should be referred to the lipid clinic for consideration of a PCSK9 inhibitor. He has resting bradycardia and is not a candidate for a beta-blocker.     ASSESSMENT AND PLAN: 1.  CAD, native vessel, with CCS class III angina: The patient has anginal symptoms early on with his jogging. He's had a marked change in his EKG compared to previous. The patient has had an anteroseptal infarct and has marked T-wave abnormality which was not previously present. Considering his classic symptoms with exertional angina, resolving with rest, and known stenting of the LAD in the past, I have recommended repeat cardiac catheterization and possible PCI. I have advised the patient to avoid any exercise or strenuous activity until his heart catheterization is done. He should continue on dual antiplatelet therapy with aspirin and Plavix. The patient has been adherent to his medications.  I have reviewed the risks, indications, and alternatives to cardiac catheterization, possible angioplasty, and stenting with the patient. Risks include but are not limited to bleeding, infection, vascular injury, stroke, myocardial infection, arrhythmia, kidney injury, radiation-related injury in the case of prolonged fluoroscopy use, emergency cardiac surgery, and death. The patient understands the risks of serious complication is 1-2 in 123XX123 with diagnostic cardiac cath and 1-2% or less with angioplasty/stenting.   2. Hyperlipidemia: The patient is statin intolerant. He was intolerant to PCSK9 inhibitors. Will review options with him at the time of his cath procedure tomorrow after review of results.   Current medicines are reviewed with the patient today.  The patient does not have concerns regarding medicines.  Labs/ tests ordered today include:   Orders Placed This Encounter  Procedures  . Basic Metabolic Panel (BMET)  . CBC  . INR/PT  . EKG 12-Lead    Disposition:   FU pending  cath results  Signed, [name], MD  10/08/2016 2:02 PM    [facility] [address], [place], [place]  [address] Phone: [phone]; Fax: [phone]

## 2016-10-08 NOTE — Patient Instructions (Signed)
Medication Instructions:  Your physician recommends that you continue on your current medications as directed. Please refer to the Current Medication list given to you today.  Prescription given for Nitroglycerin to be used as needed for Chest Pain  Labwork: Your physician recommends that you have lab work today: BMP, CBC and PT/INR  Testing/Procedures: Your physician has requested that you have a cardiac catheterization. Cardiac catheterization is used to diagnose and/or treat various heart conditions. Doctors may recommend this procedure for a number of different reasons. The most common reason is to evaluate chest pain. Chest pain can be a symptom of coronary artery disease (CAD), and cardiac catheterization can show whether plaque is narrowing or blocking your heart's arteries. This procedure is also used to evaluate the valves, as well as measure the blood flow and oxygen levels in different parts of your heart. For further information please visit [email or web address]. Please follow instruction sheet, as given.  Follow-Up: We will arrange further follow-up after cardiac catheterization.   Any Other Special Instructions Will Be Listed Below (If Applicable).     If you need a refill on your cardiac medications before your next appointment, please call your pharmacy.

## 2016-10-09 ENCOUNTER — Encounter (HOSPITAL_COMMUNITY)
Admission: RE | Disposition: A | Discharge status: Home / Self Care | Payer: Self-pay | Source: Ambulatory Visit | Attending: Cardiovascular Disease

## 2016-10-09 ENCOUNTER — Ambulatory Visit (HOSPITAL_COMMUNITY)
Admission: RE | Admit: 2016-10-09 | Discharge: 2016-10-10 | Disposition: A | Discharge status: Home / Self Care | Payer: Federal, State, Local not specified - PPO | Source: Ambulatory Visit | Attending: Cardiovascular Disease | Admitting: Cardiovascular Disease

## 2016-10-09 ENCOUNTER — Encounter (HOSPITAL_COMMUNITY): Payer: Self-pay | Admitting: General Practice

## 2016-10-09 DIAGNOSIS — I2510 Atherosclerotic heart disease of native coronary artery without angina pectoris: Secondary | ICD-10-CM

## 2016-10-09 DIAGNOSIS — I2584 Coronary atherosclerosis due to calcified coronary lesion: Secondary | ICD-10-CM | POA: Insufficient documentation

## 2016-10-09 DIAGNOSIS — Z8249 Family history of ischemic heart disease and other diseases of the circulatory system: Secondary | ICD-10-CM | POA: Insufficient documentation

## 2016-10-09 DIAGNOSIS — Z7982 Long term (current) use of aspirin: Secondary | ICD-10-CM | POA: Insufficient documentation

## 2016-10-09 DIAGNOSIS — Z8673 Personal history of transient ischemic attack (TIA), and cerebral infarction without residual deficits: Secondary | ICD-10-CM | POA: Insufficient documentation

## 2016-10-09 DIAGNOSIS — I208 Other forms of angina pectoris: Secondary | ICD-10-CM | POA: Diagnosis present

## 2016-10-09 DIAGNOSIS — D649 Anemia, unspecified: Secondary | ICD-10-CM | POA: Insufficient documentation

## 2016-10-09 DIAGNOSIS — I25118 Atherosclerotic heart disease of native coronary artery with other forms of angina pectoris: Secondary | ICD-10-CM

## 2016-10-09 DIAGNOSIS — I2089 Other forms of angina pectoris: Secondary | ICD-10-CM | POA: Diagnosis present

## 2016-10-09 DIAGNOSIS — Z7902 Long term (current) use of antithrombotics/antiplatelets: Secondary | ICD-10-CM | POA: Insufficient documentation

## 2016-10-09 DIAGNOSIS — E785 Hyperlipidemia, unspecified: Secondary | ICD-10-CM | POA: Diagnosis not present

## 2016-10-09 DIAGNOSIS — M1990 Unspecified osteoarthritis, unspecified site: Secondary | ICD-10-CM | POA: Diagnosis not present

## 2016-10-09 DIAGNOSIS — Q211 Atrial septal defect: Secondary | ICD-10-CM | POA: Insufficient documentation

## 2016-10-09 DIAGNOSIS — E039 Hypothyroidism, unspecified: Secondary | ICD-10-CM | POA: Insufficient documentation

## 2016-10-09 DIAGNOSIS — J42 Unspecified chronic bronchitis: Secondary | ICD-10-CM | POA: Insufficient documentation

## 2016-10-09 DIAGNOSIS — I25810 Atherosclerosis of coronary artery bypass graft(s) without angina pectoris: Secondary | ICD-10-CM | POA: Diagnosis present

## 2016-10-09 HISTORY — DX: Other forms of angina pectoris: I20.89

## 2016-10-09 HISTORY — DX: Low back pain, unspecified: M54.50

## 2016-10-09 HISTORY — DX: Other forms of angina pectoris: I20.8

## 2016-10-09 HISTORY — PX: CARDIAC CATHETERIZATION: SHX172

## 2016-10-09 HISTORY — DX: Low back pain: M54.5

## 2016-10-09 HISTORY — DX: Other chronic pain: G89.29

## 2016-10-09 LAB — POCT ACTIVATED CLOTTING TIME
Activated Clotting Time: 219 seconds
Activated Clotting Time: 235 seconds
Activated Clotting Time: 257 seconds
Activated Clotting Time: 268 seconds
Activated Clotting Time: 263 seconds

## 2016-10-09 LAB — SPECIMEN STATUS REPORT

## 2016-10-09 LAB — BASIC METABOLIC PANEL
BUN/Creatinine Ratio: 18 (ref 10–24)
BUN: 18 mg/dL (ref 8–27)
CO2: 26 mmol/L (ref 18–29)
Calcium: 9.3 mg/dL (ref 8.6–10.2)
Chloride: 101 mmol/L (ref 96–106)
Creatinine, Ser: 0.99 mg/dL (ref 0.76–1.27)
GFR calc Af Amer: 89 mL/min/{1.73_m2} (ref >59)
GFR calc non Af Amer: 77 mL/min/{1.73_m2} (ref >59)
Glucose: 96 mg/dL (ref 65–99)
Potassium: 4.4 mmol/L (ref 3.5–5.2)
Sodium: 139 mmol/L (ref 134–144)

## 2016-10-09 LAB — CBC
Hematocrit: 39.2 % (ref 37.5–51.0)
Hemoglobin: 12.9 g/dL — ABNORMAL LOW (ref 13.0–17.7)
MCH: 31.5 pg (ref 26.6–33.0)
MCHC: 32.9 g/dL (ref 31.5–35.7)
MCV: 96 fL (ref 79–97)
Platelets: 202 10*3/uL (ref 150–379)
RBC: 4.1 x10E6/uL — ABNORMAL LOW (ref 4.14–5.80)
RDW: 13.7 % (ref 12.3–15.4)
WBC: 5 10*3/uL (ref 3.4–10.8)

## 2016-10-09 LAB — PROTIME-INR
INR: 1 (ref 0.8–1.2)
Prothrombin Time: 10.4 s (ref 9.1–12.0)

## 2016-10-09 SURGERY — LEFT HEART CATH AND CORONARY ANGIOGRAPHY

## 2016-10-09 MED ORDER — ONDANSETRON HCL 4 MG/2ML IJ SOLN: 4.0000 mg | Freq: Four times a day (QID) | INTRAMUSCULAR | Status: DC | PRN

## 2016-10-09 MED ORDER — MIDAZOLAM HCL 2 MG/2ML IJ SOLN
INTRAMUSCULAR | Status: AC
  Filled 2016-10-09: qty 2

## 2016-10-09 MED ORDER — MIDAZOLAM HCL 2 MG/2ML IJ SOLN
INTRAMUSCULAR | Status: AC
Start: 2016-10-09 — End: 2016-10-09
  Filled 2016-10-09: qty 2

## 2016-10-09 MED ORDER — HEPARIN SODIUM (PORCINE) 1000 UNIT/ML IJ SOLN
INTRAMUSCULAR | Status: AC
Start: 2016-10-09 — End: 2016-10-09
  Filled 2016-10-09: qty 1

## 2016-10-09 MED ORDER — VERAPAMIL HCL 2.5 MG/ML IV SOLN
INTRAVENOUS | Status: AC
  Filled 2016-10-09: qty 2

## 2016-10-09 MED ORDER — ACETAMINOPHEN 325 MG PO TABS: 650.0000 mg | ORAL_TABLET | ORAL | Status: DC | PRN

## 2016-10-09 MED ORDER — VERAPAMIL HCL 2.5 MG/ML IV SOLN
INTRAVENOUS | Status: DC | PRN
  Administered 2016-10-09: 10 mL via INTRA_ARTERIAL

## 2016-10-09 MED ORDER — SODIUM CHLORIDE 0.9% FLUSH: 3.0000 mL | INTRAVENOUS | Status: DC | PRN

## 2016-10-09 MED ORDER — ASPIRIN EC 81 MG PO TBEC
81.0000 mg | DELAYED_RELEASE_TABLET | Freq: Every evening | ORAL | Status: DC
  Administered 2016-10-09: 81 mg via ORAL
  Filled 2016-10-09: qty 1

## 2016-10-09 MED ORDER — LABETALOL HCL 5 MG/ML IV SOLN: 10.0000 mg | INTRAVENOUS | Status: AC | PRN

## 2016-10-09 MED ORDER — ANGIOPLASTY BOOK
Freq: Once | Status: AC
  Administered 2016-10-09: 22:00:00
  Filled 2016-10-09: qty 1

## 2016-10-09 MED ORDER — SODIUM CHLORIDE 0.9% FLUSH
3.0000 mL | Freq: Two times a day (BID) | INTRAVENOUS | Status: DC
  Administered 2016-10-10: 3 mL via INTRAVENOUS

## 2016-10-09 MED ORDER — SODIUM CHLORIDE 0.9 % IV SOLN: 250.0000 mL | INTRAVENOUS | Status: DC | PRN

## 2016-10-09 MED ORDER — LIDOCAINE HCL (PF) 1 % IJ SOLN
INTRAMUSCULAR | Status: AC
  Filled 2016-10-09: qty 30

## 2016-10-09 MED ORDER — HEPARIN SODIUM (PORCINE) 1000 UNIT/ML IJ SOLN
INTRAMUSCULAR | Status: AC
  Filled 2016-10-09: qty 1

## 2016-10-09 MED ORDER — NITROGLYCERIN 1 MG/10 ML FOR IR/CATH LAB
INTRA_ARTERIAL | Status: DC | PRN
  Administered 2016-10-09: 200 ug via INTRACORONARY

## 2016-10-09 MED ORDER — FENTANYL CITRATE (PF) 100 MCG/2ML IJ SOLN
INTRAMUSCULAR | Status: AC
  Filled 2016-10-09: qty 2

## 2016-10-09 MED ORDER — FENTANYL CITRATE (PF) 100 MCG/2ML IJ SOLN
INTRAMUSCULAR | Status: DC | PRN
  Administered 2016-10-09 (×2): 25 ug via INTRAVENOUS
  Administered 2016-10-09: 50 ug via INTRAVENOUS
  Administered 2016-10-09: 25 ug via INTRAVENOUS

## 2016-10-09 MED ORDER — HEPARIN (PORCINE) IN NACL 2-0.9 UNIT/ML-% IJ SOLN
INTRAMUSCULAR | Status: AC
  Filled 2016-10-09: qty 1000

## 2016-10-09 MED ORDER — LIDOCAINE HCL (PF) 1 % IJ SOLN
INTRAMUSCULAR | Status: DC | PRN
  Administered 2016-10-09: 2 mL

## 2016-10-09 MED ORDER — MIDAZOLAM HCL 2 MG/2ML IJ SOLN
INTRAMUSCULAR | Status: DC | PRN
  Administered 2016-10-09 (×2): 1 mg via INTRAVENOUS
  Administered 2016-10-09 (×2): 2 mg via INTRAVENOUS

## 2016-10-09 MED ORDER — IOPAMIDOL (ISOVUE-370) INJECTION 76%
INTRAVENOUS | Status: AC
  Filled 2016-10-09: qty 50

## 2016-10-09 MED ORDER — SODIUM CHLORIDE 0.9 % WEIGHT BASED INFUSION
1.0000 mL/kg/h | INTRAVENOUS | Status: AC
  Administered 2016-10-09: 18:00:00 1 mL/kg/h via INTRAVENOUS

## 2016-10-09 MED ORDER — IOPAMIDOL (ISOVUE-370) INJECTION 76%
INTRAVENOUS | Status: AC
  Filled 2016-10-09: qty 100

## 2016-10-09 MED ORDER — SODIUM CHLORIDE 0.9 % WEIGHT BASED INFUSION
3.0000 mL/kg/h | INTRAVENOUS | Status: DC
  Administered 2016-10-09: 3 mL/kg/h via INTRAVENOUS

## 2016-10-09 MED ORDER — HEPARIN (PORCINE) IN NACL 2-0.9 UNIT/ML-% IJ SOLN
INTRAMUSCULAR | Status: DC | PRN
  Administered 2016-10-09: 500 mL

## 2016-10-09 MED ORDER — HEPARIN (PORCINE) IN NACL 2-0.9 UNIT/ML-% IJ SOLN
INTRAMUSCULAR | Status: DC | PRN
  Administered 2016-10-09: 15:00:00

## 2016-10-09 MED ORDER — NITROGLYCERIN 1 MG/10 ML FOR IR/CATH LAB
INTRA_ARTERIAL | Status: AC
  Filled 2016-10-09: qty 10

## 2016-10-09 MED ORDER — SODIUM CHLORIDE 0.9% FLUSH: 3.0000 mL | Freq: Two times a day (BID) | INTRAVENOUS | Status: DC

## 2016-10-09 MED ORDER — GABAPENTIN 100 MG PO CAPS
100.0000 mg | ORAL_CAPSULE | Freq: Every day | ORAL | Status: DC
  Filled 2016-10-09 (×2): qty 1

## 2016-10-09 MED ORDER — HYDRALAZINE HCL 20 MG/ML IJ SOLN: 5.0000 mg | INTRAMUSCULAR | Status: AC | PRN

## 2016-10-09 MED ORDER — SODIUM CHLORIDE 0.9 % WEIGHT BASED INFUSION: 1.0000 mL/kg/h | INTRAVENOUS | Status: DC

## 2016-10-09 MED ORDER — LEVOTHYROXINE SODIUM 25 MCG PO TABS
25.0000 ug | ORAL_TABLET | Freq: Every day | ORAL | Status: DC
  Administered 2016-10-10: 10:00:00 25 ug via ORAL
  Filled 2016-10-09: qty 1

## 2016-10-09 MED ORDER — IOPAMIDOL (ISOVUE-370) INJECTION 76%
INTRAVENOUS | Status: DC | PRN
  Administered 2016-10-09: 125 mL

## 2016-10-09 MED ORDER — HEPARIN SODIUM (PORCINE) 1000 UNIT/ML IJ SOLN
INTRAMUSCULAR | Status: DC | PRN
  Administered 2016-10-09: 2000 [IU] via INTRAVENOUS
  Administered 2016-10-09: 5000 [IU] via INTRAVENOUS
  Administered 2016-10-09: 3000 [IU] via INTRAVENOUS
  Administered 2016-10-09: 5000 [IU] via INTRAVENOUS
  Administered 2016-10-09: 4000 [IU] via INTRAVENOUS

## 2016-10-09 SURGICAL SUPPLY — 33 items
BALLN EMERGE MR 2.0X12 (BALLOONS) ×2
BALLN EUPHORA RX 2.5X12 (BALLOONS) ×2
BALLN MOZEC 2.0X15 (BALLOONS) ×2
BALLN MOZEC 2.50X14 (BALLOONS) ×2
BALLN MOZEC 2.75X14 (BALLOONS) ×2
BALLN ~~LOC~~ EMERGE MR 2.5X12 (BALLOONS) ×2
BALLN ~~LOC~~ MOZEC 2.75X15 (BALLOONS) ×2
BALLOON EMERGE MR 2.0X12 (BALLOONS) IMPLANT
BALLOON EUPHORA RX 2.5X12 (BALLOONS) IMPLANT
BALLOON MOZEC 2.0X15 (BALLOONS) IMPLANT
BALLOON MOZEC 2.50X14 (BALLOONS) IMPLANT
BALLOON MOZEC 2.75X14 (BALLOONS) IMPLANT
BALLOON ~~LOC~~ EMERGE MR 2.5X12 (BALLOONS) IMPLANT
BALLOON ~~LOC~~ MOZEC 2.75X15 (BALLOONS) IMPLANT
CATH 5FR JL3.5 JR4 ANG PIG MP (CATHETERS) ×1 IMPLANT
CATH GUIDEZILLA II 6F (CATHETERS) IMPLANT
CATH INFINITI 5 FR JR5 (CATHETERS) ×1 IMPLANT
CATH VISTA GUIDE 6FR XB4.5 (CATHETERS) ×1 IMPLANT
CATH VISTA GUIDE 6FR XBLAD3.5 (CATHETERS) ×1 IMPLANT
CATH VISTA GUIDE 6FR XBLAD4 (CATHETERS) ×1 IMPLANT
CATHETER GUIDEZILLA II 6F (CATHETERS) ×2
DEVICE RAD COMP TR BAND LRG (VASCULAR PRODUCTS) ×1 IMPLANT
GLIDESHEATH SLEND SS 6F .021 (SHEATH) ×1 IMPLANT
GUIDEWIRE INQWIRE 1.5J.035X260 (WIRE) IMPLANT
INQWIRE 1.5J .035X260CM (WIRE) ×2
KIT ENCORE 26 ADVANTAGE (KITS) ×1 IMPLANT
KIT HEART LEFT (KITS) ×2 IMPLANT
PACK CARDIAC CATHETERIZATION (CUSTOM PROCEDURE TRAY) ×2 IMPLANT
SYR MEDRAD MARK V 150ML (SYRINGE) ×2 IMPLANT
TRANSDUCER W/STOPCOCK (MISCELLANEOUS) ×2 IMPLANT
TUBING CIL FLEX 10 FLL-RA (TUBING) ×2 IMPLANT
WIRE ASAHI GRAND SLAM 180CM (WIRE) ×1 IMPLANT
WIRE COUGAR XT STRL 190CM (WIRE) ×2 IMPLANT

## 2016-10-09 NOTE — Interval H&P Note (Signed)
Cath Lab Visit (complete for each Cath Lab visit)  Clinical Evaluation Leading to the Procedure:   ACS: No.  Non-ACS:    Anginal Classification: CCS III  Anti-ischemic medical therapy: No Therapy  Non-Invasive Test Results: No non-invasive testing performed  Prior CABG: No previous CABG  History and Physical Interval Note:  10/09/2016 2:48 PM  Richard Ellis  has presented today for surgery, with the diagnosis of cad - cp - abnormal ekg  The various methods of treatment have been discussed with the patient and family. After consideration of risks, benefits and other options for treatment, the patient has consented to  Procedure(s): Left Heart Cath and Coronary Angiography (N/A) as a surgical intervention .  The patient's history has been reviewed, patient examined, no change in status, stable for surgery.  I have reviewed the patient's chart and labs.  Questions were answered to the patient's satisfaction.     [name]

## 2016-10-09 NOTE — Care Management Note (Signed)
Case Management Note  Patient Details  Name: PHOENIX DREY MRN: [id number] Date of Birth: [birth year]  Subjective/Objective:   S/p unsuccessful pci , will need plavix washout per MD note.  NCM will cont to follow for dc needs.                 Action/Plan:   Expected Discharge Date:                  Expected Discharge Plan:  Home/Self Care  In-House Referral:     Discharge planning Services  CM Consult  Post Acute Care Choice:    Choice offered to:     DME Arranged:    DME Agency:     HH Arranged:    HH Agency:     Status of Service:  In process, will continue to follow  If discussed at Long Length of Stay Meetings, dates discussed:    Additional Comments:  Zenon Mayo, RN 10/09/2016, 7:12 PM

## 2016-10-09 NOTE — H&P (View-Only) (Signed)
Cardiology Office Note Date:  10/08/2016   ID:  Maycon, Grap Feb 25, 1946, MRN [id number]  PCP:  [name], MD  Cardiologist:  [name], MD    Chief Complaint  Patient presents with  . Coronary Artery Disease    1 year follow up     History of Present Illness: Richard Ellis is a 71 y.o. male who presents for follow-up of CAD.The patient was found to have obstructive LAD stenosis proximally one year ago he underwent PCI mid LAD with a drug-eluting stent. He also has a history of PFO and cryptogenic stroke and has been managed with antiplatelet therapy.  The patient reports exertional chest tightness now for several months. States that this is occurred ever since he went back to exercising following his PCI procedure. He's been an avid runner. He ran a 5K in December and had fairly severe chest tightness during the race. This lasted 15-20 minutes afterwards. He has not had any prolonged episodes of chest pain lasting for hours. He does have associated nausea. He denies shortness of breath, heart palpitations, lightheadedness, syncope, orthopnea, or PND. He's had no resting chest pain. He's had no chest pain or pressure with normal activities. He has been able to swim for exercise without chest discomfort.   Past Medical History:  Diagnosis Date  . Allergy    rhinitis  . Anemia    "Hgb always on the low side" (07/02/2013)  . Bronchitis   . CAD (coronary artery disease)    a. LHC 2/17: pLAD 50, mLAD 95, pRCA 30, dRCA 25, EF 50-55% >> PCI:  3 x 18 mm Resolute DES to mLAD   . Chronic bronchitis ([place])    "q year" (07/02/2013)  . Cryptogenic stroke ([place]) 07/02/2013   a. "small ischemic occipital right sided" (07/02/2013)  //  b. Event monitor 10/14: sinus brady  //  c. Carotid US 10/14: bilat ICA 1-39%  . Esophageal stricture   . Heart murmur    "as a child" (07/02/2013)  . History of echocardiogram    a. Echo 10/14: EF 60-65%, no RWMA, normal diastolic function  .  Hyperlipidemia   . Hypothyroidism   . Osteoarthritis    "maybe a little bit in my spine" (07/02/2013)  . PFO (patent foramen ovale)    a. TEE 11/14: mild LVH, EF 60-65%, small PFO, R-L shunt    Past Surgical History:  Procedure Laterality Date  . CARDIAC CATHETERIZATION N/A 11/10/2015   Procedure: Left Heart Cath and Coronary Angiography;  Surgeon: [name], MD;  Location: [place] [facility];  Service: Cardiovascular;  Laterality: N/A;  . CARDIAC CATHETERIZATION N/A 11/10/2015   Procedure: Coronary Stent Intervention;  Surgeon: [name], MD;  Location: [facility];  Service: Cardiovascular;  Laterality: N/A;  mid lad  3.0x18 resolute  . ESOPHAGOGASTRODUODENOSCOPY N/A 10/26/2012   Procedure: ESOPHAGOGASTRODUODENOSCOPY (EGD);  Surgeon: Winfield Cunas., MD;  Location: [facility] ENDOSCOPY;  Service: Endoscopy;  Laterality: N/A;  . ESOPHAGOGASTRODUODENOSCOPY N/A 07/08/2013   Procedure: ESOPHAGOGASTRODUODENOSCOPY (EGD);  Surgeon: Winfield Cunas., MD;  Location: [facility];  Service: Endoscopy;  Laterality: N/A;  . SAVORY DILATION N/A 07/08/2013   Procedure: SAVORY DILATION;  Surgeon: Winfield Cunas., MD;  Location: [facility] ENDOSCOPY;  Service: Endoscopy;  Laterality: N/A;  . TEE WITHOUT CARDIOVERSION N/A 07/28/2013   Procedure: TRANSESOPHAGEAL ECHOCARDIOGRAM (TEE);  Surgeon: Pixie Casino, MD;  Location: [facility] ENDOSCOPY;  Service: Cardiovascular;  Laterality: N/A;  . TONSILLECTOMY  1950's    Current Outpatient Prescriptions  Medication Sig Dispense Refill  . aspirin EC 81 MG tablet Take 1 tablet (81 mg total) by mouth daily. (Patient taking differently: Take 81 mg by mouth every evening. )    . Cholecalciferol 2000 UNITS CAPS Take 2,000 Units by mouth every evening.     . clopidogrel (PLAVIX) 75 MG tablet Take 1 tablet (75 mg total) by mouth daily with breakfast. 30 tablet 11  . Evolocumab (REPATHA SURECLICK) [id number] MG/ML SOAJ Inject 140 mg into the skin every 14 (fourteen)  days. (Patient not taking: Reported on 10/08/2016) 2 pen 11  . gabapentin (NEURONTIN) 100 MG capsule Take 100 mg by mouth daily.    [name] gabapentin (NEURONTIN) 100 MG capsule TAKE ONE CAPSULE BY MOUTH TWICE A DAY (Patient taking differently: take 100mg s every eveining) 60 capsule 3  . levothyroxine (SYNTHROID, LEVOTHROID) 25 MCG tablet Take 1 tablet (25 mcg total) by mouth daily before breakfast. 90 tablet 3  . acetaminophen (TYLENOL) 500 MG tablet Take 1,000 mg by mouth 2 (two) times daily.    . nitroGLYCERIN (NITROSTAT) 0.4 MG SL tablet Place 1 tablet (0.4 mg total) under the tongue every 5 (five) minutes as needed for chest pain. 25 tablet 3  . Omega-3 Fatty Acids (FISH OIL ULTRA) 1400 MG CAPS Take 1,400 mg by mouth 2 (two) times daily.    . Turmeric 500 MG TABS Take 500 mg by mouth every evening.    . vitamin C (ASCORBIC ACID) 500 MG tablet Take 500 mg by mouth daily.     No current facility-administered medications for this visit.     Allergies:   Atorvastatin; Crestor [rosuvastatin]; Demerol [meperidine]; Repatha [evolocumab]; and Niacin and related   Social History:  The patient  reports that he has never smoked. He has never used smokeless tobacco. He reports that he drinks about 4.8 oz of alcohol per week . He reports that he does not use drugs.   Family History:  The patient's  family history includes Coronary artery disease in his other; Heart disease in his father.    ROS:  Please see the history of present illness.  Otherwise, review of systems is positive for back pain.  All other systems are reviewed and negative.    PHYSICAL EXAM: VS:  BP 118/64   Pulse (!) 56   Ht 6' 1.5" (1.867 m)   Wt 183 lb 12.8 oz (83.4 kg)   BMI 23.92 kg/m  , BMI Body mass index is 23.92 kg/m. GEN: Well nourished, well developed, in no acute distress  HEENT: normal  Neck: no JVD, no masses. No carotid bruits Cardiac: RRR without murmur or gallop                Respiratory:  clear to auscultation  bilaterally, normal work of breathing GI: soft, nontender, nondistended, + BS MS: no deformity or atrophy  Ext: no pretibial edema, pedal pulses 2+= bilaterally Skin: warm and dry, no rash Neuro:  Strength and sensation are intact Psych: euthymic mood, full affect  EKG:  EKG is ordered today. The ekg ordered today shows sinus bradycardia, age-indeterminate anteroseptal MI from previous tracing, inferior ST elevation without significant change from previous tracing.  Recent Labs: 11/07/2015: ALT 23 04/19/2016: BUN 22; Creatinine, Ser 1.05; Hemoglobin 12.9; Platelets 178.0; Potassium 4.6; Sodium 139; TSH 1.88   Lipid Panel     Component Value Date/Time   CHOL 122 (L) 04/18/2016 0816   CHOL 230 (H) 11/09/2014 1200  TRIG 115 04/18/2016 0816   TRIG 152 (H) 11/09/2014 1200   TRIG 228 (HH) 07/30/2006 0741   HDL 52 04/18/2016 0816   HDL 64 11/09/2014 1200   CHOLHDL 2.3 04/18/2016 0816   VLDL 23 04/18/2016 0816   LDLCALC 47 04/18/2016 0816   LDLCALC 136 (H) 11/09/2014 1200   LDLDIRECT 122.0 05/09/2015 1006      Wt Readings from Last 3 Encounters:  10/08/16 183 lb 12.8 oz (83.4 kg)  04/19/16 181 lb (82.1 kg)  03/08/16 185 lb (83.9 kg)     Cardiac Studies Reviewed: Cardiac Cath 11-10-2015: Conclusion    Prox RCA lesion, 30% stenosed.  Dist RCA lesion, 25% stenosed.  Prox LAD lesion, 50% stenosed.  The left ventricular systolic function is normal.  Mid LAD lesion, 95% stenosed. Post intervention, there is a 0% residual stenosis.   1. Severe single-vessel coronary artery disease with severe stenosis of the mid LAD, treated successfully with DES implantation  2. Mild nonobstructive disease of the right coronary artery, wide patency of the left circumflex, wide patency of the left main  3. Low normal LV systolic function  Recommendations: The patient should be treated with dual antiplatelet therapy for 12 months. Aggressive risk modification is indicated. The patient has  been statin intolerant to various agents. He should be referred to the lipid clinic for consideration of a PCSK9 inhibitor. He has resting bradycardia and is not a candidate for a beta-blocker.     ASSESSMENT AND PLAN: 1.  CAD, native vessel, with CCS class III angina: The patient has anginal symptoms early on with his jogging. He's had a marked change in his EKG compared to previous. The patient has had an anteroseptal infarct and has marked T-wave abnormality which was not previously present. Considering his classic symptoms with exertional angina, resolving with rest, and known stenting of the LAD in the past, I have recommended repeat cardiac catheterization and possible PCI. I have advised the patient to avoid any exercise or strenuous activity until his heart catheterization is done. He should continue on dual antiplatelet therapy with aspirin and Plavix. The patient has been adherent to his medications.  I have reviewed the risks, indications, and alternatives to cardiac catheterization, possible angioplasty, and stenting with the patient. Risks include but are not limited to bleeding, infection, vascular injury, stroke, myocardial infection, arrhythmia, kidney injury, radiation-related injury in the case of prolonged fluoroscopy use, emergency cardiac surgery, and death. The patient understands the risks of serious complication is 1-2 in 123XX123 with diagnostic cardiac cath and 1-2% or less with angioplasty/stenting.   2. Hyperlipidemia: The patient is statin intolerant. He was intolerant to PCSK9 inhibitors. Will review options with him at the time of his cath procedure tomorrow after review of results.   Current medicines are reviewed with the patient today.  The patient does not have concerns regarding medicines.  Labs/ tests ordered today include:   Orders Placed This Encounter  Procedures  . Basic Metabolic Panel (BMET)  . CBC  . INR/PT  . EKG 12-Lead    Disposition:   FU pending  cath results  Signed, [name], MD  10/08/2016 2:02 PM    [facility] [address], [place], [place]  [address] Phone: [phone]; Fax: [phone]

## 2016-10-10 ENCOUNTER — Ambulatory Visit (HOSPITAL_COMMUNITY): Payer: Federal, State, Local not specified - PPO

## 2016-10-10 ENCOUNTER — Other Ambulatory Visit: Payer: Self-pay | Admitting: *Deleted

## 2016-10-10 ENCOUNTER — Telehealth: Payer: Self-pay | Admitting: Cardiovascular Disease

## 2016-10-10 ENCOUNTER — Other Ambulatory Visit (HOSPITAL_COMMUNITY): Payer: Self-pay | Admitting: *Deleted

## 2016-10-10 ENCOUNTER — Ambulatory Visit (HOSPITAL_BASED_OUTPATIENT_CLINIC_OR_DEPARTMENT_OTHER): Payer: Federal, State, Local not specified - PPO

## 2016-10-10 ENCOUNTER — Encounter (HOSPITAL_COMMUNITY): Payer: Self-pay | Admitting: Cardiovascular Disease

## 2016-10-10 DIAGNOSIS — I25110 Atherosclerotic heart disease of native coronary artery with unstable angina pectoris: Secondary | ICD-10-CM | POA: Diagnosis not present

## 2016-10-10 DIAGNOSIS — E785 Hyperlipidemia, unspecified: Secondary | ICD-10-CM | POA: Diagnosis not present

## 2016-10-10 DIAGNOSIS — I208 Other forms of angina pectoris: Secondary | ICD-10-CM

## 2016-10-10 DIAGNOSIS — Z7982 Long term (current) use of aspirin: Secondary | ICD-10-CM | POA: Diagnosis not present

## 2016-10-10 DIAGNOSIS — Z7902 Long term (current) use of antithrombotics/antiplatelets: Secondary | ICD-10-CM | POA: Diagnosis not present

## 2016-10-10 DIAGNOSIS — Z8249 Family history of ischemic heart disease and other diseases of the circulatory system: Secondary | ICD-10-CM | POA: Diagnosis not present

## 2016-10-10 DIAGNOSIS — M1990 Unspecified osteoarthritis, unspecified site: Secondary | ICD-10-CM | POA: Diagnosis not present

## 2016-10-10 DIAGNOSIS — E039 Hypothyroidism, unspecified: Secondary | ICD-10-CM | POA: Diagnosis not present

## 2016-10-10 DIAGNOSIS — I2510 Atherosclerotic heart disease of native coronary artery without angina pectoris: Secondary | ICD-10-CM

## 2016-10-10 DIAGNOSIS — D649 Anemia, unspecified: Secondary | ICD-10-CM | POA: Diagnosis not present

## 2016-10-10 DIAGNOSIS — I2584 Coronary atherosclerosis due to calcified coronary lesion: Secondary | ICD-10-CM | POA: Diagnosis not present

## 2016-10-10 DIAGNOSIS — I2583 Coronary atherosclerosis due to lipid rich plaque: Secondary | ICD-10-CM | POA: Diagnosis not present

## 2016-10-10 DIAGNOSIS — I25118 Atherosclerotic heart disease of native coronary artery with other forms of angina pectoris: Secondary | ICD-10-CM | POA: Diagnosis not present

## 2016-10-10 DIAGNOSIS — Q211 Atrial septal defect: Secondary | ICD-10-CM | POA: Diagnosis not present

## 2016-10-10 DIAGNOSIS — J42 Unspecified chronic bronchitis: Secondary | ICD-10-CM | POA: Diagnosis not present

## 2016-10-10 DIAGNOSIS — Z8673 Personal history of transient ischemic attack (TIA), and cerebral infarction without residual deficits: Secondary | ICD-10-CM | POA: Diagnosis not present

## 2016-10-10 LAB — CBC
HCT: 33.4 % — ABNORMAL LOW (ref 39.0–52.0)
Hemoglobin: 11.4 g/dL — ABNORMAL LOW (ref 13.0–17.0)
MCH: 31.9 pg (ref 26.0–34.0)
MCHC: 34.1 g/dL (ref 30.0–36.0)
MCV: 93.6 fL (ref 78.0–100.0)
Platelets: 150 10*3/uL (ref 150–400)
RBC: 3.57 MIL/uL — ABNORMAL LOW (ref 4.22–5.81)
RDW: 12.7 % (ref 11.5–15.5)
WBC: 5.6 10*3/uL (ref 4.0–10.5)

## 2016-10-10 LAB — ECHOCARDIOGRAM COMPLETE
Height: 73.5 in
Weight: 2892.44 oz

## 2016-10-10 LAB — BASIC METABOLIC PANEL
Anion gap: 7 (ref 5–15)
BUN: 22 mg/dL — ABNORMAL HIGH (ref 6–20)
CO2: 23 mmol/L (ref 22–32)
Calcium: 8.5 mg/dL — ABNORMAL LOW (ref 8.9–10.3)
Chloride: 107 mmol/L (ref 101–111)
Creatinine, Ser: 1 mg/dL (ref 0.61–1.24)
GFR calc Af Amer: >60 mL/min (ref >60)
GFR calc non Af Amer: >60 mL/min (ref >60)
Glucose, Bld: 102 mg/dL — ABNORMAL HIGH (ref 65–99)
Potassium: 4 mmol/L (ref 3.5–5.1)
Sodium: 137 mmol/L (ref 135–145)

## 2016-10-10 MED ORDER — ASPIRIN EC 325 MG PO TBEC
325.0000 mg | DELAYED_RELEASE_TABLET | Freq: Every evening | ORAL | Status: DC
  Administered 2016-10-10: 325 mg via ORAL
  Filled 2016-10-10: qty 1

## 2016-10-10 NOTE — Progress Notes (Signed)
CARDIAC REHAB PHASE I   PRE:  Rate/Rhythm: 50 SB    BP: sitting 108/55    SaO2:   MODE:  Ambulation: 1500 ft   POST:  Rate/Rhythm: 60 SR    BP: sitting 146/57     SaO2:   Tolerated well, no angina with long distance. Pt sts it would take much more exercise typically to cause angina. Discussed restrictions, walking but not running or pushing himself, NTG, O[facility] preop (sternal precautions, mobility, d/c planning), and CRPII eventually. Voiced understanding. Will watch preop video with wife. Gave him OHS booklet and guideline. [place] CES, ACSM 10/10/2016 9:05 AM

## 2016-10-10 NOTE — Progress Notes (Signed)
  Echocardiogram 2D Echocardiogram has been performed.  Diamond Nickel 10/10/2016, 12:22 PM

## 2016-10-10 NOTE — Progress Notes (Signed)
Progress Note  Patient Name: Richard Ellis Date of Encounter: 10/10/2016  Primary Cardiologist: Dr. Burt Knack  Subjective   Feeling well this morning. No chest pain or dyspnea.   Inpatient Medications    Scheduled Meds: . aspirin EC  81 mg Oral QPM  . gabapentin  100 mg Oral Daily  . levothyroxine  25 mcg Oral QAC breakfast  . sodium chloride flush  3 mL Intravenous Q12H   Continuous Infusions:  PRN Meds: sodium chloride, acetaminophen, ondansetron (ZOFRAN) IV, sodium chloride flush   Vital Signs    Vitals:   10/09/16 1900 10/09/16 2000 10/09/16 2100 10/10/16 0610  BP: (!) 124/58 (!) 115/51 (!) 114/50 (!) 115/51  Pulse: (!) 54 (!) 51 (!) 57 (!) 43  Resp: 18 17 18 17   Temp:    98.5 F (36.9 C)  TempSrc:    Oral  SpO2: 99% 98% 100% 98%  Weight:    180 lb 12.4 oz (82 kg)  Height:        Intake/Output Summary (Last 24 hours) at 10/10/16 0807 Last data filed at 10/10/16 0300  Gross per 24 hour  Intake           1574.4 ml  Output                0 ml  Net           1574.4 ml   Filed Weights   10/09/16 1231 10/10/16 0610  Weight: 180 lb (81.6 kg) 180 lb 12.4 oz (82 kg)    Telemetry    SB - Personally Reviewed  ECG    SB with biphasic T wave in anterolateral leads- Personally Reviewed  Physical Exam   GEN: No acute distress.  Neck: No JVD Cardiac: RRR, no murmurs, rubs, or gallops.  Respiratory: Clear to auscultation bilaterally. GI: Soft, nontender, non-distended  MS: No edema; No deformity. Right radial site without bruising or hematoma Neuro:  AAOx3. Psych: Normal affect  Labs    Chemistry Recent Labs Lab 10/08/16 1010 10/10/16 0244  NA 139 137  K 4.4 4.0  CL 101 107  CO2 26 23  GLUCOSE 96 102*  BUN 18 22*  CREATININE 0.99 1.00  CALCIUM 9.3 8.5*  GFRNONAA 77 >60  GFRAA 89 >60  ANIONGAP  --  7     Hematology Recent Labs Lab 10/08/16 1010 10/10/16 0244  WBC 5.0 5.6  RBC 4.10* 3.57*  HGB  --  11.4*  HCT 39.2 33.4*  MCV 96 93.6    MCH 31.5 31.9  MCHC 32.9 34.1  RDW 13.7 12.7  PLT 202 150    Cardiac EnzymesNo results for input(s): TROPONINI in the last 168 hours. No results for input(s): TROPIPOC in the last 168 hours.   BNPNo results for input(s): BNP, PROBNP in the last 168 hours.   DDimer No results for input(s): DDIMER in the last 168 hours.   Radiology    No results found.  Cardiac Studies   LHC: 10/09/16  Conclusion   1. Severe mid LAD stenosis with complex disease because of heavy calcification and angulation of the vessel 2. Widely patent left main, left circumflex, and right coronary artery with mild diffuse nonobstructive disease 3. Mild segmental LV dysfunction with distal anterior and apical hypokinesis, LVEF estimated at 50%. 4. Unsuccessful PCI of the LAD despite prolonged effort at balloon dilatation using multiple balloons and guidewires, supportive guide catheters and a Guidezilla, and repeated dilatations with both; compliant and noncompliant balloons.  Procedure was unsuccessful because of inability to deliver a stent.  At the completion of the procedure there is a non--flow-limiting dissection present. There is TIMI 3 flow and the patient is chest pain-free. Anticipate a period of Plavix washout. If the patient is stable tomorrow without symptoms on ambulation, it would be reasonable to allow him to go home with plans for surgical consultation and LIMA to LAD bypass in the near future after Plavix washout.    Patient Profile     71 y.o. male with PMH of CAD, HLD, Hypothyroidism, PFO, CVA who presented for outpatient cath with Dr. Burt Knack after experiencing exertional anginal symptoms with noted EKG changes.   Assessment & Plan    1. CAD: Underwent LHC yesterday with Dr. Burt Knack showing 90% stenosis in the pLAD above previously. Unsuccessful PCI of the LAD, but was able to reduce lesion from 90-60%. No reported chest pain this morning. Plans to walk with cardiac rehab.  -- Planned to hold  plavix for washout and have a CVTS consult as outpatient for possible LIMA to the LAD bypass.  -- Morning labs stable.  -- Increase ASA to 325mg  per Dr. Burt Knack.   2. HLD: Statin intolerant, and PCSK9 inhibitor intolerant.   Signed, Reino Bellis, NP  10/10/2016, 8:07 AM    Personally seen and examined. Agree with above.  Plan is to have him see TCTS as outpatient.  Holding Plavix. Dr. Burt Knack plan reviewed. No current CP. Ambulated without difficulty.   Radial site normal.   Candee Furbish, MD

## 2016-10-10 NOTE — Telephone Encounter (Signed)
TOC F/U .[name] Appt is on 10/15/16 at 8:45am w/ Richardson Dopp . Thanks

## 2016-10-10 NOTE — Discharge Summary (Signed)
Discharge Summary    Patient ID: Richard Ellis,  MRN: [id number], DOB/AGE: 05-08-1946 72 y.o.  Admit date: 10/09/2016 Discharge date: 10/11/2016  Primary Care Provider: [name] Primary Cardiologist: Dr. Burt Knack  Discharge Diagnoses    Active Problems:   Coronary artery disease with exertional angina [facility])   Hyperlipidemia   Allergies Allergies  Allergen Reactions  . Atorvastatin Other (See Comments)    Muscle weakness  . Crestor [Rosuvastatin]     Muscle weakness  . Demerol [Meperidine] Nausea Only  . Repatha [Evolocumab]     Muscle weakness  . Niacin And Related Itching, Rash and Other (See Comments)    Palms turned red and itched    Diagnostic Studies/Procedures    LHC: 10/09/16     1. Severe mid LAD stenosis with complex disease because of heavy calcification and angulation of the vessel 2. Widely patent left main, left circumflex, and right coronary artery with mild diffuse nonobstructive disease 3. Mild segmental LV dysfunction with distal anterior and apical hypokinesis, LVEF estimated at 50%. 4. Unsuccessful PCI of the LAD despite prolonged effort at balloon dilatation using multiple balloons and guidewires, supportive guide catheters and a Guidezilla, and repeated dilatations with both; compliant and noncompliant balloons. Procedure was unsuccessful because of inability to deliver a stent.  At the completion of the procedure there is a non--flow-limiting dissection present. There is TIMI 3 flow and the patient is chest pain-free. Anticipate a period of Plavix washout. If the patient is stable tomorrow without symptoms on ambulation, it would be reasonable to allow him to go home with plans for surgical consultation and LIMA to LAD bypass in the near future after Plavix washout.   _____________   History of Present Illness     71 y.o. male who presents for follow-up of CAD.The patient was found to have obstructive LAD stenosis proximally one year ago he  underwent PCI mid LAD with a drug-eluting stent. He also has a history of PFO and cryptogenic stroke and has been managed with antiplatelet therapy.  The patient was seen in the office on 10/08/16 and reported exertional chest tightness now for several months. Stated that this is occurred ever since he went back to exercising following his PCI procedure. He's been an avid runner. He ran a 5K in December and had fairly severe chest tightness during the race. This lasted 15-20 minutes afterwards. He has not had any prolonged episodes of chest pain lasting for hours. He does have associated nausea. He denies shortness of breath, heart palpitations, lightheadedness, syncope, orthopnea, or PND. He's had no resting chest pain. He's had no chest pain or pressure with normal activities. He has been able to swim for exercise without chest discomfort. EKG in the office that day showed a new marked T wave abnormality that was new. Decision was made to proceed with with LHC to redefine anatomy.   Hospital Course     Consultants: CVTS  He underwent LHC with Dr. Burt Knack showing severe mLAD stenosis with complex disease with 90% lesion above previously placed stent. Unsuccessful PCI of the LAD with non-flow-limiting dissection present. His plavix was held with plans for CVTS consult regarding the need of a LIMA to the LAD.   His labs were stable post cath. Was able to walk in the hallway without any angina or dyspnea. CVTS was consulted prior to discharge. He had a normal CXR and Echo showed EF 60-65% with no WMA, G1DD with moderate LVH and moderate LAE.  He was seen by Dr. Prescott Gum prior to discharge.   He was seen by Dr. Drue Flirt. Burt Knack and determined stable for discharge. His plavix will be held at discharge and his ASA was increased to 325mg  daily. His surgery is planned for Monday 10/15/16 that has been scheduled by CT surgery. This was discussed with the patient prior to discharge.  _____________  Discharge  Vitals Blood pressure (!) 104/59, pulse (!) 46, temperature 98.3 F (36.8 C), temperature source Oral, resp. rate 18, height 6' 1.5" (1.867 m), weight 180 lb 12.4 oz (82 kg), SpO2 96 %.  Filed Weights   10/09/16 1231 10/10/16 0610  Weight: 180 lb (81.6 kg) 180 lb 12.4 oz (82 kg)    Labs & Radiologic Studies    CBC  Recent Labs  10/10/16 0244 10/11/16 0836  WBC 5.6 5.2  HGB 11.4* 12.9*  HCT 33.4* 38.9*  MCV 93.6 94.2  PLT 150 0000000   Basic Metabolic Panel  Recent Labs  10/10/16 0244 10/11/16 0836  NA 137 140  K 4.0 3.8  CL 107 107  CO2 23 25  GLUCOSE 102* 107*  BUN 22* 18  CREATININE 1.00 0.99  CALCIUM 8.5* 9.5   Liver Function Tests  Recent Labs  10/11/16 0836  AST 27  ALT 20  ALKPHOS 39  BILITOT 0.7  PROT 6.3*  ALBUMIN 4.0   No results for input(s): LIPASE, AMYLASE in the last 72 hours. Cardiac Enzymes No results for input(s): CKTOTAL, CKMB, CKMBINDEX, TROPONINI in the last 72 hours. BNP Invalid input(s): POCBNP D-Dimer No results for input(s): DDIMER in the last 72 hours. Hemoglobin A1C No results for input(s): HGBA1C in the last 72 hours. Fasting Lipid Panel No results for input(s): CHOL, HDL, LDLCALC, TRIG, CHOLHDL, LDLDIRECT in the last 72 hours. Thyroid Function Tests No results for input(s): TSH, T4TOTAL, T3FREE, THYROIDAB in the last 72 hours.  Invalid input(s): FREET3 _____________  Dg Chest 2 View  Result Date: 10/10/2016 CLINICAL DATA:  Severe coronary artery disease noted on yesterday's cardiac catheterization, preop for CABG EXAM: CHEST  2 VIEW COMPARISON:  Chest x-ray of 04/23/2016 FINDINGS: No active infiltrate or effusion is seen. Mediastinal and hilar contours are unremarkable. The heart is within normal limits in size. No bony abnormality is seen. IMPRESSION: No active cardiopulmonary disease. Electronically Signed   By: Ivar Drape M.D.   On: 10/10/2016 11:54   Disposition   Pt is being discharged home today in good  condition.  Follow-up Plans & Appointments    Follow-up Information    [facility] Follow up on 10/15/2016.   Why:  Please be at short stay at 5:00am to be prepared for your surgery. Please do not take any more of your plavix prior to surgery. You need to be without food/drink after midnight.  Contact information: [address] [id number] [facility] [phone]         Discharge Instructions    Diet - low sodium heart healthy    Complete by:  As directed    Increase activity slowly    Complete by:  As directed       Discharge Medications   Discharge Medication List as of 10/10/2016  6:55 PM    CONTINUE these medications which have NOT CHANGED   Details  acetaminophen (TYLENOL) 500 MG tablet Take 1,000 mg by mouth 2 (two) times daily., Historical Med    Cholecalciferol 2000 UNITS CAPS Take 2,000 Units by mouth  every evening. , Historical Med    gabapentin (NEURONTIN) 100 MG capsule TAKE ONE CAPSULE BY MOUTH TWICE A DAY, Normal    levothyroxine (SYNTHROID, LEVOTHROID) 25 MCG tablet Take 1 tablet (25 mcg total) by mouth daily before breakfast., Starting Thu 04/19/2016, Normal    nitroGLYCERIN (NITROSTAT) 0.4 MG SL tablet Place 1 tablet (0.4 mg total) under the tongue every 5 (five) minutes as needed for chest pain., Starting Mon 10/08/2016, Until Sun 01/06/2017, Normal    Omega-3 Fatty Acids (FISH OIL ULTRA) 1400 MG CAPS Take 1,400 mg by mouth 2 (two) times daily., Historical Med    Turmeric 500 MG TABS Take 500 mg by mouth every evening., Historical Med    vitamin C (ASCORBIC ACID) 500 MG tablet Take 500 mg by mouth daily., Historical Med    aspirin 325 MG EC tablet Take 325 mg by mouth daily., Historical Med      STOP taking these medications     clopidogrel (PLAVIX) 75 MG tablet           Outstanding Labs/Studies   None   Duration of Discharge Encounter   Greater than 30 minutes including physician  time.  Signed, Reino Bellis NP-C 10/11/2016, 11:49 AM  Personally seen and examined. Agree with above.  Plan is to have him see TCTS as outpatient.  Holding Plavix. Dr. Burt Knack plan reviewed. No current CP. Ambulated without difficulty.   Radial site normal.   Candee Furbish, MD

## 2016-10-10 NOTE — Progress Notes (Signed)
TR BAND REMOVAL  LOCATION:    right radial  DEFLATED PER PROTOCOL:    Yes.    TIME BAND OFF / DRESSING APPLIED:    23:00   SITE UPON ARRIVAL:    Level 0  SITE AFTER BAND REMOVAL:    Level 0  CIRCULATION SENSATION AND MOVEMENT:    Within Normal Limits   Yes.    COMMENTS:   Post TR band instructions given. Pt tolerated well. 

## 2016-10-10 NOTE — Consult Note (Signed)
[address]       [place] [address]             [phone]        [name] [facility]era Medical Record [id number] Date of Birth: 11/19/1945  Referring: No ref. provider found Primary Care: [name], MD  Chief Complaint:   No chief complaint on file. Patient examined, coronary gram and 2-D echocardiogram personally reviewed and counseled with patient  History of Present Illness:    71 year old active Caucasian male with family history positive for CAD presents with recurrent angina with exertion one year after PCI of the mid LAD. Coronary angiogram shows high-grade proximal stenosis at the bifurcation with a large diagonal. Attempt at PCI was unsuccessful due to the calcified structure of the vessel and CABG was recommended.  The patient also has a known history of PFO with a cryptogenic stroke in 2014 on chronic Plavix.  Echo shows normal LV function with no significant valvular abnormalities. Surgical coronary revascularization with combined closure of PFO has been recommended after Plavix washout.  I have discussed the procedure in detail with the patient and his wife. We will plan left eye may graft to the LAD, saphenous vein graft to the diagonal and direct closure of the PFO.  Current Activity/ Functional Status: Patient is retired. Nonsmoker. Remains active and is a avid runner He lives with his wife who is a physical therapist to works in the [employer] [facility]   Zubrod Score: At the time of surgery this patient's most appropriate activity status/level should be described as: []     0    Normal activity, no symptoms [x]     1    Restricted in physical strenuous activity but ambulatory, able to do out light work []     2    Ambulatory and capable of self care, unable to do work activities, up and about                 more than 50%  Of the time                            []     3    Only limited self care, in bed greater than 50%  of waking hours []     4    Completely disabled, no self care, confined to bed or chair []     5    Moribund  Past Medical History:  Diagnosis Date  . Allergy    rhinitis  . Anemia    "Hgb always on the low side" (07/02/2013)  . CAD (coronary artery disease)    a. LHC 2/17: pLAD 50, mLAD 95, pRCA 30, dRCA 25, EF 50-55% >> PCI:  3 x 18 mm Resolute DES to mLAD   . Chronic bronchitis ([place])    "not in the last year since I started taking allergy shots" (10/09/2016)  . Chronic lower back pain   . Cryptogenic stroke ([place]) 07/02/2013   a. "small ischemic occipital right sided" (07/02/2013)  //  b. Event monitor 10/14: sinus brady  //  c. Carotid US 10/14: bilat ICA 1-39%  . Esophageal stricture   . Heart murmur    "as a child" (07/02/2013)  . History of echocardiogram    a. Echo 10/14: EF 60-65%, no RWMA, normal diastolic function  . Hyperlipidemia   . Hypothyroidism   . Osteoarthritis    "  lower back" (10/09/2016)  . PFO (patent foramen ovale)    a. TEE 11/14: mild LVH, EF 60-65%, small PFO, R-L shunt    Past Surgical History:  Procedure Laterality Date  . CARDIAC CATHETERIZATION N/A 11/10/2015   Procedure: Left Heart Cath and Coronary Angiography;  Surgeon: [name], MD;  Location: [facility];  Service: Cardiovascular;  Laterality: N/A;  . CARDIAC CATHETERIZATION N/A 11/10/2015   Procedure: Coronary Stent Intervention;  Surgeon: [name], MD;  Location: [place] [facility];  Service: Cardiovascular;  Laterality: N/A;  mid lad  3.0x18 resolute  . CARDIAC CATHETERIZATION  10/09/2016   "scheduled OHS for tomorrow" (10/09/2016)  . CARDIAC CATHETERIZATION N/A 10/09/2016   Procedure: Left Heart Cath and Coronary Angiography;  Surgeon: [name], MD;  Location: [place];  Service: Cardiovascular;  Laterality: N/A;  . CARDIAC CATHETERIZATION N/A 10/09/2016   Procedure: Coronary Balloon Angioplasty;  Surgeon: [name], MD;  Location: [facility];   Service: Cardiovascular;  Laterality: N/A;  . ESOPHAGOGASTRODUODENOSCOPY N/A 10/26/2012   Procedure: ESOPHAGOGASTRODUODENOSCOPY (EGD);  Surgeon: Winfield Cunas., MD;  Location: [facility];  Service: Endoscopy;  Laterality: N/A;  . ESOPHAGOGASTRODUODENOSCOPY N/A 07/08/2013   Procedure: ESOPHAGOGASTRODUODENOSCOPY (EGD);  Surgeon: Winfield Cunas., MD;  Location: [facility];  Service: Endoscopy;  Laterality: N/A;  . INNER EAR SURGERY Left 1957   "related to ear infection"  . SAVORY DILATION N/A 07/08/2013   Procedure: SAVORY DILATION;  Surgeon: Winfield Cunas., MD;  Location: [facility];  Service: Endoscopy;  Laterality: N/A;  . TEE WITHOUT CARDIOVERSION N/A 07/28/2013   Procedure: TRANSESOPHAGEAL ECHOCARDIOGRAM (TEE);  Surgeon: Pixie Casino, MD;  Location: [facility];  Service: Cardiovascular;  Laterality: N/A;  . TONSILLECTOMY  1950's    History  Smoking Status  . Never Smoker  Smokeless Tobacco  . Never Used    History  Alcohol Use  . 4.8 oz/week  . 4 Glasses of wine, 4 Cans of beer per week    Social History   Social History  . Marital status: Married    Spouse name: N/A  . Number of children: N/A  . Years of education: N/A   Occupational History  . Not on file.   Social History Main Topics  . Smoking status: Never Smoker  . Smokeless tobacco: Never Used  . Alcohol use 4.8 oz/week    4 Glasses of wine, 4 Cans of beer per week  . Drug use: No  . Sexual activity: Yes   Other Topics Concern  . Not on file   Social History Narrative  . No narrative on file    Allergies  Allergen Reactions  . Atorvastatin Other (See Comments)    Muscle weakness  . Crestor [Rosuvastatin]     Muscle weakness  . Demerol [Meperidine] Nausea Only  . Repatha [Evolocumab]     Muscle weakness  . Niacin And Related Itching, Rash and Other (See Comments)    Palms turned red and itched    Current Facility-Administered Medications  Medication Dose Route Frequency  Provider Last Rate Last Dose  . 0.9 %  sodium chloride infusion  250 mL Intravenous PRN [name], MD      . acetaminophen (TYLENOL) tablet 650 mg  650 mg Oral Q4H PRN [name], MD      . aspirin EC tablet 325 mg  325 mg Oral QPM Cheryln Manly, NP   325 mg at 10/10/16 1110  . gabapentin (  NEURONTIN) capsule 100 mg  100 mg Oral Daily [name], MD      . levothyroxine (SYNTHROID, LEVOTHROID) tablet 25 mcg  25 mcg Oral QAC breakfast [name], MD   25 mcg at 10/10/16 0944  . ondansetron (ZOFRAN) injection 4 mg  4 mg Intravenous Q6H PRN [name], MD      . sodium chloride flush (NS) 0.9 % injection 3 mL  3 mL Intravenous Q12H [name], MD   3 mL at 10/10/16 0945  . sodium chloride flush (NS) 0.9 % injection 3 mL  3 mL Intravenous PRN [name], MD        Prescriptions Prior to Admission  Medication Sig Dispense Refill Last Dose  . acetaminophen (TYLENOL) 500 MG tablet Take 1,000 mg by mouth 2 (two) times daily.   Past Week at Unknown time  . Cholecalciferol 2000 UNITS CAPS Take 2,000 Units by mouth every evening.    10/08/2016 at Unknown time  . clopidogrel (PLAVIX) 75 MG tablet Take 1 tablet (75 mg total) by mouth daily with breakfast. 30 tablet 11 10/09/2016 at 0700  . gabapentin (NEURONTIN) 100 MG capsule TAKE ONE CAPSULE BY MOUTH TWICE A DAY (Patient taking differently: take 100mg s every eveining) 60 capsule 3 Taking  . levothyroxine (SYNTHROID, LEVOTHROID) 25 MCG tablet Take 1 tablet (25 mcg total) by mouth daily before breakfast. 90 tablet 3 10/09/2016 at 0800  . nitroGLYCERIN (NITROSTAT) 0.4 MG SL tablet Place 1 tablet (0.4 mg total) under the tongue every 5 (five) minutes as needed for chest pain. 25 tablet 3   . Omega-3 Fatty Acids (FISH OIL ULTRA) 1400 MG CAPS Take 1,400 mg by mouth 2 (two) times daily.   10/09/2016 at 0700  . Turmeric 500 MG TABS Take 500 mg by mouth every evening.   10/08/2016 at Unknown time  . vitamin C (ASCORBIC ACID) 500 MG tablet  Take 500 mg by mouth daily.   10/09/2016 at 0700  . aspirin 325 MG EC tablet Take 325 mg by mouth daily.       Family History  Problem Relation Age of Onset  . Coronary artery disease Other     male first degree relative <60  . Heart disease Father     heart attack  . Colon cancer Neg Hx      Review of Systems:       Cardiac Review of Systems: Y or N  Chest Pain [   Yes ]  Resting SOB [   ] Exertional SOB  [  ]  Orthopnea [  ]   Pedal Edema [   ]    Palpitations [  ] Syncope  [  ]   Presyncope [   ]  General Review of Systems: [Y] = yes [  ]=no Constitional: recent weight change [  ]; anorexia [  ]; fatigue [  ]; nausea [  ]; night sweats [  ]; fever [  ]; or chills [  ]                                                               Dental: poor dentition[  ]; Last Dentist visit:   Eye : blurred vision [  ]; diplopia [   ]; vision changes [  ];  Amaurosis fugax[  ]; Resp: cough [  ];  wheezing[  ];  hemoptysis[  ]; shortness of breath[  ]; paroxysmal nocturnal dyspnea[  ]; dyspnea on exertion[  ]; or orthopnea[  ];  GI:  gallstones[  ], vomiting[  ];  dysphagia[  ]; melena[  ];  hematochezia [  ]; heartburn[  ];   Hx of  Colonoscopy[  ]; GU: kidney stones [  ]; hematuria[  ];   dysuria [  ];  nocturia[  ];  history of     obstruction [  ]; urinary frequency [  ]             Skin: rash, swelling[  ];, hair loss[  ];  peripheral edema[  ];  or itching[  ]; Musculosketetal: myalgias[  ];  joint swelling[  ];  joint erythema[  ];  joint pain[  ];  back pain[ yes ];  Heme/Lymph: bruising[  yes on Plavix];  bleeding[ yes on Plavix  ];  anemia[  ];  Neuro: TIA[  ];  headaches[  ];  stroke[ he has cryptogenic  ];  vertigo[  ];  seizures[  ];   paresthesias[  ];  difficulty walking[  ];  Psych:depression[  ]; anxiety[  ];  Endocrine: diabetes[  ];  thyroid dysfunction[  ];  Immunizations: Flu [  ]; Pneumococcal[  ];  Other: Right-hand dominant                         No previous general  anesthesia  Physical Exam: BP (!) 104/59   Pulse (!) 46   Temp 98.3 F (36.8 C) (Oral)   Resp 18   Ht 6' 1.5" (1.867 m)   Wt 180 lb 12.4 oz (82 kg)   SpO2 96%   BMI 23.53 kg/m        Physical Exam  General: Well-developed healthy appearing middle-aged male no acute distress HEENT: Normocephalic pupils equal , dentition adequate Neck: Supple without JVD, adenopathy, or bruit Chest: Clear to auscultation, symmetrical breath sounds, no rhonchi, no tenderness             or deformity Cardiovascular: Regular rate and rhythm, no murmur, no gallop, peripheral pulses             palpable in all extremities Abdomen:  Soft, nontender, no palpable mass or organomegaly Extremities: Warm, well-perfused, no clubbing cyanosis edema or tenderness,              no venous stasis changes of the legs Rectal/GU: Deferred Neuro: Grossly non--focal and symmetrical throughout Skin: Clean and dry without rash or ulceration   Diagnostic Studies & Laboratory data:     Recent Radiology Findings:   Dg Chest 2 View  Result Date: 10/10/2016 CLINICAL DATA:  Severe coronary artery disease noted on yesterday's cardiac catheterization, preop for CABG EXAM: CHEST  2 VIEW COMPARISON:  Chest x-ray of 04/23/2016 FINDINGS: No active infiltrate or effusion is seen. Mediastinal and hilar contours are unremarkable. The heart is within normal limits in size. No bony abnormality is seen. IMPRESSION: No active cardiopulmonary disease. Electronically Signed   By: Ivar Drape M.D.   On: 10/10/2016 11:54     I have independently reviewed the above radiologic studies.  Recent Lab Findings: Lab Results  Component Value Date   WBC 5.6 10/10/2016   HGB 11.4 (L) 10/10/2016   HCT 33.4 (L) 10/10/2016   PLT 150 10/10/2016   GLUCOSE 102 (  H) 10/10/2016   CHOL 122 (L) 04/18/2016   TRIG 115 04/18/2016   HDL 52 04/18/2016   LDLDIRECT 122.0 05/09/2015   LDLCALC 47 04/18/2016   ALT 23 11/07/2015   AST 27 11/07/2015   NA  137 10/10/2016   K 4.0 10/10/2016   CL 107 10/10/2016   CREATININE 1.00 10/10/2016   BUN 22 (H) 10/10/2016   CO2 23 10/10/2016   TSH 1.88 04/19/2016   INR 1.0 10/08/2016   HGBA1C 5.8 02/02/2014      Assessment / Plan:     Recurrent angina with high-grade proximal LAD stenosis at the bifurcation of the large diagonal. Patent foramen ovale with history of cryptogenic stroke  Plan CABG left IMA to LAD and vein graft to diagonal and reclosure of patent foramen ovale Monday, January 29. Procedure indications benefits and risks fully discussed with patient and wife. They understand and agree to proceed with surgery. Plavix will be held.    @ME1 @ 10/10/2016 6:59 PM

## 2016-10-10 NOTE — Pre-Procedure Instructions (Signed)
    Richard Ellis  10/10/2016      [facility] [name], [place] - [id number] [place] [id number] Phone: [phone] Fax: [phone]  [name] [place] to [place] [address] Phone: [phone] Fax: [place], [place] - [address] [place] [address] Phone: [phone] Fax: [phone]  [facility] [place] [place] [address] Phone: [phone] Fax: [phone]    Your procedure is scheduled on 10/15/16.  Report to [facility] Admitting at 530 A.M.  Call this number if you have problems the morning of surgery:  [phone]   Remember:  Do not eat food or drink liquids after midnight.  Take these medicines the morning of surgery with A SIP OF WATER --tylenol,neuronitn,synthroid   Do not wear jewelry, make-up or nail polish.  Do not wear lotions, powders, or perfumes, or deoderant.  Do not shave 48 hours prior to surgery.  Men may shave face and neck.  Do not bring valuables to the hospital.  [facility] is not responsible for any belongings or valuables.  Contacts, dentures or bridgework may not be worn into surgery.  Leave your suitcase in the car.  After surgery it may be brought to your room.  For patients admitted to the hospital, discharge time will be determined by your treatment team.  Patients discharged the day of surgery will not be allowed to drive home.   Name and phone number of your driver:   Special instructions:  No nsaids  Please read over the following fact sheets that you were given. MRSA Information

## 2016-10-11 ENCOUNTER — Ambulatory Visit (HOSPITAL_BASED_OUTPATIENT_CLINIC_OR_DEPARTMENT_OTHER)
Admission: RE | Admit: 2016-10-11 | Discharge: 2016-10-11 | Disposition: A | Discharge status: Home / Self Care | Payer: Federal, State, Local not specified - PPO | Source: Ambulatory Visit | Attending: Cardiothoracic Surgery | Admitting: Cardiothoracic Surgery

## 2016-10-11 ENCOUNTER — Other Ambulatory Visit: Payer: Self-pay | Admitting: *Deleted

## 2016-10-11 ENCOUNTER — Encounter (HOSPITAL_COMMUNITY): Payer: Self-pay

## 2016-10-11 ENCOUNTER — Ambulatory Visit (HOSPITAL_COMMUNITY)
Admission: RE | Admit: 2016-10-11 | Discharge: 2016-10-11 | Disposition: A | Discharge status: Home / Self Care | Payer: Federal, State, Local not specified - PPO | Source: Ambulatory Visit | Attending: Cardiothoracic Surgery | Admitting: Cardiothoracic Surgery

## 2016-10-11 ENCOUNTER — Encounter (HOSPITAL_COMMUNITY)
Admission: RE | Admit: 2016-10-11 | Discharge: 2016-10-11 | Disposition: A | Discharge status: Home / Self Care | Payer: Federal, State, Local not specified - PPO | Source: Ambulatory Visit | Attending: Cardiothoracic Surgery | Admitting: Cardiothoracic Surgery

## 2016-10-11 DIAGNOSIS — Q211 Atrial septal defect: Secondary | ICD-10-CM | POA: Diagnosis not present

## 2016-10-11 DIAGNOSIS — I2510 Atherosclerotic heart disease of native coronary artery without angina pectoris: Secondary | ICD-10-CM | POA: Diagnosis not present

## 2016-10-11 DIAGNOSIS — I2584 Coronary atherosclerosis due to calcified coronary lesion: Secondary | ICD-10-CM | POA: Diagnosis not present

## 2016-10-11 DIAGNOSIS — I25118 Atherosclerotic heart disease of native coronary artery with other forms of angina pectoris: Secondary | ICD-10-CM | POA: Diagnosis not present

## 2016-10-11 DIAGNOSIS — E785 Hyperlipidemia, unspecified: Secondary | ICD-10-CM | POA: Diagnosis not present

## 2016-10-11 LAB — VAS US DOPPLER PRE CABG
LEFT ECA DIAS: -17 cm/s
LEFT VERTEBRAL DIAS: 18 cm/s
Left CCA dist dias: -20 cm/s
Left CCA dist sys: -120 cm/s
Left CCA prox dias: 19 cm/s
Left CCA prox sys: 94 cm/s
Left ICA dist dias: -36 cm/s
Left ICA dist sys: -96 cm/s
Left ICA prox dias: -19 cm/s
Left ICA prox sys: -63 cm/s
RIGHT ECA DIAS: -16 cm/s
RIGHT VERTEBRAL DIAS: 13 cm/s
Right CCA prox dias: 16 cm/s
Right CCA prox sys: 86 cm/s
Right cca dist sys: -88 cm/s

## 2016-10-11 LAB — COMPREHENSIVE METABOLIC PANEL
ALT: 20 U/L (ref 17–63)
AST: 27 U/L (ref 15–41)
Albumin: 4 g/dL (ref 3.5–5.0)
Alkaline Phosphatase: 39 U/L (ref 38–126)
Anion gap: 8 (ref 5–15)
BUN: 18 mg/dL (ref 6–20)
CO2: 25 mmol/L (ref 22–32)
Calcium: 9.5 mg/dL (ref 8.9–10.3)
Chloride: 107 mmol/L (ref 101–111)
Creatinine, Ser: 0.99 mg/dL (ref 0.61–1.24)
GFR calc Af Amer: >60 mL/min (ref >60)
GFR calc non Af Amer: >60 mL/min (ref >60)
Glucose, Bld: 107 mg/dL — ABNORMAL HIGH (ref 65–99)
Potassium: 3.8 mmol/L (ref 3.5–5.1)
Sodium: 140 mmol/L (ref 135–145)
Total Bilirubin: 0.7 mg/dL (ref 0.3–1.2)
Total Protein: 6.3 g/dL — ABNORMAL LOW (ref 6.5–8.1)

## 2016-10-11 LAB — CBC
HCT: 38.9 % — ABNORMAL LOW (ref 39.0–52.0)
Hemoglobin: 12.9 g/dL — ABNORMAL LOW (ref 13.0–17.0)
MCH: 31.2 pg (ref 26.0–34.0)
MCHC: 33.2 g/dL (ref 30.0–36.0)
MCV: 94.2 fL (ref 78.0–100.0)
Platelets: 162 10*3/uL (ref 150–400)
RBC: 4.13 MIL/uL — ABNORMAL LOW (ref 4.22–5.81)
RDW: 12.8 % (ref 11.5–15.5)
WBC: 5.2 10*3/uL (ref 4.0–10.5)

## 2016-10-11 LAB — URINALYSIS, ROUTINE W REFLEX MICROSCOPIC
Bilirubin Urine: NEGATIVE
Glucose, UA: NEGATIVE mg/dL
Hgb urine dipstick: NEGATIVE
Ketones, ur: NEGATIVE mg/dL
Leukocytes, UA: NEGATIVE
Nitrite: NEGATIVE
Protein, ur: NEGATIVE mg/dL
Specific Gravity, Urine: 1.013 (ref 1.005–1.030)
pH: 5 (ref 5.0–8.0)

## 2016-10-11 LAB — PULMONARY FUNCTION TEST
DL/VA % pred: 82 %
DL/VA: 3.96 ml/min/mmHg/L
DLCO cor % pred: 76 %
DLCO cor: 27.69 ml/min/mmHg
DLCO unc % pred: 72 %
DLCO unc: 26.36 ml/min/mmHg
FEF 25-75 Post: 7.44 L/sec
FEF 25-75 Pre: 6.35 L/sec
FEF2575-%Change-Post: 17 %
FEF2575-%Pred-Post: 272 %
FEF2575-%Pred-Pre: 232 %
FEV1-%Change-Post: 2 %
FEV1-%Pred-Post: 127 %
FEV1-%Pred-Pre: 124 %
FEV1-Post: 4.63 L
FEV1-Pre: 4.52 L
FEV1FVC-%Change-Post: 3 %
FEV1FVC-%Pred-Pre: 118 %
FEV6-%Change-Post: 0 %
FEV6-%Pred-Post: 111 %
FEV6-%Pred-Pre: 111 %
FEV6-Post: 5.18 L
FEV6-Pre: 5.22 L
FEV6FVC-%Change-Post: 0 %
FEV6FVC-%Pred-Post: 105 %
FEV6FVC-%Pred-Pre: 104 %
FVC-%Change-Post: 0 %
FVC-%Pred-Post: 105 %
FVC-%Pred-Pre: 106 %
FVC-Post: 5.19 L
FVC-Pre: 5.23 L
Post FEV1/FVC ratio: 89 %
Post FEV6/FVC ratio: 100 %
Pre FEV1/FVC ratio: 86 %
Pre FEV6/FVC Ratio: 100 %
RV % pred: 103 %
RV: 2.71 L
TLC % pred: 103 %
TLC: 7.9 L

## 2016-10-11 LAB — ABO/RH: ABO/RH(D): A POS

## 2016-10-11 LAB — BLOOD GAS, ARTERIAL
Acid-Base Excess: 1.5 mmol/L (ref 0.0–2.0)
Bicarbonate: 25 mmol/L (ref 20.0–28.0)
Drawn by: 449841
FIO2: 0.21
O2 Saturation: 91.8 %
Patient temperature: 98.6
pCO2 arterial: 35.7 mmHg (ref 32.0–48.0)
pH, Arterial: 7.459 — ABNORMAL HIGH (ref 7.350–7.450)
pO2, Arterial: 60.1 mmHg — ABNORMAL LOW (ref 83.0–108.0)

## 2016-10-11 LAB — SURGICAL PCR SCREEN
MRSA, PCR: NEGATIVE
Staphylococcus aureus: POSITIVE — AB

## 2016-10-11 LAB — PROTIME-INR
INR: 1.01
Prothrombin Time: 13.3 seconds (ref 11.4–15.2)

## 2016-10-11 LAB — APTT: aPTT: 29 seconds (ref 24–36)

## 2016-10-11 MED ORDER — CHLORHEXIDINE GLUCONATE 4 % EX LIQD: 30.0000 mL | CUTANEOUS | Status: DC

## 2016-10-11 MED ORDER — ALBUTEROL SULFATE (2.5 MG/3ML) 0.083% IN NEBU
2.5000 mg | INHALATION_SOLUTION | Freq: Once | RESPIRATORY_TRACT | Status: AC
  Administered 2016-10-11: 2.5 mg via RESPIRATORY_TRACT

## 2016-10-11 NOTE — Telephone Encounter (Signed)
Patient did not answer. Left message for patient to call back.   Also, patient was not scheduled for appt on 1/29, so I scheduled the patient for 10/17/16 at [place] with Richardson Dopp, [place].

## 2016-10-11 NOTE — Progress Notes (Signed)
Pre-op Cardiac Surgery  Carotid Findings:  Findings consistent with a 1-39 percent stenosis involving the right internal carotid artery and the left internal carotid artery.   Upper Extremity Right Left  Brachial Pressures Unable to obtain, Tri 131, Tri  Radial Waveforms Tri Tri  Ulnar Waveforms Tri Tri  Palmar Arch (Allen's Test) waveform obliterated with radial compression, is unchanged with ulnar compression. waveform diminished >50% with radial and ulnar compression.   Lower  Extremity Right Left  Dorsalis Pedis 165, Tri 149, Tri  Posterior Tibial 186, Tri 160, Tri  Ankle/Brachial Indices 1.4 1.2   Lita Mains- RDMS, RVT 12:18 PM  10/11/2016

## 2016-10-12 ENCOUNTER — Encounter (HOSPITAL_COMMUNITY): Payer: Self-pay | Admitting: Certified Registered Nurse Anesthetist

## 2016-10-12 ENCOUNTER — Telehealth: Payer: Self-pay | Admitting: *Deleted

## 2016-10-12 LAB — HEMOGLOBIN A1C
Hgb A1c MFr Bld: 5.6 % (ref 4.8–5.6)
Mean Plasma Glucose: 114 mg/dL

## 2016-10-12 NOTE — Telephone Encounter (Signed)
Pt is on the TCM list admitted for Coronary artery disease with exertional angina. Had a LHC: 10/09/16 done. Pt was D/C 1/25, and will f/u w/cardiology n ...Richard Ellis

## 2016-10-14 ENCOUNTER — Other Ambulatory Visit: Payer: Self-pay | Admitting: Family Medicine

## 2016-10-14 MED ORDER — MAGNESIUM SULFATE 50 % IJ SOLN
40.0000 meq | INTRAMUSCULAR | Status: DC
  Filled 2016-10-14: qty 10

## 2016-10-14 MED ORDER — SODIUM CHLORIDE 0.9 % IV SOLN
INTRAVENOUS | Status: DC
  Filled 2016-10-14: qty 30

## 2016-10-14 MED ORDER — PLASMA-LYTE 148 IV SOLN
INTRAVENOUS | Status: AC
  Administered 2016-10-15: 500 mL
  Filled 2016-10-14: qty 2.5

## 2016-10-14 MED ORDER — POTASSIUM CHLORIDE 2 MEQ/ML IV SOLN
80.0000 meq | INTRAVENOUS | Status: DC
  Filled 2016-10-14: qty 40

## 2016-10-14 MED ORDER — VANCOMYCIN HCL 10 G IV SOLR
1250.0000 mg | INTRAVENOUS | Status: AC
  Administered 2016-10-15: 1250 mg via INTRAVENOUS
  Filled 2016-10-14: qty 1250

## 2016-10-14 MED ORDER — TRANEXAMIC ACID (OHS) PUMP PRIME SOLUTION
2.0000 mg/kg | INTRAVENOUS | Status: DC
  Filled 2016-10-14: qty 1.66

## 2016-10-14 MED ORDER — DEXMEDETOMIDINE HCL IN NACL 400 MCG/100ML IV SOLN
0.1000 ug/kg/h | INTRAVENOUS | Status: AC
  Administered 2016-10-15: .3 ug/kg/h via INTRAVENOUS
  Filled 2016-10-14: qty 100

## 2016-10-14 MED ORDER — DEXTROSE 5 % IV SOLN
750.0000 mg | INTRAVENOUS | Status: DC
  Filled 2016-10-14: qty 750

## 2016-10-14 MED ORDER — PHENYLEPHRINE HCL 10 MG/ML IJ SOLN
30.0000 ug/min | INTRAMUSCULAR | Status: AC
  Administered 2016-10-15: 15 ug/min via INTRAVENOUS
  Filled 2016-10-14: qty 2

## 2016-10-14 MED ORDER — METOPROLOL TARTRATE 12.5 MG HALF TABLET
12.5000 mg | ORAL_TABLET | Freq: Once | ORAL | Status: DC
  Filled 2016-10-14: qty 1

## 2016-10-14 MED ORDER — EPINEPHRINE PF 1 MG/ML IJ SOLN
0.0000 ug/min | INTRAVENOUS | Status: DC
  Filled 2016-10-14: qty 4

## 2016-10-14 MED ORDER — CHLORHEXIDINE GLUCONATE 0.12 % MT SOLN
15.0000 mL | Freq: Once | OROMUCOSAL | Status: AC
  Administered 2016-10-15: 15 mL via OROMUCOSAL
  Filled 2016-10-14: qty 15

## 2016-10-14 MED ORDER — INSULIN REGULAR HUMAN 100 UNIT/ML IJ SOLN
INTRAMUSCULAR | Status: AC
  Administered 2016-10-15: .8 [IU]/h via INTRAVENOUS
  Filled 2016-10-14: qty 2.5

## 2016-10-14 MED ORDER — NITROGLYCERIN IN D5W 200-5 MCG/ML-% IV SOLN
2.0000 ug/min | INTRAVENOUS | Status: DC
  Filled 2016-10-14: qty 250

## 2016-10-14 MED ORDER — TRANEXAMIC ACID 1000 MG/10ML IV SOLN
1.5000 mg/kg/h | INTRAVENOUS | Status: AC
  Administered 2016-10-15: 1.5 mg/kg/h via INTRAVENOUS
  Filled 2016-10-14: qty 25

## 2016-10-14 MED ORDER — DEXTROSE 5 % IV SOLN
1.5000 g | INTRAVENOUS | Status: AC
  Administered 2016-10-15: .75 g via INTRAVENOUS
  Administered 2016-10-15: 1.5 g via INTRAVENOUS
  Filled 2016-10-14: qty 1.5

## 2016-10-14 MED ORDER — DOPAMINE-DEXTROSE 3.2-5 MG/ML-% IV SOLN
0.0000 ug/kg/min | INTRAVENOUS | Status: DC
  Filled 2016-10-14: qty 250

## 2016-10-14 MED ORDER — TRANEXAMIC ACID (OHS) BOLUS VIA INFUSION
15.0000 mg/kg | INTRAVENOUS | Status: AC
  Administered 2016-10-15: 1246.5 mg via INTRAVENOUS
  Filled 2016-10-14: qty 1247

## 2016-10-14 NOTE — Anesthesia Preprocedure Evaluation (Addendum)
Anesthesia Evaluation  Patient identified by MRN, date of birth, ID band Patient awake    Reviewed: Allergy & Precautions, NPO status , Patient's Chart, lab work & pertinent test results  Airway Mallampati: II  TM Distance: >3 FB Neck ROM: Full    Dental  (+) Dental Advisory Given   Pulmonary asthma ,    breath sounds clear to auscultation       Cardiovascular + angina + CAD   Rhythm:Regular Rate:Normal  Compared to a prior study in 2014, the LVEF is stable. There is   now moderate LVH and moderate LAE. A small PFO was previously   noted by bubble study and is seen today by color doppler.   Neuro/Psych CVA    GI/Hepatic negative GI ROS, Neg liver ROS,   Endo/Other  Hypothyroidism   Renal/GU negative Renal ROS     Musculoskeletal  (+) Arthritis ,   Abdominal   Peds  Hematology  (+) anemia ,   Anesthesia Other Findings   Reproductive/Obstetrics                            Lab Results  Component Value Date   WBC 5.2 10/11/2016   HGB 12.9 (L) 10/11/2016   HCT 38.9 (L) 10/11/2016   MCV 94.2 10/11/2016   PLT 162 10/11/2016   Lab Results  Component Value Date   CREATININE 0.99 10/11/2016   BUN 18 10/11/2016   NA 140 10/11/2016   K 3.8 10/11/2016   CL 107 10/11/2016   CO2 25 10/11/2016    Anesthesia Physical Anesthesia Plan  ASA: IV  Anesthesia Plan: General   Post-op Pain Management:    Induction: Intravenous  Airway Management Planned: Oral ETT  Additional Equipment: Arterial line, TEE, CVP, PA Cath and Ultrasound Guidance Line Placement  Intra-op Plan:   Post-operative Plan: Post-operative intubation/ventilation  Informed Consent: I have reviewed the patients History and Physical, chart, labs and discussed the procedure including the risks, benefits and alternatives for the proposed anesthesia with the patient or authorized representative who has indicated his/her  understanding and acceptance.   Dental advisory given  Plan Discussed with: CRNA  Anesthesia Plan Comments:        Anesthesia Quick Evaluation

## 2016-10-15 ENCOUNTER — Inpatient Hospital Stay (HOSPITAL_COMMUNITY): Payer: Medicare Other | Admitting: Certified Registered Nurse Anesthetist

## 2016-10-15 ENCOUNTER — Encounter (HOSPITAL_COMMUNITY)
Admission: RE | Disposition: A | Discharge status: Home / Self Care | Payer: Self-pay | Source: Ambulatory Visit | Attending: Cardiothoracic Surgery

## 2016-10-15 ENCOUNTER — Inpatient Hospital Stay (HOSPITAL_COMMUNITY): Payer: Medicare Other

## 2016-10-15 ENCOUNTER — Inpatient Hospital Stay (HOSPITAL_COMMUNITY)
Admission: RE | Admit: 2016-10-15 | Discharge: 2016-10-20 | DRG: 229 | Disposition: A | Discharge status: Home / Self Care | Payer: Medicare Other | Source: Ambulatory Visit | Attending: Cardiothoracic Surgery | Admitting: Cardiothoracic Surgery

## 2016-10-15 ENCOUNTER — Ambulatory Visit: Payer: Federal, State, Local not specified - PPO | Admitting: Physician Assistant

## 2016-10-15 ENCOUNTER — Encounter (HOSPITAL_COMMUNITY): Payer: Self-pay | Admitting: *Deleted

## 2016-10-15 DIAGNOSIS — I25110 Atherosclerotic heart disease of native coronary artery with unstable angina pectoris: Secondary | ICD-10-CM | POA: Diagnosis not present

## 2016-10-15 DIAGNOSIS — D696 Thrombocytopenia, unspecified: Secondary | ICD-10-CM | POA: Diagnosis not present

## 2016-10-15 DIAGNOSIS — G8929 Other chronic pain: Secondary | ICD-10-CM | POA: Diagnosis present

## 2016-10-15 DIAGNOSIS — D689 Coagulation defect, unspecified: Secondary | ICD-10-CM | POA: Diagnosis not present

## 2016-10-15 DIAGNOSIS — Z8673 Personal history of transient ischemic attack (TIA), and cerebral infarction without residual deficits: Secondary | ICD-10-CM

## 2016-10-15 DIAGNOSIS — J42 Unspecified chronic bronchitis: Secondary | ICD-10-CM | POA: Diagnosis not present

## 2016-10-15 DIAGNOSIS — Z8249 Family history of ischemic heart disease and other diseases of the circulatory system: Secondary | ICD-10-CM

## 2016-10-15 DIAGNOSIS — E8770 Fluid overload, unspecified: Secondary | ICD-10-CM | POA: Diagnosis not present

## 2016-10-15 DIAGNOSIS — I2510 Atherosclerotic heart disease of native coronary artery without angina pectoris: Secondary | ICD-10-CM | POA: Diagnosis present

## 2016-10-15 DIAGNOSIS — I340 Nonrheumatic mitral (valve) insufficiency: Secondary | ICD-10-CM | POA: Diagnosis not present

## 2016-10-15 DIAGNOSIS — K222 Esophageal obstruction: Secondary | ICD-10-CM | POA: Diagnosis not present

## 2016-10-15 DIAGNOSIS — Z7902 Long term (current) use of antithrombotics/antiplatelets: Secondary | ICD-10-CM | POA: Diagnosis not present

## 2016-10-15 DIAGNOSIS — Z888 Allergy status to other drugs, medicaments and biological substances status: Secondary | ICD-10-CM

## 2016-10-15 DIAGNOSIS — D62 Acute posthemorrhagic anemia: Secondary | ICD-10-CM | POA: Diagnosis not present

## 2016-10-15 DIAGNOSIS — E785 Hyperlipidemia, unspecified: Secondary | ICD-10-CM | POA: Diagnosis not present

## 2016-10-15 DIAGNOSIS — E039 Hypothyroidism, unspecified: Secondary | ICD-10-CM | POA: Diagnosis not present

## 2016-10-15 DIAGNOSIS — M545 Low back pain: Secondary | ICD-10-CM | POA: Diagnosis not present

## 2016-10-15 DIAGNOSIS — K5900 Constipation, unspecified: Secondary | ICD-10-CM | POA: Diagnosis not present

## 2016-10-15 DIAGNOSIS — R001 Bradycardia, unspecified: Secondary | ICD-10-CM | POA: Diagnosis present

## 2016-10-15 DIAGNOSIS — J939 Pneumothorax, unspecified: Secondary | ICD-10-CM

## 2016-10-15 DIAGNOSIS — Q211 Atrial septal defect: Secondary | ICD-10-CM | POA: Diagnosis not present

## 2016-10-15 DIAGNOSIS — Z951 Presence of aortocoronary bypass graft: Secondary | ICD-10-CM

## 2016-10-15 HISTORY — PX: CORONARY ARTERY BYPASS GRAFT: SHX141

## 2016-10-15 HISTORY — PX: TEE WITHOUT CARDIOVERSION: SHX5443

## 2016-10-15 HISTORY — PX: REPAIR OF PATENT FORAMEN OVALE: SHX6064

## 2016-10-15 HISTORY — DX: Atherosclerotic heart disease of native coronary artery without angina pectoris: I25.10

## 2016-10-15 LAB — POCT I-STAT, CHEM 8
BUN: 21 mg/dL — ABNORMAL HIGH (ref 6–20)
BUN: 18 mg/dL (ref 6–20)
BUN: 21 mg/dL — ABNORMAL HIGH (ref 6–20)
BUN: 18 mg/dL (ref 6–20)
BUN: 18 mg/dL (ref 6–20)
BUN: 17 mg/dL (ref 6–20)
Calcium, Ion: 1.34 mmol/L (ref 1.15–1.40)
Calcium, Ion: 1.07 mmol/L — ABNORMAL LOW (ref 1.15–1.40)
Calcium, Ion: 1.28 mmol/L (ref 1.15–1.40)
Calcium, Ion: 1.08 mmol/L — ABNORMAL LOW (ref 1.15–1.40)
Calcium, Ion: 1.13 mmol/L — ABNORMAL LOW (ref 1.15–1.40)
Calcium, Ion: 1.16 mmol/L (ref 1.15–1.40)
Chloride: 102 mmol/L (ref 101–111)
Chloride: 102 mmol/L (ref 101–111)
Chloride: 104 mmol/L (ref 101–111)
Chloride: 101 mmol/L (ref 101–111)
Chloride: 103 mmol/L (ref 101–111)
Chloride: 105 mmol/L (ref 101–111)
Creatinine, Ser: 0.8 mg/dL (ref 0.61–1.24)
Creatinine, Ser: 0.5 mg/dL — ABNORMAL LOW (ref 0.61–1.24)
Creatinine, Ser: 0.6 mg/dL — ABNORMAL LOW (ref 0.61–1.24)
Creatinine, Ser: 0.7 mg/dL (ref 0.61–1.24)
Creatinine, Ser: 0.7 mg/dL (ref 0.61–1.24)
Creatinine, Ser: 0.8 mg/dL (ref 0.61–1.24)
Glucose, Bld: 99 mg/dL (ref 65–99)
Glucose, Bld: 103 mg/dL — ABNORMAL HIGH (ref 65–99)
Glucose, Bld: 93 mg/dL (ref 65–99)
Glucose, Bld: 137 mg/dL — ABNORMAL HIGH (ref 65–99)
Glucose, Bld: 141 mg/dL — ABNORMAL HIGH (ref 65–99)
Glucose, Bld: 135 mg/dL — ABNORMAL HIGH (ref 65–99)
HCT: 30 % — ABNORMAL LOW (ref 39.0–52.0)
HCT: 26 % — ABNORMAL LOW (ref 39.0–52.0)
HCT: 28 % — ABNORMAL LOW (ref 39.0–52.0)
HCT: 23 % — ABNORMAL LOW (ref 39.0–52.0)
HCT: 26 % — ABNORMAL LOW (ref 39.0–52.0)
HCT: 24 % — ABNORMAL LOW (ref 39.0–52.0)
Hemoglobin: 10.2 g/dL — ABNORMAL LOW (ref 13.0–17.0)
Hemoglobin: 8.8 g/dL — ABNORMAL LOW (ref 13.0–17.0)
Hemoglobin: 9.5 g/dL — ABNORMAL LOW (ref 13.0–17.0)
Hemoglobin: 7.8 g/dL — ABNORMAL LOW (ref 13.0–17.0)
Hemoglobin: 8.8 g/dL — ABNORMAL LOW (ref 13.0–17.0)
Hemoglobin: 8.2 g/dL — ABNORMAL LOW (ref 13.0–17.0)
Potassium: 4 mmol/L (ref 3.5–5.1)
Potassium: 4.4 mmol/L (ref 3.5–5.1)
Potassium: 4.4 mmol/L (ref 3.5–5.1)
Potassium: 4.3 mmol/L (ref 3.5–5.1)
Potassium: 3.9 mmol/L (ref 3.5–5.1)
Potassium: 4.5 mmol/L (ref 3.5–5.1)
Sodium: 138 mmol/L (ref 135–145)
Sodium: 139 mmol/L (ref 135–145)
Sodium: 139 mmol/L (ref 135–145)
Sodium: 139 mmol/L (ref 135–145)
Sodium: 138 mmol/L (ref 135–145)
Sodium: 140 mmol/L (ref 135–145)
TCO2: 30 mmol/L (ref 0–100)
TCO2: 28 mmol/L (ref 0–100)
TCO2: 29 mmol/L (ref 0–100)
TCO2: 27 mmol/L (ref 0–100)
TCO2: 25 mmol/L (ref 0–100)
TCO2: 27 mmol/L (ref 0–100)

## 2016-10-15 LAB — DIC (DISSEMINATED INTRAVASCULAR COAGULATION) PANEL (NOT AT ARMC)
Prothrombin Time: 16.6 seconds — ABNORMAL HIGH (ref 11.4–15.2)
Smear Review: NONE SEEN
aPTT: 29 seconds (ref 24–36)

## 2016-10-15 LAB — POCT I-STAT 4, (NA,K, GLUC, HGB,HCT)
Glucose, Bld: 107 mg/dL — ABNORMAL HIGH (ref 65–99)
HCT: 26 % — ABNORMAL LOW (ref 39.0–52.0)
Hemoglobin: 8.8 g/dL — ABNORMAL LOW (ref 13.0–17.0)
Potassium: 3.8 mmol/L (ref 3.5–5.1)
Sodium: 140 mmol/L (ref 135–145)

## 2016-10-15 LAB — CBC
HCT: 27 % — ABNORMAL LOW (ref 39.0–52.0)
HCT: 25 % — ABNORMAL LOW (ref 39.0–52.0)
Hemoglobin: 9.3 g/dL — ABNORMAL LOW (ref 13.0–17.0)
Hemoglobin: 8.7 g/dL — ABNORMAL LOW (ref 13.0–17.0)
MCH: 31.5 pg (ref 26.0–34.0)
MCH: 31.9 pg (ref 26.0–34.0)
MCHC: 34.4 g/dL (ref 30.0–36.0)
MCHC: 34.8 g/dL (ref 30.0–36.0)
MCV: 91.5 fL (ref 78.0–100.0)
MCV: 91.6 fL (ref 78.0–100.0)
Platelets: 122 10*3/uL — ABNORMAL LOW (ref 150–400)
Platelets: 140 10*3/uL — ABNORMAL LOW (ref 150–400)
RBC: 2.95 MIL/uL — ABNORMAL LOW (ref 4.22–5.81)
RBC: 2.73 MIL/uL — ABNORMAL LOW (ref 4.22–5.81)
RDW: 12 % (ref 11.5–15.5)
RDW: 12.4 % (ref 11.5–15.5)
WBC: 11.7 10*3/uL — ABNORMAL HIGH (ref 4.0–10.5)
WBC: 8.8 10*3/uL (ref 4.0–10.5)

## 2016-10-15 LAB — POCT I-STAT 3, ART BLOOD GAS (G3+)
Acid-Base Excess: 2 mmol/L (ref 0.0–2.0)
Acid-Base Excess: 1 mmol/L (ref 0.0–2.0)
Acid-base deficit: 1 mmol/L (ref 0.0–2.0)
Acid-base deficit: 1 mmol/L (ref 0.0–2.0)
Acid-base deficit: 3 mmol/L — ABNORMAL HIGH (ref 0.0–2.0)
Bicarbonate: 23.4 mmol/L (ref 20.0–28.0)
Bicarbonate: 24 mmol/L (ref 20.0–28.0)
Bicarbonate: 24.8 mmol/L (ref 20.0–28.0)
Bicarbonate: 26.2 mmol/L (ref 20.0–28.0)
Bicarbonate: 27 mmol/L (ref 20.0–28.0)
O2 Saturation: 100 %
O2 Saturation: 100 %
O2 Saturation: 100 %
O2 Saturation: 94 %
O2 Saturation: 99 %
Patient temperature: 35.9
Patient temperature: 37.1
Patient temperature: 37.2
TCO2: 25 mmol/L (ref 0–100)
TCO2: 25 mmol/L (ref 0–100)
TCO2: 26 mmol/L (ref 0–100)
TCO2: 27 mmol/L (ref 0–100)
TCO2: 28 mmol/L (ref 0–100)
pCO2 arterial: 39.5 mmHg (ref 32.0–48.0)
pCO2 arterial: 43.3 mmHg (ref 32.0–48.0)
pCO2 arterial: 43.4 mmHg (ref 32.0–48.0)
pCO2 arterial: 45.6 mmHg (ref 32.0–48.0)
pCO2 arterial: 46.3 mmHg (ref 32.0–48.0)
pH, Arterial: 7.318 — ABNORMAL LOW (ref 7.350–7.450)
pH, Arterial: 7.352 (ref 7.350–7.450)
pH, Arterial: 7.366 (ref 7.350–7.450)
pH, Arterial: 7.374 (ref 7.350–7.450)
pH, Arterial: 7.425 (ref 7.350–7.450)
pO2, Arterial: 137 mmHg — ABNORMAL HIGH (ref 83.0–108.0)
pO2, Arterial: 177 mmHg — ABNORMAL HIGH (ref 83.0–108.0)
pO2, Arterial: 218 mmHg — ABNORMAL HIGH (ref 83.0–108.0)
pO2, Arterial: 422 mmHg — ABNORMAL HIGH (ref 83.0–108.0)
pO2, Arterial: 75 mmHg — ABNORMAL LOW (ref 83.0–108.0)

## 2016-10-15 LAB — DIC (DISSEMINATED INTRAVASCULAR COAGULATION)PANEL
D-Dimer, Quant: 0.94 ug/mL-FEU — ABNORMAL HIGH (ref 0.00–0.50)
D-Dimer, Quant: 0.77 ug/mL-FEU — ABNORMAL HIGH (ref 0.00–0.50)
Fibrinogen: 208 mg/dL — ABNORMAL LOW (ref 210–475)
Fibrinogen: 221 mg/dL (ref 210–475)
INR: 1.37
INR: 1.33
Platelets: 117 10*3/uL — ABNORMAL LOW (ref 150–400)
Platelets: 135 10*3/uL — ABNORMAL LOW (ref 150–400)
Prothrombin Time: 17 seconds — ABNORMAL HIGH (ref 11.4–15.2)
Smear Review: NONE SEEN
aPTT: 30 seconds (ref 24–36)

## 2016-10-15 LAB — HEMOGLOBIN AND HEMATOCRIT, BLOOD
HCT: 24.4 % — ABNORMAL LOW (ref 39.0–52.0)
Hemoglobin: 8.4 g/dL — ABNORMAL LOW (ref 13.0–17.0)

## 2016-10-15 LAB — CREATININE, SERUM
Creatinine, Ser: 0.96 mg/dL (ref 0.61–1.24)
GFR calc Af Amer: >60 mL/min (ref >60)
GFR calc non Af Amer: >60 mL/min (ref >60)

## 2016-10-15 LAB — GLUCOSE, CAPILLARY
Glucose-Capillary: 84 mg/dL (ref 65–99)
Glucose-Capillary: 104 mg/dL — ABNORMAL HIGH (ref 65–99)
Glucose-Capillary: 115 mg/dL — ABNORMAL HIGH (ref 65–99)

## 2016-10-15 LAB — PREPARE RBC (CROSSMATCH)

## 2016-10-15 LAB — PLATELET COUNT: Platelets: 114 10*3/uL — ABNORMAL LOW (ref 150–400)

## 2016-10-15 LAB — MAGNESIUM: Magnesium: 2.6 mg/dL — ABNORMAL HIGH (ref 1.7–2.4)

## 2016-10-15 SURGERY — CORONARY ARTERY BYPASS GRAFTING (CABG)
Anesthesia: General | Site: Chest

## 2016-10-15 MED ORDER — METOPROLOL TARTRATE 12.5 MG HALF TABLET
12.5000 mg | ORAL_TABLET | Freq: Two times a day (BID) | ORAL | Status: DC
  Administered 2016-10-16 – 2016-10-17 (×3): 12.5 mg via ORAL
  Filled 2016-10-15 (×3): qty 1

## 2016-10-15 MED ORDER — LACTATED RINGERS IV SOLN
INTRAVENOUS | Status: DC | PRN
  Administered 2016-10-15: 07:00:00 via INTRAVENOUS

## 2016-10-15 MED ORDER — LEVOTHYROXINE SODIUM 25 MCG PO TABS
25.0000 ug | ORAL_TABLET | Freq: Every day | ORAL | Status: DC
  Administered 2016-10-16 – 2016-10-20 (×5): 25 ug via ORAL
  Filled 2016-10-15 (×5): qty 1

## 2016-10-15 MED ORDER — MIDAZOLAM HCL 5 MG/5ML IJ SOLN
INTRAMUSCULAR | Status: DC | PRN
  Administered 2016-10-15: 4 mg via INTRAVENOUS
  Administered 2016-10-15: 1 mg via INTRAVENOUS
  Administered 2016-10-15: 2 mg via INTRAVENOUS
  Administered 2016-10-15: 1 mg via INTRAVENOUS

## 2016-10-15 MED ORDER — ACETAMINOPHEN 500 MG PO TABS
1000.0000 mg | ORAL_TABLET | Freq: Four times a day (QID) | ORAL | Status: DC
  Administered 2016-10-16 – 2016-10-19 (×11): 1000 mg via ORAL
  Filled 2016-10-15 (×13): qty 2

## 2016-10-15 MED ORDER — LACTATED RINGERS IV SOLN
INTRAVENOUS | Status: DC | PRN
  Administered 2016-10-15 (×2): via INTRAVENOUS

## 2016-10-15 MED ORDER — DEXMEDETOMIDINE HCL IN NACL 200 MCG/50ML IV SOLN
0.0000 ug/kg/h | INTRAVENOUS | Status: DC
  Administered 2016-10-15: 0.7 ug/kg/h via INTRAVENOUS
  Administered 2016-10-15: 0.5 ug/kg/h via INTRAVENOUS
  Filled 2016-10-15 (×2): qty 50

## 2016-10-15 MED ORDER — FENTANYL CITRATE (PF) 250 MCG/5ML IJ SOLN
INTRAMUSCULAR | Status: AC
  Filled 2016-10-15: qty 10

## 2016-10-15 MED ORDER — INSULIN REGULAR BOLUS VIA INFUSION
0.0000 [IU] | Freq: Three times a day (TID) | INTRAVENOUS | Status: DC
  Filled 2016-10-15: qty 10

## 2016-10-15 MED ORDER — SODIUM CHLORIDE 0.9 % IV SOLN
30.0000 ug | INTRAVENOUS | Status: AC
  Administered 2016-10-15: 30 ug via INTRAVENOUS
  Filled 2016-10-15: qty 7.5

## 2016-10-15 MED ORDER — OXYCODONE HCL 5 MG PO TABS
5.0000 mg | ORAL_TABLET | ORAL | Status: DC | PRN
  Administered 2016-10-16: 10 mg via ORAL
  Administered 2016-10-16 (×2): 5 mg via ORAL
  Administered 2016-10-17 (×2): 10 mg via ORAL
  Administered 2016-10-17: 5 mg via ORAL
  Administered 2016-10-17 – 2016-10-18 (×2): 10 mg via ORAL
  Administered 2016-10-18: 5 mg via ORAL
  Administered 2016-10-18: 10 mg via ORAL
  Filled 2016-10-15: qty 1
  Filled 2016-10-15: qty 2
  Filled 2016-10-15 (×3): qty 1
  Filled 2016-10-15 (×2): qty 2
  Filled 2016-10-15: qty 1
  Filled 2016-10-15 (×3): qty 2

## 2016-10-15 MED ORDER — HEMOSTATIC AGENTS (NO CHARGE) OPTIME
TOPICAL | Status: DC | PRN
  Administered 2016-10-15 (×3): 1 via TOPICAL

## 2016-10-15 MED ORDER — MAGNESIUM SULFATE 4 GM/100ML IV SOLN
4.0000 g | Freq: Once | INTRAVENOUS | Status: AC
  Administered 2016-10-15: 4 g via INTRAVENOUS
  Filled 2016-10-15: qty 100

## 2016-10-15 MED ORDER — INSULIN ASPART 100 UNIT/ML ~~LOC~~ SOLN
0.0000 [IU] | SUBCUTANEOUS | Status: DC
  Administered 2016-10-15: 2 [IU] via SUBCUTANEOUS
  Administered 2016-10-16: 4 [IU] via SUBCUTANEOUS
  Administered 2016-10-16: 2 [IU] via SUBCUTANEOUS

## 2016-10-15 MED ORDER — ORAL CARE MOUTH RINSE
15.0000 mL | Freq: Four times a day (QID) | OROMUCOSAL | Status: DC
  Administered 2016-10-16: 15 mL via OROMUCOSAL

## 2016-10-15 MED ORDER — FAMOTIDINE IN NACL 20-0.9 MG/50ML-% IV SOLN
20.0000 mg | Freq: Two times a day (BID) | INTRAVENOUS | Status: AC
  Administered 2016-10-15 (×2): 20 mg via INTRAVENOUS
  Filled 2016-10-15: qty 50

## 2016-10-15 MED ORDER — NITROGLYCERIN IN D5W 200-5 MCG/ML-% IV SOLN: 0.0000 ug/min | INTRAVENOUS | Status: DC

## 2016-10-15 MED ORDER — ACETAMINOPHEN 160 MG/5ML PO SOLN: 650.0000 mg | Freq: Once | ORAL | Status: AC

## 2016-10-15 MED ORDER — SODIUM CHLORIDE 0.9 % IV SOLN: 250.0000 mL | INTRAVENOUS | Status: DC

## 2016-10-15 MED ORDER — SODIUM CHLORIDE 0.9% FLUSH
3.0000 mL | Freq: Two times a day (BID) | INTRAVENOUS | Status: DC
  Administered 2016-10-16 – 2016-10-19 (×5): 3 mL via INTRAVENOUS

## 2016-10-15 MED ORDER — ROCURONIUM BROMIDE 10 MG/ML (PF) SYRINGE
PREFILLED_SYRINGE | INTRAVENOUS | Status: DC | PRN
  Administered 2016-10-15: 50 mg via INTRAVENOUS
  Administered 2016-10-15: 30 mg via INTRAVENOUS
  Administered 2016-10-15 (×3): 50 mg via INTRAVENOUS

## 2016-10-15 MED ORDER — SODIUM CHLORIDE 0.9% FLUSH: 3.0000 mL | INTRAVENOUS | Status: DC | PRN

## 2016-10-15 MED ORDER — FENTANYL CITRATE (PF) 250 MCG/5ML IJ SOLN
INTRAMUSCULAR | Status: AC
  Filled 2016-10-15: qty 25

## 2016-10-15 MED ORDER — SODIUM CHLORIDE 0.9 % IV SOLN
INTRAVENOUS | Status: DC
  Filled 2016-10-15: qty 2.5

## 2016-10-15 MED ORDER — ALBUMIN HUMAN 5 % IV SOLN
INTRAVENOUS | Status: DC | PRN
  Administered 2016-10-15: 13:00:00 via INTRAVENOUS

## 2016-10-15 MED ORDER — BISACODYL 5 MG PO TBEC
10.0000 mg | DELAYED_RELEASE_TABLET | Freq: Every day | ORAL | Status: DC
  Administered 2016-10-16 – 2016-10-20 (×5): 10 mg via ORAL
  Filled 2016-10-15 (×5): qty 2

## 2016-10-15 MED ORDER — CHLORHEXIDINE GLUCONATE 0.12 % MT SOLN
15.0000 mL | OROMUCOSAL | Status: AC
  Administered 2016-10-15: 15 mL via OROMUCOSAL

## 2016-10-15 MED ORDER — POTASSIUM CHLORIDE 2 MEQ/ML IV SOLN
30.0000 meq | Freq: Once | INTRAVENOUS | Status: AC
  Administered 2016-10-15: 30 meq via INTRAVENOUS
  Filled 2016-10-15: qty 15

## 2016-10-15 MED ORDER — SODIUM CHLORIDE 0.9 % IJ SOLN
OROMUCOSAL | Status: DC | PRN
  Administered 2016-10-15 (×3): 4 mL via TOPICAL

## 2016-10-15 MED ORDER — HEPARIN SODIUM (PORCINE) 1000 UNIT/ML IJ SOLN
INTRAMUSCULAR | Status: DC | PRN
  Administered 2016-10-15: 32000 [IU] via INTRAVENOUS
  Administered 2016-10-15: 3000 [IU] via INTRAVENOUS

## 2016-10-15 MED ORDER — SODIUM CHLORIDE 0.9 % IV SOLN
0.0000 ug/min | INTRAVENOUS | Status: DC
  Filled 2016-10-15: qty 2

## 2016-10-15 MED ORDER — PROPOFOL 10 MG/ML IV BOLUS
INTRAVENOUS | Status: DC | PRN
  Administered 2016-10-15: 50 mg via INTRAVENOUS

## 2016-10-15 MED ORDER — METOPROLOL TARTRATE 25 MG/10 ML ORAL SUSPENSION: 12.5000 mg | Freq: Two times a day (BID) | ORAL | Status: DC

## 2016-10-15 MED ORDER — 0.9 % SODIUM CHLORIDE (POUR BTL) OPTIME
TOPICAL | Status: DC | PRN
  Administered 2016-10-15: 6000 mL

## 2016-10-15 MED ORDER — MIDAZOLAM HCL 2 MG/2ML IJ SOLN
2.0000 mg | INTRAMUSCULAR | Status: DC | PRN
  Administered 2016-10-15: 2 mg via INTRAVENOUS
  Filled 2016-10-15: qty 2

## 2016-10-15 MED ORDER — SODIUM CHLORIDE 0.9 % IV SOLN: INTRAVENOUS | Status: DC

## 2016-10-15 MED ORDER — EPHEDRINE SULFATE 50 MG/ML IJ SOLN
INTRAMUSCULAR | Status: DC | PRN
  Administered 2016-10-15 (×2): 5 mg via INTRAVENOUS

## 2016-10-15 MED ORDER — TRAMADOL HCL 50 MG PO TABS
50.0000 mg | ORAL_TABLET | ORAL | Status: DC | PRN
  Administered 2016-10-16 – 2016-10-18 (×2): 50 mg via ORAL
  Filled 2016-10-15 (×2): qty 1

## 2016-10-15 MED ORDER — SODIUM CHLORIDE 0.9 % IV SOLN: Freq: Once | INTRAVENOUS | Status: DC

## 2016-10-15 MED ORDER — ASPIRIN 81 MG PO CHEW: 324.0000 mg | CHEWABLE_TABLET | Freq: Every day | ORAL | Status: DC

## 2016-10-15 MED ORDER — LACTATED RINGERS IV SOLN: INTRAVENOUS | Status: DC

## 2016-10-15 MED ORDER — ONDANSETRON HCL 4 MG/2ML IJ SOLN
4.0000 mg | Freq: Four times a day (QID) | INTRAMUSCULAR | Status: DC | PRN
  Administered 2016-10-16 (×2): 4 mg via INTRAVENOUS
  Filled 2016-10-15 (×2): qty 2

## 2016-10-15 MED ORDER — MORPHINE SULFATE (PF) 2 MG/ML IV SOLN
2.0000 mg | INTRAVENOUS | Status: DC | PRN
  Administered 2016-10-16: 4 mg via INTRAVENOUS
  Administered 2016-10-16 (×3): 2 mg via INTRAVENOUS
  Administered 2016-10-16: 4 mg via INTRAVENOUS
  Administered 2016-10-16: 2 mg via INTRAVENOUS
  Administered 2016-10-16: 4 mg via INTRAVENOUS
  Administered 2016-10-16: 2 mg via INTRAVENOUS
  Filled 2016-10-15 (×3): qty 1
  Filled 2016-10-15 (×2): qty 2
  Filled 2016-10-15 (×4): qty 1
  Filled 2016-10-15: qty 2

## 2016-10-15 MED ORDER — METOCLOPRAMIDE HCL 5 MG/ML IJ SOLN
10.0000 mg | Freq: Four times a day (QID) | INTRAMUSCULAR | Status: AC
  Administered 2016-10-15 – 2016-10-18 (×11): 10 mg via INTRAVENOUS
  Filled 2016-10-15 (×11): qty 2

## 2016-10-15 MED ORDER — ASPIRIN EC 325 MG PO TBEC: 325.0000 mg | DELAYED_RELEASE_TABLET | Freq: Every day | ORAL | Status: DC

## 2016-10-15 MED ORDER — METOPROLOL TARTRATE 5 MG/5ML IV SOLN: 2.5000 mg | INTRAVENOUS | Status: DC | PRN

## 2016-10-15 MED ORDER — SODIUM CHLORIDE 0.45 % IV SOLN: INTRAVENOUS | Status: DC | PRN

## 2016-10-15 MED ORDER — ACETAMINOPHEN 160 MG/5ML PO SOLN: 1000.0000 mg | Freq: Four times a day (QID) | ORAL | Status: DC

## 2016-10-15 MED ORDER — DOCUSATE SODIUM 100 MG PO CAPS
200.0000 mg | ORAL_CAPSULE | Freq: Every day | ORAL | Status: DC
  Administered 2016-10-16 – 2016-10-20 (×5): 200 mg via ORAL
  Filled 2016-10-15 (×6): qty 2

## 2016-10-15 MED ORDER — PROPOFOL 10 MG/ML IV BOLUS
INTRAVENOUS | Status: AC
  Filled 2016-10-15: qty 20

## 2016-10-15 MED ORDER — BISACODYL 10 MG RE SUPP: 10.0000 mg | Freq: Every day | RECTAL | Status: DC

## 2016-10-15 MED ORDER — LACTATED RINGERS IV SOLN: 500.0000 mL | Freq: Once | INTRAVENOUS | Status: DC | PRN

## 2016-10-15 MED ORDER — ACETAMINOPHEN 650 MG RE SUPP
650.0000 mg | Freq: Once | RECTAL | Status: AC
  Administered 2016-10-15: 650 mg via RECTAL

## 2016-10-15 MED ORDER — DEXTROSE 5 % IV SOLN
1.5000 g | Freq: Two times a day (BID) | INTRAVENOUS | Status: AC
  Administered 2016-10-15 – 2016-10-17 (×4): 1.5 g via INTRAVENOUS
  Filled 2016-10-15 (×4): qty 1.5

## 2016-10-15 MED ORDER — PROTAMINE SULFATE 10 MG/ML IV SOLN
INTRAVENOUS | Status: DC | PRN
  Administered 2016-10-15: 10 mg via INTRAVENOUS
  Administered 2016-10-15: 290 mg via INTRAVENOUS

## 2016-10-15 MED ORDER — CHLORHEXIDINE GLUCONATE 0.12% ORAL RINSE (MEDLINE KIT)
15.0000 mL | Freq: Two times a day (BID) | OROMUCOSAL | Status: DC
  Administered 2016-10-15: 15 mL via OROMUCOSAL

## 2016-10-15 MED ORDER — MIDAZOLAM HCL 10 MG/2ML IJ SOLN
INTRAMUSCULAR | Status: AC
  Filled 2016-10-15: qty 2

## 2016-10-15 MED ORDER — ALBUMIN HUMAN 5 % IV SOLN
250.0000 mL | INTRAVENOUS | Status: AC | PRN
  Administered 2016-10-15 (×2): 250 mL via INTRAVENOUS

## 2016-10-15 MED ORDER — FENTANYL CITRATE (PF) 250 MCG/5ML IJ SOLN
INTRAMUSCULAR | Status: DC | PRN
  Administered 2016-10-15 (×2): 100 ug via INTRAVENOUS
  Administered 2016-10-15: 25 ug via INTRAVENOUS
  Administered 2016-10-15: 100 ug via INTRAVENOUS
  Administered 2016-10-15: 150 ug via INTRAVENOUS
  Administered 2016-10-15 (×5): 100 ug via INTRAVENOUS
  Administered 2016-10-15: 150 ug via INTRAVENOUS
  Administered 2016-10-15: 200 ug via INTRAVENOUS
  Administered 2016-10-15: 25 ug via INTRAVENOUS
  Administered 2016-10-15: 100 ug via INTRAVENOUS
  Administered 2016-10-15: 150 ug via INTRAVENOUS

## 2016-10-15 MED ORDER — PANTOPRAZOLE SODIUM 40 MG PO TBEC
40.0000 mg | DELAYED_RELEASE_TABLET | Freq: Every day | ORAL | Status: DC
  Administered 2016-10-17 – 2016-10-20 (×4): 40 mg via ORAL
  Filled 2016-10-15 (×4): qty 1

## 2016-10-15 MED ORDER — GABAPENTIN 100 MG PO CAPS
100.0000 mg | ORAL_CAPSULE | Freq: Two times a day (BID) | ORAL | Status: DC
  Administered 2016-10-16 – 2016-10-20 (×9): 100 mg via ORAL
  Filled 2016-10-15 (×9): qty 1

## 2016-10-15 MED ORDER — VANCOMYCIN HCL IN DEXTROSE 1-5 GM/200ML-% IV SOLN
1000.0000 mg | Freq: Once | INTRAVENOUS | Status: DC
  Filled 2016-10-15: qty 200

## 2016-10-15 MED ORDER — VANCOMYCIN HCL IN DEXTROSE 1-5 GM/200ML-% IV SOLN
1000.0000 mg | Freq: Two times a day (BID) | INTRAVENOUS | Status: AC
Start: 2016-10-15 — End: 2016-10-16
  Administered 2016-10-15 – 2016-10-16 (×2): 1000 mg via INTRAVENOUS
  Filled 2016-10-15 (×2): qty 200

## 2016-10-15 MED ORDER — MORPHINE SULFATE (PF) 2 MG/ML IV SOLN
1.0000 mg | INTRAVENOUS | Status: AC | PRN
  Administered 2016-10-15 (×2): 2 mg via INTRAVENOUS
  Filled 2016-10-15: qty 1

## 2016-10-15 MED FILL — Heparin Sodium (Porcine) Inj 1000 Unit/ML: INTRAMUSCULAR | Qty: 20 | Status: AC

## 2016-10-15 MED FILL — Sodium Chloride IV Soln 0.9%: INTRAVENOUS | Qty: 2000 | Status: AC

## 2016-10-15 MED FILL — Mannitol IV Soln 20%: INTRAVENOUS | Qty: 500 | Status: AC

## 2016-10-15 MED FILL — Electrolyte-R (PH 7.4) Solution: INTRAVENOUS | Qty: 1000 | Status: AC

## 2016-10-15 MED FILL — Sodium Bicarbonate IV Soln 8.4%: INTRAVENOUS | Qty: 50 | Status: AC

## 2016-10-15 MED FILL — Lidocaine HCl IV Inj 20 MG/ML: INTRAVENOUS | Qty: 5 | Status: AC

## 2016-10-15 SURGICAL SUPPLY — 119 items
ADAPTER CARDIO PERF ANTE/RETRO (ADAPTER) ×4 IMPLANT
ADH SKN CLS APL DERMABOND .7 (GAUZE/BANDAGES/DRESSINGS) ×2
ADPR PRFSN 84XANTGRD RTRGD (ADAPTER) ×2
AGENT HMST KT MTR STRL THRMB (HEMOSTASIS) ×2
BAG DECANTER FOR FLEXI CONT (MISCELLANEOUS) ×4 IMPLANT
BANDAGE ACE 4X5 VEL STRL LF (GAUZE/BANDAGES/DRESSINGS) ×4 IMPLANT
BANDAGE ACE 6X5 VEL STRL LF (GAUZE/BANDAGES/DRESSINGS) ×4 IMPLANT
BASKET HEART  (ORDER IN 25'S) (MISCELLANEOUS) ×1
BASKET HEART (ORDER IN 25'S) (MISCELLANEOUS) ×1
BASKET HEART (ORDER IN 25S) (MISCELLANEOUS) ×2 IMPLANT
BLADE STERNUM SYSTEM 6 (BLADE) ×4 IMPLANT
BLADE SURG 11 STRL SS (BLADE) ×2 IMPLANT
BLADE SURG 12 STRL SS (BLADE) ×4 IMPLANT
BLADE SURG ROTATE 9660 (MISCELLANEOUS) ×2 IMPLANT
BNDG GAUZE ELAST 4 BULKY (GAUZE/BANDAGES/DRESSINGS) ×4 IMPLANT
CANISTER SUCTION 2500CC (MISCELLANEOUS) ×4 IMPLANT
CANN PRFSN 3/8XCNCT ST RT ANG (MISCELLANEOUS) ×2
CANN PRFSN 3/8XRT ANG TPR 14 (MISCELLANEOUS) ×2
CANNULA GUNDRY RCSP 15FR (MISCELLANEOUS) ×4 IMPLANT
CANNULA PRFSN 3/8XCNCT RT ANG (MISCELLANEOUS) IMPLANT
CANNULA PRFSN 3/8XRT ANG TPR14 (MISCELLANEOUS) IMPLANT
CANNULA SUMP PERICARDIAL (CANNULA) ×2 IMPLANT
CANNULA VEN MTL TIP RT (MISCELLANEOUS) ×8
CATH CPB KIT VANTRIGT (MISCELLANEOUS) ×4 IMPLANT
CATH ROBINSON RED A/P 18FR (CATHETERS) ×12 IMPLANT
CATH THORACIC 36FR RT ANG (CATHETERS) ×4 IMPLANT
CLIP TI WIDE RED SMALL 24 (CLIP) ×2 IMPLANT
CONN 1/2X1/2X1/2  BEN (MISCELLANEOUS) ×2
CONN 1/2X1/2X1/2 BEN (MISCELLANEOUS) IMPLANT
CONN 3/8X1/2 ST GISH (MISCELLANEOUS) ×4 IMPLANT
COVER MAYO STAND STRL (DRAPES) ×2 IMPLANT
CRADLE DONUT ADULT HEAD (MISCELLANEOUS) ×4 IMPLANT
DERMABOND ADVANCED (GAUZE/BANDAGES/DRESSINGS) ×2
DERMABOND ADVANCED .7 DNX12 (GAUZE/BANDAGES/DRESSINGS) IMPLANT
DRAIN CHANNEL 32F RND 10.7 FF (WOUND CARE) ×4 IMPLANT
DRAPE CARDIOVASCULAR INCISE (DRAPES) ×4
DRAPE SLUSH/WARMER DISC (DRAPES) ×4 IMPLANT
DRAPE SRG 135X102X78XABS (DRAPES) ×2 IMPLANT
DRSG AQUACEL AG ADV 3.5X14 (GAUZE/BANDAGES/DRESSINGS) ×4 IMPLANT
ELECT BLADE 4.0 EZ CLEAN MEGAD (MISCELLANEOUS) ×4
ELECT BLADE 6.5 EXT (BLADE) ×4 IMPLANT
ELECT CAUTERY BLADE 6.4 (BLADE) ×4 IMPLANT
ELECT REM PT RETURN 9FT ADLT (ELECTROSURGICAL) ×8
ELECTRODE BLDE 4.0 EZ CLN MEGD (MISCELLANEOUS) ×2 IMPLANT
ELECTRODE REM PT RTRN 9FT ADLT (ELECTROSURGICAL) ×4 IMPLANT
FELT TEFLON 1X6 (MISCELLANEOUS) ×8 IMPLANT
GAUZE SPONGE 4X4 12PLY STRL (GAUZE/BANDAGES/DRESSINGS) ×8 IMPLANT
GLOVE BIO SURGEON STRL SZ 6.5 (GLOVE) ×4 IMPLANT
GLOVE BIO SURGEON STRL SZ7 (GLOVE) ×4 IMPLANT
GLOVE BIO SURGEON STRL SZ7.5 (GLOVE) ×14 IMPLANT
GLOVE BIO SURGEONS STRL SZ 6.5 (GLOVE) ×4
GLOVE BIOGEL PI IND STRL 6.5 (GLOVE) IMPLANT
GLOVE BIOGEL PI IND STRL 7.0 (GLOVE) IMPLANT
GLOVE BIOGEL PI INDICATOR 6.5 (GLOVE) ×6
GLOVE BIOGEL PI INDICATOR 7.0 (GLOVE) ×8
GOWN STRL REUS W/ TWL LRG LVL3 (GOWN DISPOSABLE) ×8 IMPLANT
GOWN STRL REUS W/ TWL XL LVL3 (GOWN DISPOSABLE) IMPLANT
GOWN STRL REUS W/TWL LRG LVL3 (GOWN DISPOSABLE) ×24
GOWN STRL REUS W/TWL XL LVL3 (GOWN DISPOSABLE) ×8
HEMOSTAT POWDER SURGIFOAM 1G (HEMOSTASIS) ×12 IMPLANT
HEMOSTAT SURGICEL 2X14 (HEMOSTASIS) ×4 IMPLANT
INSERT FOGARTY XLG (MISCELLANEOUS) IMPLANT
KIT BASIN OR (CUSTOM PROCEDURE TRAY) ×4 IMPLANT
KIT ROOM TURNOVER OR (KITS) ×4 IMPLANT
KIT SUCTION CATH 14FR (SUCTIONS) ×4 IMPLANT
KIT VASOVIEW HEMOPRO VH 3000 (KITS) ×4 IMPLANT
LEAD PACING MYOCARDI (MISCELLANEOUS) ×4 IMPLANT
LINE VENT (MISCELLANEOUS) ×2 IMPLANT
LOOP VESSEL SUPERMAXI WHITE (MISCELLANEOUS) ×2 IMPLANT
MARKER GRAFT CORONARY BYPASS (MISCELLANEOUS) ×12 IMPLANT
NS IRRIG 1000ML POUR BTL (IV SOLUTION) ×22 IMPLANT
PACK OPEN HEART (CUSTOM PROCEDURE TRAY) ×4 IMPLANT
PAD ARMBOARD 7.5X6 YLW CONV (MISCELLANEOUS) ×8 IMPLANT
PAD ELECT DEFIB RADIOL ZOLL (MISCELLANEOUS) ×4 IMPLANT
PENCIL BUTTON HOLSTER BLD 10FT (ELECTRODE) ×4 IMPLANT
PUNCH AORTIC ROTATE  4.5MM 8IN (MISCELLANEOUS) ×2 IMPLANT
PUNCH AORTIC ROTATE 4.0MM (MISCELLANEOUS) IMPLANT
PUNCH AORTIC ROTATE 4.5MM 8IN (MISCELLANEOUS) IMPLANT
PUNCH AORTIC ROTATE 5MM 8IN (MISCELLANEOUS) IMPLANT
SENSOR MYOCARDIAL TEMP (MISCELLANEOUS) ×2 IMPLANT
SET CARDIOPLEGIA MPS 5001102 (MISCELLANEOUS) ×2 IMPLANT
SPONGE LAP 4X18 X RAY DECT (DISPOSABLE) ×2 IMPLANT
SURGIFLO W/THROMBIN 8M KIT (HEMOSTASIS) ×6 IMPLANT
SUT BONE WAX W31G (SUTURE) ×4 IMPLANT
SUT ETHIBOND 2 0 SH (SUTURE) ×4
SUT ETHIBOND 2 0 SH 36X2 (SUTURE) IMPLANT
SUT MNCRL AB 4-0 PS2 18 (SUTURE) ×2 IMPLANT
SUT PROLENE 3 0 SH DA (SUTURE) ×4 IMPLANT
SUT PROLENE 3 0 SH1 36 (SUTURE) ×6 IMPLANT
SUT PROLENE 4 0 RB 1 (SUTURE) ×24
SUT PROLENE 4 0 SH DA (SUTURE) ×12 IMPLANT
SUT PROLENE 4-0 RB1 .5 CRCL 36 (SUTURE) ×2 IMPLANT
SUT PROLENE 5 0 C 1 36 (SUTURE) IMPLANT
SUT PROLENE 6 0 C 1 30 (SUTURE) ×2 IMPLANT
SUT PROLENE 6 0 CC (SUTURE) ×14 IMPLANT
SUT PROLENE 8 0 BV175 6 (SUTURE) ×4 IMPLANT
SUT PROLENE BLUE 7 0 (SUTURE) ×4 IMPLANT
SUT SILK  1 MH (SUTURE)
SUT SILK 1 MH (SUTURE) IMPLANT
SUT SILK 1 TIES 10X30 (SUTURE) ×2 IMPLANT
SUT SILK 2 0 SH CR/8 (SUTURE) IMPLANT
SUT SILK 3 0 SH CR/8 (SUTURE) ×2 IMPLANT
SUT STEEL 6MS V (SUTURE) ×8 IMPLANT
SUT STEEL SZ 6 DBL 3X14 BALL (SUTURE) ×4 IMPLANT
SUT VIC AB 1 CTX 36 (SUTURE) ×12
SUT VIC AB 1 CTX36XBRD ANBCTR (SUTURE) ×4 IMPLANT
SUT VIC AB 2-0 CT1 27 (SUTURE) ×4
SUT VIC AB 2-0 CT1 TAPERPNT 27 (SUTURE) IMPLANT
SUT VIC AB 2-0 CTX 27 (SUTURE) IMPLANT
SUT VIC AB 3-0 X1 27 (SUTURE) IMPLANT
SUTURE E-PAK OPEN HEART (SUTURE) ×4 IMPLANT
SYSTEM SAHARA CHEST DRAIN ATS (WOUND CARE) ×4 IMPLANT
TOWEL OR 17X24 6PK STRL BLUE (TOWEL DISPOSABLE) ×8 IMPLANT
TOWEL OR 17X26 10 PK STRL BLUE (TOWEL DISPOSABLE) ×8 IMPLANT
TRAY FOLEY IC TEMP SENS 16FR (CATHETERS) ×4 IMPLANT
TUBING INSUFFLATION (TUBING) ×4 IMPLANT
UNDERPAD 30X30 (UNDERPADS AND DIAPERS) ×4 IMPLANT
WATER STERILE IRR 1000ML POUR (IV SOLUTION) ×8 IMPLANT
YANKAUER SUCT BULB TIP NO VENT (SUCTIONS) ×2 IMPLANT

## 2016-10-15 NOTE — Progress Notes (Signed)
  Echocardiogram Echocardiogram Transesophageal has been performed.  Jennette Dubin 10/15/2016, 9:14 AM

## 2016-10-15 NOTE — Anesthesia Procedure Notes (Signed)
Procedure Name: Intubation Date/Time: 10/15/2016 7:50 AM Performed by: Clearnce Sorrel Pre-anesthesia Checklist: Patient identified, Emergency Drugs available, Suction available, Patient being monitored and Timeout performed Patient Re-evaluated:Patient Re-evaluated prior to inductionOxygen Delivery Method: Circle system utilized Preoxygenation: Pre-oxygenation with 100% oxygen Intubation Type: IV induction Ventilation: Mask ventilation without difficulty, Oral airway inserted - appropriate to patient size and Two handed mask ventilation required Laryngoscope Size: Mac and 4 Grade View: Grade III Tube type: Oral Tube size: 8.0 mm Number of attempts: 2 Airway Equipment and Method: Stylet and Bougie stylet Placement Confirmation: ETT inserted through vocal cords under direct vision,  positive ETCO2 and breath sounds checked- equal and bilateral Secured at: 23 cm Tube secured with: Tape Dental Injury: Teeth and Oropharynx as per pre-operative assessment

## 2016-10-15 NOTE — Telephone Encounter (Signed)
Refill denied. Pt in hospital having surgery.

## 2016-10-15 NOTE — Telephone Encounter (Signed)
Pt admitted 10/15/16 for CABG

## 2016-10-15 NOTE — Progress Notes (Signed)
The patient was examined and preop studies reviewed. There has been no change from the prior exam and the patient is ready for surgery.  plan CABG and closure of PFO on D [facility]

## 2016-10-15 NOTE — Anesthesia Postprocedure Evaluation (Signed)
Anesthesia Post Note  Patient: Richard Ellis  Procedure(s) Performed: Procedure(s) (LRB): CORONARY ARTERY BYPASS GRAFTING (CABG), ON PUMP, TIMES TWO, USING LEFT INTERNAL MAMMARY ARTERY AND RIGHT GREATER SAPHENOUS VEIN HARVESTED ENDOSCOPICALLY (N/A) TRANSESOPHAGEAL ECHOCARDIOGRAM (TEE) (N/A) REPAIR OF PATENT FORAMEN OVALE (N/A)  Patient location during evaluation: SICU Anesthesia Type: General Level of consciousness: sedated Pain management: pain level controlled Vital Signs Assessment: post-procedure vital signs reviewed and stable Respiratory status: patient remains intubated per anesthesia plan Cardiovascular status: stable Anesthetic complications: no       Last Vitals:  Vitals:   10/15/16 1400 10/15/16 1415  BP: 90/64   Pulse: 80 80  Resp: 14 14  Temp: (!) 35.9 C (!) 35.7 C    Last Pain:  Vitals:   10/15/16 1415  TempSrc: Core (Comment)                 Tiajuana Amass

## 2016-10-15 NOTE — OR Nursing (Signed)
Forty-five minute call to SICU charge nurse at 1209. Spoke to [employer].

## 2016-10-15 NOTE — Progress Notes (Signed)
CT surgery p.m. Rounds  Status post CABG and closure of PFO Perioperative coagulopathy now improved after factor replacement Hemodynamic stable Atrially paced with adequate cardiac index We'll plan on ventilator wean if he remains stable on PEEP 5 cm H2O

## 2016-10-15 NOTE — Transfer of Care (Signed)
Immediate Anesthesia Transfer of Care Note  Patient: Richard Ellis  Procedure(s) Performed: Procedure(s) with comments: CORONARY ARTERY BYPASS GRAFTING (CABG), ON PUMP, TIMES TWO, USING LEFT INTERNAL MAMMARY ARTERY AND RIGHT GREATER SAPHENOUS VEIN HARVESTED ENDOSCOPICALLY (N/A) - LIMA to LAD, SVG to DIAGONAL TRANSESOPHAGEAL ECHOCARDIOGRAM (TEE) (N/A) REPAIR OF PATENT FORAMEN OVALE (N/A)  Patient Location: SICU  Anesthesia Type:General  Level of Consciousness: Patient remains intubated per anesthesia plan  Airway & Oxygen Therapy: Patient remains intubated per anesthesia plan  Post-op Assessment: Report given to RN and Post -op Vital signs reviewed and stable  Post vital signs: Reviewed and stable  Last Vitals:  Vitals:   10/15/16 0601  BP: 133/76  Pulse: (!) 37  Resp: 16  Temp: 37.1 C    Last Pain:  Vitals:   10/15/16 0601  TempSrc: Oral      Patients Stated Pain Goal: 3 ([id number])  Complications: No apparent anesthesia complications

## 2016-10-15 NOTE — Anesthesia Procedure Notes (Addendum)
Central Venous Catheter Insertion Performed by: Roderic Palau, anesthesiologist Start/End1/29/2018 6:45 AM, 10/15/2016 6:55 AM Patient location: Pre-op. Preanesthetic checklist: patient identified, IV checked, site marked, risks and benefits discussed, surgical consent, monitors and equipment checked, pre-op evaluation, timeout performed and anesthesia consent Position: Trendelenburg Lidocaine 1% used for infiltration and patient sedated Hand hygiene performed , maximum sterile barriers used  and Seldinger technique used Catheter size: 9 Fr Total catheter length 10. Central line was placed.MAC introducer Swan type:thermodilution PA Cath depth:50 Procedure performed using ultrasound guided technique. Ultrasound Notes:anatomy identified, needle tip was noted to be adjacent to the nerve/plexus identified, no ultrasound evidence of intravascular and/or intraneural injection and image(s) printed for medical record Attempts: 1 Following insertion, line sutured and dressing applied. Post procedure assessment: blood return through all ports, free fluid flow and no air  Patient tolerated the procedure well with no immediate complications.

## 2016-10-15 NOTE — Procedures (Signed)
Extubation Procedure Note  Patient Details:   Name: Richard Ellis DOB: Dec 20, 1945 MRN: [id number]   Airway Documentation:  Airway 8 mm (Active)  Secured at (cm) 23 cm 10/15/2016 10:03 PM  Measured From Lips 10/15/2016 10:03 PM  Secured Location Right 10/15/2016 10:03 PM  Secured By Pink Tape 10/15/2016 10:03 PM  Site Condition Dry 10/15/2016  4:06 PM    Evaluation  O2 sats: stable throughout Complications: No apparent complications Patient did tolerate procedure well. Bilateral Breath Sounds: Clear, Diminished   Yes   Patient performed NIF -20 and VC 1.1 [place] IS 355ml with positive cuff leak. Patient extubated to 4 L [place]   Carrington Clamp A 10/15/2016, 10:42 PM

## 2016-10-15 NOTE — Brief Op Note (Signed)
10/15/2016  11:41 AM  PATIENT:  Richard Ellis  71 y.o. male  PRE-OPERATIVE DIAGNOSIS:  CAD  POST-OPERATIVE DIAGNOSIS:  CAD  PROCEDURE:  TRANSESOPHAGEAL ECHOCARDIOGRAM (TEE),MEDIAN STERNOTOMY for CORONARY ARTERY BYPASS GRAFTING (CABG)x 2 (LIMA to LAD, SVG to DIAGONAL) USING LEFT INTERNAL MAMMARY ARTERY AND RIGHT GREATER SAPHENOUS VEIN HARVESTED ENDOSCOPICALLY, and  REPAIR OF PATENT FORAMEN OVALE   SURGEON:  Surgeon(s) and Role:    * Ivin Poot, MD - Primary  PHYSICIAN ASSISTANT: Lars Pinks PA-C  ANESTHESIA:   general  EBL:  Total I/O In: -  Out: 400 [Urine:400]  BLOOD ADMINISTERED:Two FFP and one PLTS  DRAINS: Chest tubes placed in the mediastinal and pleural spaces   COUNTS CORRECT:  YES  DICTATION: .Dragon Dictation  PLAN OF CARE: Admit to inpatient   PATIENT DISPOSITION:  ICU - intubated and hemodynamically stable.   Delay start of Pharmacological VTE agent (>24hrs) due to surgical blood loss or risk of bleeding: yes  BASELINE WEIGHT: 83.1 kg

## 2016-10-16 ENCOUNTER — Encounter (HOSPITAL_COMMUNITY): Payer: Self-pay | Admitting: Cardiothoracic Surgery

## 2016-10-16 ENCOUNTER — Inpatient Hospital Stay (HOSPITAL_COMMUNITY): Payer: Medicare Other

## 2016-10-16 LAB — PREPARE PLATELET PHERESIS
Blood Product Expiration Date: 201801292359
Blood Product Expiration Date: 201801302359
ISSUE DATE / TIME: 201801291104
ISSUE DATE / TIME: 201801291404
Unit Type and Rh: 8400
Unit Type and Rh: 6200

## 2016-10-16 LAB — GLUCOSE, CAPILLARY
Glucose-Capillary: 101 mg/dL — ABNORMAL HIGH (ref 65–99)
Glucose-Capillary: 107 mg/dL — ABNORMAL HIGH (ref 65–99)
Glucose-Capillary: 127 mg/dL — ABNORMAL HIGH (ref 65–99)
Glucose-Capillary: 129 mg/dL — ABNORMAL HIGH (ref 65–99)
Glucose-Capillary: 138 mg/dL — ABNORMAL HIGH (ref 65–99)
Glucose-Capillary: 163 mg/dL — ABNORMAL HIGH (ref 65–99)
Glucose-Capillary: 167 mg/dL — ABNORMAL HIGH (ref 65–99)

## 2016-10-16 LAB — CBC
HCT: 24.3 % — ABNORMAL LOW (ref 39.0–52.0)
HCT: 25.2 % — ABNORMAL LOW (ref 39.0–52.0)
Hemoglobin: 8.5 g/dL — ABNORMAL LOW (ref 13.0–17.0)
Hemoglobin: 8.6 g/dL — ABNORMAL LOW (ref 13.0–17.0)
MCH: 32.2 pg (ref 26.0–34.0)
MCH: 31.9 pg (ref 26.0–34.0)
MCHC: 35 g/dL (ref 30.0–36.0)
MCHC: 34.1 g/dL (ref 30.0–36.0)
MCV: 92 fL (ref 78.0–100.0)
MCV: 93.3 fL (ref 78.0–100.0)
Platelets: 114 10*3/uL — ABNORMAL LOW (ref 150–400)
Platelets: 121 10*3/uL — ABNORMAL LOW (ref 150–400)
RBC: 2.64 MIL/uL — ABNORMAL LOW (ref 4.22–5.81)
RBC: 2.7 MIL/uL — ABNORMAL LOW (ref 4.22–5.81)
RDW: 12.5 % (ref 11.5–15.5)
RDW: 12.6 % (ref 11.5–15.5)
WBC: 8.3 10*3/uL (ref 4.0–10.5)
WBC: 9.2 10*3/uL (ref 4.0–10.5)

## 2016-10-16 LAB — PREPARE FRESH FROZEN PLASMA
Blood Product Expiration Date: 201802012359
Blood Product Expiration Date: 201802012359
ISSUE DATE / TIME: 201801291104
ISSUE DATE / TIME: 201801291104
Unit Type and Rh: 6200
Unit Type and Rh: 6200

## 2016-10-16 LAB — POCT I-STAT, CHEM 8
BUN: 14 mg/dL (ref 6–20)
Calcium, Ion: 1.27 mmol/L (ref 1.15–1.40)
Chloride: 96 mmol/L — ABNORMAL LOW (ref 101–111)
Creatinine, Ser: 0.8 mg/dL (ref 0.61–1.24)
Glucose, Bld: 170 mg/dL — ABNORMAL HIGH (ref 65–99)
HCT: 23 % — ABNORMAL LOW (ref 39.0–52.0)
Hemoglobin: 7.8 g/dL — ABNORMAL LOW (ref 13.0–17.0)
Potassium: 3.6 mmol/L (ref 3.5–5.1)
Sodium: 131 mmol/L — ABNORMAL LOW (ref 135–145)
TCO2: 27 mmol/L (ref 0–100)

## 2016-10-16 LAB — BASIC METABOLIC PANEL
Anion gap: 6 (ref 5–15)
BUN: 15 mg/dL (ref 6–20)
CO2: 25 mmol/L (ref 22–32)
Calcium: 8.2 mg/dL — ABNORMAL LOW (ref 8.9–10.3)
Chloride: 107 mmol/L (ref 101–111)
Creatinine, Ser: 0.84 mg/dL (ref 0.61–1.24)
GFR calc Af Amer: >60 mL/min (ref >60)
GFR calc non Af Amer: >60 mL/min (ref >60)
Glucose, Bld: 128 mg/dL — ABNORMAL HIGH (ref 65–99)
Potassium: 4.1 mmol/L (ref 3.5–5.1)
Sodium: 138 mmol/L (ref 135–145)

## 2016-10-16 LAB — MAGNESIUM
Magnesium: 2.2 mg/dL (ref 1.7–2.4)
Magnesium: 2.1 mg/dL (ref 1.7–2.4)

## 2016-10-16 LAB — CREATININE, SERUM
Creatinine, Ser: 0.84 mg/dL (ref 0.61–1.24)
GFR calc Af Amer: >60 mL/min (ref >60)
GFR calc non Af Amer: >60 mL/min (ref >60)

## 2016-10-16 LAB — PREPARE CRYOPRECIPITATE
Blood Product Expiration Date: 201801292000
Blood Product Expiration Date: 201801292045
ISSUE DATE / TIME: 201801291451
ISSUE DATE / TIME: 201801291531
Unit Type and Rh: 6200
Unit Type and Rh: 6200

## 2016-10-16 MED ORDER — INSULIN ASPART 100 UNIT/ML ~~LOC~~ SOLN
0.0000 [IU] | SUBCUTANEOUS | Status: DC
  Administered 2016-10-16 (×2): 2 [IU] via SUBCUTANEOUS

## 2016-10-16 MED ORDER — ASPIRIN EC 81 MG PO TBEC
81.0000 mg | DELAYED_RELEASE_TABLET | Freq: Every day | ORAL | Status: DC
  Administered 2016-10-16 – 2016-10-20 (×5): 81 mg via ORAL
  Filled 2016-10-16 (×5): qty 1

## 2016-10-16 MED ORDER — FE FUMARATE-B12-VIT C-FA-IFC PO CAPS
1.0000 | ORAL_CAPSULE | Freq: Three times a day (TID) | ORAL | Status: DC
  Administered 2016-10-16 – 2016-10-20 (×11): 1 via ORAL
  Filled 2016-10-16 (×12): qty 1

## 2016-10-16 MED ORDER — FUROSEMIDE 10 MG/ML IJ SOLN
20.0000 mg | Freq: Two times a day (BID) | INTRAMUSCULAR | Status: DC
  Administered 2016-10-16 – 2016-10-17 (×3): 20 mg via INTRAVENOUS
  Filled 2016-10-16 (×3): qty 2

## 2016-10-16 MED ORDER — SODIUM CHLORIDE 0.9 % IV SOLN
30.0000 meq | Freq: Once | INTRAVENOUS | Status: AC
  Administered 2016-10-16: 30 meq via INTRAVENOUS
  Filled 2016-10-16: qty 15

## 2016-10-16 MED ORDER — ORAL CARE MOUTH RINSE
15.0000 mL | Freq: Two times a day (BID) | OROMUCOSAL | Status: DC
  Administered 2016-10-16 – 2016-10-20 (×6): 15 mL via OROMUCOSAL

## 2016-10-16 NOTE — Progress Notes (Addendum)
Patient ID: Richard Ellis, male   DOB: 1946-06-14, 71 y.o.   MRN: [id number] EVENING ROUNDS NOTE :     [address]       [place] [address]             [phone]                 1 Day Post-Op Procedure(s) (LRB): CORONARY ARTERY BYPASS GRAFTING (CABG), ON PUMP, TIMES TWO, USING LEFT INTERNAL MAMMARY ARTERY AND RIGHT GREATER SAPHENOUS VEIN HARVESTED ENDOSCOPICALLY (N/A) TRANSESOPHAGEAL ECHOCARDIOGRAM (TEE) (N/A) REPAIR OF PATENT FORAMEN OVALE (N/A)  Total Length of Stay:  LOS: 1 day  BP 114/70   Pulse 80   Temp 98.2 F (36.8 C) (Oral)   Resp (!) 21   Ht 6\' 1"  (1.854 m)   Wt 196 lb 3.4 oz (89 kg)   SpO2 100%   BMI 25.89 kg/m   .Intake/Output      01/29 0701 - 01/30 0700 01/30 0701 - 01/31 0700   P.O. 480    I.V. (mL/kg) 3937.1 (44.2) 770 (8.7)   Blood 2456.8    NG/GT 30    IV Piggyback 600    Total Intake(mL/kg) 7503.8 (84.3) 770 (8.7)   Urine (mL/kg/hr) 2905 (1.4) 1425 (1.4)   Emesis/NG output 100 (0)    Blood 1200 (0.6)    Chest Tube 645 (0.3) 245 (0.2)   Total Output 4850 1670   Net +2653.8 -900          . sodium chloride 20 mL/hr at 10/16/16 1800  . sodium chloride    . sodium chloride 20 mL/hr at 10/16/16 1800  . lactated ringers 10 mL/hr at 10/16/16 1800  . lactated ringers 20 mL/hr at 10/16/16 1800  . nitroGLYCERIN Stopped (10/15/16 2330)  . phenylephrine (NEO-SYNEPHRINE) Adult infusion Stopped (10/15/16 1801)     Lab Results  Component Value Date   WBC 9.2 10/16/2016   HGB 7.8 (L) 10/16/2016   HCT 23.0 (L) 10/16/2016   PLT 121 (L) 10/16/2016   GLUCOSE 170 (H) 10/16/2016   CHOL 122 (L) 04/18/2016   TRIG 115 04/18/2016   HDL 52 04/18/2016   LDLDIRECT 122.0 05/09/2015   LDLCALC 47 04/18/2016   ALT 20 10/11/2016   AST 27 10/11/2016   NA 131 (L) 10/16/2016   K 3.6 10/16/2016   CL 96 (L) 10/16/2016   CREATININE 0.80 10/16/2016   BUN 14 10/16/2016   CO2 25 10/16/2016   TSH 1.88 04/19/2016   PSA 4.83 (H) 04/19/2016   INR 1.33  10/15/2016   HGBA1C 5.6 10/11/2016  stable day, mostly complains  of back pain  Grace Isaac MD  Beeper [phone] Office [phone] 10/16/2016 6:47 PM

## 2016-10-16 NOTE — Care Management Note (Signed)
Case Management Note  Patient Details  Name: Richard Ellis MRN: [id number] Date of Birth: 17-Apr-1946  Subjective/Objective:    Pt lives with wife who is a physical therapist with [employer].  She noted that pt has been very active, exercises 6 days/wk and anticipates attending cardiac rehab.  She has taken FMLA and states that pt's brother plans to stay with him during the day for several weeks when she returns to work.                       Expected Discharge Plan:  Home/Self Care  Discharge planning Services  CM Consult  Status of Service:  In process, will continue to follow  Berton Mount, RN 10/16/2016, 2:56 PM

## 2016-10-16 NOTE — Progress Notes (Signed)
1 Day Post-Op Procedure(s) (LRB): CORONARY ARTERY BYPASS GRAFTING (CABG), ON PUMP, TIMES TWO, USING LEFT INTERNAL MAMMARY ARTERY AND RIGHT GREATER SAPHENOUS VEIN HARVESTED ENDOSCOPICALLY (N/A) TRANSESOPHAGEAL ECHOCARDIOGRAM (TEE) (N/A) REPAIR OF PATENT FORAMEN OVALE (N/A) Subjective: Stable postop CABG, PFO closure A- paced CXR clear Chest tube output 300 cc last 12 hrs Objective: Vital signs in last 24 hours: Temp:  [96.3 F (35.7 C)-99 F (37.2 C)] 98.4 F (36.9 C) (01/30 0800) Pulse Rate:  [69-90] 80 (01/30 0800) Cardiac Rhythm: Atrial paced (01/30 0800) Resp:  [12-27] 22 (01/30 0800) BP: (90-118)/(53-78) 103/64 (01/30 0800) SpO2:  [97 %-100 %] 100 % (01/30 0800) Arterial Line BP: (99-151)/(51-69) 151/62 (01/30 0800) FiO2 (%):  [40 %-50 %] 40 % (01/29 2203) Weight:  [196 lb 3.4 oz (89 kg)] 196 lb 3.4 oz (89 kg) (01/30 0500)  Hemodynamic parameters for last 24 hours: PAP: (16-29)/(8-18) 28/13 CO:  [7.1 L/min-9.5 L/min] 9.5 L/min CI:  [3.4 L/min/m2-4.6 L/min/m2] 4.6 L/min/m2  Intake/Output from previous day: 01/29 0701 - 01/30 0700 In: 7503.8 [P.O.:480; I.V.:3937.1; Blood:2456.8; NG/GT:30; IV Piggyback:600] Out: [id number] [Urine:2905; Emesis/NG output:100; Blood:1200; Chest Tube:645] Intake/Output this shift: Total I/O In: -  Out: 160 [Urine:125; Chest Tube:35]       Exam    General- alert and comfortable   Lungs- clear without rales, wheezes   Cor- regular rate and rhythm, no murmur , gallop   Abdomen- soft, non-tender   Extremities - warm, non-tender, minimal edema   Neuro- oriented, appropriate, no focal weakness   Lab Results:  Recent Labs  10/15/16 1930 10/16/16 0400  WBC 8.8 8.3  HGB 8.7* 8.5*  HCT 25.0* 24.3*  PLT 140* 114*   BMET:  Recent Labs  10/15/16 1929 10/15/16 1930 10/16/16 0400  NA 140  --  138  K 4.5  --  4.1  CL 105  --  107  CO2  --   --  25  GLUCOSE 135*  --  128*  BUN 17  --  15  CREATININE 0.80 0.96 0.84  CALCIUM  --   --   8.2*    PT/INR:  Recent Labs  10/15/16 1346  LABPROT 16.6*  INR 1.33   ABG    Component Value Date/Time   PHART 7.352 10/15/2016 2339   HCO3 24.0 10/15/2016 2339   TCO2 25 10/15/2016 2339   ACIDBASEDEF 1.0 10/15/2016 2339   O2SAT 94.0 10/15/2016 2339   CBG (last 3)   Recent Labs  10/16/16 0003 10/16/16 0352 10/16/16 0837  GLUCAP 167* 127* 107*    Assessment/Plan: S/P Procedure(s) (LRB): CORONARY ARTERY BYPASS GRAFTING (CABG), ON PUMP, TIMES TWO, USING LEFT INTERNAL MAMMARY ARTERY AND RIGHT GREATER SAPHENOUS VEIN HARVESTED ENDOSCOPICALLY (N/A) TRANSESOPHAGEAL ECHOCARDIOGRAM (TEE) (N/A) REPAIR OF PATENT FORAMEN OVALE (N/A) Mobilize Diuresis d/c tubes/lines See progression orders   LOS: 1 day    Tharon Aquas Trigt III 10/16/2016

## 2016-10-16 NOTE — Op Note (Signed)
NAME:  Richard Ellis, Richard Ellis NO.:  [id number]  MEDICAL RECORD NO.:  [id number]  LOCATION:  [facility]                         FACILITY:  [facility]  PHYSICIAN:  Ivin Poot, M.D.  DATE OF BIRTH:  1946/07/15  DATE OF PROCEDURE:  10/15/2016 DATE OF DISCHARGE:                              OPERATIVE REPORT   OPERATION: 1. Coronary artery bypass grafting x2 (left internal mammary artery to     LAD, saphenous vein graft to 1st diagonal). 2. Endoscopic harvest of right greater saphenous vein. 3. Closure of patent foramen ovale with interrupted sutures.  PREOPERATIVE DIAGNOSES: 1. Severe LAD -- diagonal stenosis with unstable angina, prior PCI. 2. Patent foramen ovale with history of cryptogenic stroke.  POSTOPERATIVE DIAGNOSES: 1. Severe LAD -- diagonal stenosis with unstable angina, prior PCI. 2. Patent foramen ovale with history of cryptogenic stroke.  SURGEON:  Ivin Poot, MD.  ASSISTANT:  Scheryl Darter, PA-C.  ANESTHESIA:  General by Dr. Marya Landry.  CLINICAL NOTE:  The patient is a 71 year old male with previous history of PCI of the LAD, who returns with recurrent angina of increasing intensity.  Echo previously had demonstrated a patent foramina ovale and he has history of cryptogenic stroke approximately 3 years ago.  Repeat cath showed new stenosis proximal to his LAD stent, which was heavily calcified at the takeoff of the diagonal.  Attempted PCI was unsuccessful and he was felt to be a candidate for surgical coronary artery revascularization after Plavix washout.  I examined the patient in the hospital following his cardiac catheterization and discussed the role of CABG for treatment of his severe CAD as well as surgical closure of his PFO.  I discussed the major aspects of the surgery including the use of general anesthesia and cardiopulmonary bypass, the location of the surgical incisions, and the expected postoperative hospital recovery.  I  discussed with the patient risks to him of coronary artery bypass grafting and closure of PFO, including the risk of stroke, MI, bleeding, blood transfusion requirement, postoperative infection, postoperative pulmonary problems including pleural effusion, and death. After reviewing these issues, he demonstrated his understanding and agreed to proceed with surgery under what I felt was an informed consent.  OPERATIVE FINDINGS: 1. Adequate conduit. 2. Adequate targets. 3. An 8-mm slit-like opening of the foramen ovale closed with     interrupted pledgeted sutures.  DESCRIPTION OF PROCEDURE:  The patient was brought to the operating room and placed supine on the operating table.  General anesthesia was induced under invasive hemodynamic monitoring.  The chest, abdomen, and legs were prepped with Betadine and draped as a sterile field.  A proper time-out was performed.  A transesophageal echo probe has been performed by the anesthesiologist, which confirmed the preoperative diagnosis of PFO.  A sternal incision was made as the saphenous vein was harvested endoscopically from the right leg.  The left internal mammary artery was harvested as a pedicle graft from its origin at the subclavian vessels.  Although, the patient stopped Plavix for 6 days, he still had significant coagulopathy-type diffuse bleeding.  The sternal retractor was placed.  The pericardium was opened and suspended.  Pursestrings were placed  in ascending aorta and superior vena cava.  Heparin was administered and when the ACT was documented as being therapeutic, the patient was cannulated in the ascending aorta and bi-cable cannulation for venous drainage and placed on cardiopulmonary bypass.  The coronaries were identified for grafting and a cardioplegia cannula was placed for antegrade aortic cardioplegia.  The mammary artery and vein grafts were prepared for the distal anastomoses.  The patient was cooled to 32  degrees.  The aortic crossclamp was applied.  A liter of cold blood cardioplegia was delivered with good cardioplegic arrest and septal temperature dropping less than 12 degrees. Cardioplegia was delivered every 20 minutes.  The distal coronary anastomoses were performed.  The first distal anastomosis was to the diagonal branch to LAD.  This had a proximal 80% calcified stenosis.  It was 1.5-mm vessel.  A reverse saphenous vein was sewn end-to-side with running 7-0 Prolene with good flow through the graft.  Cardioplegia was redosed.  The second distal anastomosis was the distal third of the LAD.  The left IMA pedicle was brought through an opening and the left lateral pericardium was brought down onto the LAD and sewn end-to-side with running 8-0 Prolene.  There was excellent flow through the anastomosis after briefly releasing the pedicle bulldog on the mammary artery.  The bulldog was reapplied and the pedicle was secured to the epicardium with 6-0 Prolene.  Cardioplegia was redosed.  While the crossclamp was still in place, a right atriotomy was performed as the cable tapes were tightened.  The fossa ovalis was inspected. There was a slit-like opening measuring approximately 6-8 mm.  This was closed with 2 pledgeted 4-0 Prolene sutures.  The atriotomy was closed in 2 layers using 4-0 Prolene.  While the crossclamp remained in place, the proximal vein anastomosis was performed on the ascending aorta using a 4.5-mm punch running 6-0 Prolene.  Prior to tying down final proximal anastomosis, air was vented from the coronaries with the usual maneuvers on bypass.  The crossclamp was removed.  The heart resumed a spontaneous rhythm.  The vein graft was de-aired and opened and had good flow.  Hemostasis was documented to the proximal distal anastomoses.  The patient was rewarmed and reperfused.  Temporary pacing wires were applied.  The lungs were expanded.  Ventilator was resumed.  The  patient was weaned from cardiopulmonary bypass without difficulty.  Protamine was administered without adverse reaction.  There was still considerable bleeding and coagulopathy.  The patient's platelet count was 100,000 and was given a unit of platelets.  He was also given 2 FFPs.  This improved coagulation function.  However, achieving hemostasis was time-consuming and tedious due to diffuse coagulopathy.  The cannulas were removed and the cannulation sites were hemostatic. The superior pericardial fat was closed over the aorta.  Anterior mediastinal left pleural chest tubes were placed and brought out through separate incisions.  The sternum was closed with wire.  Pectoralis fascia was closed with a running #1 Vicryl.  The subcutaneous and skin layers were closed in running Vicryl.  Total cardiopulmonary bypass time was 110 minutes.     Ivin Poot, M.D.     PV/MEDQ  D:  10/15/2016  T:  10/16/2016  Job:  [id number]  cc:   Juanda Bond. Burt Knack, MD

## 2016-10-17 ENCOUNTER — Ambulatory Visit: Payer: Federal, State, Local not specified - PPO | Admitting: Physician Assistant

## 2016-10-17 ENCOUNTER — Inpatient Hospital Stay (HOSPITAL_COMMUNITY): Payer: Medicare Other

## 2016-10-17 LAB — BASIC METABOLIC PANEL
Anion gap: 5 (ref 5–15)
BUN: 10 mg/dL (ref 6–20)
CO2: 28 mmol/L (ref 22–32)
Calcium: 8 mg/dL — ABNORMAL LOW (ref 8.9–10.3)
Chloride: 97 mmol/L — ABNORMAL LOW (ref 101–111)
Creatinine, Ser: 0.72 mg/dL (ref 0.61–1.24)
GFR calc Af Amer: >60 mL/min (ref >60)
GFR calc non Af Amer: >60 mL/min (ref >60)
Glucose, Bld: 114 mg/dL — ABNORMAL HIGH (ref 65–99)
Potassium: 3.5 mmol/L (ref 3.5–5.1)
Sodium: 130 mmol/L — ABNORMAL LOW (ref 135–145)

## 2016-10-17 LAB — GLUCOSE, CAPILLARY
Glucose-Capillary: 89 mg/dL (ref 65–99)
Glucose-Capillary: 114 mg/dL — ABNORMAL HIGH (ref 65–99)
Glucose-Capillary: 139 mg/dL — ABNORMAL HIGH (ref 65–99)
Glucose-Capillary: 120 mg/dL — ABNORMAL HIGH (ref 65–99)
Glucose-Capillary: 132 mg/dL — ABNORMAL HIGH (ref 65–99)

## 2016-10-17 LAB — CBC
HCT: 25.2 % — ABNORMAL LOW (ref 39.0–52.0)
Hemoglobin: 8.6 g/dL — ABNORMAL LOW (ref 13.0–17.0)
MCH: 31.5 pg (ref 26.0–34.0)
MCHC: 34.1 g/dL (ref 30.0–36.0)
MCV: 92.3 fL (ref 78.0–100.0)
Platelets: 123 10*3/uL — ABNORMAL LOW (ref 150–400)
RBC: 2.73 MIL/uL — ABNORMAL LOW (ref 4.22–5.81)
RDW: 12.2 % (ref 11.5–15.5)
WBC: 9.5 10*3/uL (ref 4.0–10.5)

## 2016-10-17 MED ORDER — SODIUM CHLORIDE 0.9% FLUSH
3.0000 mL | Freq: Two times a day (BID) | INTRAVENOUS | Status: DC
  Administered 2016-10-17 – 2016-10-20 (×3): 3 mL via INTRAVENOUS

## 2016-10-17 MED ORDER — FUROSEMIDE 40 MG PO TABS
40.0000 mg | ORAL_TABLET | Freq: Every day | ORAL | Status: DC
  Administered 2016-10-18 – 2016-10-20 (×3): 40 mg via ORAL
  Filled 2016-10-17 (×3): qty 1

## 2016-10-17 MED ORDER — MAGNESIUM HYDROXIDE 400 MG/5ML PO SUSP: 30.0000 mL | Freq: Every day | ORAL | Status: DC | PRN

## 2016-10-17 MED ORDER — SODIUM CHLORIDE 0.9% FLUSH
3.0000 mL | INTRAVENOUS | Status: DC | PRN
Start: 2016-10-17 — End: 2016-10-20
  Administered 2016-10-18 (×2): 3 mL via INTRAVENOUS
  Filled 2016-10-17 (×2): qty 3

## 2016-10-17 MED ORDER — POTASSIUM CHLORIDE CRYS ER 20 MEQ PO TBCR
20.0000 meq | EXTENDED_RELEASE_TABLET | Freq: Every day | ORAL | Status: DC
  Administered 2016-10-17 – 2016-10-20 (×4): 20 meq via ORAL
  Filled 2016-10-17 (×4): qty 1

## 2016-10-17 MED ORDER — MOVING RIGHT ALONG BOOK
Freq: Once | Status: AC
  Administered 2016-10-17: 12:00:00
  Filled 2016-10-17 (×2): qty 1

## 2016-10-17 MED ORDER — INSULIN ASPART 100 UNIT/ML ~~LOC~~ SOLN
0.0000 [IU] | Freq: Three times a day (TID) | SUBCUTANEOUS | Status: DC
  Administered 2016-10-17 (×2): 2 [IU] via SUBCUTANEOUS

## 2016-10-17 MED ORDER — POTASSIUM CHLORIDE 20 MEQ/15ML (10%) PO SOLN: 20.0000 meq | Freq: Four times a day (QID) | ORAL | Status: DC

## 2016-10-17 MED ORDER — POTASSIUM CHLORIDE CRYS ER 20 MEQ PO TBCR
20.0000 meq | EXTENDED_RELEASE_TABLET | Freq: Four times a day (QID) | ORAL | Status: DC
  Administered 2016-10-17: 20 meq via ORAL
  Filled 2016-10-17: qty 1

## 2016-10-17 MED ORDER — SODIUM CHLORIDE 0.9 % IV SOLN: 250.0000 mL | INTRAVENOUS | Status: DC | PRN

## 2016-10-17 NOTE — Significant Event (Signed)
Patient ambulated to 2W25, walked approximately 650 feet. Tolerated it well. VS stable prior and during the transfer. Patient settled in bed per his requests; spouse at bedside. No personal belongings at bedside of 2S01 that could be taken to new room. Report given to receiving RN Rufina Falco.  patient examined and medical record reviewed,agree with above note. Tharon Aquas Trigt III 10/19/2016

## 2016-10-17 NOTE — Significant Event (Signed)
Patient complain of chest pain after being repositioned in bed by his spouse. RN assessed patient. Patient described chest pain as aching, non-radiating pain. EKG obtained. Patient stating chest pain residing after finding a comfortable spot in bed. Dr. Prescott Gum paged to let him be aware.   Richard Ellis

## 2016-10-17 NOTE — Progress Notes (Signed)
2 Days Post-Op Procedure(s) (LRB): CORONARY ARTERY BYPASS GRAFTING (CABG), ON PUMP, TIMES TWO, USING LEFT INTERNAL MAMMARY ARTERY AND RIGHT GREATER SAPHENOUS VEIN HARVESTED ENDOSCOPICALLY (N/A) TRANSESOPHAGEAL ECHOCARDIOGRAM (TEE) (N/A) REPAIR OF PATENT FORAMEN OVALE (N/A) Subjective: Progressing well after CABG with closure PFO Serosanguineous chest tube output persists-patient on preop Plavix Maintaining sinus rhythm atrially paced at 80 Ready for transfer to stepdown  Objective: Vital signs in last 24 hours: Temp:  [97.8 F (36.6 C)-99.2 F (37.3 C)] 98.8 F (37.1 C) (01/31 0900) Pulse Rate:  [80-81] 80 (01/31 1000) Cardiac Rhythm: Atrial paced (01/31 0745) Resp:  [7-28] 9 (01/31 1000) BP: (101-132)/(64-88) 110/66 (01/31 1000) SpO2:  [91 %-100 %] 97 % (01/31 1000) Weight:  [194 lb 10.7 oz (88.3 kg)] 194 lb 10.7 oz (88.3 kg) (01/31 0500)  Hemodynamic parameters for last 24 hours:  stable  Intake/Output from previous day: 01/30 0701 - 01/31 0700 In: 1345 [I.V.:1030; IV Piggyback:315] Out: 3165 [Urine:2600; Chest Tube:565] Intake/Output this shift: Total I/O In: 180 [P.O.:120; I.V.:60] Out: 710 [Urine:660; Chest Tube:50]       Exam    General- alert and comfortable   Lungs- clear without rales, wheezes   Cor- regular rate and rhythm, no murmur , gallop   Abdomen- soft, non-tender   Extremities - warm, non-tender, minimal edema   Neuro- oriented, appropriate, no focal weakness   Lab Results:  Recent Labs  10/16/16 1633 10/16/16 1634 10/17/16 0400  WBC 9.2  --  9.5  HGB 8.6* 7.8* 8.6*  HCT 25.2* 23.0* 25.2*  PLT 121*  --  123*   BMET:  Recent Labs  10/16/16 0400  10/16/16 1634 10/17/16 0400  NA 138  --  131* 130*  K 4.1  --  3.6 3.5  CL 107  --  96* 97*  CO2 25  --   --  28  GLUCOSE 128*  --  170* 114*  BUN 15  --  14 10  CREATININE 0.84  < > 0.80 0.72  CALCIUM 8.2*  --   --  8.0*  < > = values in this interval not displayed.  PT/INR:  Recent  Labs  10/15/16 1346  LABPROT 16.6*  INR 1.33   ABG    Component Value Date/Time   PHART 7.352 10/15/2016 2339   HCO3 24.0 10/15/2016 2339   TCO2 27 10/16/2016 1634   ACIDBASEDEF 1.0 10/15/2016 2339   O2SAT 94.0 10/15/2016 2339   CBG (last 3)   Recent Labs  10/16/16 2337 10/17/16 0400 10/17/16 0857  GLUCAP 101* 89 114*    Assessment/Plan: S/P Procedure(s) (LRB): CORONARY ARTERY BYPASS GRAFTING (CABG), ON PUMP, TIMES TWO, USING LEFT INTERNAL MAMMARY ARTERY AND RIGHT GREATER SAPHENOUS VEIN HARVESTED ENDOSCOPICALLY (N/A) TRANSESOPHAGEAL ECHOCARDIOGRAM (TEE) (N/A) REPAIR OF PATENT FORAMEN OVALE (N/A) Mobilize Diuresis Plan for transfer to step-down: see transfer orders Hold Plavix   LOS: 2 days    Richard Ellis 10/17/2016

## 2016-10-18 ENCOUNTER — Inpatient Hospital Stay (HOSPITAL_COMMUNITY): Payer: Medicare Other

## 2016-10-18 LAB — BASIC METABOLIC PANEL
Anion gap: 5 (ref 5–15)
BUN: 10 mg/dL (ref 6–20)
CO2: 31 mmol/L (ref 22–32)
Calcium: 8.6 mg/dL — ABNORMAL LOW (ref 8.9–10.3)
Chloride: 99 mmol/L — ABNORMAL LOW (ref 101–111)
Creatinine, Ser: 0.85 mg/dL (ref 0.61–1.24)
GFR calc Af Amer: >60 mL/min (ref >60)
GFR calc non Af Amer: >60 mL/min (ref >60)
Glucose, Bld: 132 mg/dL — ABNORMAL HIGH (ref 65–99)
Potassium: 4.4 mmol/L (ref 3.5–5.1)
Sodium: 135 mmol/L (ref 135–145)

## 2016-10-18 LAB — CBC
HCT: 25.1 % — ABNORMAL LOW (ref 39.0–52.0)
Hemoglobin: 8.5 g/dL — ABNORMAL LOW (ref 13.0–17.0)
MCH: 31.5 pg (ref 26.0–34.0)
MCHC: 33.9 g/dL (ref 30.0–36.0)
MCV: 93 fL (ref 78.0–100.0)
Platelets: 130 10*3/uL — ABNORMAL LOW (ref 150–400)
RBC: 2.7 MIL/uL — ABNORMAL LOW (ref 4.22–5.81)
RDW: 12.1 % (ref 11.5–15.5)
WBC: 8.2 10*3/uL (ref 4.0–10.5)

## 2016-10-18 LAB — GLUCOSE, CAPILLARY: Glucose-Capillary: 118 mg/dL — ABNORMAL HIGH (ref 65–99)

## 2016-10-18 MED ORDER — LACTULOSE 10 GM/15ML PO SOLN
20.0000 g | Freq: Once | ORAL | Status: AC
  Administered 2016-10-18: 20 g via ORAL
  Filled 2016-10-18: qty 30

## 2016-10-18 NOTE — Progress Notes (Signed)
Pt doing very well post-operatively from CABG. He is in good spirits. A-paced at 80 bpm. He has chronic sinus bradycardia with baseline HR in the 45-50 range over time.   Appreciate Dr Lucianne Lei Trigt's care. I will arrange post-CABG follow-up in the office.   [name] 10/18/2016 7:27 AM

## 2016-10-18 NOTE — Discharge Summary (Signed)
Physician Discharge Summary       [address]       [place] [address]             [phone]    Patient ID: Richard Ellis MRN: [id number] DOB/AGE: 04/12/1946 71 y.o.  Admit date: 10/15/2016 Discharge date: 10/20/2016  Admission Diagnoses: 1. Coronary artery disease 2. Patent foramen ovale  Active Diagnoses:  1. Hyperlipidemia 2. Cryptogenic stroke ([place]) 3. Chronic bronchitis ([place]) 4. Chronic lower back pain 5. Hypothyroidism 6. Esophageal stricture 7. Anemia   Procedure (s):  1. Coronary artery bypass grafting x2 (left internal mammary artery to     LAD, saphenous vein graft to 1st diagonal). 2. Endoscopic harvest of right greater saphenous vein. 3. Closure of patent foramen ovale with interrupted sutures by Dr. Prescott Gum on 10/15/2016.  History of Presenting Illness: This is a 71 year old active Caucasian male with family history positive for CAD presents with recurrent angina with exertion 71 year after PCI of the mid LAD. Coronary angiogram shows high-grade proximal stenosis at the bifurcation with a large diagonal. Attempt at PCI was unsuccessful due to the calcified structure of the vessel and CABG was recommended.  The patient also has a known history of PFO with a cryptogenic stroke in 2014 on chronic Plavix.  Echo shows normal LV function with no significant valvular abnormalities. Surgical coronary revascularization with combined closure of PFO has been recommended after Plavix washout.  I have discussed the procedure in detail with the patient and his wife. We will plan left internal mammary graft to the LAD, saphenous vein graft to the diagonal, and direct closure of the PFO. He discussed potential risks, benefits, and complications of the surgery with the patient and he agreed to proceed with surgery. Pre operative carotid duplex showed no significant internal carotid artery stenosis bilaterally. He underwent a CABG x 2, closure of PFO on  10/15/2016.  Brief Hospital Course:  The patient was extubated late the evening of surgery without difficulty. He remained afebrile and hemodynamically stable. He was initially AAI paced with underlying bradycardia. This did resolve in time and he was later started on Lopressor. Gordy Councilman, a line, and foley were removed early in the post operative course.  One chest tube did remain until 02/02 secondary to increased output and then it was removed. He was volume over loaded and diuresed. He had ABL anemia. He did not require a post op transfusion. Last H and H was 8.3/24.7 . He had thrombocytopenia as well. His last platelet count was up to 153k. He was weaned off the insulin drip.  The patient's HGA1C pre op was  5.6. The patient was felt surgically stable for transfer from the ICU to PCTU for further convalescence on 2/1//2018. He continues to progress with cardiac rehab. He required 2 liters of oxygen via [place] but was later weaned to room air. He has been tolerating a diet and has had a bowel movement. Epicardial pacing wires were removed on 10/20/2016. Chest tube sutures will be removed the day of discharge. The patient is felt surgically stable for discharge today.    Latest Vital Signs: Blood pressure 110/73, pulse 63, temperature 98.7 F (37.1 C), temperature source Oral, resp. rate 16, height 6\' 1"  (1.854 m), weight 80.9 kg (178 lb 5.6 oz), SpO2 98 %.  Physical Exam: Cardiovascular: RRR Pulmonary: Slightly diminished at bases Abdomen: Soft, non tender, bowel sounds present. Extremities: No lower extremity edema. Wounds: Sternal wound is mostly clean  and dry.  No erythema or signs of infection. RLE wound is clean and dry.  Discharge Condition:Stable and discharged to home.  Recent laboratory studies:  Lab Results  Component Value Date   WBC 6.9 10/19/2016   HGB 8.3 (L) 10/19/2016   HCT 24.7 (L) 10/19/2016   MCV 93.6 10/19/2016   PLT 153 10/19/2016   Lab Results  Component Value Date    NA 135 10/19/2016   K 3.9 10/19/2016   CL 98 (L) 10/19/2016   CO2 26 10/19/2016   CREATININE 0.84 10/19/2016   GLUCOSE 103 (H) 10/19/2016    Diagnostic Studies:   CLINICAL DATA:  CABG.  EXAM: CHEST  2 VIEW  COMPARISON:  10/18/2016.  FINDINGS: Prior CABG. Cardiomegaly. Left lower lobe infiltrate with small left pleural effusion. No interim change. No pneumothorax.  IMPRESSION: 1. Prior CABG.  Cardiomegaly.  No pulmonary venous congestion.  2. Left lower lobe infiltrate with small left pleural effusion. No interim change .   Electronically Signed   By: Marcello Moores  Register   On: 10/19/2016 07:25 Discharge Instructions    Amb Referral to Cardiac Rehabilitation    Complete by:  As directed    Runner. Would like to get in as soon as OK with cardiologist and surgeon   Diagnosis:  CABG   CABG X ___:  2 Comment - PFO closure   Discharge patient    Complete by:  As directed    Discharge disposition:  01-Home or Self Care   Discharge patient date:  10/20/2016     Discharge Medications: Allergies as of 10/20/2016      Reactions   Atorvastatin Other (See Comments)   Muscle weakness   Crestor [rosuvastatin]    Muscle weakness   Demerol [meperidine] Nausea Only   Repatha [evolocumab]    Muscle weakness   Niacin And Related Itching, Rash, Other (See Comments)   Palms turned red and itched      Medication List    STOP taking these medications   FISH OIL ULTRA 1400 MG Caps   nitroGLYCERIN 0.4 MG SL tablet Commonly known as:  NITROSTAT   Turmeric 500 MG Tabs   vitamin C 500 MG tablet Commonly known as:  ASCORBIC ACID     TAKE these medications   acetaminophen 500 MG tablet Commonly known as:  TYLENOL Take 1,000 mg by mouth 2 (two) times daily.   aspirin 81 MG EC tablet Take 1 tablet (81 mg total) by mouth daily. What changed:  medication strength  how much to take   Cholecalciferol 2000 units Caps Take 2,000 Units by mouth every evening.   ferrous  [id number] C-folic acid capsule Commonly known as:  TRINSICON / FOLTRIN Take 1 capsule by mouth 3 (three) times daily after meals.   furosemide 40 MG tablet Commonly known as:  LASIX Take 1 tablet (40 mg total) by mouth daily.   gabapentin 100 MG capsule Commonly known as:  NEURONTIN TAKE ONE CAPSULE BY MOUTH TWICE A DAY What changed:  See the new instructions.   levothyroxine 25 MCG tablet Commonly known as:  SYNTHROID, LEVOTHROID Take 1 tablet (25 mcg total) by mouth daily before breakfast.   oxyCODONE 5 MG immediate release tablet Commonly known as:  Oxy IR/ROXICODONE Take 1 tablet (5 mg total) by mouth every 6 (six) hours as needed for severe pain.   potassium chloride SA 20 MEQ tablet Commonly known as:  K-DUR,KLOR-CON Take 1 tablet (20 mEq total) by mouth daily.  The patient has been discharged on:   1.Beta Blocker:  Yes [   ]                              No   [  x ]                              If No, reason: Bradycardia  2.Ace Inhibitor/ARB: Yes [   ]                                     No  [  x  ]                                     If No, reason:  3.Statin:   Yes [   ]                  No  [ x  ]                  If No, reason: Allergy  4.[place]a [place]:  Yes  [ x  ]                  No   [   ]                  If No, reason: Follow Up Appointments: Follow-up Information    Tharon Aquas Trigt III, MD Follow up on 11/21/2016.   Specialty:  Cardiothoracic Surgery Why:  PA/LAT CXR to be taken (at [facility] which is in the same building as Dr. Lucianne Lei Trigt's office) on at;Appointment time is at  Contact information: [address] [place] [address] [phone]        [name], PA-C Follow up on 11/05/2016.   Specialties:  Cardiology, Physician Assistant Why:  Appointment time is at 9:45 am Contact information: [address] [place] [address] [phone]        [name], MD. Call in 1  day(s).   Specialty:  Internal Medicine Contact information: [place] [address] [phone]           Signed: Terance Hart ContePA-C 10/20/2016, 11:40 AM

## 2016-10-18 NOTE — Progress Notes (Signed)
Chest Tubes d/c'd per order and per protocol. Pt tolerated fair. Call bell and phone within reach. Will continue to monitor.

## 2016-10-18 NOTE — Progress Notes (Addendum)
      [address]       [place] [address]             [phone]        3 Days Post-Op Procedure(s) (LRB): CORONARY ARTERY BYPASS GRAFTING (CABG), ON PUMP, TIMES TWO, USING LEFT INTERNAL MAMMARY ARTERY AND RIGHT GREATER SAPHENOUS VEIN HARVESTED ENDOSCOPICALLY (N/A) TRANSESOPHAGEAL ECHOCARDIOGRAM (TEE) (N/A) REPAIR OF PATENT FORAMEN OVALE (N/A)  Subjective: Patient passing flatus but no bowel movement yet.  Objective: Vital signs in last 24 hours: Temp:  [98.2 F (36.8 C)-99 F (37.2 C)] 98.8 F (37.1 C) (02/01 0605) Pulse Rate:  [52-80] 80 (02/01 0605) Cardiac Rhythm: Atrial paced (01/31 2130) Resp:  [9-24] 18 (02/01 0605) BP: (91-118)/(65-78) 113/73 (02/01 0605) SpO2:  [91 %-100 %] 99 % (02/01 0605) Weight:  [188 lb 9.6 oz (85.5 kg)] 188 lb 9.6 oz (85.5 kg) (02/01 0605)  Pre op weight 83.1 kg Current Weight  10/18/16 188 lb 9.6 oz (85.5 kg)      Intake/Output from previous day: 01/31 0701 - 02/01 0700 In: 300 [P.O.:240; I.V.:60] Out: 2940 [Urine:2580; Chest Tube:360]   Physical Exam:  Cardiovascular: RRR Pulmonary: Slightly diminished at bases Abdomen: Soft, non tender, bowel sounds present. Extremities: Mild bilateral lower extremity edema. Wounds: Aquacel dressing removed. Sternal wound is mostly clean and dry.  No erythema or signs of infection. RLE wound is clean and dry.  Lab Results: CBC: Recent Labs  10/17/16 0400 10/18/16 0257  WBC 9.5 8.2  HGB 8.6* 8.5*  HCT 25.2* 25.1*  PLT 123* 130*   BMET:  Recent Labs  10/17/16 0400 10/18/16 0257  NA 130* 135  K 3.5 4.4  CL 97* 99*  CO2 28 31  GLUCOSE 114* 132*  BUN 10 10  CREATININE 0.72 0.85  CALCIUM 8.0* 8.6*    PT/INR:  Lab Results  Component Value Date   INR 1.33 10/15/2016   INR 1.37 10/15/2016   INR 1.01 10/11/2016   ABG:  INR: Will add last result for INR, ABG once components are confirmed Will add last 4 CBG results once components are  confirmed  Assessment/Plan:  1. CV - A paced this am (underlying HR 40-50's). On Lopressor 12.5 mg bid. Will stop Lopressor for now. 2.  Pulmonary - Chest tube output with 350 cc last 24 hours. Chest tube is to suction, no air leak. On 2 liters of oxygen via [place]. Wean to room air as tolerates. CXR this am appears to show no pneumothorax, small pleural effusions and atelectasis on left base. Chest tube to remain for now. Encourage incentive spirometer. 3. Volume Overload - On Lasix 40 mg daily. 4.  Acute blood loss anemia - H and H 8.5 and 25.1. Continue Trinsicon 5. Thrombocytopenia-platelets up to 130,000.  6. CBGs 120/132/118. Pre op HGA1C 5.6. Will stop accu checks and SS PRN. 7. LOC constipation if no bowel movement later today  ZIMMERMAN,DONIELLE MPA-C 10/18/2016,7:29 AM  Chest x-ray with improved aeration If chest tube output this morning remains minimal we'll discontinue both tubes Temporary pacing reduced to 72/m Patient will not need metoprolol postop because of bradycardia-heart rate 56 sinus without pacemaker patient examined and medical record reviewed,agree with above note. Tharon Aquas Trigt III 10/18/2016

## 2016-10-18 NOTE — Progress Notes (Signed)
CARDIAC REHAB PHASE I   PRE:  Rate/Rhythm: paced 72  BP:  Supine:   Sitting: 117/73  Standing:    SaO2: 100% 2L  MODE:  Ambulation: 550 ft   POST:  Rate/Rhythm: paced 72  BP:  Supine:   Sitting: 122/72  Standing:    SaO2: 98%RA hall, 96%RA room 1025-1102 Pt walked 550 ft on RA with rolling walker and asst x 1 with steady gait. Stopped a couple of times to rest. Sats good on RA so left off oxygen. To recliner with call bell. External pacer , condom cath and chest tube intact. To recliner with call bell. Pt demonstrated 1350 ml on IS.   Graylon Good, RN BSN  10/18/2016 10:58 AM

## 2016-10-19 ENCOUNTER — Inpatient Hospital Stay (HOSPITAL_COMMUNITY): Payer: Medicare Other

## 2016-10-19 LAB — BASIC METABOLIC PANEL
Anion gap: 11 (ref 5–15)
BUN: 13 mg/dL (ref 6–20)
CO2: 26 mmol/L (ref 22–32)
Calcium: 8.7 mg/dL — ABNORMAL LOW (ref 8.9–10.3)
Chloride: 98 mmol/L — ABNORMAL LOW (ref 101–111)
Creatinine, Ser: 0.84 mg/dL (ref 0.61–1.24)
GFR calc Af Amer: >60 mL/min (ref >60)
GFR calc non Af Amer: >60 mL/min (ref >60)
Glucose, Bld: 103 mg/dL — ABNORMAL HIGH (ref 65–99)
Potassium: 3.9 mmol/L (ref 3.5–5.1)
Sodium: 135 mmol/L (ref 135–145)

## 2016-10-19 LAB — CBC
HCT: 24.7 % — ABNORMAL LOW (ref 39.0–52.0)
Hemoglobin: 8.3 g/dL — ABNORMAL LOW (ref 13.0–17.0)
MCH: 31.4 pg (ref 26.0–34.0)
MCHC: 33.6 g/dL (ref 30.0–36.0)
MCV: 93.6 fL (ref 78.0–100.0)
Platelets: 153 10*3/uL (ref 150–400)
RBC: 2.64 MIL/uL — ABNORMAL LOW (ref 4.22–5.81)
RDW: 12.3 % (ref 11.5–15.5)
WBC: 6.9 10*3/uL (ref 4.0–10.5)

## 2016-10-19 LAB — TYPE AND SCREEN
ABO/RH(D): A POS
Antibody Screen: NEGATIVE
Unit division: 0
Unit division: 0

## 2016-10-19 MED ORDER — POTASSIUM CHLORIDE CRYS ER 20 MEQ PO TBCR
20.0000 meq | EXTENDED_RELEASE_TABLET | Freq: Once | ORAL | Status: AC
  Administered 2016-10-19: 20 meq via ORAL
  Filled 2016-10-19: qty 1

## 2016-10-19 MED ORDER — SORBITOL 70 % SOLN
40.0000 mL | Freq: Once | Status: AC
  Administered 2016-10-19: 40 mL via ORAL
  Filled 2016-10-19: qty 60

## 2016-10-19 MED ORDER — POLYETHYLENE GLYCOL 3350 17 G PO PACK
17.0000 g | PACK | Freq: Once | ORAL | Status: AC
  Administered 2016-10-19: 17 g via ORAL
  Filled 2016-10-19: qty 1

## 2016-10-19 NOTE — Care Management Important Message (Signed)
Important Message  Patient Details  Name: Richard Ellis MRN: [id number] Date of Birth: Nov 25, 1945   Medicare Important Message Given:  Yes    Orbie Pyo 10/19/2016, 2:13 PM

## 2016-10-19 NOTE — Progress Notes (Addendum)
      [address]       [place] [address]             [phone]        4 Days Post-Op Procedure(s) (LRB): CORONARY ARTERY BYPASS GRAFTING (CABG), ON PUMP, TIMES TWO, USING LEFT INTERNAL MAMMARY ARTERY AND RIGHT GREATER SAPHENOUS VEIN HARVESTED ENDOSCOPICALLY (N/A) TRANSESOPHAGEAL ECHOCARDIOGRAM (TEE) (N/A) REPAIR OF PATENT FORAMEN OVALE (N/A)  Subjective: Patient has not had a bowel movement yet.  Objective: Vital signs in last 24 hours: Temp:  [98.2 F (36.8 C)-98.8 F (37.1 C)] 98.4 F (36.9 C) (02/02 0537) Pulse Rate:  [62-78] 62 (02/02 0537) Cardiac Rhythm: Normal sinus rhythm;Atrial paced (02/01 1900) Resp:  [16-18] 18 (02/02 0537) BP: (107-130)/(66-68) 130/66 (02/02 0537) SpO2:  [96 %-100 %] 100 % (02/02 0537) Weight:  [185 lb 4.8 oz (84.1 kg)] 185 lb 4.8 oz (84.1 kg) (02/02 0537)  Pre op weight 83.1 kg Current Weight  10/19/16 185 lb 4.8 oz (84.1 kg)      Intake/Output from previous day: 02/01 0701 - 02/02 0700 In: 600 [P.O.:600] Out: 2950 [Urine:2700; Chest Tube:250]   Physical Exam:  Cardiovascular: RRR Pulmonary: Slightly diminished at bases Abdomen: Soft, non tender, bowel sounds present. Extremities: No lower extremity edema. Wounds: Sternal wound is mostly clean and dry.  No erythema or signs of infection. RLE wound is clean and dry.  Lab Results: CBC:  Recent Labs  10/18/16 0257 10/19/16 0345  WBC 8.2 6.9  HGB 8.5* 8.3*  HCT 25.1* 24.7*  PLT 130* 153   BMET:   Recent Labs  10/18/16 0257 10/19/16 0345  NA 135 135  K 4.4 3.9  CL 99* 98*  CO2 31 26  GLUCOSE 132* 103*  BUN 10 13  CREATININE 0.85 0.84  CALCIUM 8.6* 8.7*    PT/INR:  Lab Results  Component Value Date   INR 1.33 10/15/2016   INR 1.37 10/15/2016   INR 1.01 10/11/2016   ABG:  INR: Will add last result for INR, ABG once components are confirmed Will add last 4 CBG results once components are confirmed  Assessment/Plan:  1. CV - SR  60's. BB stopped yesterday. On back up external pacer. Will disconnect later this am. 2.  Pulmonary -  On 1 liter of oxygen via [place]. Wean to room air as tolerates. CXR this am appears to show no pneumothorax, small pleural effusions and atelectasis on left base.Encourage incentive spirometer. 3. Volume Overload - On Lasix 40 mg daily. 4.  Acute blood loss anemia - H and H 8.3 and 24.7. Continue Trinsicon 5. Thrombocytopenia resolved -platelets up to 153,000.  6. Supplement potassium 7. LOC constipation 8. Remove EPW in AM 9. Likely discharge in am  ZIMMERMAN,DONIELLE MPA-C 10/19/2016,7:21 AM Remove EPWs tomorrow patient examined and medical record reviewed,agree with above note. Tharon Aquas Trigt III 10/19/2016

## 2016-10-19 NOTE — Progress Notes (Signed)
CARDIAC REHAB PHASE I   PRE:  Rate/Rhythm: 84 SR  :  Up in hall with wife with rolling walker     MODE:  Ambulation: 1230 ft   POST:  Rate/Rhythm: 73 SR  BP:  Supine:   Sitting: 112/59  Standing:    SaO2: 98%RA 1052-1128 Pt up in hall walking with wife with rolling walker. Gait steady. Tolerating well. Does not need rolling walker for home use. Education completed with pt and wife who voiced understanding. Wife with many pertinent questions. Discussed heart healthy diet and being aware of carbs with A1C at 5.6.. Has been at 5.8 2 years ago. Wrote down how to view discharge video. Discussed CRP 2 and referring to [place].  Encouraged IS and sternal precautions.   Graylon Good, RN BSN  10/19/2016 11:23 AM

## 2016-10-20 MED ORDER — POTASSIUM CHLORIDE CRYS ER 20 MEQ PO TBCR: 20.0000 meq | EXTENDED_RELEASE_TABLET | Freq: Every day | ORAL | 0 refills | Status: DC

## 2016-10-20 MED ORDER — FE FUMARATE-B12-VIT C-FA-IFC PO CAPS: 1.0000 | ORAL_CAPSULE | Freq: Three times a day (TID) | ORAL | 1 refills | Status: DC

## 2016-10-20 MED ORDER — OXYCODONE HCL 5 MG PO TABS
5.0000 mg | ORAL_TABLET | Freq: Four times a day (QID) | ORAL | 0 refills | Status: DC | PRN
Start: 2016-10-20 — End: 2016-11-13

## 2016-10-20 MED ORDER — ASPIRIN 81 MG PO TBEC: 81.0000 mg | DELAYED_RELEASE_TABLET | Freq: Every day | ORAL | Status: DC

## 2016-10-20 MED ORDER — FUROSEMIDE 40 MG PO TABS: 40.0000 mg | ORAL_TABLET | Freq: Every day | ORAL | 0 refills | Status: DC

## 2016-10-20 MED ORDER — LACTULOSE 10 GM/15ML PO SOLN: 10.0000 g | Freq: Two times a day (BID) | ORAL | Status: DC | PRN

## 2016-10-20 NOTE — Discharge Instructions (Signed)

## 2016-10-20 NOTE — Progress Notes (Signed)
10/20/2016 0930 EPW D/C'd per order and per protocol.  Ends intact. Pt. Tolerated well.  Advised bedrest x1hr.  Call bell in reach.  Vital signs collected per protocol.

## 2016-10-20 NOTE — Progress Notes (Addendum)
      [address]       [place] [address]             [phone]      5 Days Post-Op Procedure(s) (LRB): CORONARY ARTERY BYPASS GRAFTING (CABG), ON PUMP, TIMES TWO, USING LEFT INTERNAL MAMMARY ARTERY AND RIGHT GREATER SAPHENOUS VEIN HARVESTED ENDOSCOPICALLY (N/A) TRANSESOPHAGEAL ECHOCARDIOGRAM (TEE) (N/A) REPAIR OF PATENT FORAMEN OVALE (N/A) Subjective: Feels good this morning. Able to walk in the halls with short breaks  Objective: Vital signs in last 24 hours: Temp:  [98.3 F (36.8 C)-98.7 F (37.1 C)] 98.7 F (37.1 C) (02/03 0636) Pulse Rate:  [71-78] 72 (02/03 0636) Cardiac Rhythm: Normal sinus rhythm (02/02 2000) Resp:  [18] 18 (02/03 0636) BP: (116-135)/(70-83) 135/83 (02/03 0636) SpO2:  [94 %-98 %] 98 % (02/03 0636) Weight:  [80.9 kg (178 lb 5.6 oz)] 80.9 kg (178 lb 5.6 oz) (02/03 0636)     Intake/Output from previous day: 02/02 0701 - 02/03 0700 In: 480 [P.O.:480] Out: 4150 [Urine:4150] Intake/Output this shift: No intake/output data recorded.  General appearance: alert, cooperative and no distress Heart: regular rate and rhythm, S1, S2 normal, no murmur, click, rub or gallop Lungs: clear to auscultation bilaterally Abdomen: soft, non-tender; bowel sounds normal; no masses,  no organomegaly Extremities: extremities normal, atraumatic, no cyanosis or edema Wound: clean and dry  Lab Results:  Recent Labs  10/18/16 0257 10/19/16 0345  WBC 8.2 6.9  HGB 8.5* 8.3*  HCT 25.1* 24.7*  PLT 130* 153   BMET:  Recent Labs  10/18/16 0257 10/19/16 0345  NA 135 135  K 4.4 3.9  CL 99* 98*  CO2 31 26  GLUCOSE 132* 103*  BUN 10 13  CREATININE 0.85 0.84  CALCIUM 8.6* 8.7*    PT/INR: No results for input(s): LABPROT, INR in the last 72 hours. ABG    Component Value Date/Time   PHART 7.352 10/15/2016 2339   HCO3 24.0 10/15/2016 2339   TCO2 27 10/16/2016 1634   ACIDBASEDEF 1.0 10/15/2016 2339   O2SAT 94.0 10/15/2016 2339   CBG (last  3)   Recent Labs  10/17/16 1633 10/17/16 2051 10/18/16 0558  GLUCAP 120* 132* 118*    Assessment/Plan: S/P Procedure(s) (LRB): CORONARY ARTERY BYPASS GRAFTING (CABG), ON PUMP, TIMES TWO, USING LEFT INTERNAL MAMMARY ARTERY AND RIGHT GREATER SAPHENOUS VEIN HARVESTED ENDOSCOPICALLY (N/A) TRANSESOPHAGEAL ECHOCARDIOGRAM (TEE) (N/A) REPAIR OF PATENT FORAMEN OVALE (N/A)  1. CV - SR 70's.  Good blood pressure. Holding BB for h/o bradycardia. D/C EPW 2.  Pulmonary - Tolerating room air with good oxygen saturation. CXR yesterday appears to show no pneumothorax, small pleural effusions and atelectasis on left base.Encourage incentive spirometer. 3. Volume Overload - On Lasix 40 mg daily. 4.  Acute blood loss anemia - H and H stable. Continue Trinsicon 5. Thrombocytopenia resolved -platelets up to 153,000.  6. Possible discharge later today.   LOS: 5 days    Elgie Collard 10/20/2016 patient examined and medical record reviewed,agree with above note. Tharon Aquas Trigt III 10/20/2016

## 2016-10-20 NOTE — Progress Notes (Signed)
10/20/2016  0940 Chest tube sutures discontinued.  Pt tolerated well. [name]

## 2016-10-20 NOTE — Progress Notes (Signed)
10/20/2016 1:53 PM Discharge AVS meds taken today and those due this evening reviewed.  Follow-up appointments and when to call md reviewed.  D/C IV and TELE.  Questions and concerns addressed.   D/C home per orders. [name]

## 2016-10-20 NOTE — Progress Notes (Signed)
10/19/2016 1800 Pt has been up walking the unit several times today on RA and without a walker.  Tolerated without any difficulty. [name]

## 2016-10-23 ENCOUNTER — Other Ambulatory Visit: Payer: Self-pay | Admitting: *Deleted

## 2016-10-23 ENCOUNTER — Telehealth (HOSPITAL_COMMUNITY): Payer: Self-pay | Admitting: Physician Assistant

## 2016-10-23 DIAGNOSIS — D649 Anemia, unspecified: Secondary | ICD-10-CM

## 2016-10-23 MED ORDER — VITAMIN C 500 MG PO TABS: 500.0000 mg | ORAL_TABLET | Freq: Every day | ORAL | 0 refills | Status: DC

## 2016-10-23 MED ORDER — VITAMIN B-12 1000 MCG PO TABS: 1000.0000 ug | ORAL_TABLET | Freq: Every day | ORAL | 0 refills | Status: DC

## 2016-10-23 MED ORDER — FOLIC ACID 1 MG PO TABS: 1.0000 mg | ORAL_TABLET | Freq: Every day | ORAL | 0 refills | Status: DC

## 2016-10-23 MED ORDER — FERROUS FUMARATE 324 (106 FE) MG PO TABS: 1.0000 | ORAL_TABLET | Freq: Every day | ORAL | 0 refills | Status: DC

## 2016-10-23 NOTE — Telephone Encounter (Signed)
      [address]       [place] [address]             [phone]    Rc C Isley [id number]   S/P CABG x 2 and PFO closure  by Dr. Prescott Gum .  Discharged home on 10/20/2016.   Medications: Current Outpatient Prescriptions on File Prior to Visit  Medication Sig Dispense Refill  . acetaminophen (TYLENOL) 500 MG tablet Take 1,000 mg by mouth 2 (two) times daily.    [name] aspirin EC 81 MG EC tablet Take 1 tablet (81 mg total) by mouth daily.    . Cholecalciferol 2000 UNITS CAPS Take 2,000 Units by mouth every evening.     . Ferrous Fumarate (HEMOCYTE - 106 MG FE) 324 (106 Fe) MG TABS tablet Take 1 tablet (106 mg of iron total) by mouth daily. 30 tablet 0  . folic acid (FOLVITE) 1 MG tablet Take 1 tablet (1 mg total) by mouth daily. 30 tablet 0  . furosemide (LASIX) 40 MG tablet Take 1 tablet (40 mg total) by mouth daily. 7 tablet 0  . gabapentin (NEURONTIN) 100 MG capsule TAKE ONE CAPSULE BY MOUTH TWICE A DAY 60 capsule 0  . levothyroxine (SYNTHROID, LEVOTHROID) 25 MCG tablet Take 1 tablet (25 mcg total) by mouth daily before breakfast. 90 tablet 3  . oxyCODONE (OXY IR/ROXICODONE) 5 MG immediate release tablet Take 1 tablet (5 mg total) by mouth every 6 (six) hours as needed for severe pain. 30 tablet 0  . potassium chloride SA (K-DUR,KLOR-CON) 20 MEQ tablet Take 1 tablet (20 mEq total) by mouth daily. 7 tablet 0  . vitamin B-12 (CYANOCOBALAMIN) 1000 MCG tablet Take 1 tablet (1,000 mcg total) by mouth daily. 30 tablet 0  . vitamin C (ASCORBIC ACID) 500 MG tablet Take 1 tablet (500 mg total) by mouth daily. 30 tablet 0   No current facility-administered medications on file prior to visit.    Coumadin:  INR check Yes/No  Problems/Concerns: none.  Assessment:  Patient is doing well .  He shares that he has not taken any pain medication and feels fine. Occasionally he has some pain at night but that is it.  Contact office if concerns or problems develop. He did share that the  Trinsicon was discontinued and he did not receive a prescription. He does however take a multivitamin at home which likely has all the vitamins and minerals he needs to help his post-op anemia resolve. He knows about his cardiology appointment and scheduled an appointment with his PCP for next week. He is aware of his appointment with our office and had no other questions at this time.   Follow up Appointment:   Tharon Aquas Trigt III, MD Follow up on 11/21/2016.   Specialty:  Cardiothoracic Surgery Why:  PA/LAT CXR to be taken (at [facility] which is in the same building as Dr. Lucianne Lei Trigt's office) on at;Appointment time is at  Contact information: [place] [address] [place], PA-C

## 2016-10-29 ENCOUNTER — Ambulatory Visit (INDEPENDENT_AMBULATORY_CARE_PROVIDER_SITE_OTHER): Payer: Federal, State, Local not specified - PPO | Admitting: Internal Medicine

## 2016-10-29 ENCOUNTER — Encounter: Payer: Self-pay | Admitting: Internal Medicine

## 2016-10-29 DIAGNOSIS — D500 Iron deficiency anemia secondary to blood loss (chronic): Secondary | ICD-10-CM

## 2016-10-29 DIAGNOSIS — Q211 Atrial septal defect: Secondary | ICD-10-CM | POA: Diagnosis not present

## 2016-10-29 DIAGNOSIS — Q2112 Patent foramen ovale: Secondary | ICD-10-CM

## 2016-10-29 DIAGNOSIS — J4520 Mild intermittent asthma, uncomplicated: Secondary | ICD-10-CM

## 2016-10-29 NOTE — Assessment & Plan Note (Signed)
Repaired 09/2016

## 2016-10-29 NOTE — Assessment & Plan Note (Signed)
Restart MDI

## 2016-10-29 NOTE — Progress Notes (Signed)
Subjective:  Patient ID: Richard Ellis, male    DOB: 1946-04-09  Age: 71 y.o. MRN: [id number]  CC: Hospitalization Follow-up (CAD bypass, Angina)   HPI Richard Ellis presents for CAD, dyslipidemia, B12 def. S/p CABG. F/u anemia  Outpatient Medications Prior to Visit  Medication Sig Dispense Refill  . acetaminophen (TYLENOL) 500 MG tablet Take 1,000 mg by mouth 2 (two) times daily.    [name] aspirin EC 81 MG EC tablet Take 1 tablet (81 mg total) by mouth daily.    . Cholecalciferol 2000 UNITS CAPS Take 2,000 Units by mouth every evening.     . Ferrous Fumarate (HEMOCYTE - 106 MG FE) 324 (106 Fe) MG TABS tablet Take 1 tablet (106 mg of iron total) by mouth daily. 30 tablet 0  . folic acid (FOLVITE) 1 MG tablet Take 1 tablet (1 mg total) by mouth daily. 30 tablet 0  . gabapentin (NEURONTIN) 100 MG capsule TAKE ONE CAPSULE BY MOUTH TWICE A DAY 60 capsule 0  . levothyroxine (SYNTHROID, LEVOTHROID) 25 MCG tablet Take 1 tablet (25 mcg total) by mouth daily before breakfast. 90 tablet 3  . oxyCODONE (OXY IR/ROXICODONE) 5 MG immediate release tablet Take 1 tablet (5 mg total) by mouth every 6 (six) hours as needed for severe pain. 30 tablet 0  . vitamin B-12 (CYANOCOBALAMIN) 1000 MCG tablet Take 1 tablet (1,000 mcg total) by mouth daily. 30 tablet 0  . vitamin C (ASCORBIC ACID) 500 MG tablet Take 1 tablet (500 mg total) by mouth daily. 30 tablet 0  . furosemide (LASIX) 40 MG tablet Take 1 tablet (40 mg total) by mouth daily. 7 tablet 0  . potassium chloride SA (K-DUR,KLOR-CON) 20 MEQ tablet Take 1 tablet (20 mEq total) by mouth daily. 7 tablet 0   No facility-administered medications prior to visit.     ROS Review of Systems  Constitutional: Positive for fatigue. Negative for appetite change and unexpected weight change.  HENT: Negative for congestion, nosebleeds, sneezing, sore throat and trouble swallowing.   Eyes: Negative for itching and visual disturbance.  Respiratory: Negative for cough.     Cardiovascular: Positive for chest pain. Negative for palpitations and leg swelling.  Gastrointestinal: Negative for abdominal distention, blood in stool, diarrhea and nausea.  Genitourinary: Negative for frequency and hematuria.  Musculoskeletal: Negative for back pain, gait problem, joint swelling and neck pain.  Skin: Negative for rash.  Neurological: Negative for dizziness, tremors, speech difficulty and weakness.  Psychiatric/Behavioral: Negative for agitation, dysphoric mood and sleep disturbance. The patient is not nervous/anxious.     Objective:  BP 108/64   Pulse 63   Temp 97.8 F (36.6 C) (Oral)   Resp 16   Ht 6\' 1"  (1.854 m)   Wt 177 lb (80.3 kg)   SpO2 97%   BMI 23.35 kg/m   BP Readings from Last 3 Encounters:  10/29/16 108/64  10/20/16 133/72  10/11/16 (!) 126/54    Wt Readings from Last 3 Encounters:  10/29/16 177 lb (80.3 kg)  10/20/16 178 lb 5.6 oz (80.9 kg)  10/11/16 183 lb 4.8 oz (83.1 kg)    Physical Exam  Constitutional: He is oriented to person, place, and time. He appears well-developed. No distress.  NAD  HENT:  Mouth/Throat: Oropharynx is clear and moist.  Eyes: Conjunctivae are normal. Pupils are equal, round, and reactive to light.  Neck: Normal range of motion. No JVD present. No thyromegaly present.  Cardiovascular: Normal rate, regular rhythm, normal heart sounds  and intact distal pulses.  Exam reveals no gallop and no friction rub.   No murmur heard. Pulmonary/Chest: Effort normal and breath sounds normal. No respiratory distress. He has no wheezes. He has no rales. He exhibits no tenderness.  Abdominal: Soft. Bowel sounds are normal. He exhibits no distension and no mass. There is no tenderness. There is no rebound and no guarding.  Musculoskeletal: Normal range of motion. He exhibits no edema or tenderness.  Lymphadenopathy:    He has no cervical adenopathy.  Neurological: He is alert and oriented to person, place, and time. He has  normal reflexes. No cranial nerve deficit. He exhibits normal muscle tone. He displays a negative Romberg sign. Coordination and gait normal.  Skin: Skin is warm and dry. No rash noted.  Psychiatric: He has a normal mood and affect. His behavior is normal. Judgment and thought content normal.  chest scar RLE small scar  Lab Results  Component Value Date   WBC 6.9 10/19/2016   HGB 8.3 (L) 10/19/2016   HCT 24.7 (L) 10/19/2016   PLT 153 10/19/2016   GLUCOSE 103 (H) 10/19/2016   CHOL 122 (L) 04/18/2016   TRIG 115 04/18/2016   HDL 52 04/18/2016   LDLDIRECT 122.0 05/09/2015   LDLCALC 47 04/18/2016   ALT 20 10/11/2016   AST 27 10/11/2016   NA 135 10/19/2016   K 3.9 10/19/2016   CL 98 (L) 10/19/2016   CREATININE 0.84 10/19/2016   BUN 13 10/19/2016   CO2 26 10/19/2016   TSH 1.88 04/19/2016   PSA 4.83 (H) 04/19/2016   INR 1.33 10/15/2016   HGBA1C 5.6 10/11/2016    No results found.  Assessment & Plan:   There are no diagnoses linked to this encounter. I have discontinued Mr. Lorenzi's furosemide and potassium chloride SA. I am also having him maintain his Cholecalciferol, levothyroxine, acetaminophen, gabapentin, aspirin, oxyCODONE, Ferrous Fumarate, vitamin 0000000, folic acid, and vitamin C.  No orders of the defined types were placed in this encounter.    Follow-up: No Follow-up on file.  [name], MD

## 2016-11-05 ENCOUNTER — Encounter: Payer: Federal, State, Local not specified - PPO | Admitting: Physician Assistant

## 2016-11-05 ENCOUNTER — Ambulatory Visit (INDEPENDENT_AMBULATORY_CARE_PROVIDER_SITE_OTHER): Payer: Federal, State, Local not specified - PPO | Admitting: Cardiovascular Disease

## 2016-11-05 ENCOUNTER — Encounter: Payer: Self-pay | Admitting: Cardiovascular Disease

## 2016-11-05 VITALS — BP 102/60 | HR 78 | Ht 73.5 in | Wt 175.2 lb

## 2016-11-05 DIAGNOSIS — Q211 Atrial septal defect: Secondary | ICD-10-CM | POA: Diagnosis not present

## 2016-11-05 DIAGNOSIS — Z951 Presence of aortocoronary bypass graft: Secondary | ICD-10-CM | POA: Diagnosis not present

## 2016-11-05 DIAGNOSIS — Q2112 Patent foramen ovale: Secondary | ICD-10-CM

## 2016-11-05 NOTE — Progress Notes (Signed)
Cardiology Office Note Date:  11/07/2016   ID:  Richard Ellis, Richard Ellis 04/23/1946, MRN [id number]  PCP:  [name], MD  Cardiologist:  [name], MD    Chief Complaint  Patient presents with  . Fatigue     History of Present Illness: Richard Ellis is a 71 y.o. male who presents for follow-up of CAD. He underwent PCI last year, but developed severe recurrent angina with exertion and underwent repeat cath/PCI January 23rd. He was found to have severe mid-LAD stenosis just proximal to the previously implanted stent. He underwent extensive angioplasty but never was able to deliver a stent. Because of severe residual LAD stenosis he was referred for CABG which was performed 3-78 without complication. He was revascularized with a LIMA-LAD and SVG-diagonal graft as well as closure of his known PFO.   The patient is here with his wife today. He is doing well now 3 weeks out from surgery. His chest wall discomfort is improving. He is walking every day with some degree of fatigue and shortness of breath. No anginal chest pain. No leg swelling, lightheadedness, palpitations, orthopnea, or PND. He notes that his heart rate is a little higher than it has been in the past as he usually runs in the 50's.   Past Medical History:  Diagnosis Date  . Allergy    rhinitis  . Anemia    "Hgb always on the low side" (07/02/2013)  . CAD (coronary artery disease)    a. LHC 2/17: pLAD 50, mLAD 95, pRCA 30, dRCA 25, EF 50-55% >> PCI:  3 x 18 mm Resolute DES to mLAD   . Chronic bronchitis ([place])    "not in the last year since I started taking allergy shots" (10/09/2016)  . Chronic lower back pain   . Cryptogenic stroke ([place]) 07/02/2013   a. "small ischemic occipital right sided" (07/02/2013)  //  b. Event monitor 10/14: sinus brady  //  c. Carotid US 10/14: bilat ICA 1-39%  . Esophageal stricture   . Heart murmur    "as a child" (07/02/2013)  . History of echocardiogram    a. Echo 10/14: EF 60-65%, no  RWMA, normal diastolic function  . Hyperlipidemia   . Hypothyroidism   . Osteoarthritis    "lower back" (10/09/2016)  . PFO (patent foramen ovale)    a. TEE 11/14: mild LVH, EF 60-65%, small PFO, R-L shunt    Past Surgical History:  Procedure Laterality Date  . CARDIAC CATHETERIZATION N/A 11/10/2015   Procedure: Left Heart Cath and Coronary Angiography;  Surgeon: [name], MD;  Location: [place] [facility];  Service: Cardiovascular;  Laterality: N/A;  . CARDIAC CATHETERIZATION N/A 11/10/2015   Procedure: Coronary Stent Intervention;  Surgeon: [name], MD;  Location: [place] [facility];  Service: Cardiovascular;  Laterality: N/A;  mid lad  3.0x18 resolute  . CARDIAC CATHETERIZATION  10/09/2016   "scheduled OHS for tomorrow" (10/09/2016)  . CARDIAC CATHETERIZATION N/A 10/09/2016   Procedure: Left Heart Cath and Coronary Angiography;  Surgeon: [name], MD;  Location: [place] [facility];  Service: Cardiovascular;  Laterality: N/A;  . CARDIAC CATHETERIZATION N/A 10/09/2016   Procedure: Coronary Balloon Angioplasty;  Surgeon: [name], MD;  Location: [place] [facility];  Service: Cardiovascular;  Laterality: N/A;  . CORONARY ARTERY BYPASS GRAFT N/A 10/15/2016   Procedure: CORONARY ARTERY BYPASS GRAFTING (CABG), ON PUMP, TIMES TWO, USING LEFT INTERNAL MAMMARY ARTERY AND RIGHT GREATER SAPHENOUS VEIN HARVESTED ENDOSCOPICALLY;  Surgeon: Collier Salina  Prescott Gum, MD;  Location: [place];  Service: Open Heart Surgery;  Laterality: N/A;  LIMA to LAD, SVG to DIAGONAL  . ESOPHAGOGASTRODUODENOSCOPY N/A 10/26/2012   Procedure: ESOPHAGOGASTRODUODENOSCOPY (EGD);  Surgeon: Winfield Cunas., MD;  Location: [facility] ENDOSCOPY;  Service: Endoscopy;  Laterality: N/A;  . ESOPHAGOGASTRODUODENOSCOPY N/A 07/08/2013   Procedure: ESOPHAGOGASTRODUODENOSCOPY (EGD);  Surgeon: Winfield Cunas., MD;  Location: [facility];  Service: Endoscopy;  Laterality: N/A;  . INNER EAR SURGERY Left 1957   "related to ear  infection"  . REPAIR OF PATENT FORAMEN OVALE N/A 10/15/2016   Procedure: REPAIR OF PATENT FORAMEN OVALE;  Surgeon: Ivin Poot, MD;  Location: [place];  Service: Open Heart Surgery;  Laterality: N/A;  . SAVORY DILATION N/A 07/08/2013   Procedure: SAVORY DILATION;  Surgeon: Winfield Cunas., MD;  Location: [facility];  Service: Endoscopy;  Laterality: N/A;  . TEE WITHOUT CARDIOVERSION N/A 07/28/2013   Procedure: TRANSESOPHAGEAL ECHOCARDIOGRAM (TEE);  Surgeon: Pixie Casino, MD;  Location: [facility] ENDOSCOPY;  Service: Cardiovascular;  Laterality: N/A;  . TEE WITHOUT CARDIOVERSION N/A 10/15/2016   Procedure: TRANSESOPHAGEAL ECHOCARDIOGRAM (TEE);  Surgeon: Ivin Poot, MD;  Location: [place];  Service: Open Heart Surgery;  Laterality: N/A;  . TONSILLECTOMY  1950's    Current Outpatient Prescriptions  Medication Sig Dispense Refill  . acetaminophen (TYLENOL) 500 MG tablet Take 1,000 mg by mouth 2 (two) times daily.    [name] aspirin EC 81 MG EC tablet Take 1 tablet (81 mg total) by mouth daily.    . Cholecalciferol 2000 UNITS CAPS Take 2,000 Units by mouth every evening.     . Ferrous Fumarate (HEMOCYTE - 106 MG FE) 324 (106 Fe) MG TABS tablet Take 1 tablet (106 mg of iron total) by mouth daily. 30 tablet 0  . folic acid (FOLVITE) 1 MG tablet Take 1 tablet (1 mg total) by mouth daily. 30 tablet 0  . gabapentin (NEURONTIN) 100 MG capsule TAKE ONE CAPSULE BY MOUTH TWICE A DAY 60 capsule 0  . levothyroxine (SYNTHROID, LEVOTHROID) 25 MCG tablet Take 1 tablet (25 mcg total) by mouth daily before breakfast. 90 tablet 3  . oxyCODONE (OXY IR/ROXICODONE) 5 MG immediate release tablet Take 1 tablet (5 mg total) by mouth every 6 (six) hours as needed for severe pain. 30 tablet 0  . vitamin B-12 (CYANOCOBALAMIN) 1000 MCG tablet Take 1 tablet (1,000 mcg total) by mouth daily. 30 tablet 0  . vitamin C (ASCORBIC ACID) 500 MG tablet Take 1 tablet (500 mg total) by mouth daily. 30 tablet 0   No current  facility-administered medications for this visit.     Allergies:   Atorvastatin; Crestor [rosuvastatin]; Demerol [meperidine]; Repatha [evolocumab]; and Niacin and related   Social History:  The patient  reports that he has never smoked. He has never used smokeless tobacco. He reports that he drinks about 4.8 oz of alcohol per week . He reports that he does not use drugs.   Family History:  The patient's  family history includes Coronary artery disease in his other; Heart disease in his father.    ROS:  Please see the history of present illness.  Otherwise, review of systems is positive for hoarse voice.  All other systems are reviewed and negative.    PHYSICAL EXAM: VS:  BP 102/60   Pulse 78   Ht 6' 1.5" (1.867 m)   Wt 175 lb 3.2 oz (79.5 kg)   BMI 22.80 kg/m  , BMI  Body mass index is 22.8 kg/m. GEN: Well nourished, well developed, in no acute distress  HEENT: normal  Neck: no JVD, no masses. No carotid bruits Cardiac: RRR with 2/6 systolic murmur at the apex, no friction rub or diastolic murmur               Respiratory:  clear to auscultation bilaterally, normal work of breathing GI: soft, nontender, nondistended, + BS MS: no deformity or atrophy  Ext: no pretibial edema, pedal pulses 2+= bilaterally Skin: warm and dry, no rash Neuro:  Strength and sensation are intact Psych: euthymic mood, full affect  EKG:  EKG is not ordered today.  Recent Labs: 04/19/2016: TSH 1.88 10/11/2016: ALT 20 10/16/2016: Magnesium 2.1 10/19/2016: BUN 13; Creatinine, Ser 0.84; Hemoglobin 8.3; Platelets 153; Potassium 3.9; Sodium 135   Lipid Panel     Component Value Date/Time   CHOL 122 (L) 04/18/2016 0816   CHOL 230 (H) 11/09/2014 1200   TRIG 115 04/18/2016 0816   TRIG 152 (H) 11/09/2014 1200   TRIG 228 (HH) 07/30/2006 0741   HDL 52 04/18/2016 0816   HDL 64 11/09/2014 1200   CHOLHDL 2.3 04/18/2016 0816   VLDL 23 04/18/2016 0816   LDLCALC 47 04/18/2016 0816   LDLCALC 136 (H) 11/09/2014  1200   LDLDIRECT 122.0 05/09/2015 1006      Wt Readings from Last 3 Encounters:  11/05/16 175 lb 3.2 oz (79.5 kg)  10/29/16 177 lb (80.3 kg)  10/20/16 178 lb 5.6 oz (80.9 kg)     Cardiac Studies Reviewed: 2D Echo 10-10-2016: Study Conclusions  - Left ventricle: The cavity size was normal. Wall thickness was   increased in a pattern of moderate LVH. Systolic function was   normal. The estimated ejection fraction was in the range of 60%   to 65%. LV apical false tendon. Wall motion was normal; there   were no regional wall motion abnormalities. Doppler parameters   are consistent with abnormal left ventricular relaxation (grade 1   diastolic dysfunction). - Aortic valve: Trileaflet. Sclerosis without stenosis. There was   mild regurgitation. - Mitral valve: Mildly thickened leaflets . Systolic bowing without   prolapse. There was trivial regurgitation. - Left atrium: The atrium was moderately dilated. - Right atrium: The atrium was normal in size. - Atrial septum: There was a patent foramen ovale. - Inferior vena cava: The vessel was normal in size. The   respirophasic diameter changes were in the normal range (>= 50%),   consistent with normal central venous pressure.  Impressions:  - Compared to a prior study in 2014, the LVEF is stable. There is   now moderate LVH and moderate LAE. A small PFO was previously   noted by bubble study and is seen today by color doppler.  Cardiac Cath 10-09-2016: Conclusion   1. Severe mid LAD stenosis with complex disease because of heavy calcification and angulation of the vessel 2. Widely patent left main, left circumflex, and right coronary artery with mild diffuse nonobstructive disease 3. Mild segmental LV dysfunction with distal anterior and apical hypokinesis, LVEF estimated at 50%. 4. Unsuccessful PCI of the LAD despite prolonged effort at balloon dilatation using multiple balloons and guidewires, supportive guide catheters and a  Guidezilla, and repeated dilatations with both; compliant and noncompliant balloons. Procedure was unsuccessful because of inability to deliver a stent.  At the completion of the procedure there is a non--flow-limiting dissection present. There is TIMI 3 flow and the patient is chest pain-free. Anticipate  a period of Plavix washout. If the patient is stable tomorrow without symptoms on ambulation, it would be reasonable to allow him to go home with plans for surgical consultation and LIMA to LAD bypass in the near future after Plavix washout.  Indications   Exertional angina (HCC) [I20.8 (ICD-10-CM)]  Procedural Details/Technique   Technical Details INDICATION: Exertional angina. The patient is a 71 year old male with known coronary artery disease. He underwent PCI of the LAD last year. He presents now with severe exertional angina. He has marked changes on his EKG suggestive of anterior ischemia which are new from his previous tracings. He is referred directly for cardiac catheterization because of his highly suggestive history and EKG changes.  PROCEDURAL DETAILS: The right wrist was prepped, draped, and anesthetized with 1% lidocaine. Using the modified Seldinger technique, a 5/6 French Slender sheath was introduced into the right radial artery. 3 mg of verapamil was administered through the sheath, weight-based unfractionated heparin was administered intravenously. Standard Judkins catheters were used for selective coronary angiography and left ventriculography. PCI is performed after the diagnostic procedure. Catheter exchanges were performed over an exchange length guidewire. There were no immediate procedural complications. A TR band was used for radial hemostasis at the completion of the procedure. The patient was transferred to the post catheterization recovery area for further monitoring.   Estimated blood loss <50 mL.  During this procedure the patient was administered the following to  achieve and maintain moderate conscious sedation: Versed 6 mg, Fentanyl 125 mcg, while the patient's heart rate, blood pressure, and oxygen saturation were continuously monitored. The period of conscious sedation was 140 minutes, of which I was present face-to-face 100% of this time.    Coronary Findings   Dominance: Right  Left Main  Vessel is angiographically normal.  Left Anterior Descending  Prox LAD to Mid LAD lesion, 90% stenosed. The lesion is eccentric. The lesion is calcified.  Angioplasty: Angioplasty alone was performed. The pre-interventional distal flow is normal (TIMI 3). The post-interventional distal flow is normal (TIMI 3). The intervention was unsuccessful due to inability to expand the balloon and inability to deliver the stent. At this lesion, a dissection occurred. There is a severe lesion proximal to the stented segment in the mid LAD. This is markedly progressed from the previous study last year. The previously implanted stent is patent with no significant restenosis. Plans were made for intervention. Heparin is used for anticoagulation. An XB LAD 3.5 cm guide catheter is inserted. A cougar wire was advanced beyond the lesion. It is difficult to advance balloons beyond the area of stenosis. 2.0 and 2.5 mm balloons are utilized. The balloons do appear to expand well. I attempted to advance a 3.0 x 20 mm Promus DES but it would not cross the lesion despite a prolonged and aggressive effort. The guide catheter is changed out for a more supportive XB LAD 4.0 cm guide. The same cougar wire is advanced beyond the lesion. I was still unable to advance balloons or stents beyond the area of stenosis in the mid LAD proximal to the previously implanted stent. A grand slam wire is advanced as a buddy wire. Even with a buddy wire in place I could not cross the lesion with a 2.5 or 2.75 mm noncompliant balloon. I was finally able to advance a 2.5 mm semi-compliant balloon and it was inflated to 12  atm. At that point a more deliverable 3.0 x 20 mm Synergy DES was attempted but it would not cross  either on the cougar wire or the grand slam wire. A guidezilla was placed for additional support all the way down into the mid LAD but even with that balloons and stents would no longer cross the lesion. Everything was pulled out and I again changed out for a more supportive guide. An XB LAD 4.5 cm guide catheter was inserted. A new cougar wire was advanced easily beyond the lesion. The lesion was predilated with a 2.5 mm noncompliant balloon to 12 atm. A final attempt was made advancing a Synergy DES but it would not cross either over-the-wire or with the guidezilla to support it. Wires were removed and final angiography was performed.  There is a 60% residual stenosis post intervention.  Mid LAD lesion, 0% stenosed. Previously placed Mid LAD drug eluting stent is widely patent.  Left Circumflex  First Obtuse Marginal Branch  Vessel is large in size.  Ost 1st Mrg to 1st Mrg lesion, 30% stenosed.  Right Coronary Artery  Prox RCA to Mid RCA lesion, 40% stenosed. there is diffuse 40% stenosis of the proximal right coronary artery.  Wall Motion              Left Heart   Left Ventricle The left ventricular size is normal. There is mild left ventricular systolic dysfunction. The left ventricular ejection fraction is 50-55% by visual estimate. There are LV function abnormalities due to segmental dysfunction.    Coronary Diagrams   Diagnostic Diagram     Post-Intervention Diagram       ASSESSMENT AND PLAN: CAD, native vessel: pt progressing well after CABG. Appears to be in sinus rhythm. He's having no angina. He will continue ASA 81 mg. He is intolerant to all lipid lowering therapies. Encouraged him to slowly increase his activity. Cardiac surgical FU scheduled. BP soft for addition of beta blocker and patient with hx of significant resting bradycardia.   PFO: suture closure at time of  cardiac surgery.   Current medicines are reviewed with the patient today.  The patient does not have concerns regarding medicines.  Labs/ tests ordered today include:  No orders of the defined types were placed in this encounter.   Disposition:   FU 3 months  Signed, [name], MD  11/07/2016 10:03 PM    [address], [place], [place]  [address] Phone: [phone]; Fax: [phone]

## 2016-11-05 NOTE — Patient Instructions (Signed)
Medication Instructions:  Your physician recommends that you continue on your current medications as directed. Please refer to the Current Medication list given to you today.  Labwork: No new orders.   Testing/Procedures: No new orders.   Follow-Up: Your physician recommends that you schedule a follow-up appointment in: 3 MONTHS with Dr Cooper   Any Other Special Instructions Will Be Listed Below (If Applicable).     If you need a refill on your cardiac medications before your next appointment, please call your pharmacy.   

## 2016-11-07 ENCOUNTER — Telehealth (HOSPITAL_COMMUNITY): Payer: Self-pay | Admitting: Internal Medicine

## 2016-11-07 NOTE — Telephone Encounter (Signed)
Pulled pt's information through [employer] to [employer] Patient has Medicare A for primary and BCBS [employer] for standard secondary supplemental.  No Co-Pay, Deductible $350.00, pt has met $334.15, Out Of Pocket $5000.00, pt has met $631.67, pt's responsibility is $4368.33 with 35% Co-Insurance, no limits. Reference # [id number]. [place] patient a my chart message with this information.

## 2016-11-13 ENCOUNTER — Ambulatory Visit (INDEPENDENT_AMBULATORY_CARE_PROVIDER_SITE_OTHER): Payer: Self-pay | Admitting: Cardiothoracic Surgery

## 2016-11-13 ENCOUNTER — Other Ambulatory Visit: Payer: Self-pay | Admitting: Cardiothoracic Surgery

## 2016-11-13 VITALS — BP 107/69 | HR 71 | Resp 16 | Ht 72.5 in | Wt 173.0 lb

## 2016-11-13 DIAGNOSIS — Q211 Atrial septal defect: Secondary | ICD-10-CM

## 2016-11-13 DIAGNOSIS — Z951 Presence of aortocoronary bypass graft: Secondary | ICD-10-CM

## 2016-11-13 DIAGNOSIS — Z9889 Other specified postprocedural states: Secondary | ICD-10-CM

## 2016-11-13 DIAGNOSIS — I2510 Atherosclerotic heart disease of native coronary artery without angina pectoris: Secondary | ICD-10-CM

## 2016-11-13 DIAGNOSIS — Z8774 Personal history of (corrected) congenital malformations of heart and circulatory system: Secondary | ICD-10-CM

## 2016-11-13 DIAGNOSIS — Q2112 Patent foramen ovale: Secondary | ICD-10-CM

## 2016-11-13 NOTE — Progress Notes (Signed)
PCP is [name], MD Referring Provider is [name], MD  Chief Complaint  Patient presents with  . Routine Post Op    concerned with reddened area on sternal incision s/p CABG 10/15/16     HPI: Patient presents for wound check in 3 weeks after multivessel CABG with combined closure of patent foramen. He is done well and is walking 45 minutes a day without symptoms. The patient has developed redness of the upper sternal wound incision which is also tender. He denies fever. He denies sternal popping or clicking sensations. Leg incision is well-healed  Past Medical History:  Diagnosis Date  . Allergy    rhinitis  . Anemia    "Hgb always on the low side" (07/02/2013)  . CAD (coronary artery disease)    a. LHC 2/17: pLAD 50, mLAD 95, pRCA 30, dRCA 25, EF 50-55% >> PCI:  3 x 18 mm Resolute DES to mLAD   . Chronic bronchitis ([place])    "not in the last year since I started taking allergy shots" (10/09/2016)  . Chronic lower back pain   . Cryptogenic stroke ([place]) 07/02/2013   a. "small ischemic occipital right sided" (07/02/2013)  //  b. Event monitor 10/14: sinus brady  //  c. Carotid US 10/14: bilat ICA 1-39%  . Esophageal stricture   . Heart murmur    "as a child" (07/02/2013)  . History of echocardiogram    a. Echo 10/14: EF 60-65%, no RWMA, normal diastolic function  . Hyperlipidemia   . Hypothyroidism   . Osteoarthritis    "lower back" (10/09/2016)  . PFO (patent foramen ovale)    a. TEE 11/14: mild LVH, EF 60-65%, small PFO, R-L shunt    Past Surgical History:  Procedure Laterality Date  . CARDIAC CATHETERIZATION N/A 11/10/2015   Procedure: Left Heart Cath and Coronary Angiography;  Surgeon: [name], MD;  Location: [place] [facility];  Service: Cardiovascular;  Laterality: N/A;  . CARDIAC CATHETERIZATION N/A 11/10/2015   Procedure: Coronary Stent Intervention;  Surgeon: [name], MD;  Location: [place] [facility];  Service: Cardiovascular;  Laterality:  N/A;  mid lad  3.0x18 resolute  . CARDIAC CATHETERIZATION  10/09/2016   "scheduled OHS for tomorrow" (10/09/2016)  . CARDIAC CATHETERIZATION N/A 10/09/2016   Procedure: Left Heart Cath and Coronary Angiography;  Surgeon: [name], MD;  Location: [place] [facility];  Service: Cardiovascular;  Laterality: N/A;  . CARDIAC CATHETERIZATION N/A 10/09/2016   Procedure: Coronary Balloon Angioplasty;  Surgeon: [name], MD;  Location: [place] [facility];  Service: Cardiovascular;  Laterality: N/A;  . CORONARY ARTERY BYPASS GRAFT N/A 10/15/2016   Procedure: CORONARY ARTERY BYPASS GRAFTING (CABG), ON PUMP, TIMES TWO, USING LEFT INTERNAL MAMMARY ARTERY AND RIGHT GREATER SAPHENOUS VEIN HARVESTED ENDOSCOPICALLY;  Surgeon: Ivin Poot, MD;  Location: [place];  Service: Open Heart Surgery;  Laterality: N/A;  LIMA to LAD, SVG to DIAGONAL  . ESOPHAGOGASTRODUODENOSCOPY N/A 10/26/2012   Procedure: ESOPHAGOGASTRODUODENOSCOPY (EGD);  Surgeon: Winfield Cunas., MD;  Location: [facility] ENDOSCOPY;  Service: Endoscopy;  Laterality: N/A;  . ESOPHAGOGASTRODUODENOSCOPY N/A 07/08/2013   Procedure: ESOPHAGOGASTRODUODENOSCOPY (EGD);  Surgeon: Winfield Cunas., MD;  Location: [facility];  Service: Endoscopy;  Laterality: N/A;  . INNER EAR SURGERY Left 1957   "related to ear infection"  . REPAIR OF PATENT FORAMEN OVALE N/A 10/15/2016   Procedure: REPAIR OF PATENT FORAMEN OVALE;  Surgeon: Ivin Poot, MD;  Location: [place];  Service: Open Heart  Surgery;  Laterality: N/A;  . SAVORY DILATION N/A 07/08/2013   Procedure: SAVORY DILATION;  Surgeon: Winfield Cunas., MD;  Location: [facility] ENDOSCOPY;  Service: Endoscopy;  Laterality: N/A;  . TEE WITHOUT CARDIOVERSION N/A 07/28/2013   Procedure: TRANSESOPHAGEAL ECHOCARDIOGRAM (TEE);  Surgeon: Pixie Casino, MD;  Location: [facility] ENDOSCOPY;  Service: Cardiovascular;  Laterality: N/A;  . TEE WITHOUT CARDIOVERSION N/A 10/15/2016   Procedure: TRANSESOPHAGEAL ECHOCARDIOGRAM (TEE);   Surgeon: Ivin Poot, MD;  Location: [place];  Service: Open Heart Surgery;  Laterality: N/A;  . TONSILLECTOMY  1950's    Family History  Problem Relation Age of Onset  . Coronary artery disease Other     male first degree relative <60  . Heart disease Father     heart attack  . Colon cancer Neg Hx     Social History Social History  Substance Use Topics  . Smoking status: Never Smoker  . Smokeless tobacco: Never Used  . Alcohol use 4.8 oz/week    4 Glasses of wine, 4 Cans of beer per week     Comment: weekly    Current Outpatient Prescriptions  Medication Sig Dispense Refill  . acetaminophen (TYLENOL) 500 MG tablet Take 1,000 mg by mouth 2 (two) times daily.    [name] aspirin EC 81 MG EC tablet Take 1 tablet (81 mg total) by mouth daily.    . Cholecalciferol 2000 UNITS CAPS Take 2,000 Units by mouth every evening.     . Ferrous Fumarate (HEMOCYTE - 106 MG FE) 324 (106 Fe) MG TABS tablet Take 1 tablet (106 mg of iron total) by mouth daily. 30 tablet 0  . folic acid (FOLVITE) 1 MG tablet Take 1 tablet (1 mg total) by mouth daily. 30 tablet 0  . gabapentin (NEURONTIN) 100 MG capsule TAKE ONE CAPSULE BY MOUTH TWICE A DAY 60 capsule 0  . levothyroxine (SYNTHROID, LEVOTHROID) 25 MCG tablet Take 1 tablet (25 mcg total) by mouth daily before breakfast. 90 tablet 3  . vitamin B-12 (CYANOCOBALAMIN) 1000 MCG tablet Take 1 tablet (1,000 mcg total) by mouth daily. 30 tablet 0  . vitamin C (ASCORBIC ACID) 500 MG tablet Take 1 tablet (500 mg total) by mouth daily. 30 tablet 0   No current facility-administered medications for this visit.     Allergies  Allergen Reactions  . Atorvastatin Other (See Comments)    Muscle weakness  . Crestor [Rosuvastatin]     Muscle weakness  . Demerol [Meperidine] Nausea Only  . Repatha [Evolocumab]     Muscle weakness  . Niacin And Related Itching, Rash and Other (See Comments)    Palms turned red and itched    Review of Systems  No fever No  edema No angina No presyncope Improved appetite and overall strength  BP 107/69 (BP Location: Right Arm, Patient Position: Sitting, Cuff Size: Normal)   Pulse 71   Resp 16   Ht 6' 0.5" (1.842 m)   Wt 173 lb (78.5 kg)   SpO2 98% Comment: ON RA  BMI 23.14 kg/m  Physical Exam Sternal wound with erythema no drainage and mild tenderness    General- alert and comfortable   Lungs- clear without rales, wheezes   Cor- regular rate and rhythm, no murmur , gallop   Abdomen- soft, non-tender   Extremities - warm, non-tender, minimal edema   Neuro- oriented, appropriate, no focal weakness   Diagnostic Tests: No chest x-ray today  Impression: Mild cellulitis versus allergic reaction to subcuticular sutures.  Plan: By mouth Keflex 500 mg 3 times a day prescribed for one week. Patient will return for wound check in one week.   Len Childs, MD [facility] [phone]

## 2016-11-15 ENCOUNTER — Other Ambulatory Visit: Payer: Self-pay | Admitting: Cardiothoracic Surgery

## 2016-11-15 DIAGNOSIS — Z951 Presence of aortocoronary bypass graft: Secondary | ICD-10-CM

## 2016-11-16 LAB — CULTURE, ROUTINE-ABSCESS
Gram Stain: NONE SEEN
Gram Stain: NONE SEEN

## 2016-11-19 ENCOUNTER — Ambulatory Visit (INDEPENDENT_AMBULATORY_CARE_PROVIDER_SITE_OTHER): Payer: Self-pay | Admitting: Physician Assistant

## 2016-11-19 ENCOUNTER — Ambulatory Visit
Admission: RE | Admit: 2016-11-19 | Discharge: 2016-11-19 | Disposition: A | Discharge status: Home / Self Care | Payer: Federal, State, Local not specified - PPO | Source: Ambulatory Visit | Attending: Cardiothoracic Surgery | Admitting: Cardiothoracic Surgery

## 2016-11-19 VITALS — BP 109/71 | HR 80 | Resp 20 | Ht 77.0 in | Wt 173.0 lb

## 2016-11-19 DIAGNOSIS — Q211 Atrial septal defect: Secondary | ICD-10-CM

## 2016-11-19 DIAGNOSIS — I2510 Atherosclerotic heart disease of native coronary artery without angina pectoris: Secondary | ICD-10-CM

## 2016-11-19 DIAGNOSIS — Z951 Presence of aortocoronary bypass graft: Secondary | ICD-10-CM

## 2016-11-19 DIAGNOSIS — Z9889 Other specified postprocedural states: Secondary | ICD-10-CM

## 2016-11-19 DIAGNOSIS — Z8774 Personal history of (corrected) congenital malformations of heart and circulatory system: Secondary | ICD-10-CM

## 2016-11-19 DIAGNOSIS — Q2112 Patent foramen ovale: Secondary | ICD-10-CM

## 2016-11-19 NOTE — Addendum Note (Signed)
Addended by: Nani Skillern on: 11/19/2016 02:37 PM   Modules accepted: Orders

## 2016-11-19 NOTE — Progress Notes (Addendum)
  HPI:  Patient returns for postoperative follow-up of mild cellulitis of the proximal sternal wound. He had coronary artery bypass grafting x 2 and closure of PFO on 10/15/2016. Since hospital discharge the patient reports no further drainage from proximal sternal wound. He also denies fever or chills. His only complaint is sometimes at night, he feels his heart pounding but it is not irregular.  Current Outpatient Prescriptions  Medication Sig Dispense Refill  . acetaminophen (TYLENOL) 500 MG tablet Take 1,000 mg by mouth 2 (two) times daily.    [name] aspirin EC 81 MG EC tablet Take 1 tablet (81 mg total) by mouth daily.    . cephALEXin (KEFLEX) 500 MG capsule Take 500 mg by mouth 3 (three) times daily. X 7 days    . Cholecalciferol 2000 UNITS CAPS Take 2,000 Units by mouth every evening.     . Ferrous Fumarate (HEMOCYTE - 106 MG FE) 324 (106 Fe) MG TABS tablet Take 1 tablet (106 mg of iron total) by mouth daily. 30 tablet 0  . folic acid (FOLVITE) 1 MG tablet Take 1 tablet (1 mg total) by mouth daily. 30 tablet 0  . gabapentin (NEURONTIN) 100 MG capsule TAKE ONE CAPSULE BY MOUTH TWICE A DAY 60 capsule 0  . levothyroxine (SYNTHROID, LEVOTHROID) 25 MCG tablet Take 1 tablet (25 mcg total) by mouth daily before breakfast. 90 tablet 3  . vitamin B-12 (CYANOCOBALAMIN) 1000 MCG tablet Take 1 tablet (1,000 mcg total) by mouth daily. 30 tablet 0  . vitamin C (ASCORBIC ACID) 500 MG tablet Take 1 tablet (500 mg total) by mouth daily. 30 tablet 0  Vital Signs: BP 109/71, HR 80, RR 20, Oxygen saturation 98% on room air   Physical Exam: CV-RRR Pulmonary-Clear to auscultation bilaterally Extremities-No LE edema Wounds-Clean and dry   Impression and Plan: Mr. Steuer has minimal cellulitis of the proximal sternal wound. Of note, Staph Aureus was identified on wound culture. He was instructed to finish his course of antibiotic (Keflex). He has not been taking any narcotocis for pain and he stopped taking  Tylenol. He was instructed he may begin driving short distances (i.e. No more than 30 minutes for the next 5 days.). He may then increase his frequency and duration as tolerates. He was instructed to finish Trinsicon this next week. He wishes to participate in cardiac rehab, which I encouraged him to do. He asked about being able to run and swim. I told him not yet. He still has sternal precautions to follow (i.e. No lifting more than 10 pounds for the next 3 weeks). He may gradually return to normal activities. I instructed him to wait about 3 months to swim. He will return for a wound check in 1-2 weeks.     Nani Skillern, PA-C [facility] [phone]

## 2016-11-28 ENCOUNTER — Other Ambulatory Visit: Payer: Self-pay | Admitting: Family Medicine

## 2016-11-29 ENCOUNTER — Encounter (HOSPITAL_COMMUNITY): Payer: Self-pay

## 2016-11-29 ENCOUNTER — Encounter (HOSPITAL_COMMUNITY)
Admission: RE | Admit: 2016-11-29 | Discharge: 2016-11-29 | Disposition: A | Discharge status: Home / Self Care | Payer: Federal, State, Local not specified - PPO | Source: Ambulatory Visit | Attending: Cardiovascular Disease | Admitting: Cardiovascular Disease

## 2016-11-29 VITALS — Ht 73.0 in | Wt 177.7 lb

## 2016-11-29 DIAGNOSIS — Z951 Presence of aortocoronary bypass graft: Secondary | ICD-10-CM | POA: Diagnosis present

## 2016-11-29 NOTE — Progress Notes (Signed)
Cardiac Rehab Medication Review by a Pharmacist  Does the patient  feel that his/her medications are working for him/her?  Yes   Has the patient been experiencing any side effects to the medications prescribed?  No   Does the patient measure his/her own blood pressure or blood glucose at home?  No   Does the patient have any problems obtaining medications due to transportation or finances?  No   Understanding of regimen: Excellent  Understanding of indications:  Good  Potential of compliance: Excellent    Pharmacist comments:   Patient presents ambulating unassisted and in good spirits today. Medication indications, dosing, frequency, and notable side effects reviewed with patient. Patient verbalized concern regarding increasing back pain with activity. He is planning to use Tylenol and potentially increase his dose of gabapentin to address this. Patient also reports some concern over increased heart rate to  ~70 bpm. He notes "pounding" heartbeat at night. Per patient, HR used to rest ~30 bpm. I encouraged patient to monitor his HR when needed, and report any concerns to his cardiologist. Time offered for discussion with questions. No further questions or concerns at conclusion of our visit.   Argie Ramming, PharmD Pharmacy Resident  Pager [phone] 10/30/16 8:02 AM

## 2016-11-29 NOTE — Progress Notes (Signed)
Cardiac Individual Treatment Plan  Patient Details  Name: Richard Ellis MRN: [id number] Date of Birth: 07-14-1946 Referring Provider:     CARDIAC REHAB PHASE II ORIENTATION from 11/29/2016 in [place]  Referring Provider  [name]      Initial Encounter Date:    CARDIAC REHAB PHASE II ORIENTATION from 11/29/2016 in [place]  Date  (P) 11/29/16  Referring Provider  [name]      Visit Diagnosis: 10/15/16 S/P CABG x 2  Patient's Home Medications on Admission:  Current Outpatient Prescriptions:  .  acetaminophen (TYLENOL) 500 MG tablet, Take 1,000 mg by mouth 2 (two) times daily., Disp: , Rfl:  .  aspirin EC 81 MG EC tablet, Take 1 tablet (81 mg total) by mouth daily., Disp: , Rfl:  .  Cholecalciferol 2000 UNITS CAPS, Take 2,000 Units by mouth every evening. , Disp: , Rfl:  .  gabapentin (NEURONTIN) 100 MG capsule, TAKE ONE CAPSULE BY MOUTH TWICE A DAY, Disp: 60 capsule, Rfl: 0 .  levothyroxine (SYNTHROID, LEVOTHROID) 25 MCG tablet, Take 1 tablet (25 mcg total) by mouth daily before breakfast., Disp: 90 tablet, Rfl: 3 .  vitamin C (ASCORBIC ACID) 500 MG tablet, Take 1 tablet (500 mg total) by mouth daily., Disp: 30 tablet, Rfl: 0 .  folic acid (FOLVITE) 1 MG tablet, Take 1 tablet (1 mg total) by mouth daily. (Patient not taking: Reported on 11/29/2016), Disp: 30 tablet, Rfl: 0 .  vitamin B-12 (CYANOCOBALAMIN) 1000 MCG tablet, Take 1 tablet (1,000 mcg total) by mouth daily. (Patient not taking: Reported on 11/29/2016), Disp: 30 tablet, Rfl: 0  Past Medical History: Past Medical History:  Diagnosis Date  . Allergy    rhinitis  . Anemia    "Hgb always on the low side" (07/02/2013)  . CAD (coronary artery disease)    a. LHC 2/17: pLAD 50, mLAD 95, pRCA 30, dRCA 25, EF 50-55% >> PCI:  3 x 18 mm Resolute DES to mLAD   . Chronic bronchitis ([place])    "not in the last year since I started taking  allergy shots" (10/09/2016)  . Chronic lower back pain   . Cryptogenic stroke ([place]) 07/02/2013   a. "small ischemic occipital right sided" (07/02/2013)  //  b. Event monitor 10/14: sinus brady  //  c. Carotid US 10/14: bilat ICA 1-39%  . Esophageal stricture   . Heart murmur    "as a child" (07/02/2013)  . History of echocardiogram    a. Echo 10/14: EF 60-65%, no RWMA, normal diastolic function  . Hyperlipidemia   . Hypothyroidism   . Osteoarthritis    "lower back" (10/09/2016)  . PFO (patent foramen ovale)    a. TEE 11/14: mild LVH, EF 60-65%, small PFO, R-L shunt    Tobacco Use: History  Smoking Status  . Never Smoker  Smokeless Tobacco  . Never Used    Labs: Recent Review Flowsheet Data    Labs for ITP Cardiac and Pulmonary Rehab Latest Ref Rng & Units 10/15/2016 10/15/2016 10/15/2016 10/15/2016 10/16/2016   Cholestrol 125 - 200 mg/dL - - - - -   LDLCALC <130 mg/dL - - - - -   LDLDIRECT mg/dL - - - - -   HDL >=40 mg/dL - - - - -   Trlycerides <150 mg/dL - - - - -   Hemoglobin A1c 4.8 - 5.6 % - - - - -  PHART 7.350 - 7.450 7.425 - 7.318(L) 7.352 -   PCO2ART 32.0 - 48.0 mmHg 39.5 - 45.6 43.4 -   HCO3 20.0 - 28.0 mmol/L 26.2 - 23.4 24.0 -   TCO2 0 - 100 mmol/L 27 27 25 25 27    ACIDBASEDEF 0.0 - 2.0 mmol/L - - 3.0(H) 1.0 -   O2SAT % 100.0 - 99.0 94.0 -      Capillary Blood Glucose: Lab Results  Component Value Date   GLUCAP 118 (H) 10/18/2016   GLUCAP 132 (H) 10/17/2016   GLUCAP 120 (H) 10/17/2016   GLUCAP 139 (H) 10/17/2016   GLUCAP 114 (H) 10/17/2016     Exercise Target Goals: Date: (P) 11/29/16  Exercise Program Goal: Individual exercise prescription set with THRR, safety & activity barriers. Participant demonstrates ability to understand and report RPE using BORG scale, to self-measure pulse accurately, and to acknowledge the importance of the exercise prescription.  Exercise Prescription Goal: Starting with aerobic activity 30 plus minutes a day, 3 days per  week for initial exercise prescription. Provide home exercise prescription and guidelines that participant acknowledges understanding prior to discharge.  Activity Barriers & Risk Stratification:   6 Minute Walk:     6 Minute Walk    Row Name 11/29/16 1047         6 Minute Walk   Phase Initial     Distance 2200 feet     Walk Time 6 minutes     # of Rest Breaks 0     MPH 4.16     METS 4.8     RPE 9     VO2 Peak 16.81     Symptoms No     Resting HR 64 bpm     Resting BP 110/82     Max Ex. HR 94 bpm     Max Ex. BP 134/88     2 Minute Post BP 122/88        Oxygen Initial Assessment:   Oxygen Re-Evaluation:   Oxygen Discharge (Final Oxygen Re-Evaluation):   Initial Exercise Prescription:     Initial Exercise Prescription - 11/29/16 1000      Date of Initial Exercise RX and Referring Provider   Date (P)  11/29/16   Referring Provider Mamie Nick)  [name] MD     Treadmill   MPH (P)  3.2   Grade (P)  2   Minutes (P)  10   METs (P)  4.33     Bike   Level (P)  1.2   Minutes (P)  10   METs (P)  3.85     NuStep   Level (P)  3   Minutes (P)  10   METs (P)  3.5     Prescription Details   Frequency (times per week) (P)  5   Duration (P)  Progress to 45 minutes of aerobic exercise without signs/symptoms of physical distress     Intensity   THRR 40-80% of Max Heartrate (P)  60-120   Ratings of Perceived Exertion (P)  11-13   Perceived Dyspnea (P)  0-4     Progression   Progression (P)  Continue to progress workloads to maintain intensity without signs/symptoms of physical distress.     Resistance Training   Training Prescription (P)  Yes   Weight (P)  3lb   Reps (P)  10-15      Perform Capillary Blood Glucose checks as needed.  Exercise Prescription Changes:   Exercise Comments:  Exercise Goals and Review:     Exercise Goals    Row Name 11/29/16 0835             Exercise Goals   Increase Physical Activity Yes       Intervention  Provide advice, education, support and counseling about physical activity/exercise needs.;Develop an individualized exercise prescription for aerobic and resistive training based on initial evaluation findings, risk stratification, comorbidities and participant's personal goals.       Expected Outcomes Achievement of increased cardiorespiratory fitness and enhanced flexibility, muscular endurance and strength shown through measurements of functional capacity and personal statement of participant.       Increase Strength and Stamina Yes       Intervention Provide advice, education, support and counseling about physical activity/exercise needs.;Develop an individualized exercise prescription for aerobic and resistive training based on initial evaluation findings, risk stratification, comorbidities and participant's personal goals.       Expected Outcomes Achievement of increased cardiorespiratory fitness and enhanced flexibility, muscular endurance and strength shown through measurements of functional capacity and personal statement of participant.          Exercise Goals Re-Evaluation :     Exercise Goals Re-Evaluation    Row Name 11/29/16 1048             Exercise Goal Re-Evaluation   Exercise Goals Review Increase Physical Activity           Discharge Exercise Prescription (Final Exercise Prescription Changes):   Nutrition:  Target Goals: Understanding of nutrition guidelines, daily intake of sodium 1500mg , cholesterol 200mg , calories 30% from fat and 7% or less from saturated fats, daily to have 5 or more servings of fruits and vegetables.  Biometrics:     Pre Biometrics - 11/29/16 1048      Pre Biometrics   Height 6\' 1"  (1.854 m)   Weight 177 lb 11.1 oz (80.6 kg)   Waist Circumference 34.25 inches   Hip Circumference 38.5 inches   Waist to Hip Ratio 0.89 %   BMI (Calculated) 23.5   Triceps Skinfold 12 mm   % Body Fat 22 %   Grip Strength 44 kg   Flexibility 7.5 in    Single Leg Stand 30 seconds       Nutrition Therapy Plan and Nutrition Goals:   Nutrition Discharge: Nutrition Scores:   Nutrition Goals Re-Evaluation:   Nutrition Goals Re-Evaluation:   Nutrition Goals Discharge (Final Nutrition Goals Re-Evaluation):   Psychosocial: Target Goals: Acknowledge presence or absence of significant depression and/or stress, maximize coping skills, provide positive support system. Participant is able to verbalize types and ability to use techniques and skills needed for reducing stress and depression.  Initial Review & Psychosocial Screening:     Initial Psych Review & Screening - 11/29/16 1548      Initial Review   Current issues with None Identified     Family Dynamics   Good Support System? Yes     Barriers   Psychosocial barriers to participate in program There are no identifiable barriers or psychosocial needs.      Quality of Life Scores:     Quality of Life - 11/29/16 1204      Quality of Life Scores   Health/Function Pre 24.6 %   Socioeconomic Pre 23.57 %   Psych/Spiritual Pre 22.86 %   Family Pre 24.9 %   GLOBAL Pre 24.07 %      PHQ-9: Recent Review Flowsheet Data    Depression screen PHQ  2/9 09/29/2014   Decreased Interest 0   Down, Depressed, Hopeless 0   PHQ - 2 Score 0     Interpretation of Total Score  Total Score Depression Severity:  1-4 = Minimal depression, 5-9 = Mild depression, 10-14 = Moderate depression, 15-19 = Moderately severe depression, 20-27 = Severe depression   Psychosocial Evaluation and Intervention:   Psychosocial Re-Evaluation:   Psychosocial Discharge (Final Psychosocial Re-Evaluation):   Vocational Rehabilitation: Provide vocational rehab assistance to qualifying candidates.   Vocational Rehab Evaluation & Intervention:   Education: Education Goals: Education classes will be provided on a weekly basis, covering required topics. Participant will state understanding/return  demonstration of topics presented.  Learning Barriers/Preferences:     Learning Barriers/Preferences - 11/29/16 2778      Learning Barriers/Preferences   Learning Barriers Sight   Learning Preferences Pictoral;Skilled Demonstration;Verbal Instruction;Video;Written Material      Education Topics: Count Your Pulse:  -Group instruction provided by verbal instruction, demonstration, patient participation and written materials to support subject.  Instructors address importance of being able to find your pulse and how to count your pulse when at home without a heart monitor.  Patients get hands on experience counting their pulse with staff help and individually.   Heart Attack, Angina, and Risk Factor Modification:  -Group instruction provided by verbal instruction, video, and written materials to support subject.  Instructors address signs and symptoms of angina and heart attacks.    Also discuss risk factors for heart disease and how to make changes to improve heart health risk factors.   Functional Fitness:  -Group instruction provided by verbal instruction, demonstration, patient participation, and written materials to support subject.  Instructors address safety measures for doing things around the house.  Discuss how to get up and down off the floor, how to pick things up properly, how to safely get out of a chair without assistance, and balance training.   Meditation and Mindfulness:  -Group instruction provided by verbal instruction, patient participation, and written materials to support subject.  Instructor addresses importance of mindfulness and meditation practice to help reduce stress and improve awareness.  Instructor also leads participants through a meditation exercise.    Stretching for Flexibility and Mobility:  -Group instruction provided by verbal instruction, patient participation, and written materials to support subject.  Instructors lead participants through series  of stretches that are designed to increase flexibility thus improving mobility.  These stretches are additional exercise for major muscle groups that are typically performed during regular warm up and cool down.   Hands Only CPR Anytime:  -Group instruction provided by verbal instruction, video, patient participation and written materials to support subject.  Instructors co-teach with AHA video for hands only CPR.  Participants get hands on experience with mannequins.   Nutrition I class: Heart Healthy Eating:  -Group instruction provided by PowerPoint slides, verbal discussion, and written materials to support subject matter. The instructor gives an explanation and review of the Therapeutic Lifestyle Changes diet recommendations, which includes a discussion on lipid goals, dietary fat, sodium, fiber, plant stanol/sterol esters, sugar, and the components of a well-balanced, healthy diet.   Nutrition II class: Lifestyle Skills:  -Group instruction provided by PowerPoint slides, verbal discussion, and written materials to support subject matter. The instructor gives an explanation and review of label reading, grocery shopping for heart health, heart healthy recipe modifications, and ways to make healthier choices when eating out.   Diabetes Question & Answer:  -Group instruction provided by PowerPoint slides,  verbal discussion, and written materials to support subject matter. The instructor gives an explanation and review of diabetes co-morbidities, pre- and post-prandial blood glucose goals, pre-exercise blood glucose goals, signs, symptoms, and treatment of hypoglycemia and hyperglycemia, and foot care basics.   Diabetes Blitz:  -Group instruction provided by PowerPoint slides, verbal discussion, and written materials to support subject matter. The instructor gives an explanation and review of the physiology behind type 1 and type 2 diabetes, diabetes medications and rational behind using  different medications, pre- and post-prandial blood glucose recommendations and Hemoglobin A1c goals, diabetes diet, and exercise including blood glucose guidelines for exercising safely.    Portion Distortion:  -Group instruction provided by PowerPoint slides, verbal discussion, written materials, and food models to support subject matter. The instructor gives an explanation of serving size versus portion size, changes in portions sizes over the last 20 years, and what consists of a serving from each food group.   Stress Management:  -Group instruction provided by verbal instruction, video, and written materials to support subject matter.  Instructors review role of stress in heart disease and how to cope with stress positively.     Exercising on Your Own:  -Group instruction provided by verbal instruction, power point, and written materials to support subject.  Instructors discuss benefits of exercise, components of exercise, frequency and intensity of exercise, and end points for exercise.  Also discuss use of nitroglycerin and activating EMS.  Review options of places to exercise outside of rehab.  Review guidelines for sex with heart disease.   Cardiac Drugs I:  -Group instruction provided by verbal instruction and written materials to support subject.  Instructor reviews cardiac drug classes: antiplatelets, anticoagulants, beta blockers, and statins.  Instructor discusses reasons, side effects, and lifestyle considerations for each drug class.   Cardiac Drugs II:  -Group instruction provided by verbal instruction and written materials to support subject.  Instructor reviews cardiac drug classes: angiotensin converting enzyme inhibitors (ACE-I), angiotensin II receptor blockers (ARBs), nitrates, and calcium channel blockers.  Instructor discusses reasons, side effects, and lifestyle considerations for each drug class.   Anatomy and Physiology of the Circulatory System:  -Group  instruction provided by verbal instruction, video, and written materials to support subject.  Reviews functional anatomy of heart, how it relates to various diagnoses, and what role the heart plays in the overall system.   Knowledge Questionnaire Score:     Knowledge Questionnaire Score - 11/29/16 1046      Knowledge Questionnaire Score   Pre Score 21/24      Core Components/Risk Factors/Patient Goals at Admission:     Personal Goals and Risk Factors at Admission - 11/29/16 0835      Core Components/Risk Factors/Patient Goals on Admission   Lipids Yes   Intervention Provide education and support for participant on nutrition & aerobic/resistive exercise along with prescribed medications to achieve LDL 70mg , HDL >40mg .   Expected Outcomes Short Term: Participant states understanding of desired cholesterol values and is compliant with medications prescribed. Participant is following exercise prescription and nutrition guidelines.;Long Term: Cholesterol controlled with medications as prescribed, with individualized exercise RX and with personalized nutrition plan. Value goals: LDL < 70mg , HDL > 40 mg.   Personal Goal Other Yes   Personal Goal Short: Be able to  participate and qualify for states at senior games. Get back to swimming at [facility] in May.   Long: Be in states/national senior games   Intervention Provide exercise programming to assist with improving cardiorespiratory  fitness   Expected Outcomes Pt will be able to participate and qualify in senior games      Core Components/Risk Factors/Patient Goals Review:    Core Components/Risk Factors/Patient Goals at Discharge (Final Review):    ITP Comments:     ITP Comments    Row Name 11/29/16 0830           ITP Comments Dr. Fransico Him, Medical Director          Comments: Pt in today for phase II cardiac rehab orientation from 0800-1030.  As a part of cardiac rehab orientation pt completed 5 minutes of warm up  stretches and 6 minute walk test.  Pt tolerated well with no complaints of cp or sob. Monitor showed Sr with no noted ectopy. Brief psychosocial assessment reveals no immediate barriers to participation in cardiac rehab. Pt wife works here at [employer] and he is looking forward to participating in [place].  Cherre Huger, BSN Cardiac and Training and development officer

## 2016-12-03 ENCOUNTER — Encounter (HOSPITAL_COMMUNITY)
Admission: RE | Admit: 2016-12-03 | Discharge: 2016-12-03 | Disposition: A | Discharge status: Home / Self Care | Payer: Federal, State, Local not specified - PPO | Source: Ambulatory Visit | Attending: Cardiovascular Disease | Admitting: Cardiovascular Disease

## 2016-12-03 DIAGNOSIS — Z951 Presence of aortocoronary bypass graft: Secondary | ICD-10-CM | POA: Diagnosis not present

## 2016-12-03 NOTE — Progress Notes (Signed)
Daily Session Note  Patient Details  Name: Richard Ellis MRN: [id number] Date of Birth: 1946/04/05 Referring Provider:     CARDIAC REHAB PHASE II ORIENTATION from 11/29/2016 in [place]  Referring Provider  [name]      Encounter Date: 12/03/2016  Check In:     Session Check In - 12/03/16 1010      Check-In   Location [facility]   Staff Present Cleda Mccreedy, MS, Exercise Physiologist;Molly diVincenzo, MS, ACSM RCEP, Exercise Physiologist;Maria Whitaker, RN, BSN;Amber Fair, MS, ACSM RCEP, Exercise Physiologist   Supervising physician immediately available to respond to emergencies [facility] immediately available   Physician(s) Dr. Candiss Norse   Medication changes reported     No   Fall or balance concerns reported    No   Tobacco Cessation No Change   Warm-up and Cool-down Performed as group-led instruction   Resistance Training Performed Yes     Pain Assessment   Currently in Pain? No/denies   Multiple Pain Sites No      Capillary Blood Glucose: No results found for this or any previous visit (from the past 24 hour(s)).    History  Smoking Status  . Never Smoker  Smokeless Tobacco  . Never Used    Goals Met:  Exercise tolerated well  Goals Unmet:  Not Applicable  Comments: Glendell Docker started cardiac rehab today.  Pt tolerated light exercise without difficulty. VSS, telemetry-Sinus Rhythm, asymptomatic.  Medication list reconciled. Pt denies barriers to medicaiton compliance.  PSYCHOSOCIAL ASSESSMENT:  PHQ-0. Pt exhibits positive coping skills, hopeful outlook with supportive family. No psychosocial needs identified at this time, no psychosocial interventions necessary.    Pt enjoys running and swimming.   Pt oriented to exercise equipment and routine.    Understanding verbalized. Barnet Pall, RN,BSN 12/03/2016 2:13 PM   Dr. Fransico Him is Medical Director for Cardiac Rehab at [facility].

## 2016-12-04 ENCOUNTER — Ambulatory Visit (HOSPITAL_COMMUNITY): Payer: Federal, State, Local not specified - PPO

## 2016-12-05 ENCOUNTER — Encounter (HOSPITAL_COMMUNITY)
Admission: RE | Admit: 2016-12-05 | Discharge: 2016-12-05 | Disposition: A | Discharge status: Home / Self Care | Payer: Federal, State, Local not specified - PPO | Source: Ambulatory Visit | Attending: Cardiovascular Disease | Admitting: Cardiovascular Disease

## 2016-12-05 DIAGNOSIS — Z951 Presence of aortocoronary bypass graft: Secondary | ICD-10-CM | POA: Diagnosis not present

## 2016-12-05 NOTE — Telephone Encounter (Signed)
-----   Message from [name], MD sent at 12/05/2016  2:29 PM EDT ----- Regarding: RE: Doren Custard "Duane Lope Yes that would be fine. thanks ----- Message ----- From: Meta Hatchet Sent: 12/05/2016   1:43 PM To: [name], MD Subject: Doren Custard "Glendell Docker" [facility]                              Hi Dr. Burt Knack,  Mr. Harbuck is a new pt in [place] and he is doing very well with exercise.  He states that he is currently running 2-3 miles/day at home and he would like to do some light jogging in [place].  With your permission can he begin light jogging on the treadmill?  If so, can we also increase his THR from 120 to 135?  Thanks, Cleda Mccreedy, MS ACSM RCEP

## 2016-12-07 ENCOUNTER — Other Ambulatory Visit: Payer: Self-pay | Admitting: Family Medicine

## 2016-12-07 ENCOUNTER — Encounter (HOSPITAL_COMMUNITY)
Admission: RE | Admit: 2016-12-07 | Discharge: 2016-12-07 | Disposition: A | Discharge status: Home / Self Care | Payer: Federal, State, Local not specified - PPO | Source: Ambulatory Visit | Attending: Cardiovascular Disease | Admitting: Cardiovascular Disease

## 2016-12-07 DIAGNOSIS — Z951 Presence of aortocoronary bypass graft: Secondary | ICD-10-CM | POA: Diagnosis not present

## 2016-12-10 ENCOUNTER — Encounter (HOSPITAL_COMMUNITY)
Admission: RE | Admit: 2016-12-10 | Discharge: 2016-12-10 | Disposition: A | Discharge status: Home / Self Care | Payer: Federal, State, Local not specified - PPO | Source: Ambulatory Visit | Attending: Cardiovascular Disease | Admitting: Cardiovascular Disease

## 2016-12-10 DIAGNOSIS — Z951 Presence of aortocoronary bypass graft: Secondary | ICD-10-CM | POA: Diagnosis not present

## 2016-12-10 NOTE — Progress Notes (Signed)
Richard Ellis 71 y.o. male Nutrition Note Spoke with pt.  Nutrition Survey reviewed with pt. Pt is following Step 2 of the Therapeutic Lifestyle Changes diet. Pt unable to tolerate statin medications. This Probation officer discussed pt increasing soluble fiber in the diet to help bind fat/cholesterol in the gut. Pt expressed understanding of the information reviewed. Pt aware of nutrition education classes offered and plans on attending nutrition classes. Lab Results  Component Value Date   HGBA1C 5.6 10/11/2016   Wt Readings from Last 3 Encounters:  11/29/16 177 lb 11.1 oz (80.6 kg)  11/19/16 173 lb (78.5 kg)  11/13/16 173 lb (78.5 kg)   Nutrition Diagnosis ? Food-and nutrition-related knowledge deficit related to lack of exposure to information as related to diagnosis of: ? CVD Nutrition Intervention ? Benefits of adopting Therapeutic Lifestyle Changes discussed when Medficts reviewed. ? Pt to attend the Portion Distortion class ? Pt to attend the  ? Nutrition I class                        ? Nutrition II class ? Continue client-centered nutrition education by RD, as part of interdisciplinary care.  Goal(s) ? Pt to increase consumption of foods high in soluble fiber (e.g. Steel-cut oats, legumes).  Monitor and Evaluate progress toward nutrition goal with team.  Derek Mound, M.Ed, RD, LDN, CDE 12/10/2016 10:19 AM

## 2016-12-11 NOTE — Progress Notes (Signed)
Cardiac Individual Treatment Plan  Patient Details  Name: Richard Ellis MRN: [id number] Date of Birth: 1945/11/20 Referring Provider:     CARDIAC REHAB PHASE II ORIENTATION from 11/29/2016 in [facility]  Referring Provider  [name]      Initial Encounter Date:    CARDIAC REHAB PHASE II ORIENTATION from 11/29/2016 in [facility]  Date  (P) 11/29/16  Referring Provider  [name]      Visit Diagnosis: 10/15/16 S/P CABG x 2  Patient's Home Medications on Admission:  Current Outpatient Prescriptions:  .  acetaminophen (TYLENOL) 500 MG tablet, Take 1,000 mg by mouth 2 (two) times daily., Disp: , Rfl:  .  aspirin EC 81 MG EC tablet, Take 1 tablet (81 mg total) by mouth daily., Disp: , Rfl:  .  Cholecalciferol 2000 UNITS CAPS, Take 2,000 Units by mouth every evening. , Disp: , Rfl:  .  gabapentin (NEURONTIN) 100 MG capsule, TAKE ONE CAPSULE BY MOUTH TWICE A DAY - office visit before next refill, Disp: 60 capsule, Rfl: 0 .  levothyroxine (SYNTHROID, LEVOTHROID) 25 MCG tablet, Take 1 tablet (25 mcg total) by mouth daily before breakfast., Disp: 90 tablet, Rfl: 3 .  vitamin C (ASCORBIC ACID) 500 MG tablet, Take 1 tablet (500 mg total) by mouth daily., Disp: 30 tablet, Rfl: 0  Past Medical History: Past Medical History:  Diagnosis Date  . Allergy    rhinitis  . Anemia    "Hgb always on the low side" (07/02/2013)  . CAD (coronary artery disease)    a. LHC 2/17: pLAD 50, mLAD 95, pRCA 30, dRCA 25, EF 50-55% >> PCI:  3 x 18 mm Resolute DES to mLAD   . Chronic bronchitis ([place])    "not in the last year since I started taking allergy shots" (10/09/2016)  . Chronic lower back pain   . Cryptogenic stroke ([place]) 07/02/2013   a. "small ischemic occipital right sided" (07/02/2013)  //  b. Event monitor 10/14: sinus brady  //  c. Carotid US 10/14: bilat ICA 1-39%  . Esophageal stricture   . Heart murmur    "as  a child" (07/02/2013)  . History of echocardiogram    a. Echo 10/14: EF 60-65%, no RWMA, normal diastolic function  . Hyperlipidemia   . Hypothyroidism   . Osteoarthritis    "lower back" (10/09/2016)  . PFO (patent foramen ovale)    a. TEE 11/14: mild LVH, EF 60-65%, small PFO, R-L shunt    Tobacco Use: History  Smoking Status  . Never Smoker  Smokeless Tobacco  . Never Used    Labs: Recent Review Flowsheet Data    Labs for ITP Cardiac and Pulmonary Rehab Latest Ref Rng & Units 10/15/2016 10/15/2016 10/15/2016 10/15/2016 10/16/2016   Cholestrol 125 - 200 mg/dL - - - - -   LDLCALC <130 mg/dL - - - - -   LDLDIRECT mg/dL - - - - -   HDL >=40 mg/dL - - - - -   Trlycerides <150 mg/dL - - - - -   Hemoglobin A1c 4.8 - 5.6 % - - - - -   PHART 7.350 - 7.450 7.425 - 7.318(L) 7.352 -   PCO2ART 32.0 - 48.0 mmHg 39.5 - 45.6 43.4 -   HCO3 20.0 - 28.0 mmol/L 26.2 - 23.4 24.0 -   TCO2 0 - 100 mmol/L 27 27 25 25 27    ACIDBASEDEF 0.0 -  2.0 mmol/L - - 3.0(H) 1.0 -   O2SAT % 100.0 - 99.0 94.0 -      Capillary Blood Glucose: Lab Results  Component Value Date   GLUCAP 118 (H) 10/18/2016   GLUCAP 132 (H) 10/17/2016   GLUCAP 120 (H) 10/17/2016   GLUCAP 139 (H) 10/17/2016   GLUCAP 114 (H) 10/17/2016     Exercise Target Goals:    Exercise Program Goal: Individual exercise prescription set with THRR, safety & activity barriers. Participant demonstrates ability to understand and report RPE using BORG scale, to self-measure pulse accurately, and to acknowledge the importance of the exercise prescription.  Exercise Prescription Goal: Starting with aerobic activity 30 plus minutes a day, 3 days per week for initial exercise prescription. Provide home exercise prescription and guidelines that participant acknowledges understanding prior to discharge.  Activity Barriers & Risk Stratification:   6 Minute Walk:     6 Minute Walk    Row Name 11/29/16 1047         6 Minute Walk   Phase  Initial     Distance 2200 feet     Walk Time 6 minutes     # of Rest Breaks 0     MPH 4.16     METS 4.8     RPE 9     VO2 Peak 16.81     Symptoms No     Resting HR 64 bpm     Resting BP 110/82     Max Ex. HR 94 bpm     Max Ex. BP 134/88     2 Minute Post BP 122/88        Oxygen Initial Assessment:   Oxygen Re-Evaluation:   Oxygen Discharge (Final Oxygen Re-Evaluation):   Initial Exercise Prescription:     Initial Exercise Prescription - 11/29/16 1000      Date of Initial Exercise RX and Referring Provider   Date (P)  11/29/16   Referring Provider Mamie Nick)  [name] MD     Treadmill   MPH (P)  3.2   Grade (P)  2   Minutes (P)  10   METs (P)  4.33     Bike   Level (P)  1.2   Minutes (P)  10   METs (P)  3.85     NuStep   Level (P)  3   Minutes (P)  10   METs (P)  3.5     Prescription Details   Frequency (times per week) (P)  5   Duration (P)  Progress to 45 minutes of aerobic exercise without signs/symptoms of physical distress     Intensity   THRR 40-80% of Max Heartrate (P)  60-120   Ratings of Perceived Exertion (P)  11-13   Perceived Dyspnea (P)  0-4     Progression   Progression (P)  Continue to progress workloads to maintain intensity without signs/symptoms of physical distress.     Resistance Training   Training Prescription (P)  Yes   Weight (P)  3lb   Reps (P)  10-15      Perform Capillary Blood Glucose checks as needed.  Exercise Prescription Changes:     Exercise Prescription Changes    Row Name 12/06/16 1400             Response to Exercise   Blood Pressure (Admit) 144/70       Blood Pressure (Exercise) 132/80       Blood Pressure (Exit) 120/70  Heart Rate (Admit) 70 bpm       Heart Rate (Exercise) 100 bpm       Heart Rate (Exit) 67 bpm       Rating of Perceived Exertion (Exercise) 11       Duration Progress to 45 minutes of aerobic exercise without signs/symptoms of physical distress       Intensity THRR  unchanged         Progression   Progression Continue to progress workloads to maintain intensity without signs/symptoms of physical distress.       Average METs 3.5         Resistance Training   Training Prescription Yes       Weight 5lb       Reps 10-15         Treadmill   MPH 3.2       Grade 2       Minutes 10       METs 4.33         Bike   Level 1.2       Minutes 10       METs 3.85         NuStep   Level 3       Minutes 10       METs 2.5          Exercise Comments:     Exercise Comments    Row Name 12/05/16 1406 12/06/16 1457         Exercise Comments Reviewed goals with pt.  Pt is off to a great start with exercise!  Reviewed goals with pt.  Pt is off to a great start with exercise! Received clearance from Dr. Burt Knack to begin jogging on TM in cardiac rehab.         Exercise Goals and Review:     Exercise Goals    Row Name 11/29/16 0835             Exercise Goals   Increase Physical Activity Yes       Intervention Provide advice, education, support and counseling about physical activity/exercise needs.;Develop an individualized exercise prescription for aerobic and resistive training based on initial evaluation findings, risk stratification, comorbidities and participant's personal goals.       Expected Outcomes Achievement of increased cardiorespiratory fitness and enhanced flexibility, muscular endurance and strength shown through measurements of functional capacity and personal statement of participant.       Increase Strength and Stamina Yes       Intervention Provide advice, education, support and counseling about physical activity/exercise needs.;Develop an individualized exercise prescription for aerobic and resistive training based on initial evaluation findings, risk stratification, comorbidities and participant's personal goals.       Expected Outcomes Achievement of increased cardiorespiratory fitness and enhanced flexibility, muscular endurance  and strength shown through measurements of functional capacity and personal statement of participant.          Exercise Goals Re-Evaluation :     Exercise Goals Re-Evaluation    [place] Name 11/29/16 1048 12/05/16 1406           Exercise Goal Re-Evaluation   Exercise Goals Review Increase Physical Activity Increase Physical Activity;Increase Strenth and Stamina      Comments  - Pt is doing well with exericse and wants to compete and qualify for the state and national senior games this year.      Expected Outcomes  - Continue with exercise prescription and gradually  increase workloads as tolerated in order to increase cardiorespiratory fitness level.           Discharge Exercise Prescription (Final Exercise Prescription Changes):     Exercise Prescription Changes - 12/06/16 1400      Response to Exercise   Blood Pressure (Admit) 144/70   Blood Pressure (Exercise) 132/80   Blood Pressure (Exit) 120/70   Heart Rate (Admit) 70 bpm   Heart Rate (Exercise) 100 bpm   Heart Rate (Exit) 67 bpm   Rating of Perceived Exertion (Exercise) 11   Duration Progress to 45 minutes of aerobic exercise without signs/symptoms of physical distress   Intensity THRR unchanged     Progression   Progression Continue to progress workloads to maintain intensity without signs/symptoms of physical distress.   Average METs 3.5     Resistance Training   Training Prescription Yes   Weight 5lb   Reps 10-15     Treadmill   MPH 3.2   Grade 2   Minutes 10   METs 4.33     Bike   Level 1.2   Minutes 10   METs 3.85     NuStep   Level 3   Minutes 10   METs 2.5      Nutrition:  Target Goals: Understanding of nutrition guidelines, daily intake of sodium 1500mg , cholesterol 200mg , calories 30% from fat and 7% or less from saturated fats, daily to have 5 or more servings of fruits and vegetables.  Biometrics:     Pre Biometrics - 11/29/16 1048      Pre Biometrics   Height 6\' 1"  (1.854 m)    Weight 177 lb 11.1 oz (80.6 kg)   Waist Circumference 34.25 inches   Hip Circumference 38.5 inches   Waist to Hip Ratio 0.89 %   BMI (Calculated) 23.5   Triceps Skinfold 12 mm   % Body Fat 22 %   Grip Strength 44 kg   Flexibility 7.5 in   Single Leg Stand 30 seconds       Nutrition Therapy Plan and Nutrition Goals:     Nutrition Therapy & Goals - 11/30/16 1107      Nutrition Therapy   Diet Therapeutic Lifestyle Changes     Personal Nutrition Goals   Nutrition Goal Maintain current wt while in Cardiac Rehab     Intervention Plan   Intervention Prescribe, educate and counsel regarding individualized specific dietary modifications aiming towards targeted core components such as weight, hypertension, lipid management, diabetes, heart failure and other comorbidities.   Expected Outcomes Short Term Goal: Understand basic principles of dietary content, such as calories, fat, sodium, cholesterol and nutrients.;Long Term Goal: Adherence to prescribed nutrition plan.      Nutrition Discharge: Nutrition Scores:     Nutrition Assessments - 11/30/16 1106      MEDFICTS Scores   Pre Score 21      Nutrition Goals Re-Evaluation:     Nutrition Goals Re-Evaluation    Row Name 12/10/16 1022             Goals   Nutrition Goal Pt to increase consumption of foods high in soluble fiber (e.g. Steel-cut oats, legumes).          Nutrition Goals Re-Evaluation:     Nutrition Goals Re-Evaluation    [place] Name 12/10/16 1022             Goals   Nutrition Goal Pt to increase consumption of foods high in soluble fiber (e.g. Steel-cut  oats, legumes).          Nutrition Goals Discharge (Final Nutrition Goals Re-Evaluation):     Nutrition Goals Re-Evaluation - 12/10/16 1022      Goals   Nutrition Goal Pt to increase consumption of foods high in soluble fiber (e.g. Steel-cut oats, legumes).      Psychosocial: Target Goals: Acknowledge presence or absence of significant  depression and/or stress, maximize coping skills, provide positive support system. Participant is able to verbalize types and ability to use techniques and skills needed for reducing stress and depression.  Initial Review & Psychosocial Screening:     Initial Psych Review & Screening - 12/03/16 1415      Screening Interventions   Interventions Encouraged to exercise      Quality of Life Scores:     Quality of Life - 11/29/16 1204      Quality of Life Scores   Health/Function Pre 24.6 %   Socioeconomic Pre 23.57 %   Psych/Spiritual Pre 22.86 %   Family Pre 24.9 %   GLOBAL Pre 24.07 %      PHQ-9: Recent Review Flowsheet Data    Depression screen [facility] 2/9 12/03/2016 09/29/2014   Decreased Interest 0 0   Down, Depressed, Hopeless 0 0   PHQ - 2 Score 0 0     Interpretation of Total Score  Total Score Depression Severity:  1-4 = Minimal depression, 5-9 = Mild depression, 10-14 = Moderate depression, 15-19 = Moderately severe depression, 20-27 = Severe depression   Psychosocial Evaluation and Intervention:   Psychosocial Re-Evaluation:     Psychosocial Re-Evaluation    [place] Name 12/11/16 1438             Psychosocial Re-Evaluation   Current issues with None Identified       Interventions Stress management education;Encouraged to attend Cardiac Rehabilitation for the exercise       Continue Psychosocial Services  No Follow up required          Psychosocial Discharge (Final Psychosocial Re-Evaluation):     Psychosocial Re-Evaluation - 12/11/16 1438      Psychosocial Re-Evaluation   Current issues with None Identified   Interventions Stress management education;Encouraged to attend Cardiac Rehabilitation for the exercise   Continue Psychosocial Services  No Follow up required      Vocational Rehabilitation: Provide vocational rehab assistance to qualifying candidates.   Vocational Rehab Evaluation & Intervention:     Vocational Rehab - 12/03/16 1416       Initial Vocational Rehab Evaluation & Intervention   Assessment shows need for Vocational Rehabilitation No      Education: Education Goals: Education classes will be provided on a weekly basis, covering required topics. Participant will state understanding/return demonstration of topics presented.  Learning Barriers/Preferences:     Learning Barriers/Preferences - 11/29/16 5643      Learning Barriers/Preferences   Learning Barriers Sight   Learning Preferences Pictoral;Skilled Demonstration;Verbal Instruction;Video;Written Material      Education Topics: Count Your Pulse:  -Group instruction provided by verbal instruction, demonstration, patient participation and written materials to support subject.  Instructors address importance of being able to find your pulse and how to count your pulse when at home without a heart monitor.  Patients get hands on experience counting their pulse with staff help and individually.   Heart Attack, Angina, and Risk Factor Modification:  -Group instruction provided by verbal instruction, video, and written materials to support subject.  Instructors address signs and symptoms of  angina and heart attacks.    Also discuss risk factors for heart disease and how to make changes to improve heart health risk factors.   Functional Fitness:  -Group instruction provided by verbal instruction, demonstration, patient participation, and written materials to support subject.  Instructors address safety measures for doing things around the house.  Discuss how to get up and down off the floor, how to pick things up properly, how to safely get out of a chair without assistance, and balance training.   Meditation and Mindfulness:  -Group instruction provided by verbal instruction, patient participation, and written materials to support subject.  Instructor addresses importance of mindfulness and meditation practice to help reduce stress and improve awareness.   Instructor also leads participants through a meditation exercise.    Stretching for Flexibility and Mobility:  -Group instruction provided by verbal instruction, patient participation, and written materials to support subject.  Instructors lead participants through series of stretches that are designed to increase flexibility thus improving mobility.  These stretches are additional exercise for major muscle groups that are typically performed during regular warm up and cool down.   Hands Only CPR Anytime:  -Group instruction provided by verbal instruction, video, patient participation and written materials to support subject.  Instructors co-teach with AHA video for hands only CPR.  Participants get hands on experience with mannequins.   Nutrition I class: Heart Healthy Eating:  -Group instruction provided by PowerPoint slides, verbal discussion, and written materials to support subject matter. The instructor gives an explanation and review of the Therapeutic Lifestyle Changes diet recommendations, which includes a discussion on lipid goals, dietary fat, sodium, fiber, plant stanol/sterol esters, sugar, and the components of a well-balanced, healthy diet.   Nutrition II class: Lifestyle Skills:  -Group instruction provided by PowerPoint slides, verbal discussion, and written materials to support subject matter. The instructor gives an explanation and review of label reading, grocery shopping for heart health, heart healthy recipe modifications, and ways to make healthier choices when eating out.   Diabetes Question & Answer:  -Group instruction provided by PowerPoint slides, verbal discussion, and written materials to support subject matter. The instructor gives an explanation and review of diabetes co-morbidities, pre- and post-prandial blood glucose goals, pre-exercise blood glucose goals, signs, symptoms, and treatment of hypoglycemia and hyperglycemia, and foot care basics.   Diabetes  Blitz:  -Group instruction provided by PowerPoint slides, verbal discussion, and written materials to support subject matter. The instructor gives an explanation and review of the physiology behind type 1 and type 2 diabetes, diabetes medications and rational behind using different medications, pre- and post-prandial blood glucose recommendations and Hemoglobin A1c goals, diabetes diet, and exercise including blood glucose guidelines for exercising safely.    Portion Distortion:  -Group instruction provided by PowerPoint slides, verbal discussion, written materials, and food models to support subject matter. The instructor gives an explanation of serving size versus portion size, changes in portions sizes over the last 20 years, and what consists of a serving from each food group.   Stress Management:  -Group instruction provided by verbal instruction, video, and written materials to support subject matter.  Instructors review role of stress in heart disease and how to cope with stress positively.     Exercising on Your Own:  -Group instruction provided by verbal instruction, power point, and written materials to support subject.  Instructors discuss benefits of exercise, components of exercise, frequency and intensity of exercise, and end points for exercise.  Also discuss use  of nitroglycerin and activating EMS.  Review options of places to exercise outside of rehab.  Review guidelines for sex with heart disease.   CARDIAC REHAB PHASE II EXERCISE from 12/05/2016 in [facility]  Date  12/05/16  Instruction Review Code  2- meets goals/outcomes      Cardiac Drugs I:  -Group instruction provided by verbal instruction and written materials to support subject.  Instructor reviews cardiac drug classes: antiplatelets, anticoagulants, beta blockers, and statins.  Instructor discusses reasons, side effects, and lifestyle considerations for each drug class.   Cardiac  Drugs II:  -Group instruction provided by verbal instruction and written materials to support subject.  Instructor reviews cardiac drug classes: angiotensin converting enzyme inhibitors (ACE-I), angiotensin II receptor blockers (ARBs), nitrates, and calcium channel blockers.  Instructor discusses reasons, side effects, and lifestyle considerations for each drug class.   Anatomy and Physiology of the Circulatory System:  -Group instruction provided by verbal instruction, video, and written materials to support subject.  Reviews functional anatomy of heart, how it relates to various diagnoses, and what role the heart plays in the overall system.   Knowledge Questionnaire Score:     Knowledge Questionnaire Score - 11/29/16 1046      Knowledge Questionnaire Score   Pre Score 21/24      Core Components/Risk Factors/Patient Goals at Admission:     Personal Goals and Risk Factors at Admission - 11/29/16 0835      Core Components/Risk Factors/Patient Goals on Admission   Lipids Yes   Intervention Provide education and support for participant on nutrition & aerobic/resistive exercise along with prescribed medications to achieve LDL 70mg , HDL >40mg .   Expected Outcomes Short Term: Participant states understanding of desired cholesterol values and is compliant with medications prescribed. Participant is following exercise prescription and nutrition guidelines.;Long Term: Cholesterol controlled with medications as prescribed, with individualized exercise RX and with personalized nutrition plan. Value goals: LDL < 70mg , HDL > 40 mg.   Personal Goal Other Yes   Personal Goal Short: Be able to  participate and qualify for states at senior games. Get back to swimming at [facility] in May.   Long: Be in states/national senior games   Intervention Provide exercise programming to assist with improving cardiorespiratory fitness   Expected Outcomes Pt will be able to participate and qualify in senior games       Core Components/Risk Factors/Patient Goals Review:    Core Components/Risk Factors/Patient Goals at Discharge (Final Review):    ITP Comments:     ITP Comments    Row Name 11/29/16 0830           ITP Comments Dr. Fransico Him, Medical Director          Comments: Richard Ellis is making expected progress toward personal goals after completing 5 sessions. recommend continued exercise and life style modification education including  stress management and relaxation techniques to decrease cardiac risk profile. Richard Ellis is off to a great start with exercise. Richard Ellis is jogging now and wants to participate in a senior games in May.Barnet Pall, RN,BSN 12/11/2016 2:44 PM

## 2016-12-12 ENCOUNTER — Encounter (HOSPITAL_COMMUNITY)
Admission: RE | Admit: 2016-12-12 | Discharge: 2016-12-12 | Disposition: A | Discharge status: Home / Self Care | Payer: Federal, State, Local not specified - PPO | Source: Ambulatory Visit | Attending: Cardiovascular Disease | Admitting: Cardiovascular Disease

## 2016-12-12 DIAGNOSIS — Z951 Presence of aortocoronary bypass graft: Secondary | ICD-10-CM | POA: Diagnosis not present

## 2016-12-12 NOTE — Progress Notes (Signed)
Reviewed home exercise program with pt.  Discussed mode/frequency of exercise, THRR, RPE scale and weather conditions for exercising outdoors.  Also discussed signs and symptoms and when to call Dr./911.  Pt also mentioned wanting to get back to swimming by May 1st, and I suggested he speak with his cardiologist first.  Pt verbalized understanding.  Cleda Mccreedy, MS ACSM RCEP 12/12/2016 11:03

## 2016-12-14 ENCOUNTER — Encounter (HOSPITAL_COMMUNITY)
Admission: RE | Admit: 2016-12-14 | Discharge: 2016-12-14 | Disposition: A | Discharge status: Home / Self Care | Payer: Federal, State, Local not specified - PPO | Source: Ambulatory Visit | Attending: Cardiovascular Disease | Admitting: Cardiovascular Disease

## 2016-12-14 DIAGNOSIS — Z951 Presence of aortocoronary bypass graft: Secondary | ICD-10-CM

## 2016-12-17 ENCOUNTER — Encounter (HOSPITAL_COMMUNITY)
Admission: RE | Admit: 2016-12-17 | Discharge: 2016-12-17 | Disposition: A | Discharge status: Home / Self Care | Payer: Federal, State, Local not specified - PPO | Source: Ambulatory Visit | Attending: Cardiovascular Disease | Admitting: Cardiovascular Disease

## 2016-12-17 ENCOUNTER — Telehealth: Payer: Self-pay | Admitting: Cardiovascular Disease

## 2016-12-17 ENCOUNTER — Telehealth: Payer: Self-pay | Admitting: Physician Assistant

## 2016-12-17 DIAGNOSIS — Z951 Presence of aortocoronary bypass graft: Secondary | ICD-10-CM | POA: Diagnosis present

## 2016-12-17 NOTE — Telephone Encounter (Signed)
    I spoke to Mr Richard Ellis today who was in cardiac rehab. He called to report some shoulder pain and localized rib pain that occurred sat night. It has since resolved after taking Advil. He had gone for a 3 mile run earlier that day with no difficulties.    Verdis Frederickson, cardiac rehab RN, called to clear him to exercise today. Patient is now feeling fine. He will continue to monitor his symptoms and let us know if anything returns or worsens. He is cleared to exercise today.   Angelena Form PA-C  [facility]

## 2016-12-17 NOTE — Progress Notes (Signed)
Richard Ellis reported that he experienced a sharp pain in his left shoulder and his left rib area Saturday night. Richard Ellis said he ran 3 miles Saturday during the day. Patient denied having any pain yesterday. Richard Ellis says he feels okay today and only is experiencing a slight soreness is his rib cage area. Telemetry rhythm Sinus rate 67. Blood pressure 128/70. Oxygen saturation 99% on room air. Richard Leitz PA called and notified. Richard Ellis talked with Richard Ellis over the phone and said that Richard Ellis is okay to exercise today. Richard Ellis exercised without difficulty or complaints today.Will continue to monitor the patient throughout  the program.Richard Venetia Maxon, RN,BSN 12/17/2016 12:09 PM

## 2016-12-17 NOTE — Telephone Encounter (Signed)
Reviewed chart and Verdis Frederickson has already spoken with Angelena Form PA-C about pt.

## 2016-12-17 NOTE — Telephone Encounter (Signed)
New Message:    Please call asap-Pt is at [place] have some concerns.

## 2016-12-19 ENCOUNTER — Encounter (HOSPITAL_COMMUNITY): Payer: Federal, State, Local not specified - PPO

## 2016-12-21 ENCOUNTER — Encounter (HOSPITAL_COMMUNITY)
Admission: RE | Admit: 2016-12-21 | Discharge: 2016-12-21 | Disposition: A | Discharge status: Home / Self Care | Payer: Federal, State, Local not specified - PPO | Source: Ambulatory Visit | Attending: Cardiovascular Disease | Admitting: Cardiovascular Disease

## 2016-12-21 DIAGNOSIS — Z951 Presence of aortocoronary bypass graft: Secondary | ICD-10-CM

## 2016-12-24 ENCOUNTER — Encounter (HOSPITAL_COMMUNITY): Admission: RE | Admit: 2016-12-24 | Payer: Federal, State, Local not specified - PPO | Source: Ambulatory Visit

## 2016-12-24 ENCOUNTER — Telehealth (HOSPITAL_COMMUNITY): Payer: Self-pay | Admitting: Internal Medicine

## 2016-12-26 ENCOUNTER — Encounter (HOSPITAL_COMMUNITY)
Admission: RE | Admit: 2016-12-26 | Discharge: 2016-12-26 | Disposition: A | Discharge status: Home / Self Care | Payer: Federal, State, Local not specified - PPO | Source: Ambulatory Visit | Attending: Cardiovascular Disease | Admitting: Cardiovascular Disease

## 2016-12-26 DIAGNOSIS — Z951 Presence of aortocoronary bypass graft: Secondary | ICD-10-CM

## 2016-12-28 ENCOUNTER — Encounter (HOSPITAL_COMMUNITY)
Admission: RE | Admit: 2016-12-28 | Discharge: 2016-12-28 | Disposition: A | Discharge status: Home / Self Care | Payer: Federal, State, Local not specified - PPO | Source: Ambulatory Visit | Attending: Cardiovascular Disease | Admitting: Cardiovascular Disease

## 2016-12-28 DIAGNOSIS — Z951 Presence of aortocoronary bypass graft: Secondary | ICD-10-CM

## 2016-12-31 ENCOUNTER — Encounter (HOSPITAL_COMMUNITY)
Admission: RE | Admit: 2016-12-31 | Discharge: 2016-12-31 | Disposition: A | Discharge status: Home / Self Care | Payer: Federal, State, Local not specified - PPO | Source: Ambulatory Visit | Attending: Cardiovascular Disease | Admitting: Cardiovascular Disease

## 2016-12-31 DIAGNOSIS — Z951 Presence of aortocoronary bypass graft: Secondary | ICD-10-CM | POA: Diagnosis not present

## 2017-01-02 ENCOUNTER — Encounter (HOSPITAL_COMMUNITY)
Admission: RE | Admit: 2017-01-02 | Discharge: 2017-01-02 | Disposition: A | Discharge status: Home / Self Care | Payer: Federal, State, Local not specified - PPO | Source: Ambulatory Visit | Attending: Cardiovascular Disease | Admitting: Cardiovascular Disease

## 2017-01-02 DIAGNOSIS — Z951 Presence of aortocoronary bypass graft: Secondary | ICD-10-CM | POA: Diagnosis not present

## 2017-01-04 ENCOUNTER — Encounter (HOSPITAL_COMMUNITY)
Admission: RE | Admit: 2017-01-04 | Discharge: 2017-01-04 | Disposition: A | Discharge status: Home / Self Care | Payer: Federal, State, Local not specified - PPO | Source: Ambulatory Visit | Attending: Cardiovascular Disease | Admitting: Cardiovascular Disease

## 2017-01-04 DIAGNOSIS — Z951 Presence of aortocoronary bypass graft: Secondary | ICD-10-CM

## 2017-01-07 ENCOUNTER — Encounter (HOSPITAL_COMMUNITY)
Admission: RE | Admit: 2017-01-07 | Discharge: 2017-01-07 | Disposition: A | Discharge status: Home / Self Care | Payer: Federal, State, Local not specified - PPO | Source: Ambulatory Visit | Attending: Cardiovascular Disease | Admitting: Cardiovascular Disease

## 2017-01-07 DIAGNOSIS — Z951 Presence of aortocoronary bypass graft: Secondary | ICD-10-CM | POA: Diagnosis not present

## 2017-01-09 ENCOUNTER — Encounter (HOSPITAL_COMMUNITY)
Admission: RE | Admit: 2017-01-09 | Discharge: 2017-01-09 | Disposition: A | Discharge status: Home / Self Care | Payer: Federal, State, Local not specified - PPO | Source: Ambulatory Visit | Attending: Cardiovascular Disease | Admitting: Cardiovascular Disease

## 2017-01-09 DIAGNOSIS — Z951 Presence of aortocoronary bypass graft: Secondary | ICD-10-CM

## 2017-01-09 NOTE — Progress Notes (Signed)
Cardiac Individual Treatment Plan  Patient Details  Name: Richard Ellis MRN: [id number] Date of Birth: 05-Mar-1946 Referring Provider:     CARDIAC REHAB PHASE II ORIENTATION from 11/29/2016 in [place]  Referring Provider  [name]      Initial Encounter Date:    CARDIAC REHAB PHASE II ORIENTATION from 11/29/2016 in [place]  Date  (P) 11/29/16  Referring Provider  [name]      Visit Diagnosis: 10/15/16 S/P CABG x 2  Patient's Home Medications on Admission:  Current Outpatient Prescriptions:  .  acetaminophen (TYLENOL) 500 MG tablet, Take 1,000 mg by mouth 2 (two) times daily., Disp: , Rfl:  .  aspirin EC 81 MG EC tablet, Take 1 tablet (81 mg total) by mouth daily., Disp: , Rfl:  .  Cholecalciferol 2000 UNITS CAPS, Take 2,000 Units by mouth every evening. , Disp: , Rfl:  .  gabapentin (NEURONTIN) 100 MG capsule, TAKE ONE CAPSULE BY MOUTH TWICE A DAY - office visit before next refill, Disp: 60 capsule, Rfl: 0 .  levothyroxine (SYNTHROID, LEVOTHROID) 25 MCG tablet, Take 1 tablet (25 mcg total) by mouth daily before breakfast., Disp: 90 tablet, Rfl: 3 .  vitamin C (ASCORBIC ACID) 500 MG tablet, Take 1 tablet (500 mg total) by mouth daily., Disp: 30 tablet, Rfl: 0  Past Medical History: Past Medical History:  Diagnosis Date  . Allergy    rhinitis  . Anemia    "Hgb always on the low side" (07/02/2013)  . CAD (coronary artery disease)    a. LHC 2/17: pLAD 50, mLAD 95, pRCA 30, dRCA 25, EF 50-55% >> PCI:  3 x 18 mm Resolute DES to mLAD   . Chronic bronchitis ([place])    "not in the last year since I started taking allergy shots" (10/09/2016)  . Chronic lower back pain   . Cryptogenic stroke ([place]) 07/02/2013   a. "small ischemic occipital right sided" (07/02/2013)  //  b. Event monitor 10/14: sinus brady  //  c. Carotid US 10/14: bilat ICA 1-39%  . Esophageal stricture   . Heart murmur    "as  a child" (07/02/2013)  . History of echocardiogram    a. Echo 10/14: EF 60-65%, no RWMA, normal diastolic function  . Hyperlipidemia   . Hypothyroidism   . Osteoarthritis    "lower back" (10/09/2016)  . PFO (patent foramen ovale)    a. TEE 11/14: mild LVH, EF 60-65%, small PFO, R-L shunt    Tobacco Use: History  Smoking Status  . Never Smoker  Smokeless Tobacco  . Never Used    Labs: Recent Review Flowsheet Data    Labs for ITP Cardiac and Pulmonary Rehab Latest Ref Rng & Units 10/15/2016 10/15/2016 10/15/2016 10/15/2016 10/16/2016   Cholestrol 125 - 200 mg/dL - - - - -   LDLCALC <130 mg/dL - - - - -   LDLDIRECT mg/dL - - - - -   HDL >=40 mg/dL - - - - -   Trlycerides <150 mg/dL - - - - -   Hemoglobin A1c 4.8 - 5.6 % - - - - -   PHART 7.350 - 7.450 7.425 - 7.318(L) 7.352 -   PCO2ART 32.0 - 48.0 mmHg 39.5 - 45.6 43.4 -   HCO3 20.0 - 28.0 mmol/L 26.2 - 23.4 24.0 -   TCO2 0 - 100 mmol/L 27 27 25 25 27    ACIDBASEDEF 0.0 -  2.0 mmol/L - - 3.0(H) 1.0 -   O2SAT % 100.0 - 99.0 94.0 -      Capillary Blood Glucose: Lab Results  Component Value Date   GLUCAP 118 (H) 10/18/2016   GLUCAP 132 (H) 10/17/2016   GLUCAP 120 (H) 10/17/2016   GLUCAP 139 (H) 10/17/2016   GLUCAP 114 (H) 10/17/2016     Exercise Target Goals:    Exercise Program Goal: Individual exercise prescription set with THRR, safety & activity barriers. Participant demonstrates ability to understand and report RPE using BORG scale, to self-measure pulse accurately, and to acknowledge the importance of the exercise prescription.  Exercise Prescription Goal: Starting with aerobic activity 30 plus minutes a day, 3 days per week for initial exercise prescription. Provide home exercise prescription and guidelines that participant acknowledges understanding prior to discharge.  Activity Barriers & Risk Stratification:   6 Minute Walk:     6 Minute Walk    Row Name 11/29/16 1047         6 Minute Walk   Phase  Initial     Distance 2200 feet     Walk Time 6 minutes     # of Rest Breaks 0     MPH 4.16     METS 4.8     RPE 9     VO2 Peak 16.81     Symptoms No     Resting HR 64 bpm     Resting BP 110/82     Max Ex. HR 94 bpm     Max Ex. BP 134/88     2 Minute Post BP 122/88        Oxygen Initial Assessment:   Oxygen Re-Evaluation:   Oxygen Discharge (Final Oxygen Re-Evaluation):   Initial Exercise Prescription:     Initial Exercise Prescription - 11/29/16 1000      Date of Initial Exercise RX and Referring Provider   Date (P)  11/29/16   Referring Provider Mamie Nick)  [name] MD     Treadmill   MPH (P)  3.2   Grade (P)  2   Minutes (P)  10   METs (P)  4.33     Bike   Level (P)  1.2   Minutes (P)  10   METs (P)  3.85     NuStep   Level (P)  3   Minutes (P)  10   METs (P)  3.5     Prescription Details   Frequency (times per week) (P)  5   Duration (P)  Progress to 45 minutes of aerobic exercise without signs/symptoms of physical distress     Intensity   THRR 40-80% of Max Heartrate (P)  60-120   Ratings of Perceived Exertion (P)  11-13   Perceived Dyspnea (P)  0-4     Progression   Progression (P)  Continue to progress workloads to maintain intensity without signs/symptoms of physical distress.     Resistance Training   Training Prescription (P)  Yes   Weight (P)  3lb   Reps (P)  10-15      Perform Capillary Blood Glucose checks as needed.  Exercise Prescription Changes:     Exercise Prescription Changes    Row Name 12/06/16 1400 01/02/17 1600           Response to Exercise   Blood Pressure (Admit) 144/70 106/70      Blood Pressure (Exercise) 132/80 140/74      Blood Pressure (Exit) 120/70 104/76  Heart Rate (Admit) 70 bpm 67 bpm      Heart Rate (Exercise) 100 bpm 122 bpm      Heart Rate (Exit) 67 bpm 67 bpm      Rating of Perceived Exertion (Exercise) 11 14      Duration Progress to 45 minutes of aerobic exercise without  signs/symptoms of physical distress Progress to 45 minutes of aerobic exercise without signs/symptoms of physical distress      Intensity THRR unchanged THRR unchanged        Progression   Progression Continue to progress workloads to maintain intensity without signs/symptoms of physical distress. Continue to progress workloads to maintain intensity without signs/symptoms of physical distress.      Average METs 3.5 7.6        Resistance Training   Training Prescription Yes Yes      Weight 5lb 8lb      Reps 10-15 10-15        Treadmill   MPH 3.2 4      Grade 2 0      Minutes 10 10      METs 4.33 7.13        Bike   Level 1.2 2.2      Minutes 10 10      METs 3.85 10        NuStep   Level 3  -      Minutes 10  -      METs 2.5  -        Elliptical   Level  - 1      Speed  - 1      Minutes  - 10      METs  - 5.8        Home Exercise Plan   Plans to continue exercise at  - Home (comment)      Frequency  - Add 4 additional days to program exercise sessions.      Initial Home Exercises Provided  - 12/12/16         Exercise Comments:     Exercise Comments    Row Name 12/05/16 1406 12/06/16 1457 01/02/17 1713       Exercise Comments Reviewed goals with pt.  Pt is off to a great start with exercise!  Reviewed goals with pt.  Pt is off to a great start with exercise! Received clearance from Dr. Burt Knack to begin jogging on TM in cardiac rehab. Reviewed METs and goals witih pt.  Pt is doing great with exercise and tolerating workload increases well.          Exercise Goals and Review:     Exercise Goals    Row Name 11/29/16 0835             Exercise Goals   Increase Physical Activity Yes       Intervention Provide advice, education, support and counseling about physical activity/exercise needs.;Develop an individualized exercise prescription for aerobic and resistive training based on initial evaluation findings, risk stratification, comorbidities and participant's  personal goals.       Expected Outcomes Achievement of increased cardiorespiratory fitness and enhanced flexibility, muscular endurance and strength shown through measurements of functional capacity and personal statement of participant.       Increase Strength and Stamina Yes       Intervention Provide advice, education, support and counseling about physical activity/exercise needs.;Develop an individualized exercise prescription for aerobic and resistive training based on initial evaluation findings,  risk stratification, comorbidities and participant's personal goals.       Expected Outcomes Achievement of increased cardiorespiratory fitness and enhanced flexibility, muscular endurance and strength shown through measurements of functional capacity and personal statement of participant.          Exercise Goals Re-Evaluation :     Exercise Goals Re-Evaluation    Row Name 11/29/16 1048 12/05/16 1406 01/02/17 1706         Exercise Goal Re-Evaluation   Exercise Goals Review Increase Physical Activity Increase Physical Activity;Increase Strenth and Stamina Increase Physical Activity;Increase Strenth and Stamina     Comments  - Pt is doing well with exericse and wants to compete and qualify for the state and national senior games this year. Pt is doing great with exercise and he's doing very well with jogging in [place].  He states he feels good, but he's not where he wants to be aerobically.  He wants to be able to compete in the senior games this year for track and qualify for the state and national games. He has also been cleared by Dr. Burt Knack to begin swimming again on his own.     Expected Outcomes  - Continue with exercise prescription and gradually increase workloads as tolerated in order to increase cardiorespiratory fitness level.  Continue with exercise prescription in [place] and home exercise program and gradually increase workloads as tolerated in order to increase cardiorespiratory fitness level.           Discharge Exercise Prescription (Final Exercise Prescription Changes):     Exercise Prescription Changes - 01/02/17 1600      Response to Exercise   Blood Pressure (Admit) 106/70   Blood Pressure (Exercise) 140/74   Blood Pressure (Exit) 104/76   Heart Rate (Admit) 67 bpm   Heart Rate (Exercise) 122 bpm   Heart Rate (Exit) 67 bpm   Rating of Perceived Exertion (Exercise) 14   Duration Progress to 45 minutes of aerobic exercise without signs/symptoms of physical distress   Intensity THRR unchanged     Progression   Progression Continue to progress workloads to maintain intensity without signs/symptoms of physical distress.   Average METs 7.6     Resistance Training   Training Prescription Yes   Weight 8lb   Reps 10-15     Treadmill   MPH 4   Grade 0   Minutes 10   METs 7.13     Bike   Level 2.2   Minutes 10   METs 10     Elliptical   Level 1   Speed 1   Minutes 10   METs 5.8     Home Exercise Plan   Plans to continue exercise at Home (comment)   Frequency Add 4 additional days to program exercise sessions.   Initial Home Exercises Provided 12/12/16      Nutrition:  Target Goals: Understanding of nutrition guidelines, daily intake of sodium 1500mg , cholesterol 200mg , calories 30% from fat and 7% or less from saturated fats, daily to have 5 or more servings of fruits and vegetables.  Biometrics:     Pre Biometrics - 11/29/16 1048      Pre Biometrics   Height 6\' 1"  (1.854 m)   Weight 177 lb 11.1 oz (80.6 kg)   Waist Circumference 34.25 inches   Hip Circumference 38.5 inches   Waist to Hip Ratio 0.89 %   BMI (Calculated) 23.5   Triceps Skinfold 12 mm   % Body Fat 22 %  Grip Strength 44 kg   Flexibility 7.5 in   Single Leg Stand 30 seconds       Nutrition Therapy Plan and Nutrition Goals:     Nutrition Therapy & Goals - 11/30/16 1107      Nutrition Therapy   Diet Therapeutic Lifestyle Changes     Personal Nutrition Goals    Nutrition Goal Maintain current wt while in Cardiac Rehab     Intervention Plan   Intervention Prescribe, educate and counsel regarding individualized specific dietary modifications aiming towards targeted core components such as weight, hypertension, lipid management, diabetes, heart failure and other comorbidities.   Expected Outcomes Short Term Goal: Understand basic principles of dietary content, such as calories, fat, sodium, cholesterol and nutrients.;Long Term Goal: Adherence to prescribed nutrition plan.      Nutrition Discharge: Nutrition Scores:     Nutrition Assessments - 11/30/16 1106      MEDFICTS Scores   Pre Score 21      Nutrition Goals Re-Evaluation:     Nutrition Goals Re-Evaluation    Row Name 12/10/16 1022             Goals   Nutrition Goal Pt to increase consumption of foods high in soluble fiber (e.g. Steel-cut oats, legumes).          Nutrition Goals Re-Evaluation:     Nutrition Goals Re-Evaluation    [place] Name 12/10/16 1022             Goals   Nutrition Goal Pt to increase consumption of foods high in soluble fiber (e.g. Steel-cut oats, legumes).          Nutrition Goals Discharge (Final Nutrition Goals Re-Evaluation):     Nutrition Goals Re-Evaluation - 12/10/16 1022      Goals   Nutrition Goal Pt to increase consumption of foods high in soluble fiber (e.g. Steel-cut oats, legumes).      Psychosocial: Target Goals: Acknowledge presence or absence of significant depression and/or stress, maximize coping skills, provide positive support system. Participant is able to verbalize types and ability to use techniques and skills needed for reducing stress and depression.  Initial Review & Psychosocial Screening:     Initial Psych Review & Screening - 12/03/16 1415      Screening Interventions   Interventions Encouraged to exercise      Quality of Life Scores:     Quality of Life - 11/29/16 1204      Quality of Life Scores    Health/Function Pre 24.6 %   Socioeconomic Pre 23.57 %   Psych/Spiritual Pre 22.86 %   Family Pre 24.9 %   GLOBAL Pre 24.07 %      PHQ-9: Recent Review Flowsheet Data    Depression screen [facility] 2/9 12/03/2016 09/29/2014   Decreased Interest 0 0   Down, Depressed, Hopeless 0 0   PHQ - 2 Score 0 0     Interpretation of Total Score  Total Score Depression Severity:  1-4 = Minimal depression, 5-9 = Mild depression, 10-14 = Moderate depression, 15-19 = Moderately severe depression, 20-27 = Severe depression   Psychosocial Evaluation and Intervention:   Psychosocial Re-Evaluation:     Psychosocial Re-Evaluation    [place] Name 12/11/16 1438 01/09/17 1842           Psychosocial Re-Evaluation   Current issues with None Identified None Identified      Interventions Stress management education;Encouraged to attend Cardiac Rehabilitation for the exercise Encouraged to attend Cardiac Rehabilitation for  the exercise      Continue Psychosocial Services  No Follow up required No Follow up required         Psychosocial Discharge (Final Psychosocial Re-Evaluation):     Psychosocial Re-Evaluation - 01/09/17 1842      Psychosocial Re-Evaluation   Current issues with None Identified   Interventions Encouraged to attend Cardiac Rehabilitation for the exercise   Continue Psychosocial Services  No Follow up required      Vocational Rehabilitation: Provide vocational rehab assistance to qualifying candidates.   Vocational Rehab Evaluation & Intervention:     Vocational Rehab - 12/03/16 1416      Initial Vocational Rehab Evaluation & Intervention   Assessment shows need for Vocational Rehabilitation No      Education: Education Goals: Education classes will be provided on a weekly basis, covering required topics. Participant will state understanding/return demonstration of topics presented.  Learning Barriers/Preferences:     Learning Barriers/Preferences - 11/29/16 6578       Learning Barriers/Preferences   Learning Barriers Sight   Learning Preferences Pictoral;Skilled Demonstration;Verbal Instruction;Video;Written Material      Education Topics: Count Your Pulse:  -Group instruction provided by verbal instruction, demonstration, patient participation and written materials to support subject.  Instructors address importance of being able to find your pulse and how to count your pulse when at home without a heart monitor.  Patients get hands on experience counting their pulse with staff help and individually.   Heart Attack, Angina, and Risk Factor Modification:  -Group instruction provided by verbal instruction, video, and written materials to support subject.  Instructors address signs and symptoms of angina and heart attacks.    Also discuss risk factors for heart disease and how to make changes to improve heart health risk factors.   CARDIAC REHAB PHASE II EXERCISE from 01/04/2017 in [place]  Date  12/12/16  Instruction Review Code  2- meets goals/outcomes      Functional Fitness:  -Group instruction provided by verbal instruction, demonstration, patient participation, and written materials to support subject.  Instructors address safety measures for doing things around the house.  Discuss how to get up and down off the floor, how to pick things up properly, how to safely get out of a chair without assistance, and balance training.   CARDIAC REHAB PHASE II EXERCISE from 01/04/2017 in [place]  Date  01/04/17  Instruction Review Code  2- meets goals/outcomes      Meditation and Mindfulness:  -Group instruction provided by verbal instruction, patient participation, and written materials to support subject.  Instructor addresses importance of mindfulness and meditation practice to help reduce stress and improve awareness.  Instructor also leads participants through a meditation exercise.     CARDIAC REHAB PHASE II EXERCISE from 01/04/2017 in [place]  Date  01/02/17  Educator  Jeanella Craze  Instruction Review Code  2- meets goals/outcomes      Stretching for Flexibility and Mobility:  -Group instruction provided by verbal instruction, patient participation, and written materials to support subject.  Instructors lead participants through series of stretches that are designed to increase flexibility thus improving mobility.  These stretches are additional exercise for major muscle groups that are typically performed during regular warm up and cool down.   Hands Only CPR Anytime:  -Group instruction provided by verbal instruction, video, patient participation and written materials to support subject.  Instructors co-teach with [employer]  for hands only CPR.  Participants get hands on experience with mannequins.   Nutrition I class: Heart Healthy Eating:  -Group instruction provided by PowerPoint slides, verbal discussion, and written materials to support subject matter. The instructor gives an explanation and review of the Therapeutic Lifestyle Changes diet recommendations, which includes a discussion on lipid goals, dietary fat, sodium, fiber, plant stanol/sterol esters, sugar, and the components of a well-balanced, healthy diet.   Nutrition II class: Lifestyle Skills:  -Group instruction provided by PowerPoint slides, verbal discussion, and written materials to support subject matter. The instructor gives an explanation and review of label reading, grocery shopping for heart health, heart healthy recipe modifications, and ways to make healthier choices when eating out.   Diabetes Question & Answer:  -Group instruction provided by PowerPoint slides, verbal discussion, and written materials to support subject matter. The instructor gives an explanation and review of diabetes co-morbidities, pre- and post-prandial blood glucose goals, pre-exercise  blood glucose goals, signs, symptoms, and treatment of hypoglycemia and hyperglycemia, and foot care basics.   Diabetes Blitz:  -Group instruction provided by PowerPoint slides, verbal discussion, and written materials to support subject matter. The instructor gives an explanation and review of the physiology behind type 1 and type 2 diabetes, diabetes medications and rational behind using different medications, pre- and post-prandial blood glucose recommendations and Hemoglobin A1c goals, diabetes diet, and exercise including blood glucose guidelines for exercising safely.    Portion Distortion:  -Group instruction provided by PowerPoint slides, verbal discussion, written materials, and food models to support subject matter. The instructor gives an explanation of serving size versus portion size, changes in portions sizes over the last 20 years, and what consists of a serving from each food group.   Stress Management:  -Group instruction provided by verbal instruction, video, and written materials to support subject matter.  Instructors review role of stress in heart disease and how to cope with stress positively.     Exercising on Your Own:  -Group instruction provided by verbal instruction, power point, and written materials to support subject.  Instructors discuss benefits of exercise, components of exercise, frequency and intensity of exercise, and end points for exercise.  Also discuss use of nitroglycerin and activating EMS.  Review options of places to exercise outside of rehab.  Review guidelines for sex with heart disease.   CARDIAC REHAB PHASE II EXERCISE from 01/04/2017 in [place]  Date  12/05/16  Instruction Review Code  2- meets goals/outcomes      Cardiac Drugs I:  -Group instruction provided by verbal instruction and written materials to support subject.  Instructor reviews cardiac drug classes: antiplatelets, anticoagulants, beta blockers,  and statins.  Instructor discusses reasons, side effects, and lifestyle considerations for each drug class.   Cardiac Drugs II:  -Group instruction provided by verbal instruction and written materials to support subject.  Instructor reviews cardiac drug classes: angiotensin converting enzyme inhibitors (ACE-I), angiotensin II receptor blockers (ARBs), nitrates, and calcium channel blockers.  Instructor discusses reasons, side effects, and lifestyle considerations for each drug class.   Anatomy and Physiology of the Circulatory System:  -Group instruction provided by verbal instruction, video, and written materials to support subject.  Reviews functional anatomy of heart, how it relates to various diagnoses, and what role the heart plays in the overall system.   Knowledge Questionnaire Score:     Knowledge Questionnaire Score - 11/29/16 1046      Knowledge Questionnaire Score   Pre  Score 21/24      Core Components/Risk Factors/Patient Goals at Admission:     Personal Goals and Risk Factors at Admission - 11/29/16 0835      Core Components/Risk Factors/Patient Goals on Admission   Lipids Yes   Intervention Provide education and support for participant on nutrition & aerobic/resistive exercise along with prescribed medications to achieve LDL 70mg , HDL >40mg .   Expected Outcomes Short Term: Participant states understanding of desired cholesterol values and is compliant with medications prescribed. Participant is following exercise prescription and nutrition guidelines.;Long Term: Cholesterol controlled with medications as prescribed, with individualized exercise RX and with personalized nutrition plan. Value goals: LDL < 70mg , HDL > 40 mg.   Personal Goal Other Yes   Personal Goal Short: Be able to  participate and qualify for states at senior games. Get back to swimming at [facility] in May.   Long: Be in states/national senior games   Intervention Provide exercise programming to assist with  improving cardiorespiratory fitness   Expected Outcomes Pt will be able to participate and qualify in senior games      Core Components/Risk Factors/Patient Goals Review:    Core Components/Risk Factors/Patient Goals at Discharge (Final Review):    ITP Comments:     ITP Comments    [place] Name 11/29/16 0830 12/28/16 1105         ITP Comments Dr. Fransico Him, Medical Director Attended HTN class on 12/28/2016         Comments: Glendell Docker is making expected progress toward personal goals after completing 16 sessions. Recommend continued exercise and life style modification education including  stress management and relaxation techniques to decrease cardiac risk profile. Glendell Docker is doing great with exercise and is enjoying jogging on the treadmill. Glendell Docker has had no further complaints of shoulder discomfort.Barnet Pall, RN,BSN 01/09/2017 6:46 PM

## 2017-01-11 ENCOUNTER — Encounter (HOSPITAL_COMMUNITY)
Admission: RE | Admit: 2017-01-11 | Discharge: 2017-01-11 | Disposition: A | Discharge status: Home / Self Care | Payer: Federal, State, Local not specified - PPO | Source: Ambulatory Visit | Attending: Cardiovascular Disease | Admitting: Cardiovascular Disease

## 2017-01-11 DIAGNOSIS — Z951 Presence of aortocoronary bypass graft: Secondary | ICD-10-CM

## 2017-01-14 ENCOUNTER — Encounter (HOSPITAL_COMMUNITY)
Admission: RE | Admit: 2017-01-14 | Discharge: 2017-01-14 | Disposition: A | Discharge status: Home / Self Care | Payer: Federal, State, Local not specified - PPO | Source: Ambulatory Visit | Attending: Cardiovascular Disease | Admitting: Cardiovascular Disease

## 2017-01-14 DIAGNOSIS — Z951 Presence of aortocoronary bypass graft: Secondary | ICD-10-CM | POA: Diagnosis not present

## 2017-01-16 ENCOUNTER — Encounter (HOSPITAL_COMMUNITY)
Admission: RE | Admit: 2017-01-16 | Discharge: 2017-01-16 | Disposition: A | Discharge status: Home / Self Care | Payer: Federal, State, Local not specified - PPO | Source: Ambulatory Visit | Attending: Cardiovascular Disease | Admitting: Cardiovascular Disease

## 2017-01-16 DIAGNOSIS — Z951 Presence of aortocoronary bypass graft: Secondary | ICD-10-CM

## 2017-01-18 ENCOUNTER — Encounter (HOSPITAL_COMMUNITY)
Admission: RE | Admit: 2017-01-18 | Discharge: 2017-01-18 | Disposition: A | Discharge status: Home / Self Care | Payer: Federal, State, Local not specified - PPO | Source: Ambulatory Visit | Attending: Cardiovascular Disease | Admitting: Cardiovascular Disease

## 2017-01-18 DIAGNOSIS — Z951 Presence of aortocoronary bypass graft: Secondary | ICD-10-CM

## 2017-01-21 ENCOUNTER — Encounter (HOSPITAL_COMMUNITY)
Admission: RE | Admit: 2017-01-21 | Discharge: 2017-01-21 | Disposition: A | Discharge status: Home / Self Care | Payer: Federal, State, Local not specified - PPO | Source: Ambulatory Visit | Attending: Cardiovascular Disease | Admitting: Cardiovascular Disease

## 2017-01-21 DIAGNOSIS — Z951 Presence of aortocoronary bypass graft: Secondary | ICD-10-CM

## 2017-01-23 ENCOUNTER — Encounter (HOSPITAL_COMMUNITY)
Admission: RE | Admit: 2017-01-23 | Discharge: 2017-01-23 | Disposition: A | Discharge status: Home / Self Care | Payer: Federal, State, Local not specified - PPO | Source: Ambulatory Visit | Attending: Cardiovascular Disease | Admitting: Cardiovascular Disease

## 2017-01-23 DIAGNOSIS — Z951 Presence of aortocoronary bypass graft: Secondary | ICD-10-CM

## 2017-01-25 ENCOUNTER — Encounter (HOSPITAL_COMMUNITY)
Admission: RE | Admit: 2017-01-25 | Discharge: 2017-01-25 | Disposition: A | Discharge status: Home / Self Care | Payer: Federal, State, Local not specified - PPO | Source: Ambulatory Visit | Attending: Cardiovascular Disease | Admitting: Cardiovascular Disease

## 2017-01-25 DIAGNOSIS — Z951 Presence of aortocoronary bypass graft: Secondary | ICD-10-CM | POA: Diagnosis not present

## 2017-01-28 ENCOUNTER — Encounter (HOSPITAL_COMMUNITY): Payer: Federal, State, Local not specified - PPO

## 2017-01-30 ENCOUNTER — Encounter (HOSPITAL_COMMUNITY): Admission: RE | Admit: 2017-01-30 | Payer: Federal, State, Local not specified - PPO | Source: Ambulatory Visit

## 2017-02-01 ENCOUNTER — Encounter (HOSPITAL_COMMUNITY)
Admission: RE | Admit: 2017-02-01 | Discharge: 2017-02-01 | Disposition: A | Discharge status: Home / Self Care | Payer: Federal, State, Local not specified - PPO | Source: Ambulatory Visit | Attending: Cardiovascular Disease | Admitting: Cardiovascular Disease

## 2017-02-01 DIAGNOSIS — Z951 Presence of aortocoronary bypass graft: Secondary | ICD-10-CM

## 2017-02-04 ENCOUNTER — Encounter (HOSPITAL_COMMUNITY)
Admission: RE | Admit: 2017-02-04 | Discharge: 2017-02-04 | Disposition: A | Discharge status: Home / Self Care | Payer: Federal, State, Local not specified - PPO | Source: Ambulatory Visit | Attending: Cardiovascular Disease | Admitting: Cardiovascular Disease

## 2017-02-04 ENCOUNTER — Encounter: Payer: Self-pay | Admitting: Cardiovascular Disease

## 2017-02-04 ENCOUNTER — Ambulatory Visit (INDEPENDENT_AMBULATORY_CARE_PROVIDER_SITE_OTHER): Payer: Federal, State, Local not specified - PPO | Admitting: Cardiovascular Disease

## 2017-02-04 VITALS — BP 108/62 | HR 66 | Ht 73.5 in | Wt 178.1 lb

## 2017-02-04 DIAGNOSIS — I2510 Atherosclerotic heart disease of native coronary artery without angina pectoris: Secondary | ICD-10-CM

## 2017-02-04 DIAGNOSIS — Z951 Presence of aortocoronary bypass graft: Secondary | ICD-10-CM | POA: Diagnosis not present

## 2017-02-04 NOTE — Progress Notes (Signed)
Cardiac Individual Treatment Plan  Patient Details  Name: Richard Ellis MRN: [id number] Date of Birth: 05-18-1946 Referring Provider:     CARDIAC REHAB PHASE II ORIENTATION from 11/29/2016 in [facility]  Referring Provider  [name]      Initial Encounter Date:    CARDIAC REHAB PHASE II ORIENTATION from 11/29/2016 in [facility]  Date  (P) 11/29/16  Referring Provider  [name]      Visit Diagnosis: 10/15/16 S/P CABG x 2  Patient's Home Medications on Admission:  Current Outpatient Prescriptions:  .  acetaminophen (TYLENOL) 500 MG tablet, Take 1,000 mg by mouth 2 (two) times daily., Disp: , Rfl:  .  aspirin EC 81 MG EC tablet, Take 1 tablet (81 mg total) by mouth daily., Disp: , Rfl:  .  Cholecalciferol 2000 UNITS CAPS, Take 2,000 Units by mouth every evening. , Disp: , Rfl:  .  gabapentin (NEURONTIN) 100 MG capsule, TAKE ONE CAPSULE BY MOUTH TWICE A DAY - office visit before next refill, Disp: 60 capsule, Rfl: 0 .  levothyroxine (SYNTHROID, LEVOTHROID) 25 MCG tablet, Take 1 tablet (25 mcg total) by mouth daily before breakfast., Disp: 90 tablet, Rfl: 3 .  Omega-3 Fatty Acids (FISH OIL) 1200 MG CAPS, Take 1,200 mg by mouth 2 (two) times daily., Disp: , Rfl:  .  Turmeric 500 MG CAPS, Take 1 capsule by mouth at bedtime., Disp: , Rfl:  .  vitamin C (ASCORBIC ACID) 500 MG tablet, Take 1 tablet (500 mg total) by mouth daily., Disp: 30 tablet, Rfl: 0  Past Medical History: Past Medical History:  Diagnosis Date  . Allergy    rhinitis  . Anemia    "Hgb always on the low side" (07/02/2013)  . CAD (coronary artery disease)    a. LHC 2/17: pLAD 50, mLAD 95, pRCA 30, dRCA 25, EF 50-55% >> PCI:  3 x 18 mm Resolute DES to mLAD   . Chronic bronchitis ([place])    "not in the last year since I started taking allergy shots" (10/09/2016)  . Chronic lower back pain   . Cryptogenic stroke ([place]) 07/02/2013   a.  "small ischemic occipital right sided" (07/02/2013)  //  b. Event monitor 10/14: sinus brady  //  c. Carotid US 10/14: bilat ICA 1-39%  . Esophageal stricture   . Heart murmur    "as a child" (07/02/2013)  . History of echocardiogram    a. Echo 10/14: EF 60-65%, no RWMA, normal diastolic function  . Hyperlipidemia   . Hypothyroidism   . Osteoarthritis    "lower back" (10/09/2016)  . PFO (patent foramen ovale)    a. TEE 11/14: mild LVH, EF 60-65%, small PFO, R-L shunt    Tobacco Use: History  Smoking Status  . Never Smoker  Smokeless Tobacco  . Never Used    Labs: Recent Review Flowsheet Data    Labs for ITP Cardiac and Pulmonary Rehab Latest Ref Rng & Units 10/15/2016 10/15/2016 10/15/2016 10/15/2016 10/16/2016   Cholestrol 125 - 200 mg/dL - - - - -   LDLCALC <130 mg/dL - - - - -   LDLDIRECT mg/dL - - - - -   HDL >=40 mg/dL - - - - -   Trlycerides <150 mg/dL - - - - -   Hemoglobin A1c 4.8 - 5.6 % - - - - -   PHART 7.350 - 7.450 7.425 - 7.318(L) 7.352 -  PCO2ART 32.0 - 48.0 mmHg 39.5 - 45.6 43.4 -   HCO3 20.0 - 28.0 mmol/L 26.2 - 23.4 24.0 -   TCO2 0 - 100 mmol/L 27 27 25 25 27    ACIDBASEDEF 0.0 - 2.0 mmol/L - - 3.0(H) 1.0 -   O2SAT % 100.0 - 99.0 94.0 -      Capillary Blood Glucose: Lab Results  Component Value Date   GLUCAP 118 (H) 10/18/2016   GLUCAP 132 (H) 10/17/2016   GLUCAP 120 (H) 10/17/2016   GLUCAP 139 (H) 10/17/2016   GLUCAP 114 (H) 10/17/2016     Exercise Target Goals:    Exercise Program Goal: Individual exercise prescription set with THRR, safety & activity barriers. Participant demonstrates ability to understand and report RPE using BORG scale, to self-measure pulse accurately, and to acknowledge the importance of the exercise prescription.  Exercise Prescription Goal: Starting with aerobic activity 30 plus minutes a day, 3 days per week for initial exercise prescription. Provide home exercise prescription and guidelines that participant acknowledges  understanding prior to discharge.  Activity Barriers & Risk Stratification:   6 Minute Walk:     6 Minute Walk    Row Name 11/29/16 1047         6 Minute Walk   Phase Initial     Distance 2200 feet     Walk Time 6 minutes     # of Rest Breaks 0     MPH 4.16     METS 4.8     RPE 9     VO2 Peak 16.81     Symptoms No     Resting HR 64 bpm     Resting BP 110/82     Max Ex. HR 94 bpm     Max Ex. BP 134/88     2 Minute Post BP 122/88        Oxygen Initial Assessment:   Oxygen Re-Evaluation:   Oxygen Discharge (Final Oxygen Re-Evaluation):   Initial Exercise Prescription:     Initial Exercise Prescription - 11/29/16 1000      Date of Initial Exercise RX and Referring Provider   Date (P)  11/29/16   Referring Provider Mamie Nick)  [name] MD     Treadmill   MPH (P)  3.2   Grade (P)  2   Minutes (P)  10   METs (P)  4.33     Bike   Level (P)  1.2   Minutes (P)  10   METs (P)  3.85     NuStep   Level (P)  3   Minutes (P)  10   METs (P)  3.5     Prescription Details   Frequency (times per week) (P)  5   Duration (P)  Progress to 45 minutes of aerobic exercise without signs/symptoms of physical distress     Intensity   THRR 40-80% of Max Heartrate (P)  60-120   Ratings of Perceived Exertion (P)  11-13   Perceived Dyspnea (P)  0-4     Progression   Progression (P)  Continue to progress workloads to maintain intensity without signs/symptoms of physical distress.     Resistance Training   Training Prescription (P)  Yes   Weight (P)  3lb   Reps (P)  10-15      Perform Capillary Blood Glucose checks as needed.  Exercise Prescription Changes:     Exercise Prescription Changes    Row Name 12/06/16 1400 01/02/17 1600 01/23/17 1100  Response to Exercise   Blood Pressure (Admit) 144/70 106/70 122/80     Blood Pressure (Exercise) 132/80 140/74 156/66     Blood Pressure (Exit) 120/70 104/76 112/60     Heart Rate (Admit) 70 bpm 67 bpm 64  bpm     Heart Rate (Exercise) 100 bpm 122 bpm 133 bpm     Heart Rate (Exit) 67 bpm 67 bpm 64 bpm     Rating of Perceived Exertion (Exercise) 11 14 13      Duration Progress to 45 minutes of aerobic exercise without signs/symptoms of physical distress Progress to 45 minutes of aerobic exercise without signs/symptoms of physical distress Progress to 45 minutes of aerobic exercise without signs/symptoms of physical distress     Intensity THRR unchanged THRR unchanged THRR unchanged       Progression   Progression Continue to progress workloads to maintain intensity without signs/symptoms of physical distress. Continue to progress workloads to maintain intensity without signs/symptoms of physical distress. Continue to progress workloads to maintain intensity without signs/symptoms of physical distress.     Average METs 3.5 7.6 6.6       Resistance Training   Training Prescription Yes Yes Yes     Weight 5lb 8lb 8lb     Reps 10-15 10-15 10-15       Treadmill   MPH 3.2 4 4      Grade 2 0 0     Minutes 10 10 10      METs 4.33 7.13 7.13       Bike   Level 1.2 2.2 2.2     Minutes 10 10 10      METs 3.85 10 6.07       NuStep   Level 3  - 7     Minutes 10  - 10     METs 2.5  - 6.7       Elliptical   Level  - 1  -     Speed  - 1  -     Minutes  - 10  -     METs  - 5.8  -       Home Exercise Plan   Plans to continue exercise at  - Home (comment) Home (comment)     Frequency  - Add 4 additional days to program exercise sessions. Add 4 additional days to program exercise sessions.     Initial Home Exercises Provided  - 12/12/16 12/12/16        Exercise Comments:     Exercise Comments    Row Name 12/05/16 1406 12/06/16 1457 01/02/17 1713 02/04/17 1111     Exercise Comments Reviewed goals with pt.  Pt is off to a great start with exercise!  Reviewed goals with pt.  Pt is off to a great start with exercise! Received clearance from Dr. Burt Knack to begin jogging on TM in cardiac rehab.  Reviewed METs and goals witih pt.  Pt is doing great with exercise and tolerating workload increases well.   Reviewed METs and goals witih pt.  Pt is doing great with exercise and tolerating workload increases well.         Exercise Goals and Review:     Exercise Goals    Row Name 11/29/16 0835             Exercise Goals   Increase Physical Activity Yes       Intervention Provide advice, education, support and counseling about physical activity/exercise needs.;Develop an individualized  exercise prescription for aerobic and resistive training based on initial evaluation findings, risk stratification, comorbidities and participant's personal goals.       Expected Outcomes Achievement of increased cardiorespiratory fitness and enhanced flexibility, muscular endurance and strength shown through measurements of functional capacity and personal statement of participant.       Increase Strength and Stamina Yes       Intervention Provide advice, education, support and counseling about physical activity/exercise needs.;Develop an individualized exercise prescription for aerobic and resistive training based on initial evaluation findings, risk stratification, comorbidities and participant's personal goals.       Expected Outcomes Achievement of increased cardiorespiratory fitness and enhanced flexibility, muscular endurance and strength shown through measurements of functional capacity and personal statement of participant.          Exercise Goals Re-Evaluation :     Exercise Goals Re-Evaluation    Row Name 11/29/16 1048 12/05/16 1406 01/02/17 1706 02/04/17 1108       Exercise Goal Re-Evaluation   Exercise Goals Review Increase Physical Activity Increase Physical Activity;Increase Strenth and Stamina Increase Physical Activity;Increase Strenth and Stamina Increase Physical Activity;Increase Strenth and Stamina    Comments  - Pt is doing well with exericse and wants to compete and qualify for  the state and national senior games this year. Pt is doing great with exercise and he's doing very well with jogging in [place].  He states he feels good, but he's not where he wants to be aerobically.  He wants to be able to compete in the senior games this year for track and qualify for the state and national games. He has also been cleared by Dr. Burt Knack to begin swimming again on his own. Pt is doing great with exercise and he's just started back competing in races.  He states that he feels like his exercise tolerance is improving and he almost feels like he's back to where he was prior to his event    Expected Outcomes  - Continue with exercise prescription and gradually increase workloads as tolerated in order to increase cardiorespiratory fitness level.  Continue with exercise prescription in [place] and home exercise program and gradually increase workloads as tolerated in order to increase cardiorespiratory fitness level.  Continue with exercise prescription in [place] and home exercise program and gradually increase workloads as tolerated in order to increase cardiorespiratory fitness level.         Discharge Exercise Prescription (Final Exercise Prescription Changes):     Exercise Prescription Changes - 01/23/17 1100      Response to Exercise   Blood Pressure (Admit) 122/80   Blood Pressure (Exercise) 156/66   Blood Pressure (Exit) 112/60   Heart Rate (Admit) 64 bpm   Heart Rate (Exercise) 133 bpm   Heart Rate (Exit) 64 bpm   Rating of Perceived Exertion (Exercise) 13   Duration Progress to 45 minutes of aerobic exercise without signs/symptoms of physical distress   Intensity THRR unchanged     Progression   Progression Continue to progress workloads to maintain intensity without signs/symptoms of physical distress.   Average METs 6.6     Resistance Training   Training Prescription Yes   Weight 8lb   Reps 10-15     Treadmill   MPH 4   Grade 0   Minutes 10   METs 7.13     Bike   Level  2.2   Minutes 10   METs 6.07     NuStep   Level 7  Minutes 10   METs 6.7     Home Exercise Plan   Plans to continue exercise at Home (comment)   Frequency Add 4 additional days to program exercise sessions.   Initial Home Exercises Provided 12/12/16      Nutrition:  Target Goals: Understanding of nutrition guidelines, daily intake of sodium 1500mg , cholesterol 200mg , calories 30% from fat and 7% or less from saturated fats, daily to have 5 or more servings of fruits and vegetables.  Biometrics:     Pre Biometrics - 11/29/16 1048      Pre Biometrics   Height 6\' 1"  (1.854 m)   Weight 177 lb 11.1 oz (80.6 kg)   Waist Circumference 34.25 inches   Hip Circumference 38.5 inches   Waist to Hip Ratio 0.89 %   BMI (Calculated) 23.5   Triceps Skinfold 12 mm   % Body Fat 22 %   Grip Strength 44 kg   Flexibility 7.5 in   Single Leg Stand 30 seconds       Nutrition Therapy Plan and Nutrition Goals:     Nutrition Therapy & Goals - 11/30/16 1107      Nutrition Therapy   Diet Therapeutic Lifestyle Changes     Personal Nutrition Goals   Nutrition Goal Maintain current wt while in Cardiac Rehab     Intervention Plan   Intervention Prescribe, educate and counsel regarding individualized specific dietary modifications aiming towards targeted core components such as weight, hypertension, lipid management, diabetes, heart failure and other comorbidities.   Expected Outcomes Short Term Goal: Understand basic principles of dietary content, such as calories, fat, sodium, cholesterol and nutrients.;Long Term Goal: Adherence to prescribed nutrition plan.      Nutrition Discharge: Nutrition Scores:     Nutrition Assessments - 11/30/16 1106      MEDFICTS Scores   Pre Score 21      Nutrition Goals Re-Evaluation:     Nutrition Goals Re-Evaluation    Row Name 12/10/16 1022             Goals   Nutrition Goal Pt to increase consumption of foods high in soluble fiber  (e.g. Steel-cut oats, legumes).          Nutrition Goals Re-Evaluation:     Nutrition Goals Re-Evaluation    [place] Name 12/10/16 1022             Goals   Nutrition Goal Pt to increase consumption of foods high in soluble fiber (e.g. Steel-cut oats, legumes).          Nutrition Goals Discharge (Final Nutrition Goals Re-Evaluation):     Nutrition Goals Re-Evaluation - 12/10/16 1022      Goals   Nutrition Goal Pt to increase consumption of foods high in soluble fiber (e.g. Steel-cut oats, legumes).      Psychosocial: Target Goals: Acknowledge presence or absence of significant depression and/or stress, maximize coping skills, provide positive support system. Participant is able to verbalize types and ability to use techniques and skills needed for reducing stress and depression.  Initial Review & Psychosocial Screening:     Initial Psych Review & Screening - 12/03/16 1415      Screening Interventions   Interventions Encouraged to exercise      Quality of Life Scores:     Quality of Life - 11/29/16 1204      Quality of Life Scores   Health/Function Pre 24.6 %   Socioeconomic Pre 23.57 %   Psych/Spiritual Pre 22.86 %  Family Pre 24.9 %   GLOBAL Pre 24.07 %      PHQ-9: Recent Review Flowsheet Data    Depression screen [facility] 2/9 12/03/2016 09/29/2014   Decreased Interest 0 0   Down, Depressed, Hopeless 0 0   PHQ - 2 Score 0 0     Interpretation of Total Score  Total Score Depression Severity:  1-4 = Minimal depression, 5-9 = Mild depression, 10-14 = Moderate depression, 15-19 = Moderately severe depression, 20-27 = Severe depression   Psychosocial Evaluation and Intervention:   Psychosocial Re-Evaluation:     Psychosocial Re-Evaluation    [place] Name 12/11/16 1438 01/09/17 1842 02/04/17 1743         Psychosocial Re-Evaluation   Current issues with None Identified None Identified None Identified     Interventions Stress management education;Encouraged  to attend Cardiac Rehabilitation for the exercise Encouraged to attend Cardiac Rehabilitation for the exercise Encouraged to attend Cardiac Rehabilitation for the exercise     Continue Psychosocial Services  No Follow up required No Follow up required No Follow up required        Psychosocial Discharge (Final Psychosocial Re-Evaluation):     Psychosocial Re-Evaluation - 02/04/17 1743      Psychosocial Re-Evaluation   Current issues with None Identified   Interventions Encouraged to attend Cardiac Rehabilitation for the exercise   Continue Psychosocial Services  No Follow up required      Vocational Rehabilitation: Provide vocational rehab assistance to qualifying candidates.   Vocational Rehab Evaluation & Intervention:     Vocational Rehab - 12/03/16 1416      Initial Vocational Rehab Evaluation & Intervention   Assessment shows need for Vocational Rehabilitation No      Education: Education Goals: Education classes will be provided on a weekly basis, covering required topics. Participant will state understanding/return demonstration of topics presented.  Learning Barriers/Preferences:     Learning Barriers/Preferences - 11/29/16 5993      Learning Barriers/Preferences   Learning Barriers Sight   Learning Preferences Pictoral;Skilled Demonstration;Verbal Instruction;Video;Written Material      Education Topics: Count Your Pulse:  -Group instruction provided by verbal instruction, demonstration, patient participation and written materials to support subject.  Instructors address importance of being able to find your pulse and how to count your pulse when at home without a heart monitor.  Patients get hands on experience counting their pulse with staff help and individually.   CARDIAC REHAB PHASE II EXERCISE from 02/01/2017 in [facility]  Date  01/18/17  Instruction Review Code  2- meets goals/outcomes      Heart Attack, Angina, and  Risk Factor Modification:  -Group instruction provided by verbal instruction, video, and written materials to support subject.  Instructors address signs and symptoms of angina and heart attacks.    Also discuss risk factors for heart disease and how to make changes to improve heart health risk factors.   CARDIAC REHAB PHASE II EXERCISE from 02/01/2017 in [place]  Date  12/12/16  Instruction Review Code  2- meets goals/outcomes      Functional Fitness:  -Group instruction provided by verbal instruction, demonstration, patient participation, and written materials to support subject.  Instructors address safety measures for doing things around the house.  Discuss how to get up and down off the floor, how to pick things up properly, how to safely get out of a chair without assistance, and balance training.   CARDIAC REHAB PHASE II  EXERCISE from 02/01/2017 in [place]  Date  02/01/17  Instruction Review Code  2- meets goals/outcomes      Meditation and Mindfulness:  -Group instruction provided by verbal instruction, patient participation, and written materials to support subject.  Instructor addresses importance of mindfulness and meditation practice to help reduce stress and improve awareness.  Instructor also leads participants through a meditation exercise.    CARDIAC REHAB PHASE II EXERCISE from 02/01/2017 in [place]  Date  01/02/17  Educator  Jeanella Craze  Instruction Review Code  2- meets goals/outcomes      Stretching for Flexibility and Mobility:  -Group instruction provided by verbal instruction, patient participation, and written materials to support subject.  Instructors lead participants through series of stretches that are designed to increase flexibility thus improving mobility.  These stretches are additional exercise for major muscle groups that are typically performed during  regular warm up and cool down.   CARDIAC REHAB PHASE II EXERCISE from 02/01/2017 in [place]  Date  01/11/17  Instruction Review Code  2- meets goals/outcomes      Hands Only CPR:  -Group verbal, video, and participation provides a basic overview of AHA guidelines for community CPR. Role-play of emergencies allow participants the opportunity to practice calling for help and chest compression technique with discussion of AED use.   Hypertension: -Group verbal and written instruction that provides a basic overview of hypertension including the most recent diagnostic guidelines, risk factor reduction with self-care instructions and medication management.    Nutrition I class: Heart Healthy Eating:  -Group instruction provided by PowerPoint slides, verbal discussion, and written materials to support subject matter. The instructor gives an explanation and review of the Therapeutic Lifestyle Changes diet recommendations, which includes a discussion on lipid goals, dietary fat, sodium, fiber, plant stanol/sterol esters, sugar, and the components of a well-balanced, healthy diet.   CARDIAC REHAB PHASE II EXERCISE from 02/01/2017 in [place]  Date  01/15/17  Educator  RD  Instruction Review Code  2- meets goals/outcomes      Nutrition II class: Lifestyle Skills:  -Group instruction provided by PowerPoint slides, verbal discussion, and written materials to support subject matter. The instructor gives an explanation and review of label reading, grocery shopping for heart health, heart healthy recipe modifications, and ways to make healthier choices when eating out.   CARDIAC REHAB PHASE II EXERCISE from 02/01/2017 in [place]  Date  01/22/17  Educator  RD  Instruction Review Code  2- meets goals/outcomes      Diabetes Question & Answer:  -Group instruction provided by PowerPoint slides, verbal  discussion, and written materials to support subject matter. The instructor gives an explanation and review of diabetes co-morbidities, pre- and post-prandial blood glucose goals, pre-exercise blood glucose goals, signs, symptoms, and treatment of hypoglycemia and hyperglycemia, and foot care basics.   Diabetes Blitz:  -Group instruction provided by PowerPoint slides, verbal discussion, and written materials to support subject matter. The instructor gives an explanation and review of the physiology behind type 1 and type 2 diabetes, diabetes medications and rational behind using different medications, pre- and post-prandial blood glucose recommendations and Hemoglobin A1c goals, diabetes diet, and exercise including blood glucose guidelines for exercising safely.    Portion Distortion:  -Group instruction provided by PowerPoint slides, verbal discussion, written materials, and food models to support subject matter. The instructor  gives an explanation of serving size versus portion size, changes in portions sizes over the last 20 years, and what consists of a serving from each food group.   CARDIAC REHAB PHASE II EXERCISE from 02/01/2017 in [place]  Date  01/09/17  Educator  RD  Instruction Review Code  2- meets goals/outcomes      Stress Management:  -Group instruction provided by verbal instruction, video, and written materials to support subject matter.  Instructors review role of stress in heart disease and how to cope with stress positively.     Exercising on Your Own:  -Group instruction provided by verbal instruction, power point, and written materials to support subject.  Instructors discuss benefits of exercise, components of exercise, frequency and intensity of exercise, and end points for exercise.  Also discuss use of nitroglycerin and activating EMS.  Review options of places to exercise outside of rehab.  Review guidelines for sex with heart  disease.   CARDIAC REHAB PHASE II EXERCISE from 02/01/2017 in [place]  Date  12/05/16  Instruction Review Code  2- meets goals/outcomes      Cardiac Drugs I:  -Group instruction provided by verbal instruction and written materials to support subject.  Instructor reviews cardiac drug classes: antiplatelets, anticoagulants, beta blockers, and statins.  Instructor discusses reasons, side effects, and lifestyle considerations for each drug class.   Cardiac Drugs II:  -Group instruction provided by verbal instruction and written materials to support subject.  Instructor reviews cardiac drug classes: angiotensin converting enzyme inhibitors (ACE-I), angiotensin II receptor blockers (ARBs), nitrates, and calcium channel blockers.  Instructor discusses reasons, side effects, and lifestyle considerations for each drug class.   CARDIAC REHAB PHASE II EXERCISE from 02/01/2017 in [place]  Date  01/23/17  Instruction Review Code  2- meets goals/outcomes      Anatomy and Physiology of the Circulatory System:  Group verbal and written instruction and models provide basic cardiac anatomy and physiology, with the coronary electrical and arterial systems. Review of: AMI, Angina, Valve disease, Heart Failure, Peripheral Artery Disease, Cardiac Arrhythmia, Pacemakers, and the ICD.   Other Education:  -Group or individual verbal, written, or video instructions that support the educational goals of the cardiac rehab program.   Knowledge Questionnaire Score:     Knowledge Questionnaire Score - 11/29/16 1046      Knowledge Questionnaire Score   Pre Score 21/24      Core Components/Risk Factors/Patient Goals at Admission:     Personal Goals and Risk Factors at Admission - 11/29/16 0835      Core Components/Risk Factors/Patient Goals on Admission   Lipids Yes   Intervention Provide education and support for participant on nutrition  & aerobic/resistive exercise along with prescribed medications to achieve LDL 70mg , HDL >40mg .   Expected Outcomes Short Term: Participant states understanding of desired cholesterol values and is compliant with medications prescribed. Participant is following exercise prescription and nutrition guidelines.;Long Term: Cholesterol controlled with medications as prescribed, with individualized exercise RX and with personalized nutrition plan. Value goals: LDL < 70mg , HDL > 40 mg.   Personal Goal Other Yes   Personal Goal Short: Be able to  participate and qualify for states at senior games. Get back to swimming at [facility] in May.   Long: Be in states/national senior games   Intervention Provide exercise programming to assist with improving cardiorespiratory fitness   Expected Outcomes Pt will be able to  participate and qualify in senior games      Core Components/Risk Factors/Patient Goals Review:    Core Components/Risk Factors/Patient Goals at Discharge (Final Review):    ITP Comments:     ITP Comments    Row Name 11/29/16 0830 12/28/16 1105         ITP Comments Dr. Fransico Him, Medical Director Attended HTN class on 12/28/2016         Comments: Glendell Docker is making expected progress toward personal goals after completing 25 sessions. Recommend continued exercise and life style modification education including  stress management and relaxation techniques to decrease cardiac risk profile. Glendell Docker is enjoying participating in phase 2 cardiac rehab. Glendell Docker has been swimming and competed in the senior games last week where he won two gold  and a silver medal. Glendell Docker does not feel he is where he was before he had his open heart surgery but feels better.Barnet Pall, RN,BSN 02/04/2017 5:52 PM

## 2017-02-04 NOTE — Patient Instructions (Signed)

## 2017-02-04 NOTE — Progress Notes (Signed)
Cardiology Office Note Date:  02/06/2017   ID:  Richard Ellis Aug 15, 1946, MRN [id number]  PCP:  Cassandria Anger, MD  Cardiologist:  [name], MD    Chief Complaint  Patient presents with  . Follow-up     History of Present Illness: Richard Ellis is a 71 y.o. male who presents for Follow-up of coronary artery disease. The patient underwent stenting of the LAD in 2017. He developed recurrent angina and was found to have critical stenosis of the mid LAD at the proximal stent edge. Because of calcification and tortuosity, a stent could never be delivered to that lesion. He was ultimately sent for CABG with a LIMA to LAD and saphenous vein graft to diagonal. He also underwent surgical closure of his known PFO. His postoperative course was uncomplicated.  He's back to running for exercise. Has run in a few 5K's over the last few weeks. Feels like he continues to improve his exercise tolerance, but not back yet to where he would like. He denies exertional chest discomfort. He still has some left-sided numbness. No shortness of breath, edema, orthopnea, PND, or heart palpitations.  Past Medical History:  Diagnosis Date  . Allergy    rhinitis  . Anemia    "Hgb always on the low side" (07/02/2013)  . CAD (coronary artery disease)    a. LHC 2/17: pLAD 50, mLAD 95, pRCA 30, dRCA 25, EF 50-55% >> PCI:  3 x 18 mm Resolute DES to mLAD   . Chronic bronchitis ([place])    "not in the last year since I started taking allergy shots" (10/09/2016)  . Chronic lower back pain   . Cryptogenic stroke ([place]) 07/02/2013   a. "small ischemic occipital right sided" (07/02/2013)  //  b. Event monitor 10/14: sinus brady  //  c. Carotid US 10/14: bilat ICA 1-39%  . Esophageal stricture   . Heart murmur    "as a child" (07/02/2013)  . History of echocardiogram    a. Echo 10/14: EF 60-65%, no RWMA, normal diastolic function  . Hyperlipidemia   . Hypothyroidism   . Osteoarthritis    "lower back"  (10/09/2016)  . PFO (patent foramen ovale)    a. TEE 11/14: mild LVH, EF 60-65%, small PFO, R-L shunt    Past Surgical History:  Procedure Laterality Date  . CARDIAC CATHETERIZATION N/A 11/10/2015   Procedure: Left Heart Cath and Coronary Angiography;  Surgeon: [name], MD;  Location: [place] [facility];  Service: Cardiovascular;  Laterality: N/A;  . CARDIAC CATHETERIZATION N/A 11/10/2015   Procedure: Coronary Stent Intervention;  Surgeon: [name], MD;  Location: [place] [facility];  Service: Cardiovascular;  Laterality: N/A;  mid lad  3.0x18 resolute  . CARDIAC CATHETERIZATION  10/09/2016   "scheduled OHS for tomorrow" (10/09/2016)  . CARDIAC CATHETERIZATION N/A 10/09/2016   Procedure: Left Heart Cath and Coronary Angiography;  Surgeon: [name], MD;  Location: [place] [facility];  Service: Cardiovascular;  Laterality: N/A;  . CARDIAC CATHETERIZATION N/A 10/09/2016   Procedure: Coronary Balloon Angioplasty;  Surgeon: [name], MD;  Location: [place] [facility];  Service: Cardiovascular;  Laterality: N/A;  . CORONARY ARTERY BYPASS GRAFT N/A 10/15/2016   Procedure: CORONARY ARTERY BYPASS GRAFTING (CABG), ON PUMP, TIMES TWO, USING LEFT INTERNAL MAMMARY ARTERY AND RIGHT GREATER SAPHENOUS VEIN HARVESTED ENDOSCOPICALLY;  Surgeon: Ivin Poot, MD;  Location: [place];  Service: Open Heart Surgery;  Laterality: N/A;  LIMA to LAD, SVG to DIAGONAL  .  ESOPHAGOGASTRODUODENOSCOPY N/A 10/26/2012   Procedure: ESOPHAGOGASTRODUODENOSCOPY (EGD);  Surgeon: Winfield Cunas., MD;  Location: [facility]OSCOPY;  Service: Endoscopy;  Laterality: N/A;  . ESOPHAGOGASTRODUODENOSCOPY N/A 07/08/2013   Procedure: ESOPHAGOGASTRODUODENOSCOPY (EGD);  Surgeon: Winfield Cunas., MD;  Location: [facility];  Service: Endoscopy;  Laterality: N/A;  . INNER EAR SURGERY Left 1957   "related to ear infection"  . REPAIR OF PATENT FORAMEN OVALE N/A 10/15/2016   Procedure: REPAIR OF PATENT FORAMEN OVALE;  Surgeon:  Ivin Poot, MD;  Location: [place];  Service: Open Heart Surgery;  Laterality: N/A;  . SAVORY DILATION N/A 07/08/2013   Procedure: SAVORY DILATION;  Surgeon: Winfield Cunas., MD;  Location: [facility] ENDOS[facility];  Service: Endoscopy;  Laterality: N/A;  . TEE WITHOUT CARDIOVERSION N/A 07/28/2013   Procedure: TRANSESOPHAGEAL ECHOCARDIOGRAM (TEE);  Surgeon: Pixie Casino, MD;  Location: [facility] ENDOSCOPY;  Service: Cardiovascular;  Laterality: N/A;  . TEE WITHOUT CARDIOVERSION N/A 10/15/2016   Procedure: TRANSESOPHAGEAL ECHOCARDIOGRAM (TEE);  Surgeon: Ivin Poot, MD;  Location: [place];  Service: Open Heart Surgery;  Laterality: N/A;  . TONSILLECTOMY  1950's    Current Outpatient Prescriptions  Medication Sig Dispense Refill  . acetaminophen (TYLENOL) 500 MG tablet Take 1,000 mg by mouth 2 (two) times daily.    [name] aspirin EC 81 MG EC tablet Take 1 tablet (81 mg total) by mouth daily.    . Cholecalciferol 2000 UNITS CAPS Take 2,000 Units by mouth every evening.     . gabapentin (NEURONTIN) 100 MG capsule TAKE ONE CAPSULE BY MOUTH TWICE A DAY - office visit before next refill 60 capsule 0  . levothyroxine (SYNTHROID, LEVOTHROID) 25 MCG tablet Take 1 tablet (25 mcg total) by mouth daily before breakfast. 90 tablet 3  . Omega-3 Fatty Acids (FISH OIL) 1200 MG CAPS Take 1,200 mg by mouth 2 (two) times daily.    . Turmeric 500 MG CAPS Take 1 capsule by mouth at bedtime.    . vitamin C (ASCORBIC ACID) 500 MG tablet Take 1 tablet (500 mg total) by mouth daily. 30 tablet 0   No current facility-administered medications for this visit.     Allergies:   Atorvastatin; Crestor [rosuvastatin]; Demerol [meperidine]; Repatha [evolocumab]; and Niacin and related   Social History:  The patient  reports that he has never smoked. He has never used smokeless tobacco. He reports that he drinks about 4.8 oz of alcohol per week . He reports that he does not use drugs.   Family History:  The patient's  family history  includes Coronary artery disease in his other; Heart disease in his father.    ROS:  Please see the history of present illness.   All other systems are reviewed and negative.    PHYSICAL EXAM: VS:  BP 108/62   Pulse 66   Ht 6' 1.5" (1.867 m)   Wt 178 lb 1.9 oz (80.8 kg)   BMI 23.18 kg/m  , BMI Body mass index is 23.18 kg/m. GEN: Well nourished, well developed, in no acute distress  HEENT: normal  Neck: no JVD, no masses. No carotid bruits Cardiac: RRR without murmur or gallop                Respiratory:  clear to auscultation bilaterally, normal work of breathing GI: soft, nontender, nondistended, + BS MS: no deformity or atrophy  Ext: no pretibial edema, pedal pulses 2+= bilaterally Skin: warm and dry, no rash Neuro:  Strength and sensation are  intact Psych: euthymic mood, full affect  EKG:  EKG is not ordered today.  Recent Labs: 04/19/2016: TSH 1.88 10/11/2016: ALT 20 10/16/2016: Magnesium 2.1 10/19/2016: BUN 13; Creatinine, Ser 0.84; Hemoglobin 8.3; Platelets 153; Potassium 3.9; Sodium 135   Lipid Panel     Component Value Date/Time   CHOL 122 (L) 04/18/2016 0816   CHOL 230 (H) 11/09/2014 1200   TRIG 115 04/18/2016 0816   TRIG 152 (H) 11/09/2014 1200   TRIG 228 (HH) 07/30/2006 0741   HDL 52 04/18/2016 0816   HDL 64 11/09/2014 1200   CHOLHDL 2.3 04/18/2016 0816   VLDL 23 04/18/2016 0816   LDLCALC 47 04/18/2016 0816   LDLCALC 136 (H) 11/09/2014 1200   LDLDIRECT 122.0 05/09/2015 1006      Wt Readings from Last 3 Encounters:  02/04/17 178 lb 1.9 oz (80.8 kg)  11/29/16 177 lb 11.1 oz (80.6 kg)  11/19/16 173 lb (78.5 kg)     ASSESSMENT AND PLAN: 1.  Coronary artery disease, native vessel, without angina: The patient is doing well following CABG. He has no symptoms of angina. He will continue on his current medical program. I will see him back in one year. We specifically discussed his restrictions and at this point I think he can work out as much as he would like as  long as it is within reason. I asked him to ease back into jogging.  2. Hyperlipidemia: The patient is been intolerant to statin drugs. He is intolerant to PCSK9 inhibitors because of leg pain. Will continue with lifestyle modification. He is not able to tolerate pharmacal therapy for his hyperlipidemia.  Current medicines are reviewed with the patient today.  The patient does not have concerns regarding medicines.  Labs/ tests ordered today include:  No orders of the defined types were placed in this encounter.  Disposition:   FU one year  Signed, [name], MD  02/06/2017 11:36 AM    [facility] [address], [place], [place]  [address] Phone: [phone]; Fax: [phone]

## 2017-02-06 ENCOUNTER — Encounter (HOSPITAL_COMMUNITY)
Admission: RE | Admit: 2017-02-06 | Discharge: 2017-02-06 | Disposition: A | Discharge status: Home / Self Care | Payer: Federal, State, Local not specified - PPO | Source: Ambulatory Visit | Attending: Cardiovascular Disease | Admitting: Cardiovascular Disease

## 2017-02-06 ENCOUNTER — Encounter: Payer: Self-pay | Admitting: Internal Medicine

## 2017-02-06 DIAGNOSIS — Z951 Presence of aortocoronary bypass graft: Secondary | ICD-10-CM | POA: Diagnosis not present

## 2017-02-08 ENCOUNTER — Telehealth (HOSPITAL_COMMUNITY): Payer: Self-pay | Admitting: Internal Medicine

## 2017-02-08 ENCOUNTER — Encounter (HOSPITAL_COMMUNITY): Payer: Federal, State, Local not specified - PPO

## 2017-02-11 ENCOUNTER — Encounter (HOSPITAL_COMMUNITY): Payer: Federal, State, Local not specified - PPO

## 2017-02-13 ENCOUNTER — Encounter (HOSPITAL_COMMUNITY)
Admission: RE | Admit: 2017-02-13 | Discharge: 2017-02-13 | Disposition: A | Discharge status: Home / Self Care | Payer: Federal, State, Local not specified - PPO | Source: Ambulatory Visit | Attending: Cardiovascular Disease | Admitting: Cardiovascular Disease

## 2017-02-13 DIAGNOSIS — Z951 Presence of aortocoronary bypass graft: Secondary | ICD-10-CM | POA: Diagnosis not present

## 2017-02-15 ENCOUNTER — Encounter (HOSPITAL_COMMUNITY)
Admission: RE | Admit: 2017-02-15 | Discharge: 2017-02-15 | Disposition: A | Discharge status: Home / Self Care | Payer: Federal, State, Local not specified - PPO | Source: Ambulatory Visit | Attending: Cardiovascular Disease | Admitting: Cardiovascular Disease

## 2017-02-15 DIAGNOSIS — Z951 Presence of aortocoronary bypass graft: Secondary | ICD-10-CM | POA: Diagnosis present

## 2017-02-18 ENCOUNTER — Encounter (HOSPITAL_COMMUNITY)
Admission: RE | Admit: 2017-02-18 | Discharge: 2017-02-18 | Disposition: A | Discharge status: Home / Self Care | Payer: Federal, State, Local not specified - PPO | Source: Ambulatory Visit | Attending: Cardiovascular Disease | Admitting: Cardiovascular Disease

## 2017-02-18 DIAGNOSIS — Z951 Presence of aortocoronary bypass graft: Secondary | ICD-10-CM | POA: Diagnosis not present

## 2017-02-20 ENCOUNTER — Encounter (HOSPITAL_COMMUNITY)
Admission: RE | Admit: 2017-02-20 | Discharge: 2017-02-20 | Disposition: A | Discharge status: Home / Self Care | Payer: Federal, State, Local not specified - PPO | Source: Ambulatory Visit | Attending: Cardiovascular Disease | Admitting: Cardiovascular Disease

## 2017-02-20 DIAGNOSIS — Z951 Presence of aortocoronary bypass graft: Secondary | ICD-10-CM | POA: Diagnosis not present

## 2017-02-22 ENCOUNTER — Encounter (HOSPITAL_COMMUNITY)
Admission: RE | Admit: 2017-02-22 | Discharge: 2017-02-22 | Disposition: A | Discharge status: Home / Self Care | Payer: Federal, State, Local not specified - PPO | Source: Ambulatory Visit | Attending: Cardiovascular Disease | Admitting: Cardiovascular Disease

## 2017-02-22 DIAGNOSIS — Z951 Presence of aortocoronary bypass graft: Secondary | ICD-10-CM | POA: Diagnosis not present

## 2017-02-25 ENCOUNTER — Encounter (HOSPITAL_COMMUNITY)
Admission: RE | Admit: 2017-02-25 | Discharge: 2017-02-25 | Disposition: A | Discharge status: Home / Self Care | Payer: Federal, State, Local not specified - PPO | Source: Ambulatory Visit | Attending: Cardiovascular Disease | Admitting: Cardiovascular Disease

## 2017-02-25 DIAGNOSIS — Z951 Presence of aortocoronary bypass graft: Secondary | ICD-10-CM

## 2017-02-27 ENCOUNTER — Encounter (HOSPITAL_COMMUNITY)
Admission: RE | Admit: 2017-02-27 | Discharge: 2017-02-27 | Disposition: A | Discharge status: Home / Self Care | Payer: Federal, State, Local not specified - PPO | Source: Ambulatory Visit | Attending: Cardiovascular Disease | Admitting: Cardiovascular Disease

## 2017-02-27 DIAGNOSIS — Z951 Presence of aortocoronary bypass graft: Secondary | ICD-10-CM | POA: Diagnosis not present

## 2017-03-01 ENCOUNTER — Encounter (HOSPITAL_COMMUNITY)
Admission: RE | Admit: 2017-03-01 | Discharge: 2017-03-01 | Disposition: A | Discharge status: Home / Self Care | Payer: Federal, State, Local not specified - PPO | Source: Ambulatory Visit | Attending: Cardiovascular Disease | Admitting: Cardiovascular Disease

## 2017-03-01 DIAGNOSIS — Z951 Presence of aortocoronary bypass graft: Secondary | ICD-10-CM | POA: Diagnosis not present

## 2017-03-04 ENCOUNTER — Encounter (HOSPITAL_COMMUNITY)
Admission: RE | Admit: 2017-03-04 | Discharge: 2017-03-04 | Disposition: A | Discharge status: Home / Self Care | Payer: Federal, State, Local not specified - PPO | Source: Ambulatory Visit | Attending: Cardiovascular Disease | Admitting: Cardiovascular Disease

## 2017-03-04 DIAGNOSIS — Z951 Presence of aortocoronary bypass graft: Secondary | ICD-10-CM | POA: Diagnosis not present

## 2017-03-04 NOTE — Progress Notes (Signed)
Discharge Summary  Patient Details  Name: Richard Ellis MRN: [id number] Date of Birth: 1946/02/19 Referring Provider:     CARDIAC REHAB PHASE II ORIENTATION from 11/29/2016 in [place]  Referring Provider  Richard Nick) [name] MD       Number of Visits: 36  Reason for Discharge:  Patient independent in their exercise.  Smoking History:  History  Smoking Status  . Never Smoker  Smokeless Tobacco  . Never Used    Diagnosis:  10/15/16 S/P CABG x 2  ADL UCSD:   Initial Exercise Prescription:     Initial Exercise Prescription - 11/29/16 1000      Date of Initial Exercise RX and Referring Provider   Date (P)  11/29/16   Referring Provider Richard Nick)  [name] MD     Treadmill   MPH (P)  3.2   Grade (P)  2   Minutes (P)  10   METs (P)  4.33     Bike   Level (P)  1.2   Minutes (P)  10   METs (P)  3.85     NuStep   Level (P)  3   Minutes (P)  10   METs (P)  3.5     Prescription Details   Frequency (times per week) (P)  5   Duration (P)  Progress to 45 minutes of aerobic exercise without signs/symptoms of physical distress     Intensity   THRR 40-80% of Max Heartrate (P)  60-120   Ratings of Perceived Exertion (P)  11-13   Perceived Dyspnea (P)  0-4     Progression   Progression (P)  Continue to progress workloads to maintain intensity without signs/symptoms of physical distress.     Resistance Training   Training Prescription (P)  Yes   Weight (P)  3lb   Reps (P)  10-15      Discharge Exercise Prescription (Final Exercise Prescription Changes):     Exercise Prescription Changes - 03/14/17 1000      Response to Exercise   Blood Pressure (Admit) 120/80   Blood Pressure (Exercise) 140/70   Blood Pressure (Exit) 120/82   Heart Rate (Admit) 64 bpm   Heart Rate (Exercise) 127 bpm   Heart Rate (Exit) 58 bpm   Rating of Perceived Exertion (Exercise) 12   Duration Progress to 45 minutes of aerobic exercise without  signs/symptoms of physical distress   Intensity THRR unchanged     Progression   Progression Continue to progress workloads to maintain intensity without signs/symptoms of physical distress.   Average METs 7     Resistance Training   Training Prescription Yes   Weight 10lb   Reps 10-15     Treadmill   MPH 4   Grade 0   Minutes 10   METs 7.13     NuStep   Level 7   Minutes 10   METs 6.9     Elliptical   Level 2   Minutes 10   METs 7     Home Exercise Plan   Plans to continue exercise at Home (comment)   Frequency Add 4 additional days to program exercise sessions.   Initial Home Exercises Provided 12/12/16      Functional Capacity:     6 Minute Walk    Row Name 11/29/16 1047 03/14/17 1027       6 Minute Walk   Phase Initial Discharge    Distance 2200 feet 2571 feet  Distance % Change  - 16.9 %    Walk Time 6 minutes 6 minutes    # of Rest Breaks 0 0    MPH 4.16 4.9    METS 4.8 5.6    RPE 9 11    VO2 Peak 16.81 19.7    Symptoms No No    Resting HR 64 bpm 61 bpm    Resting BP 110/82 110/68    Max Ex. HR 94 bpm 102 bpm    Max Ex. BP 134/88 150/82    2 Minute Post BP 122/88 130/82       Psychological, QOL, Others - Outcomes: PHQ 2/9: Depression screen [facility] 2/9 03/04/2017 12/03/2016 09/29/2014  Decreased Interest 0 0 0  Down, Depressed, Hopeless 0 0 0  PHQ - 2 Score 0 0 0    Quality of Life:     Quality of Life - 03/14/17 1034      Quality of Life Scores   Health/Function Pre 24.6 %   Health/Function Post 24.17 %   Health/Function % Change -1.75 %   Socioeconomic Pre 23.57 %   Socioeconomic Post 24.07 %   Socioeconomic % Change  2.12 %   Psych/Spiritual Pre 22.86 %   Psych/Spiritual Post 22.5 %   Psych/Spiritual % Change -1.57 %   Family Pre 24.9 %   Family Post 23.4 %   Family % Change -6.02 %   GLOBAL Pre 24.07 %   GLOBAL Post 23.73 %   GLOBAL % Change -1.41 %      Personal Goals: Goals established at orientation with  interventions provided to work toward goal.     Personal Goals and Risk Factors at Admission - 11/29/16 0835      Core Components/Risk Factors/Patient Goals on Admission   Lipids Yes   Intervention Provide education and support for participant on nutrition & aerobic/resistive exercise along with prescribed medications to achieve LDL <62m, HDL >492m   Expected Outcomes Short Term: Participant states understanding of desired cholesterol values and is compliant with medications prescribed. Participant is following exercise prescription and nutrition guidelines.;Long Term: Cholesterol controlled with medications as prescribed, with individualized exercise RX and with personalized nutrition plan. Value goals: LDL < 7061mHDL > 40 mg.   Personal Goal Other Yes   Personal Goal Short: Be able to  participate and qualify for states at senior games. [facility]   Long: Be in states/national senior games   Intervention Provide exercise programming to assist with improving cardiorespiratory fitness   Expected Outcomes Pt will be able to participate and qualify in senior games       Personal Goals Discharge:   Nutrition & Weight - Outcomes:     Pre Biometrics - 11/29/16 1048      Pre Biometrics   Height '6\' 1"'  (1.854 m)   Weight 177 lb 11.1 oz (80.6 kg)   Waist Circumference 34.25 inches   Hip Circumference 38.5 inches   Waist to Hip Ratio 0.89 %   BMI (Calculated) 23.5   Triceps Skinfold 12 mm   % Body Fat 22 %   Grip Strength 44 kg   Flexibility 7.5 in   Single Leg Stand 30 seconds         Post Biometrics - 03/14/17 1032       Post  Biometrics   Weight 179 lb 7.3 oz (81.4 kg)   Waist Circumference 35.5 inches   Hip Circumference 38.5 inches  Waist to Hip Ratio 0.92 %   Triceps Skinfold 11 mm   % Body Fat 22.3 %   Grip Strength 51.5 kg   Flexibility 8 in   Single Leg Stand 30 seconds      Nutrition:     Nutrition Therapy & Goals - 11/30/16 1107       Nutrition Therapy   Diet Therapeutic Lifestyle Changes     Personal Nutrition Goals   Nutrition Goal Maintain current wt while in Cardiac Rehab     Intervention Plan   Intervention Prescribe, educate and counsel regarding individualized specific dietary modifications aiming towards targeted core components such as weight, hypertension, lipid management, diabetes, heart failure and other comorbidities.   Expected Outcomes Short Term Goal: Understand basic principles of dietary content, such as calories, fat, sodium, cholesterol and nutrients.;Long Term Goal: Adherence to prescribed nutrition plan.      Nutrition Discharge:     Nutrition Assessments - 02/25/17 1456      MEDFICTS Scores   Pre Score 21   Post Score 15   Score Difference -6      Education Questionnaire Score:     Knowledge Questionnaire Score - 03/14/17 1026      Knowledge Questionnaire Score   Post Score 24/24      Goals reviewed with patient; copy given to patient.Richard Ellis graduated from cardiac rehab program on Wednesday with completion of 36 exercise sessions in Phase II. Pt maintained good attendance and progressed nicely during his participation in rehab as evidenced by increased MET level.   Medication list reconciled. Repeat  PHQ score- 0  Richard Ellis  has made significant lifestyle changes and should be commended for his success. Richard Ellis feels he has achieved his goals during cardiac rehab.   Pt plans to continue exercise by running, swimming and jogging. We are proud of Carl's progress and accomplishments while in the program. Richard Ellis participated in the senior games and has been running/jogging both at cardiac rehab and at home. Barnet Pall, RN,BSN 03/25/2017 8:54 AM

## 2017-03-06 ENCOUNTER — Encounter (HOSPITAL_COMMUNITY)
Admission: RE | Admit: 2017-03-06 | Discharge: 2017-03-06 | Disposition: A | Discharge status: Home / Self Care | Payer: Federal, State, Local not specified - PPO | Source: Ambulatory Visit | Attending: Cardiovascular Disease | Admitting: Cardiovascular Disease

## 2017-03-06 VITALS — Wt 179.5 lb

## 2017-03-06 DIAGNOSIS — Z951 Presence of aortocoronary bypass graft: Secondary | ICD-10-CM

## 2017-03-08 ENCOUNTER — Encounter (HOSPITAL_COMMUNITY): Admission: RE | Admit: 2017-03-08 | Payer: Federal, State, Local not specified - PPO | Source: Ambulatory Visit

## 2017-03-11 ENCOUNTER — Encounter (HOSPITAL_COMMUNITY): Payer: Federal, State, Local not specified - PPO

## 2017-03-13 ENCOUNTER — Encounter (HOSPITAL_COMMUNITY): Payer: Federal, State, Local not specified - PPO

## 2017-03-15 ENCOUNTER — Encounter (HOSPITAL_COMMUNITY): Payer: Federal, State, Local not specified - PPO

## 2017-03-18 ENCOUNTER — Encounter (HOSPITAL_COMMUNITY): Payer: Federal, State, Local not specified - PPO

## 2017-03-18 DIAGNOSIS — J301 Allergic rhinitis due to pollen: Secondary | ICD-10-CM | POA: Diagnosis not present

## 2017-03-18 DIAGNOSIS — J3089 Other allergic rhinitis: Secondary | ICD-10-CM | POA: Diagnosis not present

## 2017-03-20 ENCOUNTER — Encounter (HOSPITAL_COMMUNITY): Payer: Federal, State, Local not specified - PPO

## 2017-03-22 ENCOUNTER — Encounter (HOSPITAL_COMMUNITY): Payer: Federal, State, Local not specified - PPO

## 2017-03-25 ENCOUNTER — Encounter (HOSPITAL_COMMUNITY): Payer: Federal, State, Local not specified - PPO

## 2017-03-27 ENCOUNTER — Encounter (HOSPITAL_COMMUNITY): Payer: Federal, State, Local not specified - PPO

## 2017-03-27 DIAGNOSIS — J301 Allergic rhinitis due to pollen: Secondary | ICD-10-CM | POA: Diagnosis not present

## 2017-03-27 DIAGNOSIS — J3089 Other allergic rhinitis: Secondary | ICD-10-CM | POA: Diagnosis not present

## 2017-03-29 ENCOUNTER — Encounter (HOSPITAL_COMMUNITY): Payer: Federal, State, Local not specified - PPO

## 2017-04-01 ENCOUNTER — Other Ambulatory Visit: Payer: Self-pay | Admitting: Internal Medicine

## 2017-04-01 ENCOUNTER — Encounter (HOSPITAL_COMMUNITY): Payer: Federal, State, Local not specified - PPO

## 2017-04-03 DIAGNOSIS — J301 Allergic rhinitis due to pollen: Secondary | ICD-10-CM | POA: Diagnosis not present

## 2017-04-03 DIAGNOSIS — J3089 Other allergic rhinitis: Secondary | ICD-10-CM | POA: Diagnosis not present

## 2017-04-09 ENCOUNTER — Ambulatory Visit: Payer: Medicare Other | Admitting: Family Medicine

## 2017-04-10 DIAGNOSIS — J3089 Other allergic rhinitis: Secondary | ICD-10-CM | POA: Diagnosis not present

## 2017-04-10 DIAGNOSIS — J301 Allergic rhinitis due to pollen: Secondary | ICD-10-CM | POA: Diagnosis not present

## 2017-04-11 ENCOUNTER — Ambulatory Visit (INDEPENDENT_AMBULATORY_CARE_PROVIDER_SITE_OTHER): Payer: Medicare Other | Admitting: Family Medicine

## 2017-04-11 ENCOUNTER — Encounter: Payer: Self-pay | Admitting: Family Medicine

## 2017-04-11 DIAGNOSIS — I2510 Atherosclerotic heart disease of native coronary artery without angina pectoris: Secondary | ICD-10-CM

## 2017-04-11 DIAGNOSIS — M5416 Radiculopathy, lumbar region: Secondary | ICD-10-CM | POA: Diagnosis not present

## 2017-04-11 MED ORDER — VITAMIN D (ERGOCALCIFEROL) 1.25 MG (50000 UNIT) PO CAPS: 50000.0000 [IU] | ORAL_CAPSULE | ORAL | 0 refills | Status: DC

## 2017-04-11 MED ORDER — GABAPENTIN 100 MG PO CAPS: ORAL_CAPSULE | ORAL | 3 refills | Status: DC

## 2017-04-11 NOTE — Patient Instructions (Addendum)
Good to see you.    Ice 20 minutes 2 times daily. Usually after activity and before bed. Try to limit running for now Once weekly vitamin D for 12 weeks.  Change tart cherry extract to nighttime and think maybe pills better.  Vega sport protein 1-2 times a day  Iron 65mg  daily with 500mg  of vitamin C.  If constipation occurs go to 3 times a week.  Consider running belt in the pool and try to do some intervals See me again in 4 weeks.

## 2017-04-11 NOTE — Progress Notes (Signed)
[name] [place], [place] [address] Phone: [phone] Subjective:    I'm seeing this patient by the request  of:  Plotnikov, Evie Lacks, MD   CC: Low back pain  [id number]  Richard Ellis is a 71 y.o. male coming in with complaint of low back pain. Patient is having this pain for quite some time. Was very active and running. Patient unfortunately did undergo a CABG. Since then has had difficult he started running again. Started swimming but is still complaining of low back pain. Patient has attempted running but is unable to do so secondary to the severe nature of the pain. Not as much pain going down the lateral aspect of the hip as he had previously. Seems to be more localized to the back. Some increasing tightness of hamstrings bilaterally. Denies any significant difficult he with regular daily activities but is unable to increase activity. Rates the severity of pain as 5 out of 10 and not improving      Past Medical History:  Diagnosis Date  . Allergy    rhinitis  . Anemia    "Hgb always on the low side" (07/02/2013)  . CAD (coronary artery disease)    a. LHC 2/17: pLAD 50, mLAD 95, pRCA 30, dRCA 25, EF 50-55% >> PCI:  3 x 18 mm Resolute DES to mLAD   . Chronic bronchitis ([place])    "not in the last year since I started taking allergy shots" (10/09/2016)  . Chronic lower back pain   . Cryptogenic stroke ([place]) 07/02/2013   a. "small ischemic occipital right sided" (07/02/2013)  //  b. Event monitor 10/14: sinus brady  //  c. Carotid US 10/14: bilat ICA 1-39%  . Esophageal stricture   . Heart murmur    "as a child" (07/02/2013)  . History of echocardiogram    a. Echo 10/14: EF 60-65%, no RWMA, normal diastolic function  . Hyperlipidemia   . Hypothyroidism   . Osteoarthritis    "lower back" (10/09/2016)  . PFO (patent foramen ovale)    a. TEE 11/14: mild LVH, EF 60-65%, small PFO, R-L shunt   Past Surgical History:  Procedure  Laterality Date  . CARDIAC CATHETERIZATION N/A 11/10/2015   Procedure: Left Heart Cath and Coronary Angiography;  Surgeon: [name], MD;  Location: [facility];  Service: Cardiovascular;  Laterality: N/A;  . CARDIAC CATHETERIZATION N/A 11/10/2015   Procedure: Coronary Stent Intervention;  Surgeon: [name], MD;  Location: [facility];  Service: Cardiovascular;  Laterality: N/A;  mid lad  3.0x18 resolute  . CARDIAC CATHETERIZATION  10/09/2016   "scheduled OHS for tomorrow" (10/09/2016)  . CARDIAC CATHETERIZATION N/A 10/09/2016   Procedure: Left Heart Cath and Coronary Angiography;  Surgeon: [name], MD;  Location: [facility];  Service: Cardiovascular;  Laterality: N/A;  . CARDIAC CATHETERIZATION N/A 10/09/2016   Procedure: Coronary Balloon Angioplasty;  Surgeon: [name], MD;  Location: [facility];  Service: Cardiovascular;  Laterality: N/A;  . CORONARY ARTERY BYPASS GRAFT N/A 10/15/2016   Procedure: CORONARY ARTERY BYPASS GRAFTING (CABG), ON PUMP, TIMES TWO, USING LEFT INTERNAL MAMMARY ARTERY AND RIGHT GREATER SAPHENOUS VEIN HARVESTED ENDOSCOPICALLY;  Surgeon: Ivin Poot, MD;  Location: [facility];  Service: Open Heart Surgery;  Laterality: N/A;  LIMA to LAD, SVG to DIAGONAL  . ESOPHAGOGASTRODUODENOSCOPY N/A 10/26/2012   Procedure: ESOPHAGOGASTRODUODENOSCOPY (EGD);  Surgeon: Winfield Cunas., MD;  Location: [facility];  Service: Endoscopy;  Laterality: N/A;  . ESOPHAGOGASTRODUODENOSCOPY N/A 07/08/2013   Procedure: ESOPHAGOGASTRODUODENOSCOPY (EGD);  Surgeon: Winfield Cunas., MD;  Location: [facility];  Service: Endoscopy;  Laterality: N/A;  . INNER EAR SURGERY Left 1957   "related to ear infection"  . REPAIR OF PATENT FORAMEN OVALE N/A 10/15/2016   Procedure: REPAIR OF PATENT FORAMEN OVALE;  Surgeon: Ivin Poot, MD;  Location: [facility];  Service: Open Heart Surgery;  Laterality: N/A;  . SAVORY DILATION N/A 07/08/2013   Procedure: SAVORY  DILATION;  Surgeon: Winfield Cunas., MD;  Location: [facility];  Service: Endoscopy;  Laterality: N/A;  . TEE WITHOUT CARDIOVERSION N/A 07/28/2013   Procedure: TRANSESOPHAGEAL ECHOCARDIOGRAM (TEE);  Surgeon: Pixie Casino, MD;  Location[facility] ENDOSCOPY;  Service: Cardiovascular;  Laterality: N/A;  . TEE WITHOUT CARDIOVERSION N/A 10/15/2016   Procedure: TRANSESOPHAGEAL ECHOCARDIOGRAM (TEE);  Surgeon: Ivin Poot, MD;  Location: [facility];  Service: Open Heart Surgery;  Laterality: N/A;  . TONSILLECTOMY  1950's   Social History   Social History  . Marital status: Married    Spouse name: N/A  . Number of children: 1  . Years of education: N/A   Occupational History  . Retired    Social History Main Topics  . Smoking status: Never Smoker  . Smokeless tobacco: Never Used  . Alcohol use 4.8 oz/week    4 Glasses of wine, 4 Cans of beer per week     Comment: weekly  . Drug use: No  . Sexual activity: Yes   Other Topics Concern  . None   Social History Narrative  . None   Allergies  Allergen Reactions  . Atorvastatin Other (See Comments)    Muscle weakness  . Crestor [Rosuvastatin]     Muscle weakness  . Demerol [Meperidine] Nausea Only  . Repatha [Evolocumab]     Muscle weakness  . Niacin And Related Itching, Rash and Other (See Comments)    Palms turned red and itched   Family History  Problem Relation Age of Onset  . Coronary artery disease Other        male first degree relative <60  . Heart disease Father        heart attack  . Colon cancer Neg Hx      Past medical history, social, surgical and family history all reviewed in electronic medical record.  No pertanent information unless stated regarding to the chief complaint.   Review of Systems:Review of systems updated and as accurate as of 04/11/17  No headache, visual changes, nausea, vomiting, diarrhea, constipation, dizziness, abdominal pain, skin rash, fevers, chills, night sweats, weight loss,  swollen lymph nodes, body aches, joint swelling,  chest pain, shortness of breath, mood changes.Positive muscle aches   Objective  Blood pressure 110/68, pulse (!) 50, height 6\' 1"  (1.854 m), weight 186 lb (84.4 kg), SpO2 97 %. Systems examined below as of 04/11/17   General: No apparent distress alert and oriented x3 mood and affect normal, dressed appropriately.  HEENT: Pupils equal, extraocular movements intact  Respiratory: Patient's speak in full sentences and does not appear short of breath  Cardiovascular: No lower extremity edema, non tender, no erythema  Skin: Warm dry intact with no signs of infection or rash on extremities or on axial skeleton.  Abdomen: Soft nontender  Neuro: Cranial nerves II through XII are intact, neurovascularly intact in all extremities with 2+ DTRs and 2+ pulses.  Lymph: No lymphadenopathy of posterior or anterior  cervical chain or axillae bilaterally.  Gait normal with good balance and coordination.  MSK:  Non tender with full range of motion and good stability and symmetric strength and tone of shoulders, elbows, wrist, hip, knee and ankles bilaterally. Mild arthritic changes of multiple joints Back Exam:  Inspection: Mild loss of lordosis Motion: Flexion 35 deg, Extension 25 deg, Side Bending to 35 deg bilaterally,  Rotation to 35 deg bilaterally  SLR laying: Negative  XSLR laying: Negative  Palpable tenderness: Patient does have some tenderness over the sacroiliac joint left greater then right. Some mild pain in the paraspinal musculature as well. FABER: negative. Sensory change: Gross sensation intact to all lumbar and sacral dermatomes.  Reflexes: 2+ at both patellar tendons, 2+ at achilles tendons, Babinski's downgoing.  Strength at foot  Plantar-flexion: 5/5 Dorsi-flexion: 5/5 Eversion: 5/5 Inversion: 5/5  Leg strength  Quad: 5/5 Hamstring: 5/5 Hip flexor: 5/5 Hip abductors: 4/5  Gait unremarkable.  Procedure note 11941; 15 minutes spent for  Therapeutic exercises as stated in above notes.  This included exercises focusing on stretching, strengthening, with significant focus on eccentric aspects.  Low back exercises that included:  Pelvic tilt/bracing instruction to focus on control of the pelvic girdle and lower abdominal muscles  Glute strengthening exercises, focusing on proper firing of the glutes without engaging the low back muscles Proper stretching techniques for maximum relief for the hamstrings, hip flexors, low back and some rotation where tolerated Goal Is to return patient to running 2-3 times a week.  Proper technique shown and discussed handout in great detail with ATC.  All questions were discussed and answered.     Impression and Recommendations:     This case required medical decision making of moderate complexity.      Note: This dictation was prepared with Dragon dictation along with smaller phrase technology. Any transcriptional errors that result from this process are unintentional.

## 2017-04-11 NOTE — Assessment & Plan Note (Signed)
Patient is not having any significant lumbar radicular symptoms that is having lumbar pain. Patient given home exercises today. Refilled gabapentin and seems to be helping somewhat. Once weekly vitamin D to help with strengthening endurance. Patient also encouraged to take iron supplementation with him having difficulty with this in the past could be contributing to some of the muscle fatigue. We discussed icing regimen. Follow-up again with me in 4 weeks.

## 2017-04-22 DIAGNOSIS — J301 Allergic rhinitis due to pollen: Secondary | ICD-10-CM | POA: Diagnosis not present

## 2017-04-22 DIAGNOSIS — J3089 Other allergic rhinitis: Secondary | ICD-10-CM | POA: Diagnosis not present

## 2017-04-29 DIAGNOSIS — J301 Allergic rhinitis due to pollen: Secondary | ICD-10-CM | POA: Diagnosis not present

## 2017-04-29 DIAGNOSIS — J3089 Other allergic rhinitis: Secondary | ICD-10-CM | POA: Diagnosis not present

## 2017-04-30 ENCOUNTER — Other Ambulatory Visit: Payer: Self-pay | Admitting: Internal Medicine

## 2017-05-06 DIAGNOSIS — J301 Allergic rhinitis due to pollen: Secondary | ICD-10-CM | POA: Diagnosis not present

## 2017-05-06 DIAGNOSIS — J3089 Other allergic rhinitis: Secondary | ICD-10-CM | POA: Diagnosis not present

## 2017-05-09 ENCOUNTER — Encounter: Payer: Self-pay | Admitting: Family Medicine

## 2017-05-09 ENCOUNTER — Ambulatory Visit (INDEPENDENT_AMBULATORY_CARE_PROVIDER_SITE_OTHER): Payer: Medicare Other | Admitting: Family Medicine

## 2017-05-09 ENCOUNTER — Other Ambulatory Visit (INDEPENDENT_AMBULATORY_CARE_PROVIDER_SITE_OTHER): Payer: Medicare Other

## 2017-05-09 VITALS — BP 128/74 | HR 48 | Ht 73.5 in | Wt 182.0 lb

## 2017-05-09 DIAGNOSIS — M5416 Radiculopathy, lumbar region: Secondary | ICD-10-CM

## 2017-05-09 DIAGNOSIS — M2550 Pain in unspecified joint: Secondary | ICD-10-CM

## 2017-05-09 DIAGNOSIS — E039 Hypothyroidism, unspecified: Secondary | ICD-10-CM

## 2017-05-09 DIAGNOSIS — E559 Vitamin D deficiency, unspecified: Secondary | ICD-10-CM | POA: Diagnosis not present

## 2017-05-09 DIAGNOSIS — D500 Iron deficiency anemia secondary to blood loss (chronic): Secondary | ICD-10-CM | POA: Diagnosis not present

## 2017-05-09 DIAGNOSIS — I2510 Atherosclerotic heart disease of native coronary artery without angina pectoris: Secondary | ICD-10-CM

## 2017-05-09 LAB — CBC WITH DIFFERENTIAL/PLATELET
Basophils Absolute: 0 10*3/uL (ref 0.0–0.1)
Basophils Relative: 0.9 % (ref 0.0–3.0)
Eosinophils Absolute: 0.2 10*3/uL (ref 0.0–0.7)
Eosinophils Relative: 4.6 % (ref 0.0–5.0)
HCT: 41.8 % (ref 39.0–52.0)
Hemoglobin: 14 g/dL (ref 13.0–17.0)
Lymphocytes Relative: 30.3 % (ref 12.0–46.0)
Lymphs Abs: 1.4 10*3/uL (ref 0.7–4.0)
MCHC: 33.5 g/dL (ref 30.0–36.0)
MCV: 94.8 fl (ref 78.0–100.0)
Monocytes Absolute: 0.5 10*3/uL (ref 0.1–1.0)
Monocytes Relative: 11.3 % (ref 3.0–12.0)
Neutro Abs: 2.4 10*3/uL (ref 1.4–7.7)
Neutrophils Relative %: 52.9 % (ref 43.0–77.0)
Platelets: 190 10*3/uL (ref 150.0–400.0)
RBC: 4.41 Mil/uL (ref 4.22–5.81)
RDW: 14.2 % (ref 11.5–15.5)
WBC: 4.6 10*3/uL (ref 4.0–10.5)

## 2017-05-09 LAB — SEDIMENTATION RATE: Sed Rate: 9 mm/hr (ref 0–20)

## 2017-05-09 LAB — TSH: TSH: 2.28 u[IU]/mL (ref 0.35–4.50)

## 2017-05-09 LAB — IBC PANEL
Iron: 192 ug/dL — ABNORMAL HIGH (ref 42–165)
Saturation Ratios: 57.4 % — ABNORMAL HIGH (ref 20.0–50.0)
Transferrin: 239 mg/dL (ref 212.0–360.0)

## 2017-05-09 LAB — C-REACTIVE PROTEIN: CRP: 0.2 mg/dL — ABNORMAL LOW (ref 0.5–20.0)

## 2017-05-09 LAB — VITAMIN D 25 HYDROXY (VIT D DEFICIENCY, FRACTURES): VITD: 52.03 ng/mL (ref 30.00–100.00)

## 2017-05-09 NOTE — Patient Instructions (Addendum)
Good to see you  Richard Ellis is your friend.  We will get labs downstairs today and make sure we are not missing something and recheck iron I also will have you get the epidural in the back and I expect that to help as well.  Good luck with the 5K See me again 2 weeks AFTER the epidural and we will make sure you are doing well.

## 2017-05-09 NOTE — Progress Notes (Unsigned)
Perfect testosterone. More

## 2017-05-09 NOTE — Assessment & Plan Note (Signed)
Continue to monitor

## 2017-05-09 NOTE — Progress Notes (Signed)
[name] [place],  [address] Phone: [phone] Subjective:    I'm seeing this patient by the request  of:    CC:   [id number]  Richard Ellis is a 71 y.o. male coming in with complaint of back pain. He is having mostly soreness in the hips since his last visit. No worse but no better.   Onset-  Location Duration-  Character- Aggravating factors- Reliving factors-  Therapies tried-  Severity-     Past Medical History:  Diagnosis Date  . Allergy    rhinitis  . Anemia    "Hgb always on the low side" (07/02/2013)  . CAD (coronary artery disease)    a. LHC 2/17: pLAD 50, mLAD 95, pRCA 30, dRCA 25, EF 50-55% >> PCI:  3 x 18 mm Resolute DES to mLAD   . Chronic bronchitis ([place])    "not in the last year since I started taking allergy shots" (10/09/2016)  . Chronic lower back pain   . Cryptogenic stroke ([place]) 07/02/2013   a. "small ischemic occipital right sided" (07/02/2013)  //  b. Event monitor 10/14: sinus brady  //  c. Carotid US 10/14: bilat ICA 1-39%  . Esophageal stricture   . Heart murmur    "as a child" (07/02/2013)  . History of echocardiogram    a. Echo 10/14: EF 60-65%, no RWMA, normal diastolic function  . Hyperlipidemia   . Hypothyroidism   . Osteoarthritis    "lower back" (10/09/2016)  . PFO (patent foramen ovale)    a. TEE 11/14: mild LVH, EF 60-65%, small PFO, R-L shunt   Past Surgical History:  Procedure Laterality Date  . CARDIAC CATHETERIZATION N/A 11/10/2015   Procedure: Left Heart Cath and Coronary Angiography;  Surgeon: [name], MD;  Location: [facility];  Service: Cardiovascular;  Laterality: N/A;  . CARDIAC CATHETERIZATION N/A 11/10/2015   Procedure: Coronary Stent Intervention;  Surgeon: [name], MD;  Location: [facility];  Service: Cardiovascular;  Laterality: N/A;  mid lad  3.0x18 resolute  . CARDIAC CATHETERIZATION  10/09/2016   "scheduled OHS for tomorrow"  (10/09/2016)  . CARDIAC CATHETERIZATION N/A 10/09/2016   Procedure: Left Heart Cath and Coronary Angiography;  Surgeon: [name], MD;  Location: [facility];  Service: Cardiovascular;  Laterality: N/A;  . CARDIAC CATHETERIZATION N/A 10/09/2016   Procedure: Coronary Balloon Angioplasty;  Surgeon: [name], MD;  Location: [facility];  Service: Cardiovascular;  Laterality: N/A;  . CORONARY ARTERY BYPASS GRAFT N/A 10/15/2016   Procedure: CORONARY ARTERY BYPASS GRAFTING (CABG), ON PUMP, TIMES TWO, USING LEFT INTERNAL MAMMARY ARTERY AND RIGHT GREATER SAPHENOUS VEIN HARVESTED ENDOSCOPICALLY;  Surgeon: Ivin Poot, MD;  Location: [facility];  Service: Open Heart Surgery;  Laterality: N/A;  LIMA to LAD, SVG to DIAGONAL  . ESOPHAGOGASTRODUODENOSCOPY N/A 10/26/2012   Procedure: ESOPHAGOGASTRODUODENOSCOPY (EGD);  Surgeon: Winfield Cunas., MD;  Location: [facility] ENDOSCOPY;  Service: Endoscopy;  Laterality: N/A;  . ESOPHAGOGASTRODUODENOSCOPY N/A 07/08/2013   Procedure: ESOPHAGOGASTRODUODENOSCOPY (EGD);  Surgeon: Winfield Cunas., MD;  Location: [facility];  Service: Endoscopy;  Laterality: N/A;  . INNER EAR SURGERY Left 1957   "related to ear infection"  . REPAIR OF PATENT FORAMEN OVALE N/A 10/15/2016   Procedure: REPAIR OF PATENT FORAMEN OVALE;  Surgeon: Ivin Poot, MD;  Location: [facility];  Service: Open Heart Surgery;  Laterality: N/A;  . SAVORY DILATION N/A 07/08/2013  Procedure: SAVORY DILATION;  Surgeon: Winfield Cunas., MD;  Location: [facility];  Service: Endoscopy;  Laterality: N/A;  . TEE WITHOUT CARDIOVERSION N/A 07/28/2013   Procedure: TRANSESOPHAGEAL ECHOCARDIOGRAM (TEE);  Surgeon: Pixie Casino, MD;  Location: [facility] ENDOSCOPY;  Service: Cardiovascular;  Laterality: N/A;  . TEE WITHOUT CARDIOVERSION N/A 10/15/2016   Procedure: TRANSESOPHAGEAL ECHOCARDIOGRAM (TEE);  Surgeon: Ivin Poot, MD;  Location: [facility] [facility];  Service: Open Heart Surgery;  Laterality: N/A;  .  TONSILLECTOMY  1950's   Social History   Social History  . Marital status: Married    Spouse name: N/A  . Number of children: 1  . Years of education: N/A   Occupational History  . Retired    Social History Main Topics  . Smoking status: Never Smoker  . Smokeless tobacco: Never Used  . Alcohol use 4.8 oz/week    4 Glasses of wine, 4 Cans of beer per week     Comment: weekly  . Drug use: No  . Sexual activity: Yes   Other Topics Concern  . Not on file   Social History Narrative  . No narrative on file   Allergies  Allergen Reactions  . Atorvastatin Other (See Comments)    Muscle weakness  . Crestor [Rosuvastatin]     Muscle weakness  . Demerol [Meperidine] Nausea Only  . Repatha [Evolocumab]     Muscle weakness  . Niacin And Related Itching, Rash and Other (See Comments)    Palms turned red and itched   Family History  Problem Relation Age of Onset  . Coronary artery disease Other        male first degree relative <60  . Heart disease Father        heart attack  . Colon cancer Neg Hx      Past medical history, social, surgical and family history all reviewed in electronic medical record.  No pertanent information unless stated regarding to the chief complaint.   Review of Systems:Review of systems updated and as accurate as of 05/09/17  No headache, visual changes, nausea, vomiting, diarrhea, constipation, dizziness, abdominal pain, skin rash, fevers, chills, night sweats, weight loss, swollen lymph nodes, body aches, joint swelling, muscle aches, chest pain, shortness of breath, mood changes.   Objective  There were no vitals taken for this visit. Systems examined below as of 05/09/17   General: No apparent distress alert and oriented x3 mood and affect normal, dressed appropriately.  HEENT: Pupils equal, extraocular movements intact  Respiratory: Patient's speak in full sentences and does not appear short of breath  Cardiovascular: No lower extremity  edema, non tender, no erythema  Skin: Warm dry intact with no signs of infection or rash on extremities or on axial skeleton.  Abdomen: Soft nontender  Neuro: Cranial nerves II through XII are intact, neurovascularly intact in all extremities with 2+ DTRs and 2+ pulses.  Lymph: No lymphadenopathy of posterior or anterior cervical chain or axillae bilaterally.  Gait normal with good balance and coordination.  MSK:  Non tender with full range of motion and good stability and symmetric strength and tone of shoulders, elbows, wrist, hip, knee and ankles bilaterally.     Impression and Recommendations:     This case required medical decision making of moderate complexity.      Note: This dictation was prepared with Dragon dictation along with smaller phrase technology. Any transcriptional errors that result from this process are unintentional.

## 2017-05-09 NOTE — Progress Notes (Signed)
[name] [place], [place] [address] Phone: [phone] Subjective:    I'm seeing this patient by the request  of:  Plotnikov, Evie Lacks, MD   CC: Low back pain f/u  [id number]  Richard Ellis is a 71 y.o. male coming in with complaint of low back pain. Patient is having this pain for quite some time. Was very active and running. Patient unfortunately did undergo a CABG. Patient did see me and was to start some home exercises as well as icing regimen. We restarted patient's gabapentin. Patient states unfortunately not making of significant improvement at this moment. Patient still having pain. Can only run approximately 2 miles before he has to stop. Patient is having more of an aching sensation that seems to be more of the hips bilaterally. Does have known L4 nerve root impingement previously.  MRI taken February 2017 did show that and did have some mild spinal stenosis.Impingement noted on L4 nerve root    Past Medical History:  Diagnosis Date  . Allergy    rhinitis  . Anemia    "Hgb always on the low side" (07/02/2013)  . CAD (coronary artery disease)    a. LHC 2/17: pLAD 50, mLAD 95, pRCA 30, dRCA 25, EF 50-55% >> PCI:  3 x 18 mm Resolute DES to mLAD   . Chronic bronchitis ([place])    "not in the last year since I started taking allergy shots" (10/09/2016)  . Chronic lower back pain   . Cryptogenic stroke ([place]) 07/02/2013   a. "small ischemic occipital right sided" (07/02/2013)  //  b. Event monitor 10/14: sinus brady  //  c. Carotid US 10/14: bilat ICA 1-39%  . Esophageal stricture   . Heart murmur    "as a child" (07/02/2013)  . History of echocardiogram    a. Echo 10/14: EF 60-65%, no RWMA, normal diastolic function  . Hyperlipidemia   . Hypothyroidism   . Osteoarthritis    "lower back" (10/09/2016)  . PFO (patent foramen ovale)    a. TEE 11/14: mild LVH, EF 60-65%, small PFO, R-L shunt   Past Surgical History:  Procedure  Laterality Date  . CARDIAC CATHETERIZATION N/A 11/10/2015   Procedure: Left Heart Cath and Coronary Angiography;  Surgeon: [name], MD;  Location: [facility];  Service: Cardiovascular;  Laterality: N/A;  . CARDIAC CATHETERIZATION N/A 11/10/2015   Procedure: Coronary Stent Intervention;  Surgeon: [name], MD;  Location: [facility];  Service: Cardiovascular;  Laterality: N/A;  mid lad  3.0x18 resolute  . CARDIAC CATHETERIZATION  10/09/2016   "scheduled OHS for tomorrow" (10/09/2016)  . CARDIAC CATHETERIZATION N/A 10/09/2016   Procedure: Left Heart Cath and Coronary Angiography;  Surgeon: [name], MD;  Location: [facility];  Service: Cardiovascular;  Laterality: N/A;  . CARDIAC CATHETERIZATION N/A 10/09/2016   Procedure: Coronary Balloon Angioplasty;  Surgeon: [name], MD;  Location: [facility];  Service: Cardiovascular;  Laterality: N/A;  . CORONARY ARTERY BYPASS GRAFT N/A 10/15/2016   Procedure: CORONARY ARTERY BYPASS GRAFTING (CABG), ON PUMP, TIMES TWO, USING LEFT INTERNAL MAMMARY ARTERY AND RIGHT GREATER SAPHENOUS VEIN HARVESTED ENDOSCOPICALLY;  Surgeon: Ivin Poot, MD;  Location: [facility];  Service: Open Heart Surgery;  Laterality: N/A;  LIMA to LAD, SVG to DIAGONAL  . ESOPHAGOGASTRODUODENOSCOPY N/A 10/26/2012   Procedure: ESOPHAGOGASTRODUODENOSCOPY (EGD);  Surgeon: Winfield Cunas., MD;  Location: [facility];  Service: Endoscopy;  Laterality:  N/A;  . ESOPHAGOGASTRODUODENOSCOPY N/A 07/08/2013   Procedure: ESOPHAGOGASTRODUODENOSCOPY (EGD);  Surgeon: Winfield Cunas., MD;  Location: [facility];  Service: Endoscopy;  Laterality: N/A;  . INNER EAR SURGERY Left 1957   "related to ear infection"  . REPAIR OF PATENT FORAMEN OVALE N/A 10/15/2016   Procedure: REPAIR OF PATENT FORAMEN OVALE;  Surgeon: Ivin Poot, MD;  Location: [facility];  Service: Open Heart Surgery;  Laterality: N/A;  . SAVORY DILATION N/A 07/08/2013   Procedure: SAVORY  DILATION;  Surgeon: Winfield Cunas., MD;  Location: [facility];  Service: Endoscopy;  Laterality: N/A;  . TEE WITHOUT CARDIOVERSION N/A 07/28/2013   Procedure: TRANSESOPHAGEAL ECHOCARDIOGRAM (TEE);  Surgeon: Pixie Casino, MD;  Location[facility];  Service: Cardiovascular;  Laterality: N/A;  . TEE WITHOUT CARDIOVERSION N/A 10/15/2016   Procedure: TRANSESOPHAGEAL ECHOCARDIOGRAM (TEE);  Surgeon: Ivin Poot, MD;  Location: [facility];  Service: Open Heart Surgery;  Laterality: N/A;  . TONSILLECTOMY  1950's   Social History   Social History  . Marital status: Married    Spouse name: N/A  . Number of children: 1  . Years of education: N/A   Occupational History  . Retired    Social History Main Topics  . Smoking status: Never Smoker  . Smokeless tobacco: Never Used  . Alcohol use 4.8 oz/week    4 Glasses of wine, 4 Cans of beer per week     Comment: weekly  . Drug use: No  . Sexual activity: Yes   Other Topics Concern  . None   Social History Narrative  . None   Allergies  Allergen Reactions  . Atorvastatin Other (See Comments)    Muscle weakness  . Crestor [Rosuvastatin]     Muscle weakness  . Demerol [Meperidine] Nausea Only  . Repatha [Evolocumab]     Muscle weakness  . Niacin And Related Itching, Rash and Other (See Comments)    Palms turned red and itched   Family History  Problem Relation Age of Onset  . Coronary artery disease Other        male first degree relative <60  . Heart disease Father        heart attack  . Colon cancer Neg Hx      Past medical history, social, surgical and family history all reviewed in electronic medical record.  No pertanent information unless stated regarding to the chief complaint.   Review of Systems: No headache, visual changes, nausea, vomiting, diarrhea, constipation, dizziness, abdominal pain, skin rash, fevers, chills, night sweats, weight loss, swollen lymph nodes, body aches, joint swelling, chest pain,  shortness of breath, mood changes.   positive muscle aches  Objective  Blood pressure 128/74, pulse (!) 48, height 6' 1.5" (1.867 m), weight 182 lb (82.6 kg).   Systems examined below as of 05/09/17 General: NAD A&O x3 mood, affect normal  HEENT: Pupils equal, extraocular movements intact no nystagmus Respiratory: not short of breath at rest or with speaking Cardiovascular: No lower extremity edema, non tender Skin: Warm dry intact with no signs of infection or rash on extremities or on axial skeleton. Abdomen: Soft nontender, no masses Neuro: Cranial nerves  intact, neurovascularly intact in all extremities with 2+ DTRs and 2+ pulses. Lymph: No lymphadenopathy appreciated today  Gait normal with good balance and coordination.  Gait normal with good balance and coordination.  MSK:  Non tender with full range of motion and good stability and symmetric strength and tone of  shoulders, elbows, wrist, hip, knee and ankles bilaterally. Mild arthritic changes of multiple joints Back Exam:  Inspection: mild loss in lordosis Motion: Flexion 45 deg, Extension 25 deg, Side Bending to 45 deg bilaterally,  Rotation to 45 deg bilaterally  SLR laying: Negative  XSLR laying: Negative  Palpable tenderness: tender to palpation.more in the piriformis musculature lumbar spine FABER: tightness bilaterally. Sensory change: Gross sensation intact to all lumbar and sacral dermatomes.  Reflexes: 2+ at both patellar tendons, 2+ at achilles tendons, Babinski's downgoing.  Strength at foot  Plantar-flexion: 5/5 Dorsi-flexion: 5/5 Eversion: 5/5 Inversion: 5/5  Leg strength  Quad: 5/5 Hamstring: 5/5 Hip flexor: 5/5 Hip abductors: 5/5  Gait unremarkable.      Impression and Recommendations:     This case required medical decision making of moderate complexity.      Note: This dictation was prepared with Dragon dictation along with smaller phrase technology. Any transcriptional errors that result from  this process are unintentional.

## 2017-05-09 NOTE — Assessment & Plan Note (Signed)
Believe the patient is having some recurrent symptoms. Likely some mild spinal stenosis. Epidural ordered today that I think will be beneficial. We discussed icing regimen,home exercises,patient will follow-up 2 weeks afterwards. We will get laboratory workup for more of a chronic fatigue.

## 2017-05-09 NOTE — Assessment & Plan Note (Signed)
Patient has had muscle aches before. Does have known anemia. We will recheck to see if patient's hemoglobin is staying steady. Possibly need iron supplementation. Patient continued to do other workouts. Follow-up with me again in 4 weeks

## 2017-05-10 LAB — CALCIUM, IONIZED: Calcium, Ion: 5.4 mg/dL (ref 4.8–5.6)

## 2017-05-10 LAB — PTH, INTACT AND CALCIUM
Calcium: 9.5 mg/dL (ref 8.6–10.3)
PTH: 30 pg/mL (ref 14–64)

## 2017-05-12 LAB — TESTOSTERONE, FREE, TOTAL, SHBG
Sex Hormone Binding: 128.3 nmol/L — ABNORMAL HIGH (ref 19.3–76.4)
Testosterone, Free: 5.1 pg/mL — ABNORMAL LOW (ref 6.6–18.1)
Testosterone: 817 ng/dL (ref 264–916)

## 2017-05-15 DIAGNOSIS — J3089 Other allergic rhinitis: Secondary | ICD-10-CM | POA: Diagnosis not present

## 2017-05-15 DIAGNOSIS — J301 Allergic rhinitis due to pollen: Secondary | ICD-10-CM | POA: Diagnosis not present

## 2017-05-21 ENCOUNTER — Ambulatory Visit
Admission: RE | Admit: 2017-05-21 | Discharge: 2017-05-21 | Disposition: A | Discharge status: Home / Self Care | Payer: Federal, State, Local not specified - PPO | Source: Ambulatory Visit | Attending: Family Medicine | Admitting: Family Medicine

## 2017-05-21 DIAGNOSIS — M545 Low back pain: Secondary | ICD-10-CM | POA: Diagnosis not present

## 2017-05-21 DIAGNOSIS — M5416 Radiculopathy, lumbar region: Secondary | ICD-10-CM

## 2017-05-21 MED ORDER — METHYLPREDNISOLONE ACETATE 40 MG/ML INJ SUSP (RADIOLOG
120.0000 mg | Freq: Once | INTRAMUSCULAR | Status: AC
  Administered 2017-05-21: 120 mg via EPIDURAL

## 2017-05-21 MED ORDER — IOPAMIDOL (ISOVUE-M 200) INJECTION 41%
1.0000 mL | Freq: Once | INTRAMUSCULAR | Status: AC
  Administered 2017-05-21: 1 mL via EPIDURAL

## 2017-05-21 NOTE — Discharge Instructions (Signed)

## 2017-05-22 DIAGNOSIS — H2513 Age-related nuclear cataract, bilateral: Secondary | ICD-10-CM | POA: Diagnosis not present

## 2017-05-22 DIAGNOSIS — J3089 Other allergic rhinitis: Secondary | ICD-10-CM | POA: Diagnosis not present

## 2017-05-22 DIAGNOSIS — J301 Allergic rhinitis due to pollen: Secondary | ICD-10-CM | POA: Diagnosis not present

## 2017-05-28 ENCOUNTER — Encounter: Payer: Self-pay | Admitting: Internal Medicine

## 2017-05-28 ENCOUNTER — Ambulatory Visit (INDEPENDENT_AMBULATORY_CARE_PROVIDER_SITE_OTHER): Payer: Medicare Other | Admitting: Internal Medicine

## 2017-05-28 VITALS — BP 124/78 | HR 54 | Temp 97.8°F | Ht 73.5 in | Wt 185.0 lb

## 2017-05-28 DIAGNOSIS — N320 Bladder-neck obstruction: Secondary | ICD-10-CM

## 2017-05-28 DIAGNOSIS — E782 Mixed hyperlipidemia: Secondary | ICD-10-CM

## 2017-05-28 DIAGNOSIS — Z0000 Encounter for general adult medical examination without abnormal findings: Secondary | ICD-10-CM | POA: Diagnosis not present

## 2017-05-28 MED ORDER — LEVOTHYROXINE SODIUM 25 MCG PO TABS: ORAL_TABLET | ORAL | 3 refills | Status: DC

## 2017-05-28 NOTE — Patient Instructions (Addendum)
Saw [facility] for prostate Gluten free trial (no wheat products) for 4-6 weeks. OK to use gluten-free bread and gluten-free pasta.      Health Maintenance, Male  A healthy lifestyle and preventive care is important for your health and wellness. Ask your health care provider about what schedule of regular examinations is right for you. What should I know about weight and diet? Eat a Healthy Diet  Eat plenty of vegetables, fruits, whole grains, low-fat dairy products, and lean protein.  Do not eat a lot of foods high in solid fats, added sugars, or salt.  Maintain a Healthy Weight Regular exercise can help you achieve or maintain a healthy weight. You should:  Do at least 150 minutes of exercise each week. The exercise should increase your heart rate and make you sweat (moderate-intensity exercise).  Do strength-training exercises at least twice a week.  Watch Your Levels of Cholesterol and Blood Lipids  Have your blood tested for lipids and cholesterol every 5 years starting at 71 years of age. If you are at high risk for heart disease, you should start having your blood tested when you are 71 years old. You may need to have your cholesterol levels checked more often if: ? Your lipid or cholesterol levels are high. ? You are older than 71 years of age. ? You are at high risk for heart disease.  What should I know about cancer screening? Many types of cancers can be detected early and may often be prevented. Lung Cancer  You should be screened every year for lung cancer if: ? You are a current smoker who has smoked for at least 30 years. ? You are a former smoker who has quit within the past 15 years.  Talk to your health care provider about your screening options, when you should start screening, and how often you should be screened.  Colorectal Cancer  Routine colorectal cancer screening usually begins at 71 years of age and should be repeated every 5-10 years until you are  71 years old. You may need to be screened more often if early forms of precancerous polyps or small growths are found. Your health care provider may recommend screening at an earlier age if you have risk factors for colon cancer.  Your health care provider may recommend using home test kits to check for hidden blood in the stool.  A small camera at the end of a tube can be used to examine your colon (sigmoidoscopy or colonoscopy). This checks for the earliest forms of colorectal cancer.  Prostate and Testicular Cancer  Depending on your age and overall health, your health care provider may do certain tests to screen for prostate and testicular cancer.  Talk to your health care provider about any symptoms or concerns you have about testicular or prostate cancer.  Skin Cancer  Check your skin from head to toe regularly.  Tell your health care provider about any new moles or changes in moles, especially if: ? There is a change in a mole's size, shape, or color. ? You have a mole that is larger than a pencil eraser.  Always use sunscreen. Apply sunscreen liberally and repeat throughout the day.  Protect yourself by wearing long sleeves, pants, a wide-brimmed hat, and sunglasses when outside.  What should I know about heart disease, diabetes, and high blood pressure?  If you are 54-62 years of age, have your blood pressure checked every 3-5 years. If you are 85 years of age or  older, have your blood pressure checked every year. You should have your blood pressure measured twice-once when you are at a hospital or clinic, and once when you are not at a hospital or clinic. Record the average of the two measurements. To check your blood pressure when you are not at a hospital or clinic, you can use: ? An automated blood pressure machine at a pharmacy. ? A home blood pressure monitor.  Talk to your health care provider about your target blood pressure.  If you are between 58-66 years old, ask  your health care provider if you should take aspirin to prevent heart disease.  Have regular diabetes screenings by checking your fasting blood sugar level. ? If you are at a normal weight and have a low risk for diabetes, have this test once every three years after the age of 22. ? If you are overweight and have a high risk for diabetes, consider being tested at a younger age or more often.  A one-time screening for abdominal aortic aneurysm (AAA) by ultrasound is recommended for men aged 62-75 years who are current or former smokers. What should I know about preventing infection? Hepatitis B If you have a higher risk for hepatitis B, you should be screened for this virus. Talk with your health care provider to find out if you are at risk for hepatitis B infection. Hepatitis C Blood testing is recommended for:  Everyone born from [birth year] through 1965.  Anyone with known risk factors for hepatitis C.  Sexually Transmitted Diseases (STDs)  You should be screened each year for STDs including gonorrhea and chlamydia if: ? You are sexually active and are younger than 71 years of age. ? You are older than 71 years of age and your health care provider tells you that you are at risk for this type of infection. ? Your sexual activity has changed since you were last screened and you are at an increased risk for chlamydia or gonorrhea. Ask your health care provider if you are at risk.  Talk with your health care provider about whether you are at high risk of being infected with HIV. Your health care provider may recommend a prescription medicine to help prevent HIV infection.  What else can I do?  Schedule regular health, dental, and eye exams.  Stay current with your vaccines (immunizations).  Do not use any tobacco products, such as cigarettes, chewing tobacco, and e-cigarettes. If you need help quitting, ask your health care provider.  Limit alcohol intake to no more than 2 drinks per day.  One drink equals 12 ounces of beer, 5 ounces of wine, or 1 ounces of hard liquor.  Do not use street drugs.  Do not share needles.  Ask your health care provider for help if you need support or information about quitting drugs.  Tell your health care provider if you often feel depressed.  Tell your health care provider if you have ever been abused or do not feel safe at home. This information is not intended to replace advice given to you by your health care provider. Make sure you discuss any questions you have with your health care provider. Document Released: 03/01/2008 Document Revised: 05/02/2016 Document Reviewed: 06/07/2015 Elsevier Interactive Patient Education  [employer].

## 2017-05-28 NOTE — Assessment & Plan Note (Signed)
Here for medicare wellness/physical  Diet: heart healthy  Physical activity: not sedentary  Depression/mood screen: negative  Hearing: intact to whispered voice  Visual acuity: grossly normal, performs annual eye exam  ADLs: capable  Fall risk: low to none  Home safety: good  Cognitive evaluation: intact to orientation, naming, recall and repetition  EOL planning: adv directives, full code/ I agree  I have personally reviewed and have noted  1. The patient's medical, surgical and social history  2. Their use of alcohol, tobacco or illicit drugs  3. Their current medications and supplements  4. The patient's functional ability including ADL's, fall risks, home safety risks and hearing or visual impairment.  5. Diet and physical activities  6. Evidence for depression or mood disorders 7. The roster of all physicians providing medical care to patient - is listed in the Snapshot section of the chart and reviewed today.    Today patient counseled on age appropriate routine health concerns for screening and prevention, each reviewed and up to date or declined. Immunizations reviewed and up to date or declined. Labs ordered and reviewed. Risk factors for depression reviewed and negative. Hearing function and visual acuity are intact. ADLs screened and addressed as needed. Functional ability and level of safety reviewed and appropriate. Education, counseling and referrals performed based on assessed risks today. Patient provided with a copy of personalized plan for preventive services.   He thinks he had Zostavax Prevnar

## 2017-05-28 NOTE — Progress Notes (Signed)
Subjective:  Patient ID: Richard Ellis, male    DOB: 1946/06/23  Age: 71 y.o. MRN: [id number]  CC: No chief complaint on file.   HPI CHAZZ PHILSON presents for a well exam C/o LBP - had epidural last Tue  Outpatient Medications Prior to Visit  Medication Sig Dispense Refill  . aspirin EC 81 MG EC tablet Take 1 tablet (81 mg total) by mouth daily.    . Cholecalciferol 2000 UNITS CAPS Take 2,000 Units by mouth every evening.     . gabapentin (NEURONTIN) 100 MG capsule TAKE ONE CAPSULE BY MOUTH TWICE A DAY - 180 capsule 3  . ibuprofen (IBU) 400 MG tablet Take 400 mg by mouth every 6 (six) hours as needed.    [name] levothyroxine (SYNTHROID, LEVOTHROID) 25 MCG tablet TAKE 1 TABLET BY MOUTH DAILY BEFORE BREAKFAST. 30 tablet 0  . Omega-3 Fatty Acids (FISH OIL) 1200 MG CAPS Take 1,200 mg by mouth 2 (two) times daily.    . Turmeric 500 MG CAPS Take 1 capsule by mouth at bedtime.    . vitamin C (ASCORBIC ACID) 500 MG tablet Take 1 tablet (500 mg total) by mouth daily. 30 tablet 0  . Vitamin D, Ergocalciferol, (DRISDOL) 50000 units CAPS capsule Take 1 capsule (50,000 Units total) by mouth every 7 (seven) days. 12 capsule 0   No facility-administered medications prior to visit.     ROS Review of Systems  Constitutional: Negative for appetite change, fatigue and unexpected weight change.  HENT: Negative for congestion, nosebleeds, sneezing, sore throat and trouble swallowing.   Eyes: Negative for itching and visual disturbance.  Respiratory: Negative for cough.   Cardiovascular: Negative for chest pain, palpitations and leg swelling.  Gastrointestinal: Negative for abdominal distention, blood in stool, diarrhea and nausea.  Genitourinary: Negative for frequency and hematuria.  Musculoskeletal: Positive for arthralgias and back pain. Negative for gait problem, joint swelling and neck pain.  Skin: Negative for rash.  Neurological: Negative for dizziness, tremors, speech difficulty and weakness.    Psychiatric/Behavioral: Negative for agitation, dysphoric mood and sleep disturbance. The patient is not nervous/anxious.     Objective:  BP 124/78 (BP Location: Right Arm, Patient Position: Sitting, Cuff Size: Normal)   Pulse (!) 54   Temp 97.8 F (36.6 C) (Oral)   Ht 6' 1.5" (1.867 m)   Wt 185 lb (83.9 kg)   SpO2 99%   BMI 24.08 kg/m   BP Readings from Last 3 Encounters:  05/28/17 124/78  05/21/17 136/75  05/09/17 128/74    Wt Readings from Last 3 Encounters:  05/28/17 185 lb (83.9 kg)  05/09/17 182 lb (82.6 kg)  04/11/17 186 lb (84.4 kg)    Physical Exam  Constitutional: He is oriented to person, place, and time. He appears well-developed. No distress.  NAD  HENT:  Mouth/Throat: Oropharynx is clear and moist.  Eyes: Pupils are equal, round, and reactive to light. Conjunctivae are normal.  Neck: Normal range of motion. No JVD present. No thyromegaly present.  Cardiovascular: Normal rate, regular rhythm, normal heart sounds and intact distal pulses.  Exam reveals no gallop and no friction rub.   No murmur heard. Pulmonary/Chest: Effort normal and breath sounds normal. No respiratory distress. He has no wheezes. He has no rales. He exhibits no tenderness.  Abdominal: Soft. Bowel sounds are normal. He exhibits no distension and no mass. There is no tenderness. There is no rebound and no guarding.  Musculoskeletal: Normal range of motion. He exhibits no  edema or tenderness.  Lymphadenopathy:    He has no cervical adenopathy.  Neurological: He is alert and oriented to person, place, and time. He has normal reflexes. No cranial nerve deficit. He exhibits normal muscle tone. He displays a negative Romberg sign. Coordination and gait normal.  Skin: Skin is warm and dry. No rash noted.  Psychiatric: He has a normal mood and affect. His behavior is normal. Judgment and thought content normal.  chest scar is healed, a little tender over wires Prostate - per Urology next  week  Lab Results  Component Value Date   WBC 4.6 05/09/2017   HGB 14.0 05/09/2017   HCT 41.8 05/09/2017   PLT 190.0 05/09/2017   GLUCOSE 103 (H) 10/19/2016   CHOL 122 (L) 04/18/2016   TRIG 115 04/18/2016   HDL 52 04/18/2016   LDLDIRECT 122.0 05/09/2015   LDLCALC 47 04/18/2016   ALT 20 10/11/2016   AST 27 10/11/2016   NA 135 10/19/2016   K 3.9 10/19/2016   CL 98 (L) 10/19/2016   CREATININE 0.84 10/19/2016   BUN 13 10/19/2016   CO2 26 10/19/2016   TSH 2.28 05/09/2017   PSA 4.83 (H) 04/19/2016   INR 1.33 10/15/2016   HGBA1C 5.6 10/11/2016    Dg Inject Diag/thera/inc Needle/cath/plc Epi/lumb/sac W/img  Result Date: 05/21/2017 CLINICAL DATA:  Low back and bilateral hip pain, left worse than right. Symptoms occur with exercise. FLUOROSCOPY TIME:  0 minutes 37 seconds. 26.47 micro gray meter squared PROCEDURE: The procedure, risks, benefits, and alternatives were explained to the patient. Questions regarding the procedure were encouraged and answered. The patient understands and consents to the procedure. LUMBAR EPIDURAL INJECTION: An interlaminar approach was performed on the left at L4-5. The overlying skin was cleansed and anesthetized. A 20 gauge epidural needle was advanced using loss-of-resistance technique. DIAGNOSTIC EPIDURAL INJECTION: Injection of Isovue-M 200 shows a good epidural pattern with spread above and below the level of needle placement, primarily on the left. No vascular opacification is seen. THERAPEUTIC EPIDURAL INJECTION: One hundred twenty Mg of Depo-Medrol mixed with 2 cc 1% lidocaine were instilled. The procedure was well-tolerated, and the patient was discharged thirty minutes following the injection in good condition. COMPLICATIONS: None IMPRESSION: Technically successful epidural injection on the left at L4-5 # 1. Electronically Signed   By: Nelson Chimes M.D.   On: 05/21/2017 12:21    Assessment & Plan:   There are no diagnoses linked to this encounter. I am  having Mr. Rappaport maintain his Cholecalciferol, aspirin, vitamin C, Fish Oil, Turmeric, Vitamin D (Ergocalciferol), gabapentin, levothyroxine, and ibuprofen.  No orders of the defined types were placed in this encounter.    Follow-up: No Follow-up on file.  [name], MD

## 2017-05-28 NOTE — Assessment & Plan Note (Signed)
Fish oil

## 2017-05-30 DIAGNOSIS — J301 Allergic rhinitis due to pollen: Secondary | ICD-10-CM | POA: Diagnosis not present

## 2017-05-30 DIAGNOSIS — J3089 Other allergic rhinitis: Secondary | ICD-10-CM | POA: Diagnosis not present

## 2017-06-02 ENCOUNTER — Other Ambulatory Visit: Payer: Self-pay | Admitting: Internal Medicine

## 2017-06-03 DIAGNOSIS — N5201 Erectile dysfunction due to arterial insufficiency: Secondary | ICD-10-CM | POA: Diagnosis not present

## 2017-06-03 DIAGNOSIS — R351 Nocturia: Secondary | ICD-10-CM | POA: Diagnosis not present

## 2017-06-03 DIAGNOSIS — R972 Elevated prostate specific antigen [PSA]: Secondary | ICD-10-CM | POA: Diagnosis not present

## 2017-06-04 ENCOUNTER — Ambulatory Visit (INDEPENDENT_AMBULATORY_CARE_PROVIDER_SITE_OTHER): Payer: Medicare Other | Admitting: Family Medicine

## 2017-06-04 ENCOUNTER — Encounter: Payer: Self-pay | Admitting: Family Medicine

## 2017-06-04 VITALS — BP 120/70 | HR 56 | Ht 73.0 in | Wt 186.0 lb

## 2017-06-04 DIAGNOSIS — M24559 Contracture, unspecified hip: Secondary | ICD-10-CM | POA: Diagnosis not present

## 2017-06-04 DIAGNOSIS — M5416 Radiculopathy, lumbar region: Secondary | ICD-10-CM

## 2017-06-04 DIAGNOSIS — I2510 Atherosclerotic heart disease of native coronary artery without angina pectoris: Secondary | ICD-10-CM | POA: Diagnosis not present

## 2017-06-04 DIAGNOSIS — M999 Biomechanical lesion, unspecified: Secondary | ICD-10-CM | POA: Diagnosis not present

## 2017-06-04 HISTORY — DX: Biomechanical lesion, unspecified: M99.9

## 2017-06-04 NOTE — Assessment & Plan Note (Signed)
Patient's is doing a little bit better with the lumbar radiculopathy after the injection. We'll consider another epidural at L5-S1 as continuing to have difficulty patient will continue the other medications. We discussed core strengthening. Patient to do well with manipulation. Follow up again in 4 weeks

## 2017-06-04 NOTE — Patient Instructions (Signed)
Good to see you  I am glad some improvement but wish for more.  We tried manipulation today  PT will be calling you  Ice is your friend.  Consider TENS unit with a lot of activity when done.  See me again in 3-4 weeks we will try manipulation and consider another injection before the games.

## 2017-06-04 NOTE — Progress Notes (Signed)
[facility] [place] [place], [place] [address] Phone: [phone] Subjective:      CC: Low back pain.  HWE:[id number]  Richard Ellis is a 71 y.o. male coming in with complaint of Low back pain. Patient was having worsening pain and does have a history of an L4 nerve root impingement. Patient was given an epidural 2 weeks ago. Patient states that he is feeling 30-40% better. Still though unfortunately when he starts running starts having worsening symptoms again. Patient describes pain as a dull, throbbing aching pain. What else can be done. Does notice that the pain with daily activities has improved      Past Medical History:  Diagnosis Date  . Allergy    rhinitis  . Anemia    "Hgb always on the low side" (07/02/2013)  . CAD (coronary artery disease)    a. LHC 2/17: pLAD 50, mLAD 95, pRCA 30, dRCA 25, EF 50-55% >> PCI:  3 x 18 mm Resolute DES to mLAD   . Chronic bronchitis ([place])    "not in the last year since I started taking allergy shots" (10/09/2016)  . Chronic lower back pain   . Cryptogenic stroke ([place]) 07/02/2013   a. "small ischemic occipital right sided" (07/02/2013)  //  b. Event monitor 10/14: sinus brady  //  c. Carotid US 10/14: bilat ICA 1-39%  . Esophageal stricture   . Heart murmur    "as a child" (07/02/2013)  . History of echocardiogram    a. Echo 10/14: EF 60-65%, no RWMA, normal diastolic function  . Hyperlipidemia   . Hypothyroidism   . Osteoarthritis    "lower back" (10/09/2016)  . PFO (patent foramen ovale)    a. TEE 11/14: mild LVH, EF 60-65%, small PFO, R-L shunt   Past Surgical History:  Procedure Laterality Date  . CARDIAC CATHETERIZATION N/A 11/10/2015   Procedure: Left Heart Cath and Coronary Angiography;  Surgeon: [name], MD;  Location: [place] [facility];  Service: Cardiovascular;  Laterality: N/A;  . CARDIAC CATHETERIZATION N/A 11/10/2015   Procedure: Coronary Stent Intervention;  Surgeon: [name], MD;  Location: [facility] [facility];  Service: Cardiovascular;  Laterality: N/A;  mid lad  3.0x18 resolute  . CARDIAC CATHETERIZATION  10/09/2016   "scheduled OHS for tomorrow" (10/09/2016)  . CARDIAC CATHETERIZATION N/A 10/09/2016   Procedure: Left Heart Cath and Coronary Angiography;  Surgeon: [name], MD;  Location: [facility];  Service: Cardiovascular;  Laterality: N/A;  . CARDIAC CATHETERIZATION N/A 10/09/2016   Procedure: Coronary Balloon Angioplasty;  Surgeon: [name], MD;  Location: [facility];  Service: Cardiovascular;  Laterality: N/A;  . CORONARY ARTERY BYPASS GRAFT N/A 10/15/2016   Procedure: CORONARY ARTERY BYPASS GRAFTING (CABG), ON PUMP, TIMES TWO, USING LEFT INTERNAL MAMMARY ARTERY AND RIGHT GREATER SAPHENOUS VEIN HARVESTED ENDOSCOPICALLY;  Surgeon: Ivin Poot, MD;  Location: [facility];  Service: Open Heart Surgery;  Laterality: N/A;  LIMA to LAD, SVG to DIAGONAL  . ESOPHAGOGASTRODUODENOSCOPY N/A 10/26/2012   Procedure: ESOPHAGOGASTRODUODENOSCOPY (EGD);  Surgeon: Winfield Cunas., MD;  Location: [facility]OSCOPY;  Service: Endoscopy;  Laterality: N/A;  . ESOPHAGOGASTRODUODENOSCOPY N/A 07/08/2013   Procedure: ESOPHAGOGASTRODUODENOSCOPY (EGD);  Surgeon: Winfield Cunas., MD;  Location: [facility];  Service: Endoscopy;  Laterality: N/A;  . INNER EAR SURGERY Left 1957   "related to ear infection"  . REPAIR OF PATENT FORAMEN OVALE N/A 10/15/2016   Procedure: REPAIR OF PATENT  FORAMEN OVALE;  Surgeon: Ivin Poot, MD;  Location: [facility];  Service: Open Heart Surgery;  Laterality: N/A;  . SAVORY DILATION N/A 07/08/2013   Procedure: SAVORY DILATION;  Surgeon: Winfield Cunas., MD;  Location: [facility];  Service: Endoscopy;  Laterality: N/A;  . TEE WITHOUT CARDIOVERSION N/A 07/28/2013   Procedure: TRANSESOPHAGEAL ECHOCARDIOGRAM (TEE);  Surgeon: Pixie Casino, MD;  Location: [facility];  Service: Cardiovascular;  Laterality: N/A;  . TEE WITHOUT  CARDIOVERSION N/A 10/15/2016   Procedure: TRANSESOPHAGEAL ECHOCARDIOGRAM (TEE);  Surgeon: Ivin Poot, MD;  Location: [facility];  Service: Open Heart Surgery;  Laterality: N/A;  . TONSILLECTOMY  1950's   Social History   Social History  . Marital status: Married    Spouse name: N/A  . Number of children: 1  . Years of education: N/A   Occupational History  . Retired    Social History Main Topics  . Smoking status: Never Smoker  . Smokeless tobacco: Never Used  . Alcohol use 4.8 oz/week    4 Glasses of wine, 4 Cans of beer per week     Comment: weekly  . Drug use: No  . Sexual activity: Yes   Other Topics Concern  . None   Social History Narrative  . None   Allergies  Allergen Reactions  . Atorvastatin Other (See Comments)    Muscle weakness  . Crestor [Rosuvastatin] Other (See Comments)    Muscle weakness  . Demerol [Meperidine] Nausea Only  . Niacin And Related Itching, Rash and Other (See Comments)    Palms turned red and itched  . Repatha [Evolocumab] Other (See Comments)    Muscle weakness   Family History  Problem Relation Age of Onset  . Coronary artery disease Other        male first degree relative <60  . Heart disease Father        heart attack  . Colon cancer Neg Hx      Past medical history, social, surgical and family history all reviewed in electronic medical record.  No pertanent information unless stated regarding to the chief complaint.   Review of Systems:Review of systems updated and as accurate as of 06/04/17  No headache, visual changes, nausea, vomiting, diarrhea, constipation, dizziness, abdominal pain, skin rash, fevers, chills, night sweats, weight loss, swollen lymph nodes, body aches, joint swelling,  chest pain, shortness of breath, mood changes. Positive muscle aches  Objective  Blood pressure 120/70, pulse (!) 56, height 6\' 1"  (1.854 m), weight 186 lb (84.4 kg), SpO2 95 %. Systems examined below as of 06/04/17   General: No  apparent distress alert and oriented x3 mood and affect normal, dressed appropriately.  HEENT: Pupils equal, extraocular movements intact  Respiratory: Patient's speak in full sentences and does not appear short of breath  Cardiovascular: No lower extremity edema, non tender, no erythema  Skin: Warm dry intact with no signs of infection or rash on extremities or on axial skeleton.  Abdomen: Soft nontender  Neuro: Cranial nerves II through XII are intact, neurovascularly intact in all extremities with 2+ DTRs and 2+ pulses.  Lymph: No lymphadenopathy of posterior or anterior cervical chain or axillae bilaterally.  Gait normal with good balance and coordination.  MSK:  Non tender with full range of motion and good stability and symmetric strength and tone of shoulders, elbows, wrist, hip, knee and ankles bilaterally.  Back Exam:  Inspection: Significant tightness with some mild decrease in extension Motion: Flexion 45  deg, Extension 25 deg, Side Bending to 35 deg bilaterally,  Rotation to 35 deg bilaterally  SLR laying: Negative  XSLR laying: Negative  Palpable tenderness: Tender to palpation in the paraspinal musculature of the L3-L5 bilaterally. FABER: Mild tightness left. Sensory change: Gross sensation intact to all lumbar and sacral dermatomes.  Reflexes: 2+ at both patellar tendons, 2+ at achilles tendons, Babinski's downgoing.  Strength at foot  Plantar-flexion: 5/5 Dorsi-flexion: 5/5 Eversion: 5/5 Inversion: 5/5  Leg strength  Quad: 5/5 Hamstring: 5/5 Hip flexor: 5/5 Hip abductors: 4/5  Gait unremarkable.  Osteopathic findings T5 extended rotated and side bent right  T8 extended rotated and side bent left L1 flexed rotated and side bent right Sacrum right on right     Impression and Recommendations:     This case required medical decision making of moderate complexity.      Note: This dictation was prepared with Dragon dictation along with smaller phrase technology.  Any transcriptional errors that result from this process are unintentional.

## 2017-06-04 NOTE — Assessment & Plan Note (Signed)
Decision today to treat with OMT was based on Physical Exam  After verbal consent patient was treated with HVLA, ME, FPR techniques in  thoracic, lumbar and sacral areas  Patient tolerated the procedure well with improvement in symptoms  Patient given exercises, stretches and lifestyle modifications  See medications in patient instructions if given  Patient will follow up in 4 weeks 

## 2017-06-07 DIAGNOSIS — J301 Allergic rhinitis due to pollen: Secondary | ICD-10-CM | POA: Diagnosis not present

## 2017-06-07 DIAGNOSIS — J3089 Other allergic rhinitis: Secondary | ICD-10-CM | POA: Diagnosis not present

## 2017-06-12 DIAGNOSIS — S76911D Strain of unspecified muscles, fascia and tendons at thigh level, right thigh, subsequent encounter: Secondary | ICD-10-CM | POA: Diagnosis not present

## 2017-06-12 DIAGNOSIS — M25551 Pain in right hip: Secondary | ICD-10-CM | POA: Diagnosis not present

## 2017-06-12 DIAGNOSIS — J3089 Other allergic rhinitis: Secondary | ICD-10-CM | POA: Diagnosis not present

## 2017-06-12 DIAGNOSIS — M25552 Pain in left hip: Secondary | ICD-10-CM | POA: Diagnosis not present

## 2017-06-12 DIAGNOSIS — S76912D Strain of unspecified muscles, fascia and tendons at thigh level, left thigh, subsequent encounter: Secondary | ICD-10-CM | POA: Diagnosis not present

## 2017-06-12 DIAGNOSIS — J301 Allergic rhinitis due to pollen: Secondary | ICD-10-CM | POA: Diagnosis not present

## 2017-06-19 DIAGNOSIS — J301 Allergic rhinitis due to pollen: Secondary | ICD-10-CM | POA: Diagnosis not present

## 2017-06-19 DIAGNOSIS — J3089 Other allergic rhinitis: Secondary | ICD-10-CM | POA: Diagnosis not present

## 2017-06-25 DIAGNOSIS — M25552 Pain in left hip: Secondary | ICD-10-CM | POA: Diagnosis not present

## 2017-06-25 DIAGNOSIS — M25551 Pain in right hip: Secondary | ICD-10-CM | POA: Diagnosis not present

## 2017-06-25 DIAGNOSIS — S76912D Strain of unspecified muscles, fascia and tendons at thigh level, left thigh, subsequent encounter: Secondary | ICD-10-CM | POA: Diagnosis not present

## 2017-06-25 DIAGNOSIS — S76911D Strain of unspecified muscles, fascia and tendons at thigh level, right thigh, subsequent encounter: Secondary | ICD-10-CM | POA: Diagnosis not present

## 2017-06-26 ENCOUNTER — Ambulatory Visit (INDEPENDENT_AMBULATORY_CARE_PROVIDER_SITE_OTHER): Payer: Medicare Other | Admitting: Family Medicine

## 2017-06-26 ENCOUNTER — Encounter: Payer: Self-pay | Admitting: Family Medicine

## 2017-06-26 VITALS — BP 130/60 | HR 53 | Ht 73.0 in | Wt 184.0 lb

## 2017-06-26 DIAGNOSIS — J3089 Other allergic rhinitis: Secondary | ICD-10-CM | POA: Diagnosis not present

## 2017-06-26 DIAGNOSIS — M999 Biomechanical lesion, unspecified: Secondary | ICD-10-CM

## 2017-06-26 DIAGNOSIS — J301 Allergic rhinitis due to pollen: Secondary | ICD-10-CM | POA: Diagnosis not present

## 2017-06-26 DIAGNOSIS — I2510 Atherosclerotic heart disease of native coronary artery without angina pectoris: Secondary | ICD-10-CM

## 2017-06-26 DIAGNOSIS — M5416 Radiculopathy, lumbar region: Secondary | ICD-10-CM | POA: Diagnosis not present

## 2017-06-26 MED ORDER — VENLAFAXINE HCL ER 37.5 MG PO CP24: 37.5000 mg | ORAL_CAPSULE | Freq: Every day | ORAL | 1 refills | Status: DC

## 2017-06-26 NOTE — Assessment & Plan Note (Signed)
Seems to be stable overall. We discussed with patient again about the possibility of a repeat injection. Patient has elected try a dose of Effexor. Warned of potential side effects. Hopefully that this will help some of the chronic discomfort. Patient will continue to be active otherwise. Patient will continue the other medications. Follow-up again in 4-6 weeks

## 2017-06-26 NOTE — Patient Instructions (Signed)
Good to see you  Lets try a small dose of effexor to help with the pain a little.  Take it in the morning.  Keep up with everything else.  See me again in 4 weeks.  Good luck with the race!

## 2017-06-26 NOTE — Assessment & Plan Note (Signed)
Decision today to treat with OMT was based on Physical Exam  After verbal consent patient was treated with HVLA, ME, FPR techniques in  thoracic, lumbar and sacral areas  Patient tolerated the procedure well with improvement in symptoms  Patient given exercises, stretches and lifestyle modifications  See medications in patient instructions if given  Patient will follow up in 4-6 weeks 

## 2017-06-26 NOTE — Progress Notes (Signed)
[name] [address] [place], [place] [address] Phone: [phone] Subjective:    I'm seeing this patient by the request  of:    CC: Low back pain follow-up  [id number]  BURDELL PEED is a 71 y.o. male coming in with complaint of low back pain. Patient was seen previously and does have an L4 nerve root impingement. In responding intermittently in the past. Patient is trying to increase activity. We did start osteopathic manipulation. Given home exercises. Patient states Overall seems to be doing about the same. No significant benefit. Still having soreness of the lower back and the hips bilaterally. Patient is still not able to increase his running at this time.      Past Medical History:  Diagnosis Date  . Allergy    rhinitis  . Anemia    "Hgb always on the low side" (07/02/2013)  . CAD (coronary artery disease)    a. LHC 2/17: pLAD 50, mLAD 95, pRCA 30, dRCA 25, EF 50-55% >> PCI:  3 x 18 mm Resolute DES to mLAD   . Chronic bronchitis ([place])    "not in the last year since I started taking allergy shots" (10/09/2016)  . Chronic lower back pain   . Cryptogenic stroke ([place]) 07/02/2013   a. "small ischemic occipital right sided" (07/02/2013)  //  b. Event monitor 10/14: sinus brady  //  c. Carotid US 10/14: bilat ICA 1-39%  . Esophageal stricture   . Heart murmur    "as a child" (07/02/2013)  . History of echocardiogram    a. Echo 10/14: EF 60-65%, no RWMA, normal diastolic function  . Hyperlipidemia   . Hypothyroidism   . Osteoarthritis    "lower back" (10/09/2016)  . PFO (patent foramen ovale)    a. TEE 11/14: mild LVH, EF 60-65%, small PFO, R-L shunt   Past Surgical History:  Procedure Laterality Date  . CARDIAC CATHETERIZATION N/A 11/10/2015   Procedure: Left Heart Cath and Coronary Angiography;  Surgeon: [name], MD;  Location: [facility];  Service: Cardiovascular;  Laterality: N/A;  . CARDIAC CATHETERIZATION N/A 11/10/2015    Procedure: Coronary Stent Intervention;  Surgeon: [name], MD;  Location: [facility];  Service: Cardiovascular;  Laterality: N/A;  mid lad  3.0x18 resolute  . CARDIAC CATHETERIZATION  10/09/2016   "scheduled OHS for tomorrow" (10/09/2016)  . CARDIAC CATHETERIZATION N/A 10/09/2016   Procedure: Left Heart Cath and Coronary Angiography;  Surgeon: [name], MD;  Location: [place] [facility];  Service: Cardiovascular;  Laterality: N/A;  . CARDIAC CATHETERIZATION N/A 10/09/2016   Procedure: Coronary Balloon Angioplasty;  Surgeon: [name], MD;  Location: [place] [facility];  Service: Cardiovascular;  Laterality: N/A;  . CORONARY ARTERY BYPASS GRAFT N/A 10/15/2016   Procedure: CORONARY ARTERY BYPASS GRAFTING (CABG), ON PUMP, TIMES TWO, USING LEFT INTERNAL MAMMARY ARTERY AND RIGHT GREATER SAPHENOUS VEIN HARVESTED ENDOSCOPICALLY;  Surgeon: Ivin Poot, MD;  Location: [facility];  Service: Open Heart Surgery;  Laterality: N/A;  LIMA to LAD, SVG to DIAGONAL  . ESOPHAGOGASTRODUODENOSCOPY N/A 10/26/2012   Procedure: ESOPHAGOGASTRODUODENOSCOPY (EGD);  Surgeon: Winfield Cunas., MD;  Location: [facility] ENDOSCOPY;  Service: Endoscopy;  Laterality: N/A;  . ESOPHAGOGASTRODUODENOSCOPY N/A 07/08/2013   Procedure: ESOPHAGOGASTRODUODENOSCOPY (EGD);  Surgeon: Winfield Cunas., MD;  Location: [facility];  Service: Endoscopy;  Laterality: N/A;  . INNER EAR SURGERY Left 1957   "related to ear infection"  . REPAIR OF  PATENT FORAMEN OVALE N/A 10/15/2016   Procedure: REPAIR OF PATENT FORAMEN OVALE;  Surgeon: Ivin Poot, MD;  Location: [place];  Service: Open Heart Surgery;  Laterality: N/A;  . SAVORY DILATION N/A 07/08/2013   Procedure: SAVORY DILATION;  Surgeon: Winfield Cunas., MD;  Location: [facility];  Service: Endoscopy;  Laterality: N/A;  . TEE WITHOUT CARDIOVERSION N/A 07/28/2013   Procedure: TRANSESOPHAGEAL ECHOCARDIOGRAM (TEE);  Surgeon: Pixie Casino, MD;  Location: [facility]  ENDOSCOPY;  Service: Cardiovascular;  Laterality: N/A;  . TEE WITHOUT CARDIOVERSION N/A 10/15/2016   Procedure: TRANSESOPHAGEAL ECHOCARDIOGRAM (TEE);  Surgeon: Ivin Poot, MD;  Location: [place];  Service: Open Heart Surgery;  Laterality: N/A;  . TONSILLECTOMY  1950's   Social History   Social History  . Marital status: Married    Spouse name: N/A  . Number of children: 1  . Years of education: N/A   Occupational History  . Retired    Social History Main Topics  . Smoking status: Never Smoker  . Smokeless tobacco: Never Used  . Alcohol use 4.8 oz/week    4 Glasses of wine, 4 Cans of beer per week     Comment: weekly  . Drug use: No  . Sexual activity: Yes   Other Topics Concern  . Not on file   Social History Narrative  . No narrative on file   Allergies  Allergen Reactions  . Atorvastatin Other (See Comments)    Muscle weakness  . Crestor [Rosuvastatin] Other (See Comments)    Muscle weakness  . Demerol [Meperidine] Nausea Only  . Niacin And Related Itching, Rash and Other (See Comments)    Palms turned red and itched  . Repatha [Evolocumab] Other (See Comments)    Muscle weakness   Family History  Problem Relation Age of Onset  . Coronary artery disease Other        male first degree relative <60  . Heart disease Father        heart attack  . Colon cancer Neg Hx      Past medical history, social, surgical and family history all reviewed in electronic medical record.  No pertanent information unless stated regarding to the chief complaint.   Review of Systems:Review of systems updated and as accurate as of 06/26/17  No headache, visual changes, nausea, vomiting, diarrhea, constipation, dizziness, abdominal pain, skin rash, fevers, chills, night sweats, weight loss, swollen lymph nodes, body aches, joint swelling, chest pain, shortness of breath, mood changes. Positive muscle aches  Objective  There were no vitals taken for this visit. Systems examined  below as of 06/26/17   General: No apparent distress alert and oriented x3 mood and affect normal, dressed appropriately.  HEENT: Pupils equal, extraocular movements intact  Respiratory: Patient's speak in full sentences and does not appear short of breath  Cardiovascular: No lower extremity edema, non tender, no erythema  Skin: Warm dry intact with no signs of infection or rash on extremities or on axial skeleton.  Abdomen: Soft nontender  Neuro: Cranial nerves II through XII are intact, neurovascularly intact in all extremities with 2+ DTRs and 2+ pulses.  Lymph: No lymphadenopathy of posterior or anterior cervical chain or axillae bilaterally.  Gait normal with good balance and coordination.  MSK:  Non tender with full range of motion and good stability and symmetric strength and tone of shoulders, elbows, wrist, hip, knee and ankles bilaterally. Mild arthritic changes of multiple joints  Back Exam:  Inspection: Mild  degenerative scoliosis of the very lower part of the lumbar spine Motion: Flexion 45 deg, Extension 25 deg, Side Bending to 35 deg bilaterally,  Rotation to 35 deg bilaterally  SLR laying: Negative  XSLR laying: Negative  Palpable tenderness: Tender to palpation in the paraspinal musculature lumbar spine from L4-S1 bilaterally. FABER: Tightness bilaterally. Sensory change: Gross sensation intact to all lumbar and sacral dermatomes.  Reflexes: 2+ at both patellar tendons, 2+ at achilles tendons, Babinski's downgoing.  Strength at foot  Plantar-flexion: 5/5 Dorsi-flexion: 5/5 Eversion: 5/5 Inversion: 5/5  Leg strength  Quad: 5/5 Hamstring: 5/5 Hip flexor: 5/5 Hip abductors: 4/5 and symmetric Gait unremarkable.  Osteopathic findings  T3 extended rotated and side bent right inhaled third rib T7 extended rotated and side bent left L2 flexed rotated and side bent right L4 flexed rotated and side bent left Sacrum right on right      Impression and Recommendations:       This case required medical decision making of moderate complexity.      Note: This dictation was prepared with Dragon dictation along with smaller phrase technology. Any transcriptional errors that result from this process are unintentional.

## 2017-07-02 DIAGNOSIS — J3089 Other allergic rhinitis: Secondary | ICD-10-CM | POA: Diagnosis not present

## 2017-07-02 DIAGNOSIS — J301 Allergic rhinitis due to pollen: Secondary | ICD-10-CM | POA: Diagnosis not present

## 2017-07-03 DIAGNOSIS — J301 Allergic rhinitis due to pollen: Secondary | ICD-10-CM | POA: Diagnosis not present

## 2017-07-03 DIAGNOSIS — J3089 Other allergic rhinitis: Secondary | ICD-10-CM | POA: Diagnosis not present

## 2017-07-10 DIAGNOSIS — J301 Allergic rhinitis due to pollen: Secondary | ICD-10-CM | POA: Diagnosis not present

## 2017-07-10 DIAGNOSIS — J3089 Other allergic rhinitis: Secondary | ICD-10-CM | POA: Diagnosis not present

## 2017-07-17 DIAGNOSIS — J3089 Other allergic rhinitis: Secondary | ICD-10-CM | POA: Diagnosis not present

## 2017-07-17 DIAGNOSIS — J301 Allergic rhinitis due to pollen: Secondary | ICD-10-CM | POA: Diagnosis not present

## 2017-07-18 ENCOUNTER — Other Ambulatory Visit: Payer: Self-pay | Admitting: *Deleted

## 2017-07-18 MED ORDER — VENLAFAXINE HCL ER 37.5 MG PO CP24: 37.5000 mg | ORAL_CAPSULE | Freq: Every day | ORAL | 1 refills | Status: DC

## 2017-07-18 NOTE — Telephone Encounter (Signed)
Refill done.  

## 2017-07-23 ENCOUNTER — Encounter: Payer: Self-pay | Admitting: Family Medicine

## 2017-07-23 ENCOUNTER — Ambulatory Visit (INDEPENDENT_AMBULATORY_CARE_PROVIDER_SITE_OTHER): Payer: Medicare Other | Admitting: Family Medicine

## 2017-07-23 VITALS — BP 118/68 | HR 58 | Ht 73.5 in | Wt 183.0 lb

## 2017-07-23 DIAGNOSIS — M5416 Radiculopathy, lumbar region: Secondary | ICD-10-CM

## 2017-07-23 DIAGNOSIS — M999 Biomechanical lesion, unspecified: Secondary | ICD-10-CM | POA: Diagnosis not present

## 2017-07-23 DIAGNOSIS — I2510 Atherosclerotic heart disease of native coronary artery without angina pectoris: Secondary | ICD-10-CM

## 2017-07-23 MED ORDER — VENLAFAXINE HCL ER 75 MG PO CP24: 75.0000 mg | ORAL_CAPSULE | Freq: Every day | ORAL | 1 refills | Status: DC

## 2017-07-23 NOTE — Progress Notes (Signed)
[name] [address], [place] [address] Phone: [phone] Subjective:    CC: Low back pain follow-up  [id number]  Richard Ellis is a 71 y.o. male coming in with complaint of low back pain.  We have seen patient previously.  Has had more of the left lumbar radiculopathy significant hip flexor tightness.  Advanced imaging visualized by me showing an L4 nerve root impingement she did have a nerve root block on May 21, 2017.  Since then we have been doing osteopathic manipulation and increasing activity.  Patient states that he has been running twice a week and his back is still bothering him. Patient states that effexor is almost out and he wonders if this would be of benefit to continue taking.  Patient does feel weight has been helping somewhat.  Patient continues to have the discomfort at home.     Past medical history is also significant for a labral tear and hip this was seen on MRI on October 31, 2015  Past Medical History:  Diagnosis Date  . Allergy    rhinitis  . Anemia    "Hgb always on the low side" (07/02/2013)  . CAD (coronary artery disease)    a. LHC 2/17: pLAD 50, mLAD 95, pRCA 30, dRCA 25, EF 50-55% >> PCI:  3 x 18 mm Resolute DES to mLAD   . Chronic bronchitis ([place])    "not in the last year since I started taking allergy shots" (10/09/2016)  . Chronic lower back pain   . Cryptogenic stroke ([place]) 07/02/2013   a. "small ischemic occipital right sided" (07/02/2013)  //  b. Event monitor 10/14: sinus brady  //  c. Carotid US 10/14: bilat ICA 1-39%  . Esophageal stricture   . Heart murmur    "as a child" (07/02/2013)  . History of echocardiogram    a. Echo 10/14: EF 60-65%, no RWMA, normal diastolic function  . Hyperlipidemia   . Hypothyroidism   . Osteoarthritis    "lower back" (10/09/2016)  . PFO (patent foramen ovale)    a. TEE 11/14: mild LVH, EF 60-65%, small PFO, R-L shunt   Past Surgical History:  Procedure  Laterality Date  . CARDIAC CATHETERIZATION  10/09/2016   "scheduled OHS for tomorrow" (10/09/2016)  . INNER EAR SURGERY Left 1957   "related to ear infection"  . TONSILLECTOMY  1950's   Social History   Socioeconomic History  . Marital status: Married    Spouse name: Not on file  . Number of children: 1  . Years of education: Not on file  . Highest education level: Not on file  Social Needs  . Financial resource strain: Not on file  . Food insecurity - worry: Not on file  . Food insecurity - inability: Not on file  . Transportation needs - medical: Not on file  . Transportation needs - non-medical: Not on file  Occupational History  . Occupation: Retired  Tobacco Use  . Smoking status: Never Smoker  . Smokeless tobacco: Never Used  Substance and Sexual Activity  . Alcohol use: Yes    Alcohol/week: 4.8 oz    Types: 4 Glasses of wine, 4 Cans of beer per week    Comment: weekly  . Drug use: No  . Sexual activity: Yes  Other Topics Concern  . Not on file  Social History Narrative  . Not on file   Allergies  Allergen Reactions  . Atorvastatin Other (See Comments)  Muscle weakness  . Crestor [Rosuvastatin] Other (See Comments)    Muscle weakness  . Demerol [Meperidine] Nausea Only  . Niacin And Related Itching, Rash and Other (See Comments)    Palms turned red and itched  . Repatha [Evolocumab] Other (See Comments)    Muscle weakness   Family History  Problem Relation Age of Onset  . Coronary artery disease Other        male first degree relative <60  . Heart disease Father        heart attack  . Colon cancer Neg Hx      Past medical history, social, surgical and family history all reviewed in electronic medical record.  No pertanent information unless stated regarding to the chief complaint.   Review of Systems:Review of systems updated and as accurate as of 07/23/17  No headache, visual changes, nausea, vomiting, diarrhea, constipation, dizziness,  abdominal pain, skin rash, fevers, chills, night sweats, weight loss, swollen lymph nodes, body aches, joint swelling,  chest pain, shortness of breath, mood changes.  Positive muscle aches still some fatigue  Objective  There were no vitals taken for this visit. Systems examined below as of 07/23/17   General: No apparent distress alert and oriented x3 mood and affect normal, dressed appropriately.  HEENT: Pupils equal, extraocular movements intact  Respiratory: Patient's speak in full sentences and does not appear short of breath  Cardiovascular: No lower extremity edema, non tender, no erythema  Skin: Warm dry intact with no signs of infection or rash on extremities or on axial skeleton.  Abdomen: Soft nontender  Neuro: Cranial nerves II through XII are intact, neurovascularly intact in all extremities with 2+ DTRs and 2+ pulses.  Lymph: No lymphadenopathy of posterior or anterior cervical chain or axillae bilaterally.  Gait normal with good balance and coordination.  MSK:  Non tender with full range of motion and good stability and symmetric strength and tone of shoulders, elbows, wrist, hip, knee and ankles bilaterally.  Back Exam:  Inspection: Mild loss of lordosis Motion: Flexion 45 deg, Extension 20 deg, Side Bending to 35 deg bilaterally,  Rotation to 45 deg bilaterally  SLR laying: Negative  XSLR laying: Negative  Palpable tenderness: Tender to palpation in the paraspinal musculature lumbar spine. FABER: Mild tightness bilaterally. Sensory change: Gross sensation intact to all lumbar and sacral dermatomes.  Reflexes: 2+ at both patellar tendons, 2+ at achilles tendons, Babinski's downgoing.  Strength at foot  Plantar-flexion: 5/5 Dorsi-flexion: 5/5 Eversion: 5/5 Inversion: 5/5  Leg strength  Quad: 5/5 Hamstring: 5/5 Hip flexor: 5/5 Hip abductors: 5/5  Gait unremarkable.  Osteopathic findings C2 flexed rotated and side bent right C7 flexed rotated and side bent left T3  extended rotated and side bent right inhaled third rib T7 extended rotated and side bent left L3 flexed rotated and side bent right Sacrum right on right    Impression and Recommendations:     This case required medical decision making of moderate complexity.      Note: This dictation was prepared with Dragon dictation along with smaller phrase technology. Any transcriptional errors that result from this process are unintentional.

## 2017-07-23 NOTE — Assessment & Plan Note (Signed)
Continues to have some radicular symptoms intermittently.  Does not seem to be making some improvement but patient does not seem to be as motivated as when he was with running.  Patient will increase the Effexor.  Discussed home exercise, which activities are doing which wants to avoid.  Patient will monitor for any other type of new symptoms.  Follow-up with me again in 4-6 weeks

## 2017-07-23 NOTE — Assessment & Plan Note (Signed)
Decision today to treat with OMT was based on Physical Exam  After verbal consent patient was treated with HVLA, ME, FPR techniques in  thoracic, lumbar and sacral areas  Patient tolerated the procedure well with improvement in symptoms  Patient given exercises, stretches and lifestyle modifications  See medications in patient instructions if given  Patient will follow up in 4-6 weeks 

## 2017-07-23 NOTE — Patient Instructions (Signed)
Good to see you  OK to increase to 75 mg daily of the Effexor and lets see if that helps.;  Weight lifting 2 times a week. One day legs the other day chest and back.   Pick 3-5 exercises each day  Do 3-5 sets.  Start at 5 reps per set and increase 1-2 reps per set per week up to 12 reps.  Once there then increase weight and go back down to 5 reps again.  See me again in 6 weeks

## 2017-07-24 DIAGNOSIS — J301 Allergic rhinitis due to pollen: Secondary | ICD-10-CM | POA: Diagnosis not present

## 2017-07-24 DIAGNOSIS — J3089 Other allergic rhinitis: Secondary | ICD-10-CM | POA: Diagnosis not present

## 2017-07-29 DIAGNOSIS — J3089 Other allergic rhinitis: Secondary | ICD-10-CM | POA: Diagnosis not present

## 2017-07-29 DIAGNOSIS — J301 Allergic rhinitis due to pollen: Secondary | ICD-10-CM | POA: Diagnosis not present

## 2017-07-29 IMAGING — CR DG CHEST 1V PORT
1 series · 1 of 1 positions shown · non-contrast
Comparison: 10/10/2016

CLINICAL DATA: Follow-up CABG

EXAM:
PORTABLE CHEST 1 VIEW

[AP]
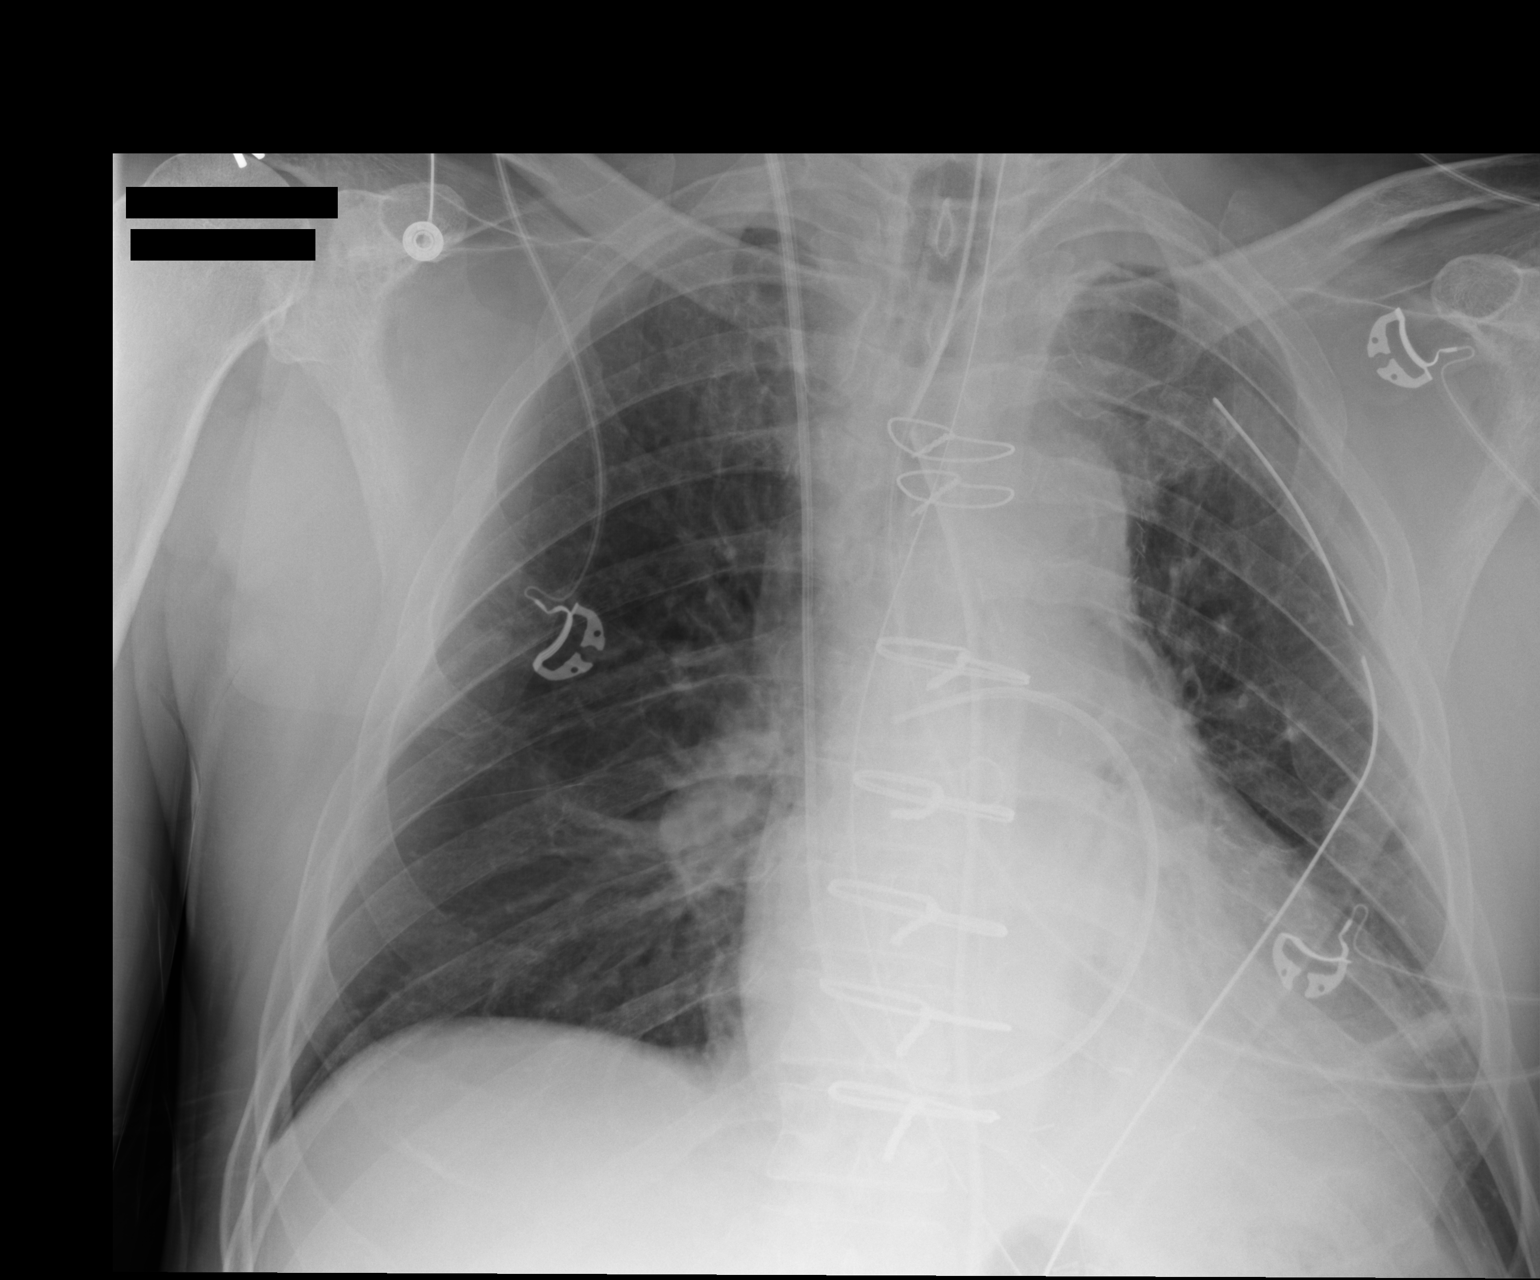

[1 of 1 positions shown; findings below may reference images not displayed]

FINDINGS: Previous median sternotomy and CABG. Endotracheal tube tip is 5 cm
above the carina. Nasogastric tube enters the abdomen. Swan-Ganz
catheter has its tip in the right main pulmonary artery. Left chest
tube is in place. No pneumothorax. Right lung is clear. Moderate
atelectasis left lower lobe. Small amount of pleural fluid on the
left.
IMPRESSION: Lines and tubes well positioned. Moderate volume loss left lower
lobe. No pneumothorax.

## 2017-07-31 DIAGNOSIS — J301 Allergic rhinitis due to pollen: Secondary | ICD-10-CM | POA: Diagnosis not present

## 2017-07-31 DIAGNOSIS — J3089 Other allergic rhinitis: Secondary | ICD-10-CM | POA: Diagnosis not present

## 2017-08-01 ENCOUNTER — Encounter: Payer: Self-pay | Admitting: Family Medicine

## 2017-08-02 IMAGING — CR DG CHEST 2V
2 series · 2 of 2 positions shown · non-contrast
Comparison: 10/18/2016.

CLINICAL DATA: CABG.

EXAM:
CHEST  2 VIEW

[chest lat]
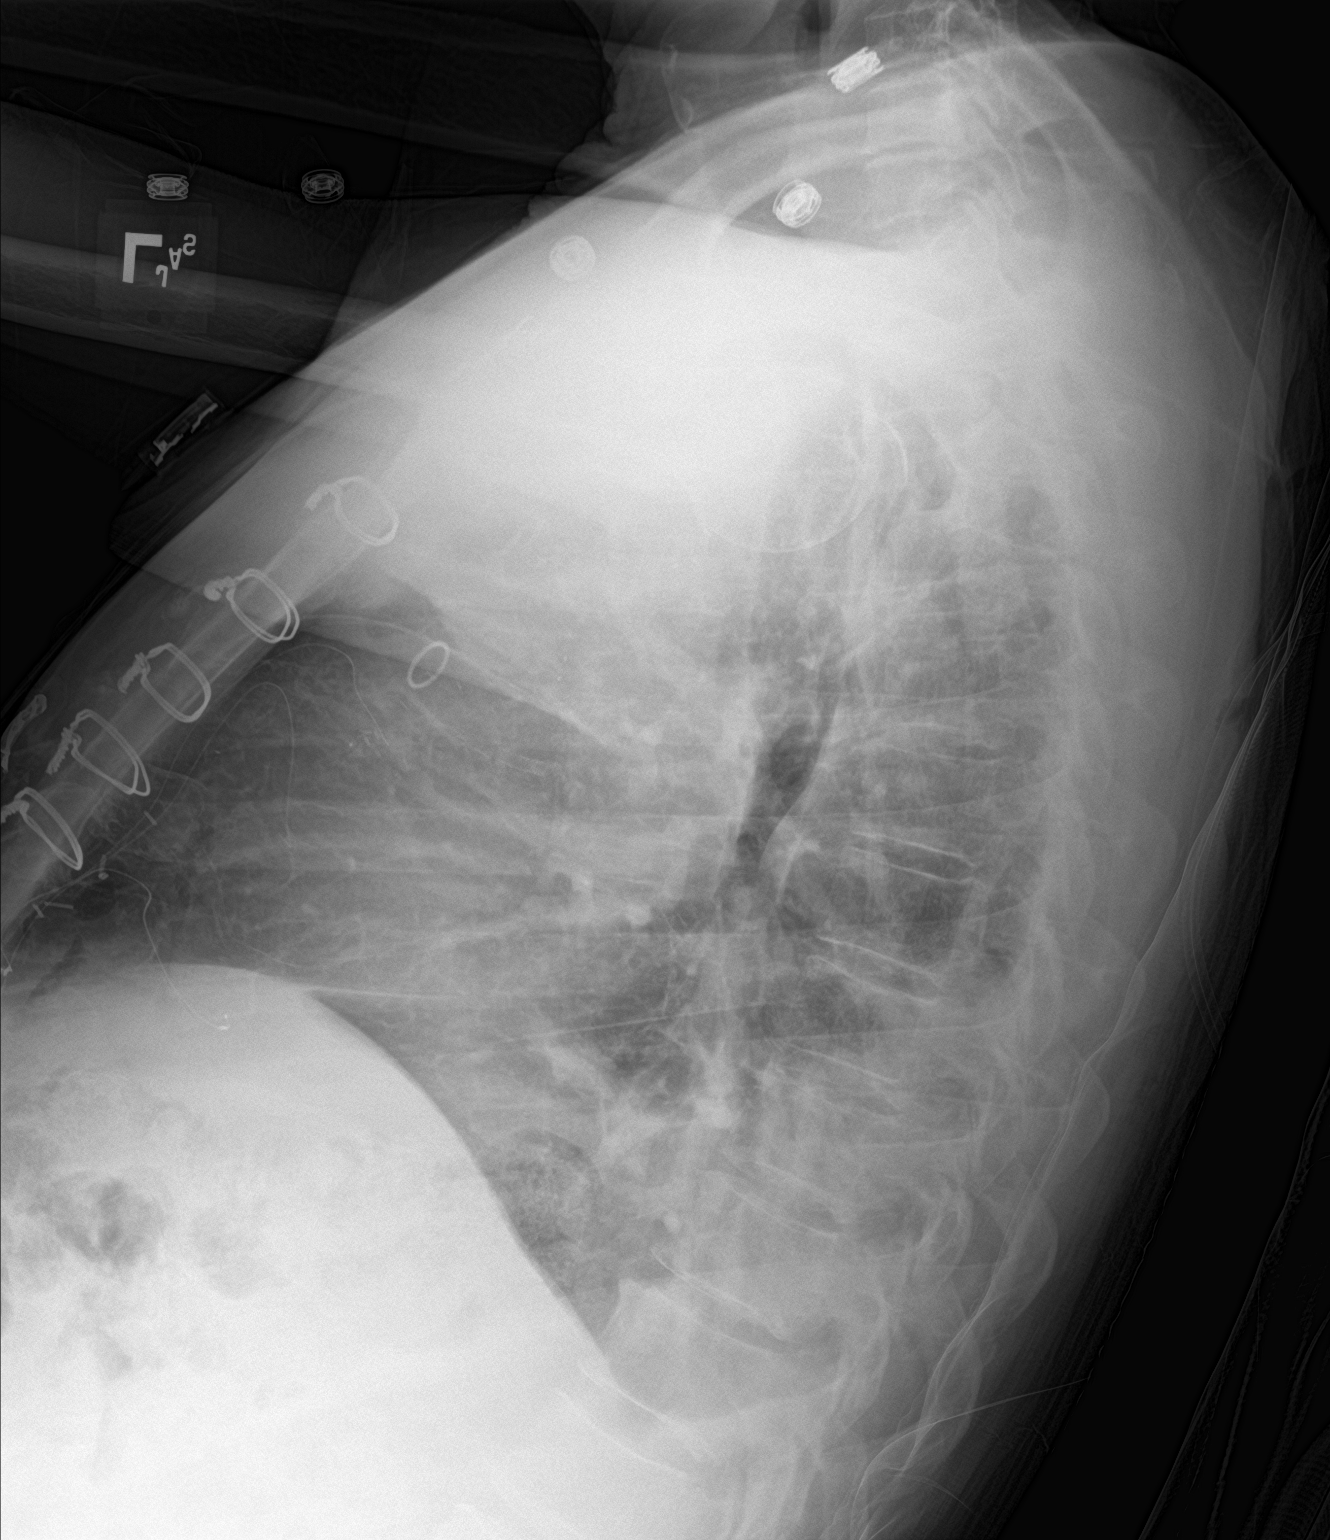

[chest ap]
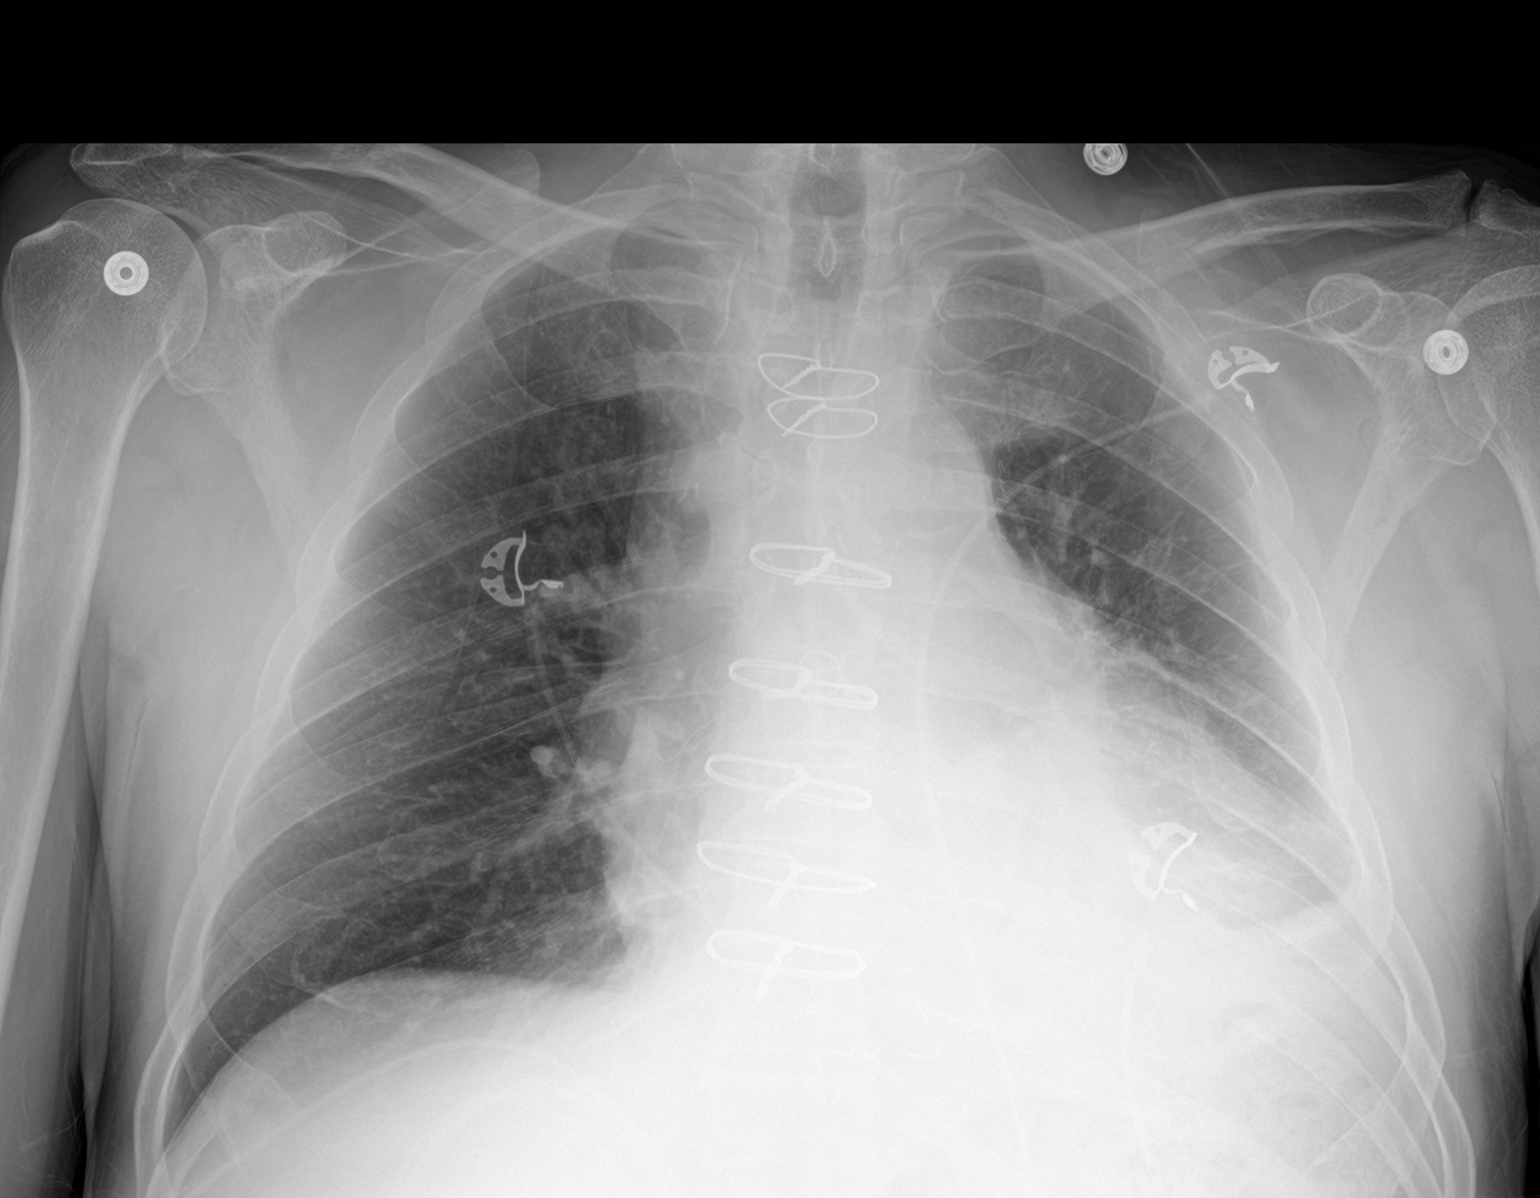

[2 of 2 positions shown; findings below may reference images not displayed]

FINDINGS: Prior CABG. Cardiomegaly. Left lower lobe infiltrate with small left
pleural effusion. No interim change. No pneumothorax.
IMPRESSION: 1. Prior CABG.  Cardiomegaly.  No pulmonary venous congestion.

2. Left lower lobe infiltrate with small left pleural effusion. No
interim change .

## 2017-08-06 DIAGNOSIS — J45991 Cough variant asthma: Secondary | ICD-10-CM | POA: Diagnosis not present

## 2017-08-06 DIAGNOSIS — J301 Allergic rhinitis due to pollen: Secondary | ICD-10-CM | POA: Diagnosis not present

## 2017-08-06 DIAGNOSIS — J3089 Other allergic rhinitis: Secondary | ICD-10-CM | POA: Diagnosis not present

## 2017-08-23 DIAGNOSIS — J3089 Other allergic rhinitis: Secondary | ICD-10-CM | POA: Diagnosis not present

## 2017-08-23 DIAGNOSIS — J301 Allergic rhinitis due to pollen: Secondary | ICD-10-CM | POA: Diagnosis not present

## 2017-09-02 NOTE — Progress Notes (Signed)
[name] [place],  [address] Phone: [phone] Subjective:     CC: Back pain follow-up  FUX:[id number]  Richard Ellis is a 71 y.o. male coming in with complaint of back pain.  Found to have an L4 nerve root impingement.  Attempted nerve root sheath injection with some improvement.  We also started low-dose of Effexor as well as osteopathic manipulation.  Increased Effexor dose on July 23, 2017.  Patient states overall seems to be doing worse since he has gotten off the Effexor completely.  Patient states the pain seems to be worsening.  Was running up to 20 miles a week but now is back down to 5 mile secondary to the discomfort and pain.      Past Medical History:  Diagnosis Date  . Allergy    rhinitis  . Anemia    "Hgb always on the low side" (07/02/2013)  . CAD (coronary artery disease)    a. LHC 2/17: pLAD 50, mLAD 95, pRCA 30, dRCA 25, EF 50-55% >> PCI:  3 x 18 mm Resolute DES to mLAD   . Chronic bronchitis ([place])    "not in the last year since I started taking allergy shots" (10/09/2016)  . Chronic lower back pain   . Cryptogenic stroke ([place]) 07/02/2013   a. "small ischemic occipital right sided" (07/02/2013)  //  b. Event monitor 10/14: sinus brady  //  c. Carotid US 10/14: bilat ICA 1-39%  . Esophageal stricture   . Heart murmur    "as a child" (07/02/2013)  . History of echocardiogram    a. Echo 10/14: EF 60-65%, no RWMA, normal diastolic function  . Hyperlipidemia   . Hypothyroidism   . Osteoarthritis    "lower back" (10/09/2016)  . PFO (patent foramen ovale)    a. TEE 11/14: mild LVH, EF 60-65%, small PFO, R-L shunt   Past Surgical History:  Procedure Laterality Date  . CARDIAC CATHETERIZATION N/A 11/10/2015   Procedure: Left Heart Cath and Coronary Angiography;  Surgeon: [name], MD;  Location: [place] [facility];  Service: Cardiovascular;  Laterality: N/A;  . CARDIAC CATHETERIZATION N/A 11/10/2015   Procedure: Coronary Stent Intervention;  Surgeon: [name], MD;  Location: [place] [facility];  Service: Cardiovascular;  Laterality: N/A;  mid lad  3.0x18 resolute  . CARDIAC CATHETERIZATION  10/09/2016   "scheduled OHS for tomorrow" (10/09/2016)  . CARDIAC CATHETERIZATION N/A 10/09/2016   Procedure: Left Heart Cath and Coronary Angiography;  Surgeon: [name], MD;  Location: [place] [facility];  Service: Cardiovascular;  Laterality: N/A;  . CARDIAC CATHETERIZATION N/A 10/09/2016   Procedure: Coronary Balloon Angioplasty;  Surgeon: [name], MD;  Location: [place] [facility];  Service: Cardiovascular;  Laterality: N/A;  . CORONARY ARTERY BYPASS GRAFT N/A 10/15/2016   Procedure: CORONARY ARTERY BYPASS GRAFTING (CABG), ON PUMP, TIMES TWO, USING LEFT INTERNAL MAMMARY ARTERY AND RIGHT GREATER SAPHENOUS VEIN HARVESTED ENDOSCOPICALLY;  Surgeon: Ivin Poot, MD;  Location: [place];  Service: Open Heart Surgery;  Laterality: N/A;  LIMA to LAD, SVG to DIAGONAL  . ESOPHAGOGASTRODUODENOSCOPY N/A 10/26/2012   Procedure: ESOPHAGOGASTRODUODENOSCOPY (EGD);  Surgeon: Winfield Cunas., MD;  Location: [facility];  Service: Endoscopy;  Laterality: N/A;  . ESOPHAGOGASTRODUODENOSCOPY N/A 07/08/2013   Procedure: ESOPHAGOGASTRODUODENOSCOPY (EGD);  Surgeon: Winfield Cunas., MD;  Location: [facility];  Service: Endoscopy;  Laterality: N/A;  . INNER EAR SURGERY Left 1957   "related to ear  infection"  . REPAIR OF PATENT FORAMEN OVALE N/A 10/15/2016   Procedure: REPAIR OF PATENT FORAMEN OVALE;  Surgeon: Ivin Poot, MD;  Location: [place];  Service: Open Heart Surgery;  Laterality: N/A;  . SAVORY DILATION N/A 07/08/2013   Procedure: SAVORY DILATION;  Surgeon: Winfield Cunas., MD;  Location: [facility];  Service: Endoscopy;  Laterality: N/A;  . TEE WITHOUT CARDIOVERSION N/A 07/28/2013   Procedure: TRANSESOPHAGEAL ECHOCARDIOGRAM (TEE);  Surgeon: Pixie Casino, MD;  Location: [facility];   Service: Cardiovascular;  Laterality: N/A;  . TEE WITHOUT CARDIOVERSION N/A 10/15/2016   Procedure: TRANSESOPHAGEAL ECHOCARDIOGRAM (TEE);  Surgeon: Ivin Poot, MD;  Location: [place];  Service: Open Heart Surgery;  Laterality: N/A;  . TONSILLECTOMY  1950's   Social History   Socioeconomic History  . Marital status: Married    Spouse name: None  . Number of children: 1  . Years of education: None  . Highest education level: None  Social Needs  . Financial resource strain: None  . Food insecurity - worry: None  . Food insecurity - inability: None  . Transportation needs - medical: None  . Transportation needs - non-medical: None  Occupational History  . Occupation: Retired  Tobacco Use  . Smoking status: Never Smoker  . Smokeless tobacco: Never Used  Substance and Sexual Activity  . Alcohol use: Yes    Alcohol/week: 4.8 oz    Types: 4 Glasses of wine, 4 Cans of beer per week    Comment: weekly  . Drug use: No  . Sexual activity: Yes  Other Topics Concern  . None  Social History Narrative  . None   Allergies  Allergen Reactions  . Atorvastatin Other (See Comments)    Muscle weakness  . Crestor [Rosuvastatin] Other (See Comments)    Muscle weakness  . Demerol [Meperidine] Nausea Only  . Niacin And Related Itching, Rash and Other (See Comments)    Palms turned red and itched  . Repatha [Evolocumab] Other (See Comments)    Muscle weakness   Family History  Problem Relation Age of Onset  . Coronary artery disease Other        male first degree relative <60  . Heart disease Father        heart attack  . Colon cancer Neg Hx      Past medical history, social, surgical and family history all reviewed in electronic medical record.  No pertanent information unless stated regarding to the chief complaint.   Review of Systems:Review of systems updated and as accurate as of 09/03/17  No headache, visual changes, nausea, vomiting, diarrhea, constipation, dizziness,  abdominal pain, skin rash, fevers, chills, night sweats, weight loss, swollen lymph nodes, body aches, joint swelling,  chest pain, shortness of breath, mood changes.  Positive muscle aches  Objective  Blood pressure 110/64, pulse (!) 57, height 6\' 1"  (1.854 m), weight 183 lb (83 kg), SpO2 97 %. Systems examined below as of 09/03/17   General: No apparent distress alert and oriented x3 mood and affect normal, dressed appropriately.  HEENT: Pupils equal, extraocular movements intact  Respiratory: Patient's speak in full sentences and does not appear short of breath  Cardiovascular: No lower extremity edema, non tender, no erythema  Skin: Warm dry intact with no signs of infection or rash on extremities or on axial skeleton.  Abdomen: Soft nontender  Neuro: Cranial nerves II through XII are intact, neurovascularly intact in all extremities with 2+ DTRs and 2+ pulses.  Lymph: No lymphadenopathy of posterior or anterior cervical chain or axillae bilaterally.  Gait normal with good balance and coordination.  MSK:  Non tender with full range of motion and good stability and symmetric strength and tone of shoulders, elbows, wrist, hip, knee and ankles bilaterally.  Back Exam:  Inspection: Mild loss of lordosis with degenerative scoliosis Motion: Flexion 45 deg, Extension 20 deg, Side Bending to 35 deg bilaterally,  Rotation to 35 deg bilaterally  SLR laying: Positive straight leg test on the left XSLR laying: Negative  Palpable tenderness: Tender to palpation in the paraspinal musculature. FABER: Significant tightness bilaterally. Sensory change: Gross sensation intact to all lumbar and sacral dermatomes.  Reflexes: 2+ at both patellar tendons, 2+ at achilles tendons, Babinski's downgoing.  Strength at foot  Plantar-flexion: 5/5 Dorsi-flexion: 5/5 Eversion: 5/5 Inversion: 5/5  Leg strength  Quad: 5/5 Hamstring: 5/5 Hip flexor: 5/5 Hip abductors: 5/5  Gait unremarkable.  Osteopathic  findings C2 flexed rotated and side bent right T3 extended rotated and side bent right inhaled third rib T6 extended rotated and side bent left L3 flexed rotated and side bent right Sacrum right on right     Impression and Recommendations:     This case required medical decision making of moderate complexity.      Note: This dictation was prepared with Dragon dictation along with smaller phrase technology. Any transcriptional errors that result from this process are unintentional.

## 2017-09-03 ENCOUNTER — Encounter: Payer: Self-pay | Admitting: Family Medicine

## 2017-09-03 ENCOUNTER — Ambulatory Visit (INDEPENDENT_AMBULATORY_CARE_PROVIDER_SITE_OTHER): Payer: Medicare Other | Admitting: Family Medicine

## 2017-09-03 VITALS — BP 110/64 | HR 57 | Ht 73.0 in | Wt 183.0 lb

## 2017-09-03 DIAGNOSIS — M549 Dorsalgia, unspecified: Secondary | ICD-10-CM

## 2017-09-03 DIAGNOSIS — I2510 Atherosclerotic heart disease of native coronary artery without angina pectoris: Secondary | ICD-10-CM | POA: Diagnosis not present

## 2017-09-03 DIAGNOSIS — M999 Biomechanical lesion, unspecified: Secondary | ICD-10-CM | POA: Diagnosis not present

## 2017-09-03 DIAGNOSIS — M5416 Radiculopathy, lumbar region: Secondary | ICD-10-CM

## 2017-09-03 NOTE — Assessment & Plan Note (Signed)
Patient is having some mild increase in lumbar radiculopathy.  No weakness noted.  Patient denies having radicular symptoms at this time.  Off all medications.  Repeat epidural.  Discussed the possibility of Cymbalta.  Follow-up again in 4 weeks

## 2017-09-03 NOTE — Patient Instructions (Signed)
Good to see you  Richard Ellis is your friend.  We order the injection and you can call them at [phone] to speed it up  Stay active. And write me after your run See me again in 3 weeks after the injection and if not better we will consider cymbalta  Happy holidays!

## 2017-09-03 NOTE — Assessment & Plan Note (Signed)
Decision today to treat with OMT was based on Physical Exam  After verbal consent patient was treated with HVLA, ME, FPR techniques in cervical, thoracic, lumbar and sacral areas  Patient tolerated the procedure well with improvement in symptoms  Patient given exercises, stretches and lifestyle modifications  See medications in patient instructions if given  Patient will follow up in 4-6 weeks 

## 2017-09-04 ENCOUNTER — Encounter: Payer: Self-pay | Admitting: Family Medicine

## 2017-09-11 DIAGNOSIS — J3089 Other allergic rhinitis: Secondary | ICD-10-CM | POA: Diagnosis not present

## 2017-09-11 DIAGNOSIS — J301 Allergic rhinitis due to pollen: Secondary | ICD-10-CM | POA: Diagnosis not present

## 2017-09-24 ENCOUNTER — Ambulatory Visit
Admission: RE | Admit: 2017-09-24 | Discharge: 2017-09-24 | Disposition: A | Discharge status: Home / Self Care | Payer: Medicare Other | Source: Ambulatory Visit | Attending: Family Medicine | Admitting: Family Medicine

## 2017-09-24 DIAGNOSIS — M5416 Radiculopathy, lumbar region: Secondary | ICD-10-CM

## 2017-09-24 DIAGNOSIS — M545 Low back pain: Secondary | ICD-10-CM | POA: Diagnosis not present

## 2017-09-24 MED ORDER — IOPAMIDOL (ISOVUE-M 200) INJECTION 41%
1.0000 mL | Freq: Once | INTRAMUSCULAR | Status: AC
  Administered 2017-09-24: 1 mL via EPIDURAL

## 2017-09-24 MED ORDER — METHYLPREDNISOLONE ACETATE 40 MG/ML INJ SUSP (RADIOLOG
120.0000 mg | Freq: Once | INTRAMUSCULAR | Status: AC
  Administered 2017-09-24: 120 mg via EPIDURAL

## 2017-09-24 NOTE — Discharge Instructions (Signed)

## 2017-09-25 DIAGNOSIS — J3089 Other allergic rhinitis: Secondary | ICD-10-CM | POA: Diagnosis not present

## 2017-09-25 DIAGNOSIS — J301 Allergic rhinitis due to pollen: Secondary | ICD-10-CM | POA: Diagnosis not present

## 2017-10-09 DIAGNOSIS — J301 Allergic rhinitis due to pollen: Secondary | ICD-10-CM | POA: Diagnosis not present

## 2017-10-09 DIAGNOSIS — J3089 Other allergic rhinitis: Secondary | ICD-10-CM | POA: Diagnosis not present

## 2017-10-14 NOTE — Progress Notes (Signed)
[facility] [name], [name] [address] Phone: [phone] Subjective:     CC: Back pain follow-up  HQI:[id number]  Richard Ellis is a 72 y.o. male coming in with complaint of low back pain.  Patient found to have an L4 nerve root impingement on advanced imaging.  Patient did have an injection with some improvement and we started osteopathic manipulation 4 months ago.  Patient has been on Effexor and we increase his dose July 23, 2017.  Continue to have some pain.  Sent for another nerve root injection September 24, 2017.  Patient states that his back is not any better than last visit. He stopped using the Effexor. He had an epidural also but had no relief. He has not been able to run to due to the pain.       Past Medical History:  Diagnosis Date  . Allergy    rhinitis  . Anemia    "Hgb always on the low side" (07/02/2013)  . CAD (coronary artery disease)    a. LHC 2/17: pLAD 50, mLAD 95, pRCA 30, dRCA 25, EF 50-55% >> PCI:  3 x 18 mm Resolute DES to mLAD   . Chronic bronchitis ([place])    "not in the last year since I started taking allergy shots" (10/09/2016)  . Chronic lower back pain   . Cryptogenic stroke ([place]) 07/02/2013   a. "small ischemic occipital right sided" (07/02/2013)  //  b. Event monitor 10/14: sinus brady  //  c. Carotid US 10/14: bilat ICA 1-39%  . Esophageal stricture   . Heart murmur    "as a child" (07/02/2013)  . History of echocardiogram    a. Echo 10/14: EF 60-65%, no RWMA, normal diastolic function  . Hyperlipidemia   . Hypothyroidism   . Osteoarthritis    "lower back" (10/09/2016)  . PFO (patent foramen ovale)    a. TEE 11/14: mild LVH, EF 60-65%, small PFO, R-L shunt   Past Surgical History:  Procedure Laterality Date  . CARDIAC CATHETERIZATION N/A 11/10/2015   Procedure: Left Heart Cath and Coronary Angiography;  Surgeon: [name], MD;  Location: [place] [facility];  Service: Cardiovascular;  Laterality:  N/A;  . CARDIAC CATHETERIZATION N/A 11/10/2015   Procedure: Coronary Stent Intervention;  Surgeon: [name], MD;  Location: [place] [facility];  Service: Cardiovascular;  Laterality: N/A;  mid lad  3.0x18 resolute  . CARDIAC CATHETERIZATION  10/09/2016   "scheduled OHS for tomorrow" (10/09/2016)  . CARDIAC CATHETERIZATION N/A 10/09/2016   Procedure: Left Heart Cath and Coronary Angiography;  Surgeon: [name], MD;  Location: [place] [facility];  Service: Cardiovascular;  Laterality: N/A;  . CARDIAC CATHETERIZATION N/A 10/09/2016   Procedure: Coronary Balloon Angioplasty;  Surgeon: [name], MD;  Location: [place] [facility];  Service: Cardiovascular;  Laterality: N/A;  . CORONARY ARTERY BYPASS GRAFT N/A 10/15/2016   Procedure: CORONARY ARTERY BYPASS GRAFTING (CABG), ON PUMP, TIMES TWO, USING LEFT INTERNAL MAMMARY ARTERY AND RIGHT GREATER SAPHENOUS VEIN HARVESTED ENDOSCOPICALLY;  Surgeon: Ivin Poot, MD;  Location: [place];  Service: Open Heart Surgery;  Laterality: N/A;  LIMA to LAD, SVG to DIAGONAL  . ESOPHAGOGASTRODUODENOSCOPY N/A 10/26/2012   Procedure: ESOPHAGOGASTRODUODENOSCOPY (EGD);  Surgeon: Winfield Cunas., MD;  Location: [facility]COPY;  Service: Endoscopy;  Laterality: N/A;  . ESOPHAGOGASTRODUODENOSCOPY N/A 07/08/2013   Procedure: ESOPHAGOGASTRODUODENOSCOPY (EGD);  Surgeon: Winfield Cunas., MD;  Location: [facility];  Service: Endoscopy;  Laterality: N/A;  . INNER EAR SURGERY Left 1957   "related to ear infection"  . REPAIR OF PATENT FORAMEN OVALE N/A 10/15/2016   Procedure: REPAIR OF PATENT FORAMEN OVALE;  Surgeon: Ivin Poot, MD;  Location: [place];  Service: Open Heart Surgery;  Laterality: N/A;  . SAVORY DILATION N/A 07/08/2013   Procedure: SAVORY DILATION;  Surgeon: Winfield Cunas., MD;  Location: [facility];  Service: Endoscopy;  Laterality: N/A;  . TEE WITHOUT CARDIOVERSION N/A 07/28/2013   Procedure: TRANSESOPHAGEAL ECHOCARDIOGRAM (TEE);   Surgeon: Pixie Casino, MD;  Location: [facility] ENDOSCOPY;  Service: Cardiovascular;  Laterality: N/A;  . TEE WITHOUT CARDIOVERSION N/A 10/15/2016   Procedure: TRANSESOPHAGEAL ECHOCARDIOGRAM (TEE);  Surgeon: Ivin Poot, MD;  Location: [place];  Service: Open Heart Surgery;  Laterality: N/A;  . TONSILLECTOMY  1950's   Social History   Socioeconomic History  . Marital status: Married    Spouse name: None  . Number of children: 1  . Years of education: None  . Highest education level: None  Social Needs  . Financial resource strain: None  . Food insecurity - worry: None  . Food insecurity - inability: None  . Transportation needs - medical: None  . Transportation needs - non-medical: None  Occupational History  . Occupation: Retired  Tobacco Use  . Smoking status: Never Smoker  . Smokeless tobacco: Never Used  Substance and Sexual Activity  . Alcohol use: Yes    Alcohol/week: 4.8 oz    Types: 4 Glasses of wine, 4 Cans of beer per week    Comment: weekly  . Drug use: No  . Sexual activity: Yes  Other Topics Concern  . None  Social History Narrative  . None   Allergies  Allergen Reactions  . Atorvastatin Other (See Comments)    Muscle weakness  . Crestor [Rosuvastatin] Other (See Comments)    Muscle weakness  . Demerol [Meperidine] Nausea Only  . Niacin And Related Itching, Rash and Other (See Comments)    Palms turned red and itched  . Repatha [Evolocumab] Other (See Comments)    Muscle weakness   Family History  Problem Relation Age of Onset  . Coronary artery disease Other        male first degree relative <60  . Heart disease Father        heart attack  . Colon cancer Neg Hx      Past medical history, social, surgical and family history all reviewed in electronic medical record.  No pertanent information unless stated regarding to the chief complaint.   Review of Systems:Review of systems updated and as accurate as of 10/15/17  No headache, visual changes,  nausea, vomiting, diarrhea, constipation, dizziness, abdominal pain, skin rash, fevers, chills, night sweats, weight loss, swollen lymph nodes, body aches, joint swelling, chest pain, shortness of breath, mood changes.  Positive muscle aches  Objective  Blood pressure 120/76, pulse (!) 49, height 6' 1.5" (1.867 m), weight 191 lb (86.6 kg), SpO2 98 %. Systems examined below as of 10/15/17   General: No apparent distress alert and oriented x3 mood and affect normal, dressed appropriately.  HEENT: Pupils equal, extraocular movements intact  Respiratory: Patient's speak in full sentences and does not appear short of breath  Cardiovascular: No lower extremity edema, non tender, no erythema  Skin: Warm dry intact with no signs of infection or rash on extremities or on axial skeleton.  Abdomen: Soft nontender  Neuro: Cranial nerves II through XII  are intact, neurovascularly intact in all extremities with 2+ DTRs and 2+ pulses.  Lymph: No lymphadenopathy of posterior or anterior cervical chain or axillae bilaterally.  Gait normal with good balance and coordination.  MSK:  Non tender with full range of motion and good stability and symmetric strength and tone of shoulders, elbows, wrist, hip, knee and ankles bilaterally.  Back Exam:  Inspection: Loss of lordosis Motion: Flexion 45 deg, Extension 20 deg, Side Bending to 35 deg bilaterally,  Rotation to 35 deg bilaterally  SLR laying: Positive straight leg on the left side at 20 degrees XSLR laying: Negative  Palpable tenderness: Tender to palpation in the paraspinal musculature lumbar spine left greater than right. FABER: Positive Faber. Sensory change: Gross sensation intact to all lumbar and sacral dermatomes.  Reflexes: 2+ at both patellar tendons, 2+ at achilles tendons, Babinski's downgoing.  Strength at foot  Plantar-flexion: 5/5 Dorsi-flexion: 5/5 Eversion: 5/5 Inversion: 5/5  Leg strength  Quad: 5/5 Hamstring: 5/5 Hip flexor: 5/5 Hip  abductors: 5/5  Gait unremarkable.      Impression and Recommendations:     This case required medical decision making of moderate complexity.      Note: This dictation was prepared with Dragon dictation along with smaller phrase technology. Any transcriptional errors that result from this process are unintentional.

## 2017-10-15 ENCOUNTER — Ambulatory Visit (INDEPENDENT_AMBULATORY_CARE_PROVIDER_SITE_OTHER): Payer: Medicare Other | Admitting: Family Medicine

## 2017-10-15 ENCOUNTER — Encounter: Payer: Self-pay | Admitting: Family Medicine

## 2017-10-15 ENCOUNTER — Ambulatory Visit: Payer: Medicare Other | Admitting: Internal Medicine

## 2017-10-15 VITALS — BP 120/76 | HR 49 | Ht 73.5 in | Wt 191.0 lb

## 2017-10-15 DIAGNOSIS — M5416 Radiculopathy, lumbar region: Secondary | ICD-10-CM

## 2017-10-15 MED ORDER — VENLAFAXINE HCL ER 37.5 MG PO CP24: 37.5000 mg | ORAL_CAPSULE | Freq: Every day | ORAL | 1 refills | Status: DC

## 2017-10-15 NOTE — Assessment & Plan Note (Signed)
Patient does have a left-sided L4 nerve root impingement.  Discussed with patient at great length.  Patient has done relatively well over the course of time but now seems to have plateaued.  Patient is no longer responding to the epidural or the nerve root injections.  I feel that radiofrequency ablation could be of benefit versus a possible stimulator.  Patient will be referred to Dr. Maryjean Ka for further evaluation.  Patient felt like he was doing better on the Effexor and will decrease to 37.5 mg.  Responded short course of the osteopathic manipulation and patient can come back if he feels that it has been beneficial.  Encourage patient to write if any questions.  Follow-up again as needed

## 2017-10-15 NOTE — Patient Instructions (Addendum)
Good to see you  Ice is your friend Let hold on running Go back to effexor 37.5 mg  We will have you see Dr. Maryjean Ka. See if radiofrequency ablation or stimulator will be helpful  Send me a message in 3 weeks to tell me how you are doing.

## 2017-10-16 DIAGNOSIS — J3089 Other allergic rhinitis: Secondary | ICD-10-CM | POA: Diagnosis not present

## 2017-10-16 DIAGNOSIS — J301 Allergic rhinitis due to pollen: Secondary | ICD-10-CM | POA: Diagnosis not present

## 2017-10-30 DIAGNOSIS — J3089 Other allergic rhinitis: Secondary | ICD-10-CM | POA: Diagnosis not present

## 2017-10-30 DIAGNOSIS — J301 Allergic rhinitis due to pollen: Secondary | ICD-10-CM | POA: Diagnosis not present

## 2017-11-12 ENCOUNTER — Other Ambulatory Visit: Payer: Self-pay | Admitting: *Deleted

## 2017-11-12 MED ORDER — VENLAFAXINE HCL ER 37.5 MG PO CP24: 37.5000 mg | ORAL_CAPSULE | Freq: Every day | ORAL | 1 refills | Status: DC

## 2017-11-25 DIAGNOSIS — J3089 Other allergic rhinitis: Secondary | ICD-10-CM | POA: Diagnosis not present

## 2017-11-25 DIAGNOSIS — J301 Allergic rhinitis due to pollen: Secondary | ICD-10-CM | POA: Diagnosis not present

## 2017-12-04 DIAGNOSIS — M47816 Spondylosis without myelopathy or radiculopathy, lumbar region: Secondary | ICD-10-CM | POA: Diagnosis not present

## 2017-12-13 DIAGNOSIS — J301 Allergic rhinitis due to pollen: Secondary | ICD-10-CM | POA: Diagnosis not present

## 2017-12-13 DIAGNOSIS — J3089 Other allergic rhinitis: Secondary | ICD-10-CM | POA: Diagnosis not present

## 2017-12-25 DIAGNOSIS — J3089 Other allergic rhinitis: Secondary | ICD-10-CM | POA: Diagnosis not present

## 2017-12-25 DIAGNOSIS — J301 Allergic rhinitis due to pollen: Secondary | ICD-10-CM | POA: Diagnosis not present

## 2018-01-10 DIAGNOSIS — J301 Allergic rhinitis due to pollen: Secondary | ICD-10-CM | POA: Diagnosis not present

## 2018-01-10 DIAGNOSIS — J3089 Other allergic rhinitis: Secondary | ICD-10-CM | POA: Diagnosis not present

## 2018-01-14 DIAGNOSIS — Z23 Encounter for immunization: Secondary | ICD-10-CM | POA: Diagnosis not present

## 2018-02-04 DIAGNOSIS — J3089 Other allergic rhinitis: Secondary | ICD-10-CM | POA: Diagnosis not present

## 2018-02-04 DIAGNOSIS — J301 Allergic rhinitis due to pollen: Secondary | ICD-10-CM | POA: Diagnosis not present

## 2018-02-07 DIAGNOSIS — L821 Other seborrheic keratosis: Secondary | ICD-10-CM | POA: Diagnosis not present

## 2018-02-07 DIAGNOSIS — D225 Melanocytic nevi of trunk: Secondary | ICD-10-CM | POA: Diagnosis not present

## 2018-02-07 DIAGNOSIS — L820 Inflamed seborrheic keratosis: Secondary | ICD-10-CM | POA: Diagnosis not present

## 2018-02-20 DIAGNOSIS — J301 Allergic rhinitis due to pollen: Secondary | ICD-10-CM | POA: Diagnosis not present

## 2018-02-20 DIAGNOSIS — J3089 Other allergic rhinitis: Secondary | ICD-10-CM | POA: Diagnosis not present

## 2018-03-03 ENCOUNTER — Ambulatory Visit: Payer: Medicare Other | Admitting: Cardiovascular Disease

## 2018-03-03 ENCOUNTER — Ambulatory Visit (INDEPENDENT_AMBULATORY_CARE_PROVIDER_SITE_OTHER): Payer: Medicare Other | Admitting: Cardiovascular Disease

## 2018-03-03 ENCOUNTER — Encounter: Payer: Self-pay | Admitting: Cardiovascular Disease

## 2018-03-03 ENCOUNTER — Other Ambulatory Visit: Payer: Self-pay | Admitting: *Deleted

## 2018-03-03 VITALS — BP 108/72 | HR 47 | Ht 73.0 in | Wt 187.1 lb

## 2018-03-03 DIAGNOSIS — I2510 Atherosclerotic heart disease of native coronary artery without angina pectoris: Secondary | ICD-10-CM | POA: Diagnosis not present

## 2018-03-03 MED ORDER — ASPIRIN EC 81 MG PO TBEC: 81.0000 mg | DELAYED_RELEASE_TABLET | Freq: Every day | ORAL | 3 refills | Status: DC

## 2018-03-03 MED ORDER — GABAPENTIN 100 MG PO CAPS: ORAL_CAPSULE | ORAL | 1 refills | Status: DC

## 2018-03-03 NOTE — Patient Instructions (Signed)
Medication Instructions:  1) START ASPIRIN 81 mg daily  Labwork: None  Testing/Procedures: None  Follow-Up: Your provider wants you to follow-up in: 1 year with Dr. Burt Knack. You will receive a reminder letter in the mail two months in advance. If you don't receive a letter, please call our office to schedule the follow-up appointment.    Any Other Special Instructions Will Be Listed Below (If Applicable).     If you need a refill on your cardiac medications before your next appointment, please call your pharmacy.

## 2018-03-03 NOTE — Progress Notes (Signed)
Cardiology Office Note Date:  03/05/2018   ID:  WILLIM Ellis, DOB 1946-07-15, MRN [id number]  PCP:  Cassandria Anger, MD  Cardiologist:  [name], MD    Chief Complaint  Patient presents with  . Fatigue     History of Present Illness: Richard Ellis is a 72 y.o. male who presents for follow-up of coronary artery disease.  The patient initially underwent stenting of the LAD in 2017.  He developed severe edge restenosis and because of progressive calcification and tortuosity of stent could not be delivered to the lesion.  He ultimately went for CABG with a LIMA to LAD and saphenous vein graft to first diagonal as well as surgical closure of his known PFO.  The patient is here alone today.  He is been doing fine.  He complains of generalized fatigue and problems with low back pain which is his major physical limitation.  He denies chest pain or shortness of breath.  He has some numbness on the left chest near his sternotomy.  No orthopnea, PND, or leg swelling.   Past Medical History:  Diagnosis Date  . Allergy    rhinitis  . Anemia    "Hgb always on the low side" (07/02/2013)  . CAD (coronary artery disease)    a. LHC 2/17: pLAD 50, mLAD 95, pRCA 30, dRCA 25, EF 50-55% >> PCI:  3 x 18 mm Resolute DES to mLAD   . Chronic bronchitis ([place])    "not in the last year since I started taking allergy shots" (10/09/2016)  . Chronic lower back pain   . Chronic sinus bradycardia 07/02/2013  . Coronary artery disease 10/15/2016  . Cryptogenic stroke ([place]) 07/02/2013   a. "small ischemic occipital right sided" (07/02/2013)  //  b. Event monitor 10/14: sinus brady  //  c. Carotid US 10/14: bilat ICA 1-39%  . Esophageal stricture   . Exertional angina ([place]) 10/09/2016  . Heart murmur    "as a child" (07/02/2013)  . History of echocardiogram    a. Echo 10/14: EF 60-65%, no RWMA, normal diastolic function  . Hyperlipidemia   . Hypothyroidism   . Nonallopathic lesion of lumbosacral region  06/04/2017  . Nonallopathic lesion of sacral region 06/04/2017  . Nonallopathic lesion of thoracic region 06/04/2017  . Osteoarthritis    "lower back" (10/09/2016)  . PFO (patent foramen ovale)    a. TEE 11/14: mild LVH, EF 60-65%, small PFO, R-L shunt    Past Surgical History:  Procedure Laterality Date  . CARDIAC CATHETERIZATION N/A 11/10/2015   Procedure: Left Heart Cath and Coronary Angiography;  Surgeon: [name], MD;  Location: [facility];  Service: Cardiovascular;  Laterality: N/A;  . CARDIAC CATHETERIZATION N/A 11/10/2015   Procedure: Coronary Stent Intervention;  Surgeon: [name], MD;  Location: [facility];  Service: Cardiovascular;  Laterality: N/A;  mid lad  3.0x18 resolute  . CARDIAC CATHETERIZATION  10/09/2016   "scheduled OHS for tomorrow" (10/09/2016)  . CARDIAC CATHETERIZATION N/A 10/09/2016   Procedure: Left Heart Cath and Coronary Angiography;  Surgeon: [name], MD;  Location: [facility];  Service: Cardiovascular;  Laterality: N/A;  . CARDIAC CATHETERIZATION N/A 10/09/2016   Procedure: Coronary Balloon Angioplasty;  Surgeon: [name], MD;  Location: [facility];  Service: Cardiovascular;  Laterality: N/A;  . CORONARY ARTERY BYPASS GRAFT N/A 10/15/2016   Procedure: CORONARY ARTERY BYPASS GRAFTING (CABG), ON PUMP, TIMES TWO, USING LEFT INTERNAL MAMMARY ARTERY AND  RIGHT GREATER SAPHENOUS VEIN HARVESTED ENDOSCOPICALLY;  Surgeon: Ivin Poot, MD;  Location: [facility];  Service: Open Heart Surgery;  Laterality: N/A;  LIMA to LAD, SVG to DIAGONAL  . ESOPHAGOGASTRODUODENOSCOPY N/A 10/26/2012   Procedure: ESOPHAGOGASTRODUODENOSCOPY (EGD);  Surgeon: Winfield Cunas., MD;  Location: [facility];  Service: Endoscopy;  Laterality: N/A;  . ESOPHAGOGASTRODUODENOSCOPY N/A 07/08/2013   Procedure: ESOPHAGOGASTRODUODENOSCOPY (EGD);  Surgeon: Winfield Cunas., MD;  Location: [facility];  Service: Endoscopy;  Laterality: N/A;  . INNER EAR  SURGERY Left 1957   "related to ear infection"  . REPAIR OF PATENT FORAMEN OVALE N/A 10/15/2016   Procedure: REPAIR OF PATENT FORAMEN OVALE;  Surgeon: Ivin Poot, MD;  Location: [place];  Service: Open Heart Surgery;  Laterality: N/A;  . SAVORY DILATION N/A 07/08/2013   Procedure: SAVORY DILATION;  Surgeon: Winfield Cunas., MD;  Location: [facility];  Service: Endoscopy;  Laterality: N/A;  . TEE WITHOUT CARDIOVERSION N/A 07/28/2013   Procedure: TRANSESOPHAGEAL ECHOCARDIOGRAM (TEE);  Surgeon: Pixie Casino, MD;  Location: [facility];  Service: Cardiovascular;  Laterality: N/A;  . TEE WITHOUT CARDIOVERSION N/A 10/15/2016   Procedure: TRANSESOPHAGEAL ECHOCARDIOGRAM (TEE);  Surgeon: Ivin Poot, MD;  Location: [place];  Service: Open Heart Surgery;  Laterality: N/A;  . TONSILLECTOMY  1950's    Current Outpatient Medications  Medication Sig Dispense Refill  . Cholecalciferol 2000 UNITS CAPS Take 2,000 Units by mouth every evening.     . Ferrous Sulfate (IRON) 325 (65 Fe) MG TABS Take 1 tablet by mouth daily.    [name] ibuprofen (IBU) 400 MG tablet Take 400 mg by mouth every 6 (six) hours as needed.    [name] levothyroxine (SYNTHROID, LEVOTHROID) 25 MCG tablet TAKE 1 TABLET BY MOUTH DAILY BEFORE BREAKFAST. 90 tablet 3  . Omega-3 Fatty Acids (FISH OIL) 1200 MG CAPS Take 1,200 mg by mouth daily.     . Turmeric 500 MG CAPS Take 1 capsule by mouth at bedtime.    [name] aspirin EC 81 MG tablet Take 1 tablet (81 mg total) by mouth daily. 90 tablet 3  . gabapentin (NEURONTIN) 100 MG capsule TAKE ONE CAPSULE BY MOUTH TWICE A DAY - 180 capsule 1   No current facility-administered medications for this visit.     Allergies:   Atorvastatin; Crestor [rosuvastatin]; Demerol [meperidine]; Niacin and related; and Repatha [evolocumab]   Social History:  The patient  reports that he has never smoked. He has never used smokeless tobacco. He reports that he drinks about 4.8 oz of alcohol per week. He reports that  he does not use drugs.   Family History:  The patient's family history includes Coronary artery disease in his other; Heart disease in his father.    ROS:  Please see the history of present illness.  Otherwise, review of systems is positive for back pain.  All other systems are reviewed and negative.    PHYSICAL EXAM: VS:  BP 108/72   Pulse (!) 47   Ht 6\' 1"  (1.854 m)   Wt 187 lb 1.9 oz (84.9 kg)   SpO2 98%   BMI 24.69 kg/m  , BMI Body mass index is 24.69 kg/m. GEN: Well nourished, well developed, in no acute distress  HEENT: normal  Neck: no JVD, no masses. No carotid bruits Cardiac: bradycardiac and regular without murmur or gallop                Respiratory:  clear to auscultation bilaterally,  normal work of breathing GI: soft, nontender, nondistended, + BS MS: no deformity or atrophy  Ext: no pretibial edema, pedal pulses 2+= bilaterally Skin: warm and dry, no rash Neuro:  Strength and sensation are intact Psych: euthymic mood, full affect  EKG:  EKG is ordered today. The ekg ordered today shows sinus bradycardia 47 bpm, possible age-indeterminate anterolateral infarct, no significant change from previous.  Recent Labs: 05/09/2017: Hemoglobin 14.0; Platelets 190.0; TSH 2.28   Lipid Panel     Component Value Date/Time   CHOL 122 (L) 04/18/2016 0816   CHOL 230 (H) 11/09/2014 1200   TRIG 115 04/18/2016 0816   TRIG 152 (H) 11/09/2014 1200   TRIG 228 (HH) 07/30/2006 0741   HDL 52 04/18/2016 0816   HDL 64 11/09/2014 1200   CHOLHDL 2.3 04/18/2016 0816   VLDL 23 04/18/2016 0816   LDLCALC 47 04/18/2016 0816   LDLCALC 136 (H) 11/09/2014 1200   LDLDIRECT 122.0 05/09/2015 1006      Wt Readings from Last 3 Encounters:  03/03/18 187 lb 1.9 oz (84.9 kg)  10/15/17 191 lb (86.6 kg)  09/03/17 183 lb (83 kg)     Cardiac Studies Reviewed: TEE 10/15/2016:  Left ventricle: Normal cavity size and wall thickness. LV systolic function is normal with an EF of 55-60%. There are  no obvious wall motion abnormalities.  Septum: No shunt visualized post-repair No shunt visualized post-repair . Small Patent Foramen Ovale present with left to right shunt visualized by color doppler.  Left atrium: Patent foramen ovale present with left to right shunting indicated by color flow Doppler.  Aortic valve: The valve is trileaflet. No stenosis. Trace regurgitation.  Mitral valve: Trace regurgitation.  Right ventricle: Normal cavity size and ejection fraction.  Pulmonic valve: Trace regurgitation.  ASSESSMENT AND PLAN: 1.  CAD, native vessel, without angina: The patient is not taking aspirin.  I asked him to restart aspirin 81 mg daily.  He cannot tolerate beta-blockers because of low resting heart rate.  2.  PFO with history of cryptogenic stroke: Status post surgical closure with no residual shunt noted.  3.  Hyperlipidemia: Intolerant to all statin agents as well as PCSK9 inhibitors.  Continue with lifestyle modification.  4.  Marked sinus bradycardia: Appears to be asymptomatic.  Long-standing with no acute change.  Current medicines are reviewed with the patient today.  The patient does not have concerns regarding medicines.  Labs/ tests ordered today include:   Orders Placed This Encounter  Procedures  . EKG 12-Lead    Disposition:   FU one year  Signed, [name], MD  03/05/2018 10:29 PM    [address], [place], [place]  [address] Phone: [phone]; Fax: [phone]

## 2018-03-03 NOTE — Telephone Encounter (Signed)
rx sent into pharmacy

## 2018-03-04 IMAGING — XA Imaging study
2 series · 2 of 2 positions shown · non-contrast
Comparison: none

CLINICAL DATA: Low back and bilateral hip pain, left worse than
right. Symptoms occur with exercise.

[Series 1: ortho standard · 1 of 1 slices shown (1 of 2)]
[im 1/1]
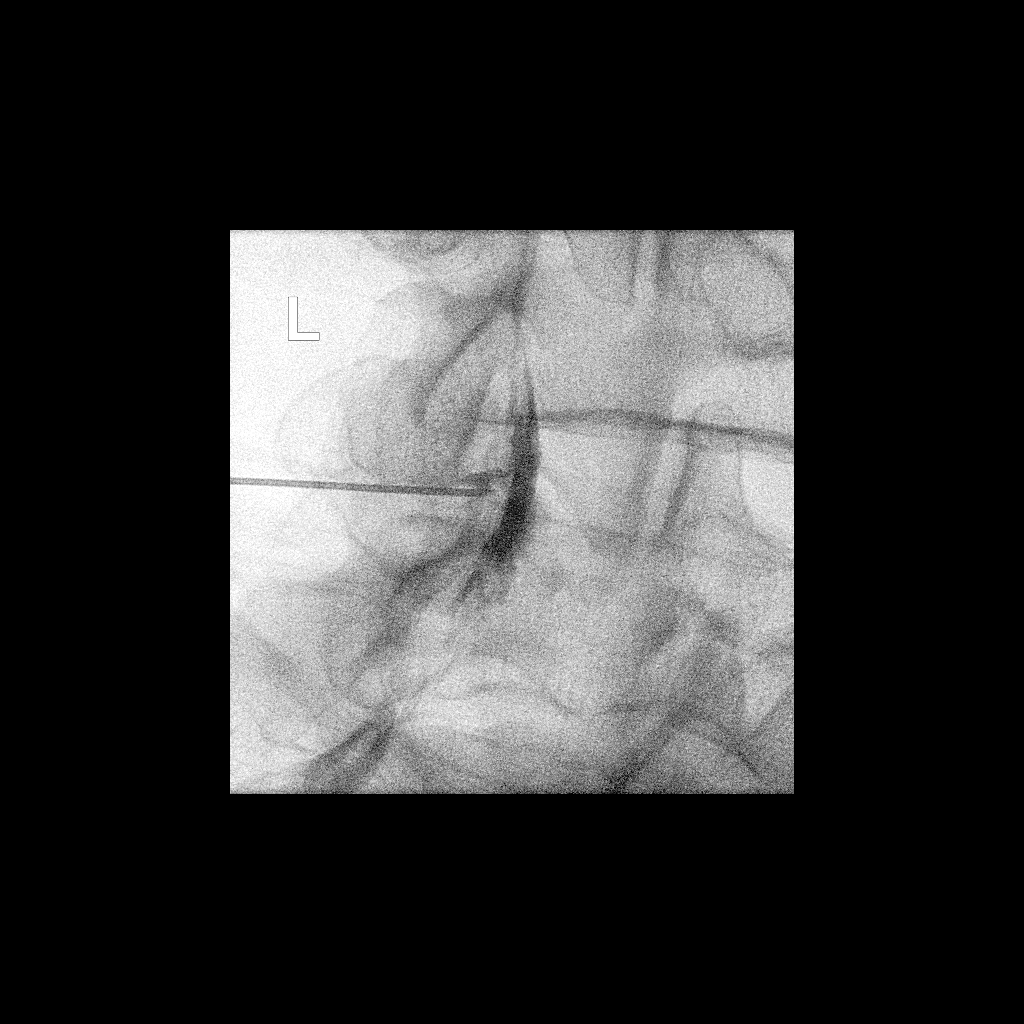

[Series 2: ortho standard · 1 of 1 slices shown (2 of 2)]
[im 1/1]
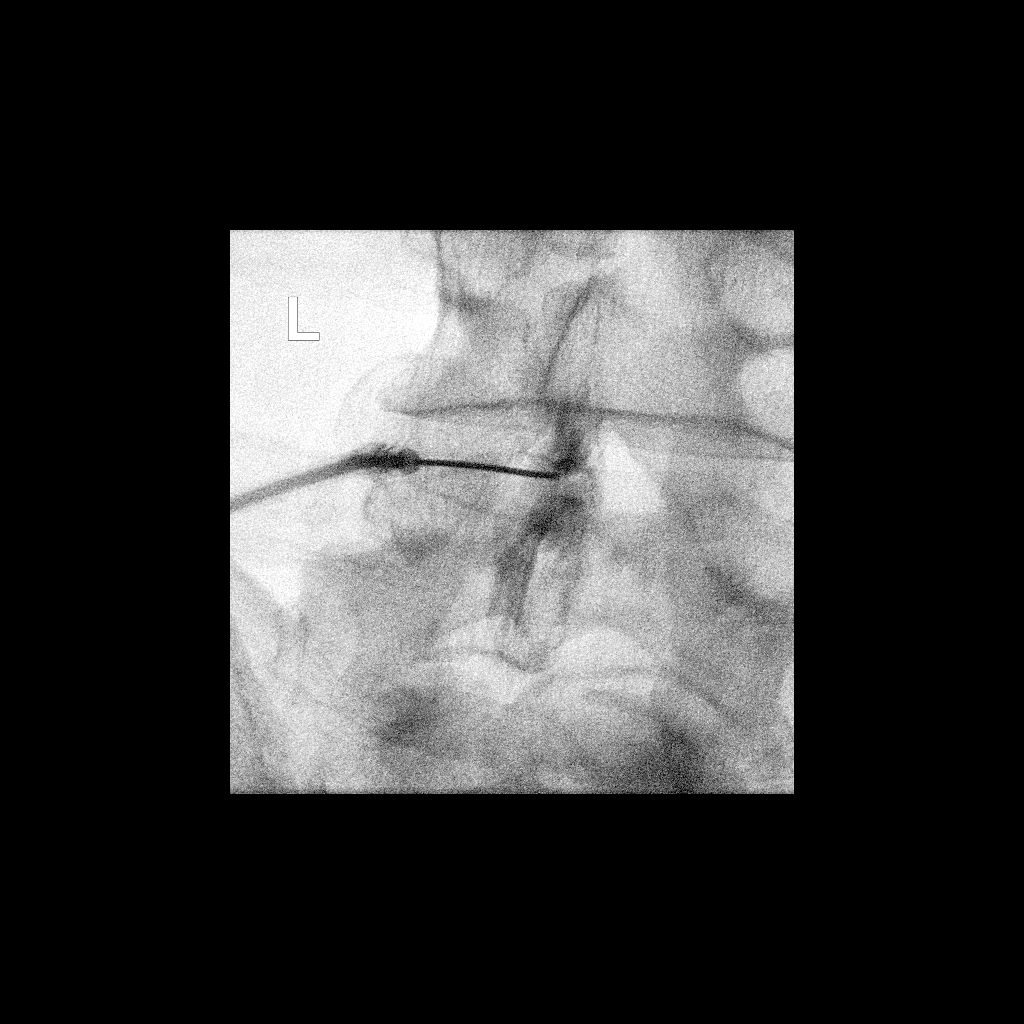

[2 of 2 positions shown; findings below may reference images not displayed]

FLUOROSCOPY TIME:  0 minutes 37 seconds. 26.47 micro gray meter
squared

PROCEDURE:
The procedure, risks, benefits, and alternatives were explained to
the patient. Questions regarding the procedure were encouraged and
answered. The patient understands and consents to the procedure.

LUMBAR EPIDURAL INJECTION:

An interlaminar approach was performed on the left at L4-5. The
overlying skin was cleansed and anesthetized. A 20 gauge epidural
needle was advanced using loss-of-resistance technique.

DIAGNOSTIC EPIDURAL INJECTION:

Injection of Isovue-M 200 shows a good epidural pattern with spread
above and below the level of needle placement, primarily on the
left. No vascular opacification is seen.

THERAPEUTIC EPIDURAL INJECTION:

One hundred twenty Mg of Depo-Medrol mixed with 2 cc 1% lidocaine
were instilled. The procedure was well-tolerated, and the patient
was discharged thirty minutes following the injection in good
condition.

COMPLICATIONS:
None
IMPRESSION: Technically successful epidural injection on the left at L4-5 # 1.

## 2018-03-07 DIAGNOSIS — J301 Allergic rhinitis due to pollen: Secondary | ICD-10-CM | POA: Diagnosis not present

## 2018-03-07 DIAGNOSIS — J3089 Other allergic rhinitis: Secondary | ICD-10-CM | POA: Diagnosis not present

## 2018-03-19 ENCOUNTER — Encounter: Payer: Self-pay | Admitting: Family Medicine

## 2018-03-19 DIAGNOSIS — J3089 Other allergic rhinitis: Secondary | ICD-10-CM | POA: Diagnosis not present

## 2018-03-19 DIAGNOSIS — J301 Allergic rhinitis due to pollen: Secondary | ICD-10-CM | POA: Diagnosis not present

## 2018-04-07 NOTE — Progress Notes (Signed)
[facility] [address] [place], [place] [address] Phone: [phone] Subjective:      CC: Back pain follow-up  [id number]  Richard Ellis is a 72 y.o. male coming in with complaint of back pain. States that about 3 weeks ago he was doing yard work when he felt the pain. States that the pain isn't as bad. Has stopped running. Was numbness and tingling.  States pain is even affecting daily activities and keep him up at night.  Patient had responded fairly well to an L4 nerve root injection previously.  Patient was having more pain that seems to be bilateral of the legs.  Patient denies any fevers chills or any abnormal weight loss.  Patient was to be doing well with the Effexor which patient is taking intermittently.  Patient had seen another provider but they decided not to do any other type of injection such as radiofrequency ablation.  Has attempted osteopathic manipulation in the past with varying degrees of success      Past Medical History:  Diagnosis Date  . Allergy    rhinitis  . Anemia    "Hgb always on the low side" (07/02/2013)  . CAD (coronary artery disease)    a. LHC 2/17: pLAD 50, mLAD 95, pRCA 30, dRCA 25, EF 50-55% >> PCI:  3 x 18 mm Resolute DES to mLAD   . Chronic bronchitis ([place])    "not in the last year since I started taking allergy shots" (10/09/2016)  . Chronic lower back pain   . Chronic sinus bradycardia 07/02/2013  . Coronary artery disease 10/15/2016  . Cryptogenic stroke ([place]) 07/02/2013   a. "small ischemic occipital right sided" (07/02/2013)  //  b. Event monitor 10/14: sinus brady  //  c. Carotid US 10/14: bilat ICA 1-39%  . Esophageal stricture   . Exertional angina ([place]) 10/09/2016  . Heart murmur    "as a child" (07/02/2013)  . History of echocardiogram    a. Echo 10/14: EF 60-65%, no RWMA, normal diastolic function  . Hyperlipidemia   . Hypothyroidism   . Nonallopathic lesion of lumbosacral region 06/04/2017  .  Nonallopathic lesion of sacral region 06/04/2017  . Nonallopathic lesion of thoracic region 06/04/2017  . Osteoarthritis    "lower back" (10/09/2016)  . PFO (patent foramen ovale)    a. TEE 11/14: mild LVH, EF 60-65%, small PFO, R-L shunt   Past Surgical History:  Procedure Laterality Date  . CARDIAC CATHETERIZATION N/A 11/10/2015   Procedure: Left Heart Cath and Coronary Angiography;  Surgeon: [name], MD;  Location: [facility];  Service: Cardiovascular;  Laterality: N/A;  . CARDIAC CATHETERIZATION N/A 11/10/2015   Procedure: Coronary Stent Intervention;  Surgeon: [name], MD;  Location: [facility];  Service: Cardiovascular;  Laterality: N/A;  mid lad  3.0x18 resolute  . CARDIAC CATHETERIZATION  10/09/2016   "scheduled OHS for tomorrow" (10/09/2016)  . CARDIAC CATHETERIZATION N/A 10/09/2016   Procedure: Left Heart Cath and Coronary Angiography;  Surgeon: [name], MD;  Location: [place] [facility];  Service: Cardiovascular;  Laterality: N/A;  . CARDIAC CATHETERIZATION N/A 10/09/2016   Procedure: Coronary Balloon Angioplasty;  Surgeon: [name], MD;  Location: [facility];  Service: Cardiovascular;  Laterality: N/A;  . CORONARY ARTERY BYPASS GRAFT N/A 10/15/2016   Procedure: CORONARY ARTERY BYPASS GRAFTING (CABG), ON PUMP, TIMES TWO, USING LEFT INTERNAL MAMMARY ARTERY AND RIGHT GREATER SAPHENOUS VEIN HARVESTED ENDOSCOPICALLY;  Surgeon: Collier Salina  Prescott Gum, MD;  Location: [facility];  Service: Open Heart Surgery;  Laterality: N/A;  LIMA to LAD, SVG to DIAGONAL  . ESOPHAGOGASTRODUODENOSCOPY N/A 10/26/2012   Procedure: ESOPHAGOGASTRODUODENOSCOPY (EGD);  Surgeon: Winfield Cunas., MD;  Location: [facility];  Service: Endoscopy;  Laterality: N/A;  . ESOPHAGOGASTRODUODENOSCOPY N/A 07/08/2013   Procedure: ESOPHAGOGASTRODUODENOSCOPY (EGD);  Surgeon: Winfield Cunas., MD;  Location: [facility];  Service: Endoscopy;  Laterality: N/A;  . INNER EAR SURGERY Left  1957   "related to ear infection"  . REPAIR OF PATENT FORAMEN OVALE N/A 10/15/2016   Procedure: REPAIR OF PATENT FORAMEN OVALE;  Surgeon: Ivin Poot, MD;  Location: [facility];  Service: Open Heart Surgery;  Laterality: N/A;  . SAVORY DILATION N/A 07/08/2013   Procedure: SAVORY DILATION;  Surgeon: Winfield Cunas., MD;  Location: [facility];  Service: Endoscopy;  Laterality: N/A;  . TEE WITHOUT CARDIOVERSION N/A 07/28/2013   Procedure: TRANSESOPHAGEAL ECHOCARDIOGRAM (TEE);  Surgeon: Pixie Casino, MD;  Location[facility];  Service: Cardiovascular;  Laterality: N/A;  . TEE WITHOUT CARDIOVERSION N/A 10/15/2016   Procedure: TRANSESOPHAGEAL ECHOCARDIOGRAM (TEE);  Surgeon: Ivin Poot, MD;  Location: [facility];  Service: Open Heart Surgery;  Laterality: N/A;  . TONSILLECTOMY  1950's   Social History   Socioeconomic History  . Marital status: Married    Spouse name: Not on file  . Number of children: 1  . Years of education: Not on file  . Highest education level: Not on file  Occupational History  . Occupation: Retired  Scientific laboratory technician  . Financial resource strain: Not on file  . Food insecurity:    Worry: Not on file    Inability: Not on file  . Transportation needs:    Medical: Not on file    Non-medical: Not on file  Tobacco Use  . Smoking status: Never Smoker  . Smokeless tobacco: Never Used  Substance and Sexual Activity  . Alcohol use: Yes    Alcohol/week: 4.8 oz    Types: 4 Glasses of wine, 4 Cans of beer per week    Comment: weekly  . Drug use: No  . Sexual activity: Yes  Lifestyle  . Physical activity:    Days per week: Not on file    Minutes per session: Not on file  . Stress: Not on file  Relationships  . Social connections:    Talks on phone: Not on file    Gets together: Not on file    Attends religious service: Not on file    Active member of club or organization: Not on file    Attends meetings of clubs or organizations: Not on file    Relationship  status: Not on file  Other Topics Concern  . Not on file  Social History Narrative  . Not on file   Allergies  Allergen Reactions  . Atorvastatin Other (See Comments)    Muscle weakness  . Crestor [Rosuvastatin] Other (See Comments)    Muscle weakness  . Demerol [Meperidine] Nausea Only  . Niacin And Related Itching, Rash and Other (See Comments)    Palms turned red and itched  . Repatha [Evolocumab] Other (See Comments)    Muscle weakness   Family History  Problem Relation Age of Onset  . Coronary artery disease Other        male first degree relative <60  . Heart disease Father        heart attack  . Colon cancer  Neg Hx      Past medical history, social, surgical and family history all reviewed in electronic medical record.  No pertanent information unless stated regarding to the chief complaint.   Review of Systems:Review of systems updated and as accurate as of 04/08/18  No headache, visual changes, nausea, vomiting, diarrhea, constipation, dizziness, abdominal pain, skin rash, fevers, chills, night sweats, weight loss, swollen lymph nodes, body aches, joint swelling,  chest pain, shortness of breath, mood changes.  Positive muscle aches  Objective  Blood pressure 118/72, pulse (!) 54, height 6\' 1"  (1.854 m), weight 186 lb (84.4 kg), SpO2 98 %. Systems examined below as of 04/08/18   General: No apparent distress alert and oriented x3 mood and affect normal, dressed appropriately.  HEENT: Pupils equal, extraocular movements intact  Respiratory: Patient's speak in full sentences and does not appear short of breath  Cardiovascular: No lower extremity edema, non tender, no erythema  Skin: Warm dry intact with no signs of infection or rash on extremities or on axial skeleton.  Abdomen: Soft nontender  Neuro: Cranial nerves II through XII are intact, neurovascularly intact in all extremities with 2+ DTRs and 2+ pulses.  Lymph: No lymphadenopathy of posterior or anterior  cervical chain or axillae bilaterally.  Gait normal with good balance and coordination.  MSK:  Non tender with full range of motion and good stability and symmetric strength and tone of shoulders, elbows, wrist, hip, knee and ankles bilaterally.  Back Exam:  Inspection: Unremarkable  Motion: Flexion 35 deg, Extension 25 deg, Side Bending to 30 deg bilaterally,  Rotation to 35 deg bilaterally  SLR laying: Bilateral positive XSLR laying: Negative  Palpable tenderness: Moderate to severe tenderness to palpation in the paraspinal musculature lumbar spine bilaterally. FABER: Tightness and pain bilaterally. Sensory change: Gross sensation intact to all lumbar and sacral dermatomes.  Reflexes: 2+ at both patellar tendons, 2+ at achilles tendons, Babinski's downgoing.  Strength at foot  Plantar-flexion: 5/5 Dorsi-flexion: 5/5 Eversion: 5/5 Inversion: 5/5  Leg strength  Quad: 5/5 Hamstring: 5/5 Hip flexor: 5/5 Hip abductors: 4/5 but symmetric Gait unremarkable.    Impression and Recommendations:     This case required medical decision making of moderate complexity.      Note: This dictation was prepared with Dragon dictation along with smaller phrase technology. Any transcriptional errors that result from this process are unintentional.

## 2018-04-08 ENCOUNTER — Encounter: Payer: Self-pay | Admitting: Family Medicine

## 2018-04-08 ENCOUNTER — Encounter

## 2018-04-08 ENCOUNTER — Ambulatory Visit (INDEPENDENT_AMBULATORY_CARE_PROVIDER_SITE_OTHER): Payer: Medicare Other | Admitting: Family Medicine

## 2018-04-08 VITALS — BP 118/72 | HR 54 | Ht 73.0 in | Wt 186.0 lb

## 2018-04-08 DIAGNOSIS — I2510 Atherosclerotic heart disease of native coronary artery without angina pectoris: Secondary | ICD-10-CM | POA: Diagnosis not present

## 2018-04-08 DIAGNOSIS — M549 Dorsalgia, unspecified: Secondary | ICD-10-CM | POA: Diagnosis not present

## 2018-04-08 DIAGNOSIS — J301 Allergic rhinitis due to pollen: Secondary | ICD-10-CM | POA: Diagnosis not present

## 2018-04-08 DIAGNOSIS — J3089 Other allergic rhinitis: Secondary | ICD-10-CM | POA: Diagnosis not present

## 2018-04-08 DIAGNOSIS — G8929 Other chronic pain: Secondary | ICD-10-CM

## 2018-04-08 DIAGNOSIS — M5416 Radiculopathy, lumbar region: Secondary | ICD-10-CM

## 2018-04-08 MED ORDER — METHYLPREDNISOLONE ACETATE 80 MG/ML IJ SUSP
80.0000 mg | Freq: Once | INTRAMUSCULAR | Status: AC
  Administered 2018-04-08: 80 mg via INTRAMUSCULAR

## 2018-04-08 MED ORDER — KETOROLAC TROMETHAMINE 60 MG/2ML IM SOLN
60.0000 mg | Freq: Once | INTRAMUSCULAR | Status: AC
  Administered 2018-04-08: 60 mg via INTRAMUSCULAR

## 2018-04-08 NOTE — Patient Instructions (Signed)
Good to see you  2 injections today  We will try another epidural  Exercises 3 times a week.  Keep hands within peripheral vision.  See me again in 3 weeks AFTER the injection

## 2018-04-08 NOTE — Assessment & Plan Note (Signed)
Lumbar radiculopathy again.  L4 nerve root impingement.  This seems to be worsening and aggravated again.  We discussed icing regimen, home exercise, 2 injections given today and patient will be set up for another epidural.  Hopefully patient does respond.  Follow-up again in 3 weeks after epidural

## 2018-04-10 DIAGNOSIS — J301 Allergic rhinitis due to pollen: Secondary | ICD-10-CM | POA: Diagnosis not present

## 2018-04-10 DIAGNOSIS — J3089 Other allergic rhinitis: Secondary | ICD-10-CM | POA: Diagnosis not present

## 2018-04-23 DIAGNOSIS — J301 Allergic rhinitis due to pollen: Secondary | ICD-10-CM | POA: Diagnosis not present

## 2018-04-23 DIAGNOSIS — J3089 Other allergic rhinitis: Secondary | ICD-10-CM | POA: Diagnosis not present

## 2018-05-06 ENCOUNTER — Ambulatory Visit
Admission: RE | Admit: 2018-05-06 | Discharge: 2018-05-06 | Disposition: A | Discharge status: Home / Self Care | Payer: Medicare Other | Source: Ambulatory Visit | Attending: Family Medicine | Admitting: Family Medicine

## 2018-05-06 DIAGNOSIS — G8929 Other chronic pain: Secondary | ICD-10-CM

## 2018-05-06 DIAGNOSIS — M5416 Radiculopathy, lumbar region: Secondary | ICD-10-CM

## 2018-05-06 DIAGNOSIS — M549 Dorsalgia, unspecified: Secondary | ICD-10-CM

## 2018-05-06 DIAGNOSIS — M47817 Spondylosis without myelopathy or radiculopathy, lumbosacral region: Secondary | ICD-10-CM | POA: Diagnosis not present

## 2018-05-06 MED ORDER — METHYLPREDNISOLONE ACETATE 40 MG/ML INJ SUSP (RADIOLOG
120.0000 mg | Freq: Once | INTRAMUSCULAR | Status: AC
  Administered 2018-05-06: 120 mg via EPIDURAL

## 2018-05-06 MED ORDER — IOPAMIDOL (ISOVUE-M 200) INJECTION 41%
1.0000 mL | Freq: Once | INTRAMUSCULAR | Status: AC
  Administered 2018-05-06: 1 mL via EPIDURAL

## 2018-05-06 NOTE — Discharge Instructions (Signed)

## 2018-05-07 DIAGNOSIS — J301 Allergic rhinitis due to pollen: Secondary | ICD-10-CM | POA: Diagnosis not present

## 2018-05-07 DIAGNOSIS — J3089 Other allergic rhinitis: Secondary | ICD-10-CM | POA: Diagnosis not present

## 2018-05-21 DIAGNOSIS — J3089 Other allergic rhinitis: Secondary | ICD-10-CM | POA: Diagnosis not present

## 2018-05-21 DIAGNOSIS — J301 Allergic rhinitis due to pollen: Secondary | ICD-10-CM | POA: Diagnosis not present

## 2018-05-30 NOTE — Progress Notes (Signed)
[facility] [address], [place] [address] Phone: [phone] Subjective:    I Richard Ellis am serving as a Education administrator for Dr. Hulan Saas.    CC: Back pain follow-up  [id number]  Richard Ellis is a 72 y.o. male coming in with complaint of back pain. States that the back is doing better. Tried running Saturday and states that it was slow and painful.  Patient has been seen previously and does have degenerative disc disease with a nerve root impingement.  Was given a epidural at L4-L5 and can 3 weeks ago.  States that this decrease the pain by 40 to 50%.  Still has some discomfort with some very mild radicular symptoms into the buttocks but not down the leg at the moment.  Denies any weakness.  Feels like making some improvement.     Past Medical History:  Diagnosis Date  . Allergy    rhinitis  . Anemia    "Hgb always on the low side" (07/02/2013)  . CAD (coronary artery disease)    a. LHC 2/17: pLAD 50, mLAD 95, pRCA 30, dRCA 25, EF 50-55% >> PCI:  3 x 18 mm Resolute DES to mLAD   . Chronic bronchitis ([place])    "not in the last year since I started taking allergy shots" (10/09/2016)  . Chronic lower back pain   . Chronic sinus bradycardia 07/02/2013  . Coronary artery disease 10/15/2016  . Cryptogenic stroke ([place]) 07/02/2013   a. "small ischemic occipital right sided" (07/02/2013)  //  b. Event monitor 10/14: sinus brady  //  c. Carotid US 10/14: bilat ICA 1-39%  . Esophageal stricture   . Exertional angina ([place]) 10/09/2016  . Heart murmur    "as a child" (07/02/2013)  . History of echocardiogram    a. Echo 10/14: EF 60-65%, no RWMA, normal diastolic function  . Hyperlipidemia   . Hypothyroidism   . Nonallopathic lesion of lumbosacral region 06/04/2017  . Nonallopathic lesion of sacral region 06/04/2017  . Nonallopathic lesion of thoracic region 06/04/2017  . Osteoarthritis    "lower back" (10/09/2016)  . PFO (patent foramen ovale)    a.  TEE 11/14: mild LVH, EF 60-65%, small PFO, R-L shunt   Past Surgical History:  Procedure Laterality Date  . CARDIAC CATHETERIZATION N/A 11/10/2015   Procedure: Left Heart Cath and Coronary Angiography;  Surgeon: [name], MD;  Location: [facility];  Service: Cardiovascular;  Laterality: N/A;  . CARDIAC CATHETERIZATION N/A 11/10/2015   Procedure: Coronary Stent Intervention;  Surgeon: [name], MD;  Location: [facility];  Service: Cardiovascular;  Laterality: N/A;  mid lad  3.0x18 resolute  . CARDIAC CATHETERIZATION  10/09/2016   "scheduled OHS for tomorrow" (10/09/2016)  . CARDIAC CATHETERIZATION N/A 10/09/2016   Procedure: Left Heart Cath and Coronary Angiography;  Surgeon: [name], MD;  Location: [facility];  Service: Cardiovascular;  Laterality: N/A;  . CARDIAC CATHETERIZATION N/A 10/09/2016   Procedure: Coronary Balloon Angioplasty;  Surgeon: [name], MD;  Location: [facility];  Service: Cardiovascular;  Laterality: N/A;  . CORONARY ARTERY BYPASS GRAFT N/A 10/15/2016   Procedure: CORONARY ARTERY BYPASS GRAFTING (CABG), ON PUMP, TIMES TWO, USING LEFT INTERNAL MAMMARY ARTERY AND RIGHT GREATER SAPHENOUS VEIN HARVESTED ENDOSCOPICALLY;  Surgeon: Ivin Poot, MD;  Location: [facility];  Service: Open Heart Surgery;  Laterality: N/A;  LIMA to LAD, SVG to DIAGONAL  . ESOPHAGOGASTRODUODENOSCOPY N/A 10/26/2012  Procedure: ESOPHAGOGASTRODUODENOSCOPY (EGD);  Surgeon: Winfield Cunas., MD;  Location: [facility] ENDOSCOPY;  Service: Endoscopy;  Laterality: N/A;  . ESOPHAGOGASTRODUODENOSCOPY N/A 07/08/2013   Procedure: ESOPHAGOGASTRODUODENOSCOPY (EGD);  Surgeon: Winfield Cunas., MD;  Location: [facility];  Service: Endoscopy;  Laterality: N/A;  . INNER EAR SURGERY Left 1957   "related to ear infection"  . REPAIR OF PATENT FORAMEN OVALE N/A 10/15/2016   Procedure: REPAIR OF PATENT FORAMEN OVALE;  Surgeon: Ivin Poot, MD;  Location: [facility];  Service:  Open Heart Surgery;  Laterality: N/A;  . SAVORY DILATION N/A 07/08/2013   Procedure: SAVORY DILATION;  Surgeon: Winfield Cunas., MD;  Location: [facility];  Service: Endoscopy;  Laterality: N/A;  . TEE WITHOUT CARDIOVERSION N/A 07/28/2013   Procedure: TRANSESOPHAGEAL ECHOCARDIOGRAM (TEE);  Surgeon: Pixie Casino, MD;  Location: [facility] ENDOSCOPY;  Service: Cardiovascular;  Laterality: N/A;  . TEE WITHOUT CARDIOVERSION N/A 10/15/2016   Procedure: TRANSESOPHAGEAL ECHOCARDIOGRAM (TEE);  Surgeon: Ivin Poot, MD;  Location: [facility];  Service: Open Heart Surgery;  Laterality: N/A;  . TONSILLECTOMY  1950's   Social History   Socioeconomic History  . Marital status: Married    Spouse name: Not on file  . Number of children: 1  . Years of education: Not on file  . Highest education level: Not on file  Occupational History  . Occupation: Retired  Scientific laboratory technician  . Financial resource strain: Not on file  . Food insecurity:    Worry: Not on file    Inability: Not on file  . Transportation needs:    Medical: Not on file    Non-medical: Not on file  Tobacco Use  . Smoking status: Never Smoker  . Smokeless tobacco: Never Used  Substance and Sexual Activity  . Alcohol use: Yes    Alcohol/week: 8.0 standard drinks    Types: 4 Glasses of wine, 4 Cans of beer per week    Comment: weekly  . Drug use: No  . Sexual activity: Yes  Lifestyle  . Physical activity:    Days per week: Not on file    Minutes per session: Not on file  . Stress: Not on file  Relationships  . Social connections:    Talks on phone: Not on file    Gets together: Not on file    Attends religious service: Not on file    Active member of club or organization: Not on file    Attends meetings of clubs or organizations: Not on file    Relationship status: Not on file  Other Topics Concern  . Not on file  Social History Narrative  . Not on file   Allergies  Allergen Reactions  . Atorvastatin Other (See Comments)      Muscle weakness  . Crestor [Rosuvastatin] Other (See Comments)    Muscle weakness  . Demerol [Meperidine] Nausea Only  . Niacin And Related Itching, Rash and Other (See Comments)    Palms turned red and itched  . Repatha [Evolocumab] Other (See Comments)    Muscle weakness   Family History  Problem Relation Age of Onset  . Coronary artery disease Other        male first degree relative <60  . Heart disease Father        heart attack  . Colon cancer Neg Hx     Current Outpatient Medications (Endocrine & Metabolic):  .  levothyroxine (SYNTHROID, LEVOTHROID) 25 MCG tablet, TAKE 1 TABLET BY MOUTH DAILY  BEFORE BREAKFAST.    Current Outpatient Medications (Analgesics):  .  aspirin EC 81 MG tablet, Take 1 tablet (81 mg total) by mouth daily. [name]  ibuprofen (IBU) 400 MG tablet, Take 400 mg by mouth every 6 (six) hours as needed.  Current Outpatient Medications (Hematological):  [name]  Ferrous Sulfate (IRON) 325 (65 Fe) MG TABS, Take 1 tablet by mouth daily.  Current Outpatient Medications (Other):  [name]  Cholecalciferol 2000 UNITS CAPS, Take 2,000 Units by mouth every evening.  .  gabapentin (NEURONTIN) 100 MG capsule, TAKE ONE CAPSULE BY MOUTH TWICE A DAY - .  Omega-3 Fatty Acids (FISH OIL) 1200 MG CAPS, Take 1,200 mg by mouth daily.  .  Turmeric 500 MG CAPS, Take 1 capsule by mouth at bedtime.    Past medical history, social, surgical and family history all reviewed in electronic medical record.  No pertanent information unless stated regarding to the chief complaint.   Review of Systems:  No headache, visual changes, nausea, vomiting, diarrhea, constipation, dizziness, abdominal pain, skin rash, fevers, chills, night sweats, weight loss, swollen lymph nodes, body aches, joint swelling, chest pain, shortness of breath, mood changes.  Mild positive muscle aches  Objective  Blood pressure 130/70, pulse (!) 51, height 6\' 1"  (1.854 m), weight 187 lb (84.8 kg), SpO2 97 %.    General:  No apparent distress alert and oriented x3 mood and affect normal, dressed appropriately.  HEENT: Pupils equal, extraocular movements intact  Respiratory: Patient's speak in full sentences and does not appear short of breath  Cardiovascular: No lower extremity edema, non tender, no erythema  Skin: Warm dry intact with no signs of infection or rash on extremities or on axial skeleton.  Abdomen: Soft nontender  Neuro: Cranial nerves II through XII are intact, neurovascularly intact in all extremities with 2+ DTRs and 2+ pulses.  Lymph: No lymphadenopathy of posterior or anterior cervical chain or axillae bilaterally.  Gait normal with good balance and coordination.  MSK:  Non tender with full range of motion and good stability and symmetric strength and tone of shoulders, elbows, wrist, hip, knee and ankles bilaterally.  Back Exam:  Inspection: Loss of lordosis Motion: Flexion 45 deg, Extension 25 deg, Side Bending to 35 deg bilaterally, Rotation to 45 deg bilaterally  SLR laying: Negative  XSLR laying: Negative  Palpable tenderness: Tender to palpation the paraspinal muscular lumbar spine left greater than right. FABER: Tightness of the left. Sensory change: Gross sensation intact to all lumbar and sacral dermatomes.  Reflexes: 2+ at both patellar tendons, 2+ at achilles tendons, Babinski's downgoing.  Strength at foot  Plantar-flexion: 5/5 Dorsi-flexion: 5/5 Eversion: 5/5 Inversion: 5/5  Leg strength  Quad: 5/5 Hamstring: 5/5 Hip flexor: 5/5 Hip abductors: 4/5 with symmetric Gait unremarkable.     Impression and Recommendations:     This case required medical decision making of moderate complexity. The above documentation has been reviewed and is accurate and complete Lyndal Pulley, DO       Note: This dictation was prepared with Dragon dictation along with smaller phrase technology. Any transcriptional errors that result from this process are unintentional.

## 2018-06-02 ENCOUNTER — Ambulatory Visit (INDEPENDENT_AMBULATORY_CARE_PROVIDER_SITE_OTHER): Payer: Medicare Other | Admitting: Family Medicine

## 2018-06-02 ENCOUNTER — Encounter: Payer: Self-pay | Admitting: Family Medicine

## 2018-06-02 DIAGNOSIS — M5416 Radiculopathy, lumbar region: Secondary | ICD-10-CM | POA: Diagnosis not present

## 2018-06-02 DIAGNOSIS — I2510 Atherosclerotic heart disease of native coronary artery without angina pectoris: Secondary | ICD-10-CM | POA: Diagnosis not present

## 2018-06-02 NOTE — Assessment & Plan Note (Signed)
Patient doing somewhat better at this moment.  The epidural did seem to be beneficial.  We discussed with him to start increasing activity as tolerated.  Discussed icing regimen and home exercise.  Discussed posture and ergonomics.  Discussed the gabapentin and the dosing.  Patient will follow-up with me again in 4 to 8 weeks

## 2018-06-02 NOTE — Patient Instructions (Signed)
Good to see you  Ice is your friend and after running for sure Consider Alter G machine or water belt.  Ok to run once a week up to 3.5 miles for 2 weeks Then 2 time a week for 2 weeks Then up to 3 times a week but back off on the biking.   Continue the gabapentin 1-3 pills at night  Consider the field instead of the track  See em again in 4 weeks and lets check in

## 2018-06-04 DIAGNOSIS — J301 Allergic rhinitis due to pollen: Secondary | ICD-10-CM | POA: Diagnosis not present

## 2018-06-04 DIAGNOSIS — J3089 Other allergic rhinitis: Secondary | ICD-10-CM | POA: Diagnosis not present

## 2018-06-20 DIAGNOSIS — J301 Allergic rhinitis due to pollen: Secondary | ICD-10-CM | POA: Diagnosis not present

## 2018-06-20 DIAGNOSIS — J3089 Other allergic rhinitis: Secondary | ICD-10-CM | POA: Diagnosis not present

## 2018-06-24 DIAGNOSIS — J301 Allergic rhinitis due to pollen: Secondary | ICD-10-CM | POA: Diagnosis not present

## 2018-06-24 DIAGNOSIS — J3089 Other allergic rhinitis: Secondary | ICD-10-CM | POA: Diagnosis not present

## 2018-06-27 DIAGNOSIS — J3089 Other allergic rhinitis: Secondary | ICD-10-CM | POA: Diagnosis not present

## 2018-06-27 DIAGNOSIS — J301 Allergic rhinitis due to pollen: Secondary | ICD-10-CM | POA: Diagnosis not present

## 2018-06-30 NOTE — Progress Notes (Signed)
[name] [address] [place], [place] [address] Phone: [phone] Subjective:     CC: Back pain follow-up  [id number]  Richard Ellis is a 72 y.o. male coming in with complaint of neck pain.  Patient has had more of a lumbar radiculopathy.  Has responded fairly well to some L4-L5 epidurals.  Last one was 2 months ago.  Was able to run a race recently and did do very well.  Patient though states that after done running unfortunately that 3 days of pain afterwards is quite severe.  Some mild weakness.  More on the right side.     Past Medical History:  Diagnosis Date  . Allergy    rhinitis  . Anemia    "Hgb always on the low side" (07/02/2013)  . CAD (coronary artery disease)    a. LHC 2/17: pLAD 50, mLAD 95, pRCA 30, dRCA 25, EF 50-55% >> PCI:  3 x 18 mm Resolute DES to mLAD   . Chronic bronchitis ([place])    "not in the last year since I started taking allergy shots" (10/09/2016)  . Chronic lower back pain   . Chronic sinus bradycardia 07/02/2013  . Coronary artery disease 10/15/2016  . Cryptogenic stroke ([place]) 07/02/2013   a. "small ischemic occipital right sided" (07/02/2013)  //  b. Event monitor 10/14: sinus brady  //  c. Carotid US 10/14: bilat ICA 1-39%  . Esophageal stricture   . Exertional angina ([place]) 10/09/2016  . Heart murmur    "as a child" (07/02/2013)  . History of echocardiogram    a. Echo 10/14: EF 60-65%, no RWMA, normal diastolic function  . Hyperlipidemia   . Hypothyroidism   . Nonallopathic lesion of lumbosacral region 06/04/2017  . Nonallopathic lesion of sacral region 06/04/2017  . Nonallopathic lesion of thoracic region 06/04/2017  . Osteoarthritis    "lower back" (10/09/2016)  . PFO (patent foramen ovale)    a. TEE 11/14: mild LVH, EF 60-65%, small PFO, R-L shunt   Past Surgical History:  Procedure Laterality Date  . CARDIAC CATHETERIZATION N/A 11/10/2015   Procedure: Left Heart Cath and Coronary Angiography;  Surgeon:  [name], MD;  Location: [facility];  Service: Cardiovascular;  Laterality: N/A;  . CARDIAC CATHETERIZATION N/A 11/10/2015   Procedure: Coronary Stent Intervention;  Surgeon: [name], MD;  Location: [facility];  Service: Cardiovascular;  Laterality: N/A;  mid lad  3.0x18 resolute  . CARDIAC CATHETERIZATION  10/09/2016   "scheduled OHS for tomorrow" (10/09/2016)  . CARDIAC CATHETERIZATION N/A 10/09/2016   Procedure: Left Heart Cath and Coronary Angiography;  Surgeon: [name], MD;  Location: [facility];  Service: Cardiovascular;  Laterality: N/A;  . CARDIAC CATHETERIZATION N/A 10/09/2016   Procedure: Coronary Balloon Angioplasty;  Surgeon: [name], MD;  Location: [facility];  Service: Cardiovascular;  Laterality: N/A;  . CORONARY ARTERY BYPASS GRAFT N/A 10/15/2016   Procedure: CORONARY ARTERY BYPASS GRAFTING (CABG), ON PUMP, TIMES TWO, USING LEFT INTERNAL MAMMARY ARTERY AND RIGHT GREATER SAPHENOUS VEIN HARVESTED ENDOSCOPICALLY;  Surgeon: Ivin Poot, MD;  Location: [facility];  Service: Open Heart Surgery;  Laterality: N/A;  LIMA to LAD, SVG to DIAGONAL  . ESOPHAGOGASTRODUODENOSCOPY N/A 10/26/2012   Procedure: ESOPHAGOGASTRODUODENOSCOPY (EGD);  Surgeon: Winfield Cunas., MD;  Location: [facility]COPY;  Service: Endoscopy;  Laterality: N/A;  . ESOPHAGOGASTRODUODENOSCOPY N/A 07/08/2013   Procedure: ESOPHAGOGASTRODUODENOSCOPY (EGD);  Surgeon: Winfield Cunas., MD;  Location: [facility];  Service: Endoscopy;  Laterality: N/A;  . INNER EAR SURGERY Left 1957   "related to ear infection"  . REPAIR OF PATENT FORAMEN OVALE N/A 10/15/2016   Procedure: REPAIR OF PATENT FORAMEN OVALE;  Surgeon: Ivin Poot, MD;  Location: [facility];  Service: Open Heart Surgery;  Laterality: N/A;  . SAVORY DILATION N/A 07/08/2013   Procedure: SAVORY DILATION;  Surgeon: Winfield Cunas., MD;  Location: [facility];  Service: Endoscopy;  Laterality: N/A;  . TEE WITHOUT  CARDIOVERSION N/A 07/28/2013   Procedure: TRANSESOPHAGEAL ECHOCARDIOGRAM (TEE);  Surgeon: Pixie Casino, MD;  Location: [facility] ENDOSCOPY;  Service: Cardiovascular;  Laterality: N/A;  . TEE WITHOUT CARDIOVERSION N/A 10/15/2016   Procedure: TRANSESOPHAGEAL ECHOCARDIOGRAM (TEE);  Surgeon: Ivin Poot, MD;  Location: [facility];  Service: Open Heart Surgery;  Laterality: N/A;  . TONSILLECTOMY  1950's   Social History   Socioeconomic History  . Marital status: Married    Spouse name: Not on file  . Number of children: 1  . Years of education: Not on file  . Highest education level: Not on file  Occupational History  . Occupation: Retired  Scientific laboratory technician  . Financial resource strain: Not on file  . Food insecurity:    Worry: Not on file    Inability: Not on file  . Transportation needs:    Medical: Not on file    Non-medical: Not on file  Tobacco Use  . Smoking status: Never Smoker  . Smokeless tobacco: Never Used  Substance and Sexual Activity  . Alcohol use: Yes    Alcohol/week: 8.0 standard drinks    Types: 4 Glasses of wine, 4 Cans of beer per week    Comment: weekly  . Drug use: No  . Sexual activity: Yes  Lifestyle  . Physical activity:    Days per week: Not on file    Minutes per session: Not on file  . Stress: Not on file  Relationships  . Social connections:    Talks on phone: Not on file    Gets together: Not on file    Attends religious service: Not on file    Active member of club or organization: Not on file    Attends meetings of clubs or organizations: Not on file    Relationship status: Not on file  Other Topics Concern  . Not on file  Social History Narrative  . Not on file   Allergies  Allergen Reactions  . Atorvastatin Other (See Comments)    Muscle weakness  . Crestor [Rosuvastatin] Other (See Comments)    Muscle weakness  . Demerol [Meperidine] Nausea Only  . Niacin And Related Itching, Rash and Other (See Comments)    Palms turned red and itched    . Repatha [Evolocumab] Other (See Comments)    Muscle weakness   Family History  Problem Relation Age of Onset  . Coronary artery disease Other        male first degree relative <60  . Heart disease Father        heart attack  . Colon cancer Neg Hx     Current Outpatient Medications (Endocrine & Metabolic):  .  levothyroxine (SYNTHROID, LEVOTHROID) 25 MCG tablet, TAKE 1 TABLET BY MOUTH DAILY BEFORE BREAKFAST.    Current Outpatient Medications (Analgesics):  .  aspirin EC 81 MG tablet, Take 1 tablet (81 mg total) by mouth daily. [name]  ibuprofen (IBU) 400 MG tablet, Take 400 mg by mouth  every 6 (six) hours as needed.  Current Outpatient Medications (Hematological):  [name]  Ferrous Sulfate (IRON) 325 (65 Fe) MG TABS, Take 1 tablet by mouth daily.  Current Outpatient Medications (Other):  [name]  Cholecalciferol 2000 UNITS CAPS, Take 2,000 Units by mouth every evening.  .  gabapentin (NEURONTIN) 100 MG capsule, TAKE ONE CAPSULE BY MOUTH TWICE A DAY - .  Omega-3 Fatty Acids (FISH OIL) 1200 MG CAPS, Take 1,200 mg by mouth daily.  .  Turmeric 500 MG CAPS, Take 1 capsule by mouth at bedtime.    Past medical history, social, surgical and family history all reviewed in electronic medical record.  No pertanent information unless stated regarding to the chief complaint.   Review of Systems:  No headache, visual changes, nausea, vomiting, diarrhea, constipation, dizziness, abdominal pain, skin rash, fevers, chills, night sweats, weight loss, swollen lymph nodes, body aches, joint swelling, chest pain, shortness of breath, mood changes.  Positive muscle aches  Objective  Blood pressure 124/64, pulse (!) 54, resp. rate 16, weight 189 lb (85.7 kg), SpO2 98 %.    General: No apparent distress alert and oriented x3 mood and affect normal, dressed appropriately.  HEENT: Pupils equal, extraocular movements intact  Respiratory: Patient's speak in full sentences and does not appear short of breath   Cardiovascular: No lower extremity edema, non tender, no erythema  Skin: Warm dry intact with no signs of infection or rash on extremities or on axial skeleton.  Abdomen: Soft nontender  Neuro: Cranial nerves II through XII are intact, neurovascularly intact in all extremities with 2+ DTRs and 2+ pulses.  Lymph: No lymphadenopathy of posterior or anterior cervical chain or axillae bilaterally.  Gait normal with good balance and coordination.  MSK:  Non tender with full range of motion and good stability and symmetric strength and tone of shoulders, elbows, wrist, hip, knee and ankles bilaterally.  Back Exam:  Inspection: Loss of lordosis Motion: Flexion 40 deg, Extension 25 deg, Side Bending to 35 deg bilaterally,  Rotation to 30 deg bilaterally  SLR laying:  XSLR laying: Negative  Palpable tenderness: Tender to palpation  FABER: Positive. Sensory change: Gross sensation intact to all lumbar and sacral dermatomes.  Reflexes: 2+ at both patellar tendons, 2+ at achilles tendons, Babinski's downgoing.  Strength at foot  Plantar-flexion on the right and dorsiflexion on the right is 4 out of 5 compared to the contralateral side.  Still some mild hip abductor weakness on the left side      Impression and Recommendations:     This case required medical decision making of moderate complexity. The above documentation has been reviewed and is accurate and complete Lyndal Pulley, DO       Note: This dictation was prepared with Dragon dictation along with smaller phrase technology. Any transcriptional errors that result from this process are unintentional.

## 2018-07-01 ENCOUNTER — Encounter: Payer: Self-pay | Admitting: Family Medicine

## 2018-07-01 ENCOUNTER — Ambulatory Visit (INDEPENDENT_AMBULATORY_CARE_PROVIDER_SITE_OTHER): Payer: Medicare Other | Admitting: Family Medicine

## 2018-07-01 VITALS — BP 124/64 | HR 54 | Resp 16 | Wt 189.0 lb

## 2018-07-01 DIAGNOSIS — M5416 Radiculopathy, lumbar region: Secondary | ICD-10-CM | POA: Diagnosis not present

## 2018-07-01 DIAGNOSIS — I2510 Atherosclerotic heart disease of native coronary artery without angina pectoris: Secondary | ICD-10-CM

## 2018-07-01 NOTE — Patient Instructions (Signed)
God to see you  Dr. Maryjean Ka to discuss other injections. You are doing well overall  Ice is your friend Stay active  See me again 4-6 weeks

## 2018-07-01 NOTE — Assessment & Plan Note (Signed)
Patient has had difficulty with an L4 radiculopathy previously.  Still has some pain but unfortunately also has an S1 nerve root impingement on the right side.  Seen on previous MRI.  Discussed with patient about the epidurals that he has been responding fairly well.  Discussed with patient that I would like to refer him back to Dr. Maryjean Ka to see if radiofrequency ablation or other types of injections could be beneficial.  Patient continues to be significantly active and is continuing to run fairly well.  Encourage patient to continue to be active though.  Follow-up again in 4 to 8 weeks

## 2018-07-04 DIAGNOSIS — J301 Allergic rhinitis due to pollen: Secondary | ICD-10-CM | POA: Diagnosis not present

## 2018-07-04 DIAGNOSIS — J3089 Other allergic rhinitis: Secondary | ICD-10-CM | POA: Diagnosis not present

## 2018-07-09 DIAGNOSIS — J301 Allergic rhinitis due to pollen: Secondary | ICD-10-CM | POA: Diagnosis not present

## 2018-07-09 DIAGNOSIS — J3089 Other allergic rhinitis: Secondary | ICD-10-CM | POA: Diagnosis not present

## 2018-07-19 ENCOUNTER — Other Ambulatory Visit: Payer: Self-pay | Admitting: Internal Medicine

## 2018-07-23 ENCOUNTER — Telehealth: Payer: Self-pay | Admitting: *Deleted

## 2018-07-23 DIAGNOSIS — J3089 Other allergic rhinitis: Secondary | ICD-10-CM | POA: Diagnosis not present

## 2018-07-23 DIAGNOSIS — J301 Allergic rhinitis due to pollen: Secondary | ICD-10-CM | POA: Diagnosis not present

## 2018-07-23 DIAGNOSIS — M5416 Radiculopathy, lumbar region: Secondary | ICD-10-CM

## 2018-07-23 NOTE — Telephone Encounter (Signed)
Copied from [facility] [id number]. Topic: Referral - Request for Referral >> Jul 22, 2018 12:53 PM Reyne Dumas L wrote: Has patient seen PCP for this complaint? Yes - requesting referral from Dr. Tamala Julian *If NO, is insurance requiring patient see PCP for this issue before PCP can refer them? Referral for which specialty: physical therapy Preferred provider/office: Barbara[facility] Barthel Reason for referral: back pain, with an order he can do a dry needling technique  Pt can be reached at [phone]

## 2018-07-23 NOTE — Telephone Encounter (Signed)
Referral/order faxed to [facility].

## 2018-07-24 DIAGNOSIS — M47816 Spondylosis without myelopathy or radiculopathy, lumbar region: Secondary | ICD-10-CM | POA: Diagnosis not present

## 2018-07-30 NOTE — Progress Notes (Signed)
[facility] [address] [place], [place] [address] Phone: [phone] Subjective:   [name], am serving as a scribe for Dr. Hulan Saas.   CC: Back pain follow-up  [id number]  Richard [facility] is a 72 y.o. male coming in with complaint of back pain. He did have an increase in pain last week due to collecting leaves.   Is using 400mg  of IBU daily and pennsaid for pain relief.   Has been swimming and this week his pain is less than last week. Did try running 3 laps yesterday but was in too much pain.   Did speak with Dr. Maryjean Ka. PA thought that arthritis was the problem as it is pinching on the nerve. Is going to have a nerve block in December. Is also having dry needling performed by Barbaraann Barthel tomorrow.   Patient is in the process of being worked up for the potential for radiofrequency ablation.  Overall feels he is stable.  Still with working out on a regular basis but just not running as much as he would like     Past Medical History:  Diagnosis Date  . Allergy    rhinitis  . Anemia    "Hgb always on the low side" (07/02/2013)  . CAD (coronary artery disease)    a. LHC 2/17: pLAD 50, mLAD 95, pRCA 30, dRCA 25, EF 50-55% >> PCI:  3 x 18 mm Resolute DES to mLAD   . Chronic bronchitis ([place])    "not in the last year since I started taking allergy shots" (10/09/2016)  . Chronic lower back pain   . Chronic sinus bradycardia 07/02/2013  . Coronary artery disease 10/15/2016  . Cryptogenic stroke ([place]) 07/02/2013   a. "small ischemic occipital right sided" (07/02/2013)  //  b. Event monitor 10/14: sinus brady  //  c. Carotid US 10/14: bilat ICA 1-39%  . Esophageal stricture   . Exertional angina ([place]) 10/09/2016  . Heart murmur    "as a child" (07/02/2013)  . History of echocardiogram    a. Echo 10/14: EF 60-65%, no RWMA, normal diastolic function  . Hyperlipidemia   . Hypothyroidism   . Nonallopathic lesion of lumbosacral region  06/04/2017  . Nonallopathic lesion of sacral region 06/04/2017  . Nonallopathic lesion of thoracic region 06/04/2017  . Osteoarthritis    "lower back" (10/09/2016)  . PFO (patent foramen ovale)    a. TEE 11/14: mild LVH, EF 60-65%, small PFO, R-L shunt   Past Surgical History:  Procedure Laterality Date  . CARDIAC CATHETERIZATION N/A 11/10/2015   Procedure: Left Heart Cath and Coronary Angiography;  Surgeon: [name], MD;  Location: [facility];  Service: Cardiovascular;  Laterality: N/A;  . CARDIAC CATHETERIZATION N/A 11/10/2015   Procedure: Coronary Stent Intervention;  Surgeon: [name], MD;  Location: [facility];  Service: Cardiovascular;  Laterality: N/A;  mid lad  3.0x18 resolute  . CARDIAC CATHETERIZATION  10/09/2016   "scheduled OHS for tomorrow" (10/09/2016)  . CARDIAC CATHETERIZATION N/A 10/09/2016   Procedure: Left Heart Cath and Coronary Angiography;  Surgeon: [name], MD;  Location: [facility];  Service: Cardiovascular;  Laterality: N/A;  . CARDIAC CATHETERIZATION N/A 10/09/2016   Procedure: Coronary Balloon Angioplasty;  Surgeon: [name], MD;  Location: [facility];  Service: Cardiovascular;  Laterality: N/A;  . CORONARY ARTERY BYPASS GRAFT N/A 10/15/2016   Procedure: CORONARY ARTERY BYPASS GRAFTING (CABG), ON PUMP, TIMES TWO, USING  LEFT INTERNAL MAMMARY ARTERY AND RIGHT GREATER SAPHENOUS VEIN HARVESTED ENDOSCOPICALLY;  Surgeon: Ivin Poot, MD;  Location: [facility];  Service: Open Heart Surgery;  Laterality: N/A;  LIMA to LAD, SVG to DIAGONAL  . ESOPHAGOGASTRODUODENOSCOPY N/A 10/26/2012   Procedure: ESOPHAGOGASTRODUODENOSCOPY (EGD);  Surgeon: Winfield Cunas., MD;  Location[facility];  Service: Endoscopy;  Laterality: N/A;  . ESOPHAGOGASTRODUODENOSCOPY N/A 07/08/2013   Procedure: ESOPHAGOGASTRODUODENOSCOPY (EGD);  Surgeon: Winfield Cunas., MD;  Location: [facility];  Service: Endoscopy;  Laterality: N/A;  . INNER EAR  SURGERY Left 1957   "related to ear infection"  . REPAIR OF PATENT FORAMEN OVALE N/A 10/15/2016   Procedure: REPAIR OF PATENT FORAMEN OVALE;  Surgeon: Ivin Poot, MD;  Location: [facility];  Service: Open Heart Surgery;  Laterality: N/A;  . SAVORY DILATION N/A 07/08/2013   Procedure: SAVORY DILATION;  Surgeon: Winfield Cunas., MD;  Location: [facility];  Service: Endoscopy;  Laterality: N/A;  . TEE WITHOUT CARDIOVERSION N/A 07/28/2013   Procedure: TRANSESOPHAGEAL ECHOCARDIOGRAM (TEE);  Surgeon: Pixie Casino, MD;  Location: [facility];  Service: Cardiovascular;  Laterality: N/A;  . TEE WITHOUT CARDIOVERSION N/A 10/15/2016   Procedure: TRANSESOPHAGEAL ECHOCARDIOGRAM (TEE);  Surgeon: Ivin Poot, MD;  Location: [facility];  Service: Open Heart Surgery;  Laterality: N/A;  . TONSILLECTOMY  1950's   Social History   Socioeconomic History  . Marital status: Married    Spouse name: Not on file  . Number of children: 1  . Years of education: Not on file  . Highest education level: Not on file  Occupational History  . Occupation: Retired  Scientific laboratory technician  . Financial resource strain: Not on file  . Food insecurity:    Worry: Not on file    Inability: Not on file  . Transportation needs:    Medical: Not on file    Non-medical: Not on file  Tobacco Use  . Smoking status: Never Smoker  . Smokeless tobacco: Never Used  Substance and Sexual Activity  . Alcohol use: Yes    Alcohol/week: 8.0 standard drinks    Types: 4 Glasses of wine, 4 Cans of beer per week    Comment: weekly  . Drug use: No  . Sexual activity: Yes  Lifestyle  . Physical activity:    Days per week: Not on file    Minutes per session: Not on file  . Stress: Not on file  Relationships  . Social connections:    Talks on phone: Not on file    Gets together: Not on file    Attends religious service: Not on file    Active member of club or organization: Not on file    Attends meetings of clubs or organizations:  Not on file    Relationship status: Not on file  Other Topics Concern  . Not on file  Social History Narrative  . Not on file   Allergies  Allergen Reactions  . Atorvastatin Other (See Comments)    Muscle weakness  . Crestor [Rosuvastatin] Other (See Comments)    Muscle weakness  . Demerol [Meperidine] Nausea Only  . Niacin And Related Itching, Rash and Other (See Comments)    Palms turned red and itched  . Repatha [Evolocumab] Other (See Comments)    Muscle weakness   Family History  Problem Relation Age of Onset  . Coronary artery disease Other        male first degree relative <60  . Heart  disease Father        heart attack  . Colon cancer Neg Hx     Current Outpatient Medications (Endocrine & Metabolic):  .  levothyroxine (SYNTHROID, LEVOTHROID) 25 MCG tablet, TAKE 1 TABLET BY MOUTH DAILY BEFORE BREAKFAST. [name]  levothyroxine (SYNTHROID, LEVOTHROID) 25 MCG tablet, TAKE 1 TABLET BY MOUTH EVERY DAY BEFORE BREAKFAST    Current Outpatient Medications (Analgesics):  .  aspirin EC 81 MG tablet, Take 1 tablet (81 mg total) by mouth daily. [name]  ibuprofen (IBU) 400 MG tablet, Take 400 mg by mouth every 6 (six) hours as needed.  Current Outpatient Medications (Hematological):  [name]  Ferrous Sulfate (IRON) 325 (65 Fe) MG TABS, Take 1 tablet by mouth daily.  Current Outpatient Medications (Other):  [name]  Cholecalciferol 2000 UNITS CAPS, Take 2,000 Units by mouth every evening.  .  gabapentin (NEURONTIN) 100 MG capsule, TAKE ONE CAPSULE BY MOUTH TWICE A DAY - .  Omega-3 Fatty Acids (FISH OIL) 1200 MG CAPS, Take 1,200 mg by mouth daily.  .  Turmeric 500 MG CAPS, Take 1 capsule by mouth at bedtime.    Past medical history, social, surgical and family history all reviewed in electronic medical record.  No pertanent information unless stated regarding to the chief complaint.   Review of Systems:  No headache, visual changes, nausea, vomiting, diarrhea, constipation, dizziness, abdominal  pain, skin rash, fevers, chills, night sweats, weight loss, swollen lymph nodes, body aches, joint swelling, , chest pain, shortness of breath, mood changes.  Positive muscle aches  Objective  Blood pressure 110/72, pulse (!) 52, height 6\' 1"  (1.854 m), weight 189 lb (85.7 kg), SpO2 98 %.    General: No apparent distress alert and oriented x3 mood and affect normal, dressed appropriately.  HEENT: Pupils equal, extraocular movements intact  Respiratory: Patient's speak in full sentences and does not appear short of breath  Cardiovascular: No lower extremity edema, non tender, no erythema  Skin: Warm dry intact with no signs of infection or rash on extremities or on axial skeleton.  Abdomen: Soft nontender  Neuro: Cranial nerves II through XII are intact, neurovascularly intact in all extremities with 2+ DTRs and 2+ pulses.  Lymph: No lymphadenopathy of posterior or anterior cervical chain or axillae bilaterally.  Gait normal with good balance and coordination.  MSK:  Non tender with full range of motion and good stability and symmetric strength and tone of shoulders, elbows, wrist, hip, knee and ankles bilaterally.  Very mild arthritic changes of multiple joints  Continue some mild pain over the gluteal areas bilaterally.  Some more pain with the straight leg test bilaterally but no radicular symptoms.  Positive Faber on the left side.  More pain with the back with extension but near full range of motion.    Impression and Recommendations:     This case required medical decision making of moderate complexity. The above documentation has been reviewed and is accurate and complete Lyndal Pulley, DO       Note: This dictation was prepared with Dragon dictation along with smaller phrase technology. Any transcriptional errors that result from this process are unintentional.

## 2018-07-31 ENCOUNTER — Ambulatory Visit (INDEPENDENT_AMBULATORY_CARE_PROVIDER_SITE_OTHER): Payer: Medicare Other | Admitting: Family Medicine

## 2018-07-31 ENCOUNTER — Encounter: Payer: Self-pay | Admitting: Family Medicine

## 2018-07-31 DIAGNOSIS — I2510 Atherosclerotic heart disease of native coronary artery without angina pectoris: Secondary | ICD-10-CM

## 2018-07-31 DIAGNOSIS — M5416 Radiculopathy, lumbar region: Secondary | ICD-10-CM | POA: Diagnosis not present

## 2018-07-31 NOTE — Assessment & Plan Note (Signed)
Discussed with patient in great length.  Undergoing a nerve root injection from the outside facility that I think is a good idea for diagnostic as well as possible therapeutic.  Discussed icing regimen and home exercises.  Discussed which activities to do.  Patient can follow-up with me in 3 months depending on how patient responds to the other treatments.

## 2018-07-31 NOTE — Patient Instructions (Signed)
Good to see you  I like the idea of the nerve root injection and then see what they say  Give me feedback on the dry needling.  Also then after the nerve injection  Have an appointment set up in 2 months just in case.

## 2018-08-01 DIAGNOSIS — M545 Low back pain: Secondary | ICD-10-CM | POA: Diagnosis not present

## 2018-08-01 DIAGNOSIS — M5417 Radiculopathy, lumbosacral region: Secondary | ICD-10-CM | POA: Diagnosis not present

## 2018-08-01 DIAGNOSIS — M5127 Other intervertebral disc displacement, lumbosacral region: Secondary | ICD-10-CM | POA: Diagnosis not present

## 2018-08-06 DIAGNOSIS — J3089 Other allergic rhinitis: Secondary | ICD-10-CM | POA: Diagnosis not present

## 2018-08-06 DIAGNOSIS — J45991 Cough variant asthma: Secondary | ICD-10-CM | POA: Diagnosis not present

## 2018-08-06 DIAGNOSIS — J301 Allergic rhinitis due to pollen: Secondary | ICD-10-CM | POA: Diagnosis not present

## 2018-08-08 ENCOUNTER — Encounter: Payer: Self-pay | Admitting: Family Medicine

## 2018-08-20 ENCOUNTER — Other Ambulatory Visit: Payer: Self-pay | Admitting: Family Medicine

## 2018-09-04 DIAGNOSIS — M47816 Spondylosis without myelopathy or radiculopathy, lumbar region: Secondary | ICD-10-CM | POA: Diagnosis not present

## 2018-09-23 NOTE — Progress Notes (Signed)
[facility] [address] [place], [place] [address] Phone: [phone] Subjective:   [name], am serving as a scribe for Dr. Hulan Saas.   CC: Back pain follow-up  LEX:[id number]  Richard [facility] is a 73 y.o. male coming in with complaint of back pain. He said that he has been pain free for a few weeks after having a nerve root injection by Dr. Maryjean Ka. Does continue to take 100mg  gabapentin daily. Is planning on cutting back if advised. Has been running without pain. Does have tightness post-run.  Patient feels that the tightness that is more muscle soreness than truly anything else at this time.      Past Medical History:  Diagnosis Date  . Allergy    rhinitis  . Anemia    "Hgb always on the low side" (07/02/2013)  . CAD (coronary artery disease)    a. LHC 2/17: pLAD 50, mLAD 95, pRCA 30, dRCA 25, EF 50-55% >> PCI:  3 x 18 mm Resolute DES to mLAD   . Chronic bronchitis ([place])    "not in the last year since I started taking allergy shots" (10/09/2016)  . Chronic lower back pain   . Chronic sinus bradycardia 07/02/2013  . Coronary artery disease 10/15/2016  . Cryptogenic stroke ([place]) 07/02/2013   a. "small ischemic occipital right sided" (07/02/2013)  //  b. Event monitor 10/14: sinus brady  //  c. Carotid US 10/14: bilat ICA 1-39%  . Esophageal stricture   . Exertional angina ([place]) 10/09/2016  . Heart murmur    "as a child" (07/02/2013)  . History of echocardiogram    a. Echo 10/14: EF 60-65%, no RWMA, normal diastolic function  . Hyperlipidemia   . Hypothyroidism   . Nonallopathic lesion of lumbosacral region 06/04/2017  . Nonallopathic lesion of sacral region 06/04/2017  . Nonallopathic lesion of thoracic region 06/04/2017  . Osteoarthritis    "lower back" (10/09/2016)  . PFO (patent foramen ovale)    a. TEE 11/14: mild LVH, EF 60-65%, small PFO, R-L shunt   Past Surgical History:  Procedure Laterality Date  . CARDIAC CATHETERIZATION  N/A 11/10/2015   Procedure: Left Heart Cath and Coronary Angiography;  Surgeon: [name], MD;  Location: [facility];  Service: Cardiovascular;  Laterality: N/A;  . CARDIAC CATHETERIZATION N/A 11/10/2015   Procedure: Coronary Stent Intervention;  Surgeon: [name], MD;  Location: [facility];  Service: Cardiovascular;  Laterality: N/A;  mid lad  3.0x18 resolute  . CARDIAC CATHETERIZATION  10/09/2016   "scheduled OHS for tomorrow" (10/09/2016)  . CARDIAC CATHETERIZATION N/A 10/09/2016   Procedure: Left Heart Cath and Coronary Angiography;  Surgeon: [name], MD;  Location: [facility];  Service: Cardiovascular;  Laterality: N/A;  . CARDIAC CATHETERIZATION N/A 10/09/2016   Procedure: Coronary Balloon Angioplasty;  Surgeon: [name], MD;  Location: [facility];  Service: Cardiovascular;  Laterality: N/A;  . CORONARY ARTERY BYPASS GRAFT N/A 10/15/2016   Procedure: CORONARY ARTERY BYPASS GRAFTING (CABG), ON PUMP, TIMES TWO, USING LEFT INTERNAL MAMMARY ARTERY AND RIGHT GREATER SAPHENOUS VEIN HARVESTED ENDOSCOPICALLY;  Surgeon: Ivin Poot, MD;  Location: [facility];  Service: Open Heart Surgery;  Laterality: N/A;  LIMA to LAD, SVG to DIAGONAL  . ESOPHAGOGASTRODUODENOSCOPY N/A 10/26/2012   Procedure: ESOPHAGOGASTRODUODENOSCOPY (EGD);  Surgeon: Winfield Cunas., MD;  Location: [facility]COPY;  Service: Endoscopy;  Laterality: N/A;  . ESOPHAGOGASTRODUODENOSCOPY N/A 07/08/2013   Procedure: ESOPHAGOGASTRODUODENOSCOPY (  EGD);  Surgeon: Winfield Cunas., MD;  Location: [facility];  Service: Endoscopy;  Laterality: N/A;  . INNER EAR SURGERY Left 1957   "related to ear infection"  . REPAIR OF PATENT FORAMEN OVALE N/A 10/15/2016   Procedure: REPAIR OF PATENT FORAMEN OVALE;  Surgeon: Ivin Poot, MD;  Location: [facility];  Service: Open Heart Surgery;  Laterality: N/A;  . SAVORY DILATION N/A 07/08/2013   Procedure: SAVORY DILATION;  Surgeon: Winfield Cunas., MD;   Location: [facility];  Service: Endoscopy;  Laterality: N/A;  . TEE WITHOUT CARDIOVERSION N/A 07/28/2013   Procedure: TRANSESOPHAGEAL ECHOCARDIOGRAM (TEE);  Surgeon: Pixie Casino, MD;  Location: [facility] ENDOSCOPY;  Service: Cardiovascular;  Laterality: N/A;  . TEE WITHOUT CARDIOVERSION N/A 10/15/2016   Procedure: TRANSESOPHAGEAL ECHOCARDIOGRAM (TEE);  Surgeon: Ivin Poot, MD;  Location: [facility];  Service: Open Heart Surgery;  Laterality: N/A;  . TONSILLECTOMY  1950's   Social History   Socioeconomic History  . Marital status: Married    Spouse name: Not on file  . Number of children: 1  . Years of education: Not on file  . Highest education level: Not on file  Occupational History  . Occupation: Retired  Scientific laboratory technician  . Financial resource strain: Not on file  . Food insecurity:    Worry: Not on file    Inability: Not on file  . Transportation needs:    Medical: Not on file    Non-medical: Not on file  Tobacco Use  . Smoking status: Never Smoker  . Smokeless tobacco: Never Used  Substance and Sexual Activity  . Alcohol use: Yes    Alcohol/week: 8.0 standard drinks    Types: 4 Glasses of wine, 4 Cans of beer per week    Comment: weekly  . Drug use: No  . Sexual activity: Yes  Lifestyle  . Physical activity:    Days per week: Not on file    Minutes per session: Not on file  . Stress: Not on file  Relationships  . Social connections:    Talks on phone: Not on file    Gets together: Not on file    Attends religious service: Not on file    Active member of club or organization: Not on file    Attends meetings of clubs or organizations: Not on file    Relationship status: Not on file  Other Topics Concern  . Not on file  Social History Narrative  . Not on file   Allergies  Allergen Reactions  . Atorvastatin Other (See Comments)    Muscle weakness  . Crestor [Rosuvastatin] Other (See Comments)    Muscle weakness  . Demerol [Meperidine] Nausea Only  . Niacin And  Related Itching, Rash and Other (See Comments)    Palms turned red and itched  . Repatha [Evolocumab] Other (See Comments)    Muscle weakness   Family History  Problem Relation Age of Onset  . Coronary artery disease Other        male first degree relative <60  . Heart disease Father        heart attack  . Colon cancer Neg Hx     Current Outpatient Medications (Endocrine & Metabolic):  .  levothyroxine (SYNTHROID, LEVOTHROID) 25 MCG tablet, TAKE 1 TABLET BY MOUTH DAILY BEFORE BREAKFAST. [name]  levothyroxine (SYNTHROID, LEVOTHROID) 25 MCG tablet, TAKE 1 TABLET BY MOUTH EVERY DAY BEFORE BREAKFAST    Current Outpatient Medications (Analgesics):  .  aspirin  EC 81 MG tablet, Take 1 tablet (81 mg total) by mouth daily. [name]  ibuprofen (IBU) 400 MG tablet, Take 400 mg by mouth every 6 (six) hours as needed.  Current Outpatient Medications (Hematological):  [name]  Ferrous Sulfate (IRON) 325 (65 Fe) MG TABS, Take 1 tablet by mouth daily.  Current Outpatient Medications (Other):  [name]  Cholecalciferol 2000 UNITS CAPS, Take 2,000 Units by mouth every evening.  .  gabapentin (NEURONTIN) 100 MG capsule, TAKE ONE CAPSULE BY MOUTH TWICE A DAY .  Omega-3 Fatty Acids (FISH OIL) 1200 MG CAPS, Take 1,200 mg by mouth daily.  .  Turmeric 500 MG CAPS, Take 1 capsule by mouth at bedtime.    Past medical history, social, surgical and family history all reviewed in electronic medical record.  No pertanent information unless stated regarding to the chief complaint.   Review of Systems:  No headache, visual changes, nausea, vomiting, diarrhea, constipation, dizziness, abdominal pain, skin rash, fevers, chills, night sweats, weight loss, swollen lymph nodes, body aches, joint swelling, mchest pain, shortness of breath, mood changes.  Positive muscle aches  Objective  Blood pressure 108/66, pulse (!) 52, height 6\' 1"  (1.854 m), weight 189 lb (85.7 kg), SpO2 98 %.  General: No apparent distress alert and oriented x3  mood and affect normal, dressed appropriately.  HEENT: Pupils equal, extraocular movements intact  Respiratory: Patient's speak in full sentences and does not appear short of breath  Cardiovascular: No lower extremity edema, non tender, no erythema  Skin: Warm dry intact with no signs of infection or rash on extremities or on axial skeleton.  Abdomen: Soft nontender  Neuro: Cranial nerves II through XII are intact, neurovascularly intact in all extremities with 2+ DTRs and 2+ pulses.  Lymph: No lymphadenopathy of posterior or anterior cervical chain or axillae bilaterally.  Gait normal with good balance and coordination.  MSK:  Non tender with full range of motion and good stability and symmetric strength and tone of shoulders, elbows, wrist, hip, knee and ankles bilaterally.  Back exam shows the patient does have some mild loss of lordosis.  Near complete range of motion though.  Patient has no discomfort with Corky Sox test which is new.  Negative straight leg test which is improved as well.   Impression and Recommendations:      The above documentation has been reviewed and is accurate and complete Lyndal Pulley, DO       Note: This dictation was prepared with Dragon dictation along with smaller phrase technology. Any transcriptional errors that result from this process are unintentional.

## 2018-09-24 ENCOUNTER — Ambulatory Visit (INDEPENDENT_AMBULATORY_CARE_PROVIDER_SITE_OTHER): Payer: Medicare Other | Admitting: Family Medicine

## 2018-09-24 ENCOUNTER — Encounter: Payer: Self-pay | Admitting: Family Medicine

## 2018-09-24 DIAGNOSIS — M5416 Radiculopathy, lumbar region: Secondary | ICD-10-CM

## 2018-09-24 NOTE — Patient Instructions (Signed)
Good to see you Can't wait to see you in all the races again  If stopping iron or gabapentin stop one or the other for a week then stop the other See me again in 2ish months Happy New Year!

## 2018-09-24 NOTE — Assessment & Plan Note (Signed)
Much improved at this time.  Responding well to the injections.  Discussed icing regimen and home exercises.  Discussed which activities to doing which was to avoid.  Patient is to increase activity slowly over the course the next several weeks.  Patient is going to follow-up with Dr. Maryjean Ka to discuss other injections.  Follow-up with me again in 8 weeks

## 2018-09-29 ENCOUNTER — Encounter: Payer: Self-pay | Admitting: Family Medicine

## 2018-11-21 DIAGNOSIS — M5417 Radiculopathy, lumbosacral region: Secondary | ICD-10-CM | POA: Diagnosis not present

## 2018-11-21 DIAGNOSIS — M5127 Other intervertebral disc displacement, lumbosacral region: Secondary | ICD-10-CM | POA: Diagnosis not present

## 2018-11-21 DIAGNOSIS — M545 Low back pain: Secondary | ICD-10-CM | POA: Diagnosis not present

## 2018-12-02 ENCOUNTER — Other Ambulatory Visit: Payer: Medicare Other

## 2018-12-02 ENCOUNTER — Other Ambulatory Visit (INDEPENDENT_AMBULATORY_CARE_PROVIDER_SITE_OTHER): Payer: Medicare Other

## 2018-12-02 ENCOUNTER — Encounter: Payer: Self-pay | Admitting: Internal Medicine

## 2018-12-02 ENCOUNTER — Ambulatory Visit (INDEPENDENT_AMBULATORY_CARE_PROVIDER_SITE_OTHER): Payer: Medicare Other | Admitting: Internal Medicine

## 2018-12-02 ENCOUNTER — Ambulatory Visit (INDEPENDENT_AMBULATORY_CARE_PROVIDER_SITE_OTHER)
Admission: RE | Admit: 2018-12-02 | Discharge: 2018-12-02 | Disposition: A | Discharge status: Home / Self Care | Payer: Medicare Other | Source: Ambulatory Visit | Attending: Internal Medicine | Admitting: Internal Medicine

## 2018-12-02 ENCOUNTER — Other Ambulatory Visit: Payer: Self-pay

## 2018-12-02 VITALS — BP 132/86 | HR 46 | Temp 97.7°F | Ht 73.0 in | Wt 187.0 lb

## 2018-12-02 DIAGNOSIS — E782 Mixed hyperlipidemia: Secondary | ICD-10-CM

## 2018-12-02 DIAGNOSIS — R5383 Other fatigue: Secondary | ICD-10-CM | POA: Diagnosis not present

## 2018-12-02 DIAGNOSIS — I25810 Atherosclerosis of coronary artery bypass graft(s) without angina pectoris: Secondary | ICD-10-CM

## 2018-12-02 DIAGNOSIS — I25118 Atherosclerotic heart disease of native coronary artery with other forms of angina pectoris: Secondary | ICD-10-CM

## 2018-12-02 DIAGNOSIS — M791 Myalgia, unspecified site: Secondary | ICD-10-CM

## 2018-12-02 DIAGNOSIS — R202 Paresthesia of skin: Secondary | ICD-10-CM | POA: Diagnosis not present

## 2018-12-02 DIAGNOSIS — D500 Iron deficiency anemia secondary to blood loss (chronic): Secondary | ICD-10-CM | POA: Diagnosis not present

## 2018-12-02 LAB — CBC WITH DIFFERENTIAL/PLATELET
Basophils Absolute: 0 10*3/uL (ref 0.0–0.1)
Basophils Relative: 0.8 % (ref 0.0–3.0)
Eosinophils Absolute: 0.2 10*3/uL (ref 0.0–0.7)
Eosinophils Relative: 3.9 % (ref 0.0–5.0)
HCT: 41.2 % (ref 39.0–52.0)
Hemoglobin: 14.1 g/dL (ref 13.0–17.0)
Lymphocytes Relative: 27.4 % (ref 12.0–46.0)
Lymphs Abs: 1.5 10*3/uL (ref 0.7–4.0)
MCHC: 34.3 g/dL (ref 30.0–36.0)
MCV: 94.4 fl (ref 78.0–100.0)
Monocytes Absolute: 0.6 10*3/uL (ref 0.1–1.0)
Monocytes Relative: 11.3 % (ref 3.0–12.0)
Neutro Abs: 3 10*3/uL (ref 1.4–7.7)
Neutrophils Relative %: 56.6 % (ref 43.0–77.0)
Platelets: 209 10*3/uL (ref 150.0–400.0)
RBC: 4.37 Mil/uL (ref 4.22–5.81)
RDW: 13.2 % (ref 11.5–15.5)
WBC: 5.3 10*3/uL (ref 4.0–10.5)

## 2018-12-02 LAB — SEDIMENTATION RATE: Sed Rate: 14 mm/hr (ref 0–20)

## 2018-12-02 LAB — URINALYSIS
Bilirubin Urine: NEGATIVE
Hgb urine dipstick: NEGATIVE
Ketones, ur: NEGATIVE
Leukocytes,Ua: NEGATIVE
Nitrite: NEGATIVE
Specific Gravity, Urine: 1.015 (ref 1.000–1.030)
Total Protein, Urine: NEGATIVE
Urine Glucose: NEGATIVE
Urobilinogen, UA: 0.2 (ref 0.0–1.0)
pH: 6 (ref 5.0–8.0)

## 2018-12-02 LAB — HEPATIC FUNCTION PANEL
ALT: 16 U/L (ref 0–53)
AST: 19 U/L (ref 0–37)
Albumin: 4.5 g/dL (ref 3.5–5.2)
Alkaline Phosphatase: 50 U/L (ref 39–117)
Bilirubin, Direct: 0.1 mg/dL (ref 0.0–0.3)
Total Bilirubin: 0.8 mg/dL (ref 0.2–1.2)
Total Protein: 7.1 g/dL (ref 6.0–8.3)

## 2018-12-02 LAB — BASIC METABOLIC PANEL
BUN: 19 mg/dL (ref 6–23)
CO2: 29 mEq/L (ref 19–32)
Calcium: 9.7 mg/dL (ref 8.4–10.5)
Chloride: 103 mEq/L (ref 96–112)
Creatinine, Ser: 1.05 mg/dL (ref 0.40–1.50)
GFR: 69.3 mL/min (ref >60.00)
Glucose, Bld: 100 mg/dL — ABNORMAL HIGH (ref 70–99)
Potassium: 4.1 mEq/L (ref 3.5–5.1)
Sodium: 138 mEq/L (ref 135–145)

## 2018-12-02 LAB — TSH: TSH: 3.37 u[IU]/mL (ref 0.35–4.50)

## 2018-12-02 LAB — CORTISOL: Cortisol, Plasma: 7.6 ug/dL

## 2018-12-02 LAB — TESTOSTERONE: Testosterone: 495.01 ng/dL (ref 300.00–890.00)

## 2018-12-02 LAB — VITAMIN B12: Vitamin B-12: 440 pg/mL (ref 211–911)

## 2018-12-02 LAB — CK: Total CK: 103 U/L (ref 7–232)

## 2018-12-02 NOTE — Assessment & Plan Note (Signed)
X 4 weeks ?etiology. 20% better Labs, CXR

## 2018-12-02 NOTE — Assessment & Plan Note (Signed)
Statin, Repatha intolerant

## 2018-12-02 NOTE — Progress Notes (Signed)
Subjective:  Patient ID: Richard Ellis, male    DOB: [birth year]  Age: 73 y.o. MRN: [id number]  CC: No chief complaint on file.   HPI Richard Ellis presents for fatigue x 3-4 weeks. It happened in 2-3 days  - - 20% better C/o some HAs, LBP, achy. No CP, cough, ST.  Outpatient Medications Prior to Visit  Medication Sig Dispense Refill  . aspirin EC 81 MG tablet Take 1 tablet (81 mg total) by mouth daily. 90 tablet 3  . Cholecalciferol 2000 UNITS CAPS Take 2,000 Units by mouth every evening.     . Ferrous Sulfate (IRON) 325 (65 Fe) MG TABS Take 1 tablet by mouth daily.    [name] gabapentin (NEURONTIN) 100 MG capsule TAKE ONE CAPSULE BY MOUTH TWICE A DAY 180 capsule 1  . levothyroxine (SYNTHROID, LEVOTHROID) 25 MCG tablet TAKE 1 TABLET BY MOUTH DAILY BEFORE BREAKFAST. 90 tablet 3  . Omega-3 Fatty Acids (FISH OIL) 1200 MG CAPS Take 1,200 mg by mouth daily.     . Turmeric 500 MG CAPS Take 1 capsule by mouth at bedtime.    [name] ibuprofen (IBU) 400 MG tablet Take 400 mg by mouth every 6 (six) hours as needed.    [name] levothyroxine (SYNTHROID, LEVOTHROID) 25 MCG tablet TAKE 1 TABLET BY MOUTH EVERY DAY BEFORE BREAKFAST 90 tablet 3   No facility-administered medications prior to visit.     ROS: Review of Systems  Constitutional: Positive for fatigue. Negative for appetite change and unexpected weight change.  HENT: Negative for congestion, nosebleeds, sneezing, sore throat and trouble swallowing.   Eyes: Negative for itching and visual disturbance.  Respiratory: Negative for cough.   Cardiovascular: Negative for chest pain, palpitations and leg swelling.  Gastrointestinal: Negative for abdominal distention, blood in stool, diarrhea and nausea.  Genitourinary: Negative for frequency and hematuria.  Musculoskeletal: Negative for back pain, gait problem, joint swelling and neck pain.  Skin: Negative for rash.  Neurological: Positive for weakness. Negative for dizziness, tremors and speech difficulty.   Psychiatric/Behavioral: Negative for agitation, dysphoric mood, sleep disturbance and suicidal ideas. The patient is not nervous/anxious.     Objective:  BP 132/86 (BP Location: Left Arm, Patient Position: Sitting, Cuff Size: Normal)   Pulse (!) 46   Temp 97.7 F (36.5 C) (Oral)   Ht 6\' 1"  (1.854 m)   Wt 187 lb (84.8 kg)   SpO2 98%   BMI 24.67 kg/m   BP Readings from Last 3 Encounters:  12/02/18 132/86  09/24/18 108/66  07/31/18 110/72    Wt Readings from Last 3 Encounters:  12/02/18 187 lb (84.8 kg)  09/24/18 189 lb (85.7 kg)  07/31/18 189 lb (85.7 kg)    Physical Exam Constitutional:      General: He is not in acute distress.    Appearance: He is well-developed.     Comments: NAD  Eyes:     Conjunctiva/sclera: Conjunctivae normal.     Pupils: Pupils are equal, round, and reactive to light.  Neck:     Musculoskeletal: Normal range of motion.     Thyroid: No thyromegaly.     Vascular: No JVD.  Cardiovascular:     Rate and Rhythm: Normal rate and regular rhythm.     Heart sounds: Normal heart sounds. No murmur. No friction rub. No gallop.   Pulmonary:     Effort: Pulmonary effort is normal. No respiratory distress.     Breath sounds: Normal breath sounds. No wheezing or rales.  Chest:     Chest wall: No tenderness.  Abdominal:     General: Bowel sounds are normal. There is no distension.     Palpations: Abdomen is soft. There is no mass.     Tenderness: There is no abdominal tenderness. There is no guarding or rebound.  Musculoskeletal: Normal range of motion.        General: No tenderness.  Lymphadenopathy:     Cervical: No cervical adenopathy.  Skin:    General: Skin is warm and dry.     Findings: No rash.  Neurological:     Mental Status: He is alert and oriented to person, place, and time.     Cranial Nerves: No cranial nerve deficit.     Motor: No abnormal muscle tone.     Coordination: Coordination normal.     Gait: Gait normal.     Deep Tendon  Reflexes: Reflexes are normal and symmetric.  Psychiatric:        Behavior: Behavior normal.        Thought Content: Thought content normal.        Judgment: Judgment normal.     Lab Results  Component Value Date   WBC 4.6 05/09/2017   HGB 14.0 05/09/2017   HCT 41.8 05/09/2017   PLT 190.0 05/09/2017   GLUCOSE 103 (H) 10/19/2016   CHOL 122 (L) 04/18/2016   TRIG 115 04/18/2016   HDL 52 04/18/2016   LDLDIRECT 122.0 05/09/2015   LDLCALC 47 04/18/2016   ALT 20 10/11/2016   AST 27 10/11/2016   NA 135 10/19/2016   K 3.9 10/19/2016   CL 98 (L) 10/19/2016   CREATININE 0.84 10/19/2016   BUN 13 10/19/2016   CO2 26 10/19/2016   TSH 2.28 05/09/2017   PSA 4.83 (H) 04/19/2016   INR 1.33 10/15/2016   HGBA1C 5.6 10/11/2016    Dg Inject Diag/thera/inc Needle/cath/plc Epi/lumb/sac W/img  Result Date: 05/06/2018 CLINICAL DATA:  Spondylosis without myelopathy. Improvement from previous injections but with some persistent discomfort. Symptoms worse on the right than the left presently. FLUOROSCOPY TIME:  0 minutes 20 seconds. 21.44 micro gray meter squared PROCEDURE: The procedure, risks, benefits, and alternatives were explained to the patient. Questions regarding the procedure were encouraged and answered. The patient understands and consents to the procedure. LUMBAR EPIDURAL INJECTION: An interlaminar approach was performed on the right at L5-S1. The overlying skin was cleansed and anesthetized. A 20 gauge epidural needle was advanced using loss-of-resistance technique. DIAGNOSTIC EPIDURAL INJECTION: Injection of Isovue-M 200 shows a good epidural pattern with spread above and below the level of needle placement, primarily on the right. No vascular opacification is seen. THERAPEUTIC EPIDURAL INJECTION: One hundred twenty mg of Depo-Medrol mixed with 2.5 cc 1% lidocaine were instilled. The procedure was well-tolerated, and the patient was discharged thirty minutes following the injection in good  condition. COMPLICATIONS: None IMPRESSION: Technically successful epidural injection on the right at L5-S1. Electronically Signed   By: Nelson Chimes M.D.   On: 05/06/2018 09:38    Assessment & Plan:   There are no diagnoses linked to this encounter.   No orders of the defined types were placed in this encounter.    Follow-up: No follow-ups on file.  [name], MD

## 2018-12-02 NOTE — Assessment & Plan Note (Signed)
Cont w/current meds 

## 2018-12-02 NOTE — Assessment & Plan Note (Signed)
Off statins 

## 2018-12-03 LAB — MONONUCLEOSIS SCREEN: Mono Screen: NEGATIVE

## 2018-12-04 LAB — PROTEIN ELECTROPHORESIS, SERUM
Albumin ELP: 4.4 g/dL (ref 3.8–4.8)
Alpha 1: 0.3 g/dL (ref 0.2–0.3)
Alpha 2: 0.6 g/dL (ref 0.5–0.9)
Beta 2: 0.3 g/dL (ref 0.2–0.5)
Beta Globulin: 0.4 g/dL (ref 0.4–0.6)
Gamma Globulin: 0.6 g/dL — ABNORMAL LOW (ref 0.8–1.7)
Total Protein: 6.6 g/dL (ref 6.1–8.1)

## 2018-12-04 LAB — IRON,TIBC AND FERRITIN PANEL
%SAT: 56 % (calc) — ABNORMAL HIGH (ref 20–48)
Ferritin: 340 ng/mL (ref 24–380)
Iron: 159 ug/dL (ref 50–180)
TIBC: 284 mcg/dL (calc) (ref 250–425)

## 2018-12-17 ENCOUNTER — Other Ambulatory Visit: Payer: Self-pay | Admitting: Internal Medicine

## 2018-12-17 DIAGNOSIS — E039 Hypothyroidism, unspecified: Secondary | ICD-10-CM

## 2018-12-17 DIAGNOSIS — R5383 Other fatigue: Secondary | ICD-10-CM

## 2018-12-18 DIAGNOSIS — I25810 Atherosclerosis of coronary artery bypass graft(s) without angina pectoris: Secondary | ICD-10-CM | POA: Diagnosis not present

## 2018-12-18 DIAGNOSIS — R5383 Other fatigue: Secondary | ICD-10-CM | POA: Diagnosis not present

## 2018-12-18 DIAGNOSIS — E039 Hypothyroidism, unspecified: Secondary | ICD-10-CM | POA: Diagnosis not present

## 2018-12-18 DIAGNOSIS — Z6824 Body mass index (BMI) 24.0-24.9, adult: Secondary | ICD-10-CM | POA: Diagnosis not present

## 2018-12-30 ENCOUNTER — Encounter: Payer: Self-pay | Admitting: Internal Medicine

## 2018-12-30 ENCOUNTER — Ambulatory Visit (INDEPENDENT_AMBULATORY_CARE_PROVIDER_SITE_OTHER): Payer: Medicare Other | Admitting: Internal Medicine

## 2018-12-30 DIAGNOSIS — E039 Hypothyroidism, unspecified: Secondary | ICD-10-CM | POA: Diagnosis not present

## 2018-12-30 DIAGNOSIS — I25810 Atherosclerosis of coronary artery bypass graft(s) without angina pectoris: Secondary | ICD-10-CM

## 2018-12-30 DIAGNOSIS — M47816 Spondylosis without myelopathy or radiculopathy, lumbar region: Secondary | ICD-10-CM | POA: Diagnosis not present

## 2018-12-30 DIAGNOSIS — R5383 Other fatigue: Secondary | ICD-10-CM | POA: Diagnosis not present

## 2018-12-30 MED ORDER — LEVOTHYROXINE SODIUM 25 MCG PO TABS: ORAL_TABLET | ORAL | 3 refills | Status: DC

## 2018-12-30 NOTE — Assessment & Plan Note (Signed)
Using Levothroid differently per Endo

## 2018-12-30 NOTE — Progress Notes (Signed)
Virtual Visit via Telephone Note  I connected with Richard Ellis on 12/30/18 at 11:00 AM EDT by telephone and verified that I am speaking with the correct person using two identifiers.   I discussed the limitations, risks, security and privacy concerns of performing an evaluation and management service by telephone and the availability of in person appointments. I also discussed with the patient that there may be a patient responsible charge related to this service. The patient expressed understanding and agreed to proceed.   History of Present Illness:   F/u fatigue - 10-15% better F/u hypothyroidism   Observations/Objective:  Looks well Assessment and Plan: See Plan  Follow Up Instructions:    I discussed the assessment and treatment plan with the patient. The patient was provided an opportunity to ask questions and all were answered. The patient agreed with the plan and demonstrated an understanding of the instructions.   The patient was advised to call back or seek an in-person evaluation if the symptoms worsen or if the condition fails to improve as anticipated.  I provided 15 minutes of non-face-to-face time during this encounter.   [name], MD

## 2018-12-30 NOTE — Assessment & Plan Note (Signed)
Post-viral vs other - better

## 2019-01-20 DIAGNOSIS — M47816 Spondylosis without myelopathy or radiculopathy, lumbar region: Secondary | ICD-10-CM | POA: Diagnosis not present

## 2019-01-22 DIAGNOSIS — E039 Hypothyroidism, unspecified: Secondary | ICD-10-CM | POA: Diagnosis not present

## 2019-01-22 DIAGNOSIS — R5383 Other fatigue: Secondary | ICD-10-CM | POA: Diagnosis not present

## 2019-01-29 DIAGNOSIS — Z7189 Other specified counseling: Secondary | ICD-10-CM | POA: Diagnosis not present

## 2019-01-29 DIAGNOSIS — E039 Hypothyroidism, unspecified: Secondary | ICD-10-CM | POA: Diagnosis not present

## 2019-01-29 DIAGNOSIS — I25810 Atherosclerosis of coronary artery bypass graft(s) without angina pectoris: Secondary | ICD-10-CM | POA: Diagnosis not present

## 2019-01-29 DIAGNOSIS — R5383 Other fatigue: Secondary | ICD-10-CM | POA: Diagnosis not present

## 2019-01-29 DIAGNOSIS — Z6824 Body mass index (BMI) 24.0-24.9, adult: Secondary | ICD-10-CM | POA: Diagnosis not present

## 2019-02-17 IMAGING — XA Imaging study
2 series · 2 of 2 positions shown · non-contrast
Comparison: none

CLINICAL DATA: Spondylosis without myelopathy. Improvement from
previous injections but with some persistent discomfort. Symptoms
worse on the right than the left presently.

[Series 1: ortho standard · 1 of 1 slices shown (1 of 2)]
[im 1/1]
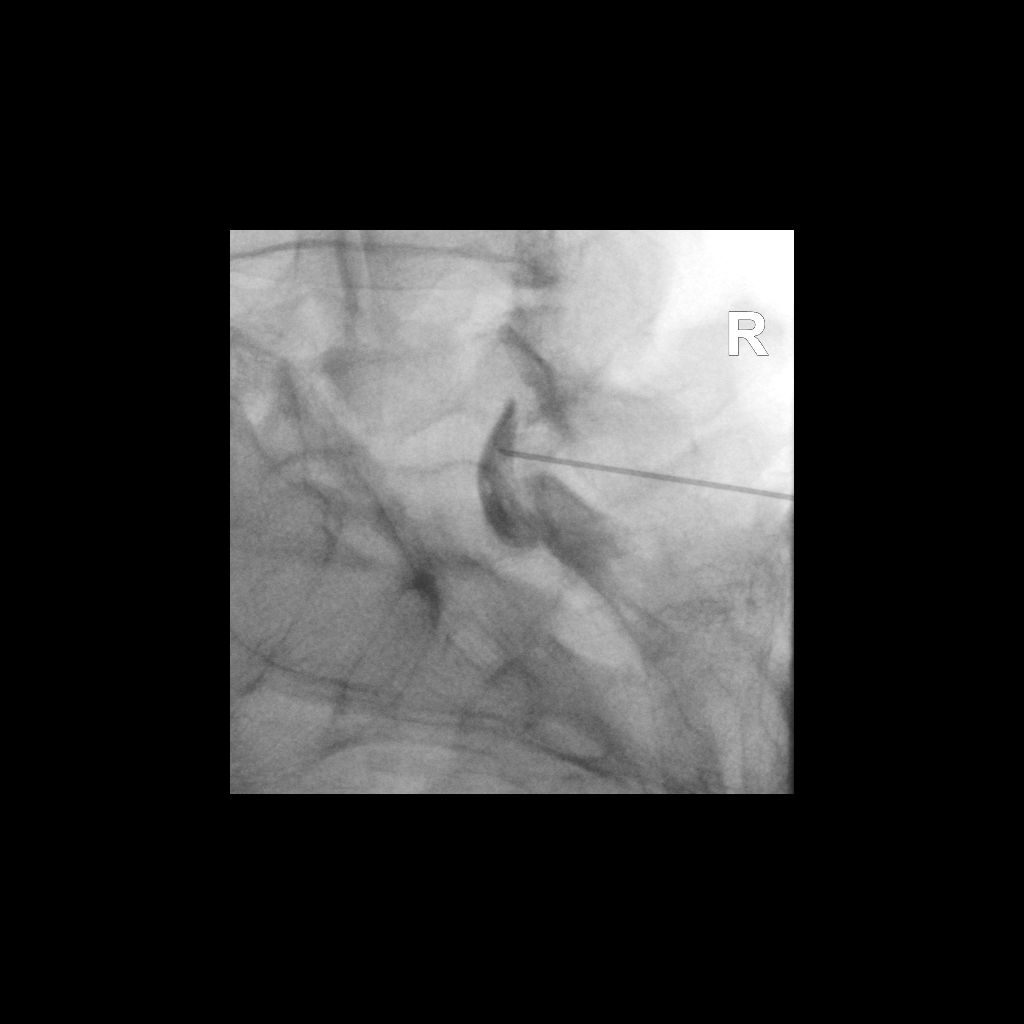

[Series 2: ortho standard · 1 of 1 slices shown (2 of 2)]
[im 1/1]
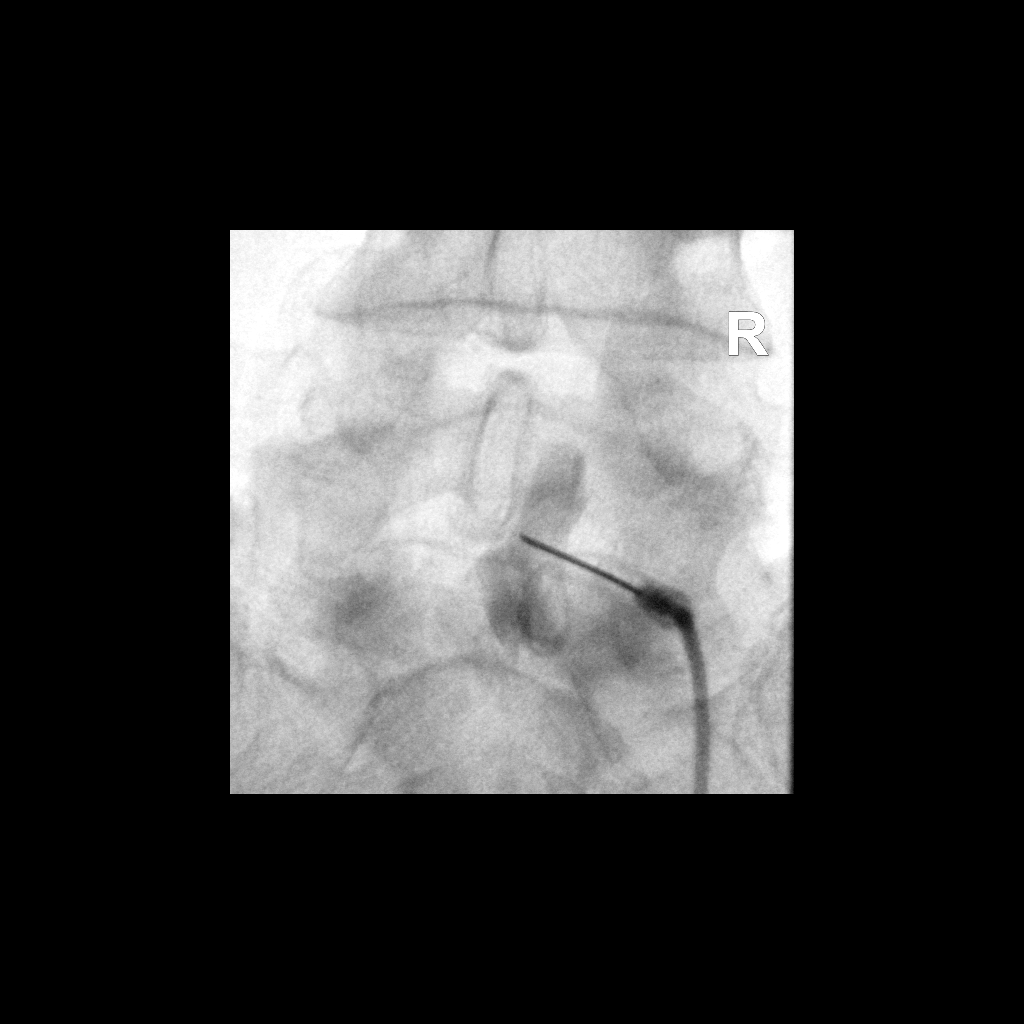

[2 of 2 positions shown; findings below may reference images not displayed]

FLUOROSCOPY TIME:  0 minutes 20 seconds. 21.44 micro gray meter
squared

PROCEDURE:
The procedure, risks, benefits, and alternatives were explained to
the patient. Questions regarding the procedure were encouraged and
answered. The patient understands and consents to the procedure.

LUMBAR EPIDURAL INJECTION:

An interlaminar approach was performed on the right at L5-S1. The
overlying skin was cleansed and anesthetized. A 20 gauge epidural
needle was advanced using loss-of-resistance technique.

DIAGNOSTIC EPIDURAL INJECTION:

Injection of Isovue-M 200 shows a good epidural pattern with spread
above and below the level of needle placement, primarily on the
right. No vascular opacification is seen.

THERAPEUTIC EPIDURAL INJECTION:

One hundred twenty mg of Depo-Medrol mixed with 2.5 cc 1% lidocaine
were instilled. The procedure was well-tolerated, and the patient
was discharged thirty minutes following the injection in good
condition.

COMPLICATIONS:
None
IMPRESSION: Technically successful epidural injection on the right at L5-S1.

## 2019-02-24 ENCOUNTER — Telehealth: Payer: Self-pay | Admitting: *Deleted

## 2019-02-24 NOTE — Telephone Encounter (Signed)
Patient does not want to do a virtual visit. He said he needs to be seen in person. Please schedule on [name]'s next in office day.

## 2019-02-24 NOTE — Telephone Encounter (Signed)
Lvm for pt to call back to set up for virtual visit

## 2019-03-02 DIAGNOSIS — M47816 Spondylosis without myelopathy or radiculopathy, lumbar region: Secondary | ICD-10-CM | POA: Diagnosis not present

## 2019-03-03 ENCOUNTER — Ambulatory Visit: Payer: Medicare Other | Admitting: Physician Assistant

## 2019-04-09 ENCOUNTER — Telehealth: Payer: Self-pay | Admitting: Internal Medicine

## 2019-04-09 DIAGNOSIS — N5201 Erectile dysfunction due to arterial insufficiency: Secondary | ICD-10-CM | POA: Diagnosis not present

## 2019-04-09 DIAGNOSIS — R972 Elevated prostate specific antigen [PSA]: Secondary | ICD-10-CM | POA: Diagnosis not present

## 2019-04-09 DIAGNOSIS — R351 Nocturia: Secondary | ICD-10-CM | POA: Diagnosis not present

## 2019-04-09 NOTE — Telephone Encounter (Signed)
Richard Ellis is calling from [facility]. Calling to find out when was the patients last PSA test.Please advise thank you [phone]

## 2019-04-09 NOTE — Telephone Encounter (Signed)
Left detailed message informing [facility] urology that the last PSA we checked was 04/19/16 and the value was 4.83. See labs.

## 2019-04-17 DIAGNOSIS — R972 Elevated prostate specific antigen [PSA]: Secondary | ICD-10-CM | POA: Diagnosis not present

## 2019-04-20 ENCOUNTER — Telehealth: Payer: Self-pay

## 2019-04-20 NOTE — Telephone Encounter (Signed)

## 2019-04-21 ENCOUNTER — Encounter: Payer: Self-pay | Admitting: Physician Assistant

## 2019-04-21 ENCOUNTER — Ambulatory Visit (INDEPENDENT_AMBULATORY_CARE_PROVIDER_SITE_OTHER): Payer: Medicare Other | Admitting: Physician Assistant

## 2019-04-21 ENCOUNTER — Other Ambulatory Visit: Payer: Self-pay

## 2019-04-21 ENCOUNTER — Ambulatory Visit: Payer: Medicare Other | Admitting: Physician Assistant

## 2019-04-21 VITALS — BP 120/78 | HR 50 | Ht 73.0 in | Wt 183.0 lb

## 2019-04-21 DIAGNOSIS — Q211 Atrial septal defect: Secondary | ICD-10-CM | POA: Diagnosis not present

## 2019-04-21 DIAGNOSIS — I2510 Atherosclerotic heart disease of native coronary artery without angina pectoris: Secondary | ICD-10-CM

## 2019-04-21 DIAGNOSIS — I25810 Atherosclerosis of coronary artery bypass graft(s) without angina pectoris: Secondary | ICD-10-CM

## 2019-04-21 DIAGNOSIS — Q2112 Patent foramen ovale: Secondary | ICD-10-CM

## 2019-04-21 NOTE — Progress Notes (Signed)
Cardiology Office Note:    Date:  04/21/2019   ID:  BAYARD MORE, DOB May 14, 1946, MRN [id number]  PCP:  Cassandria Anger, MD  Cardiologist:  [name], MD  Electrophysiologist:  None   Referring MD: Cassandria Anger, MD   Chief Complaint  Patient presents with  . Follow-up    CAD    History of Present Illness:    ABDISHAKUR GOTTSCHALL is a 73 y.o. male with:  Coronary artery disease  Cath 2/17: Severe mid LAD stenosis >> DES to the mid LAD  Status post CABG 2018 (LIMA-LAD, SVG-D1)  Patent foramen ovale status post surgical closure  Prior CVA  Cryptogenic CVA 2014 >> PFO with R>L shunt noted; treated with Plavix  Hyperlipidemia  Intol of statins, PCSK9i  >> Diet Rx  Hypothyroidism  Anemia  Bradycardia -no beta-blocker  Mr. Heslin was last seen by Dr. Burt Knack in June 2019.  He returns for follow-up.  He is here alone.  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  He runs the 800 and 5K.  He has noted that his max heart rate has been higher than usual.  He wondered if this had anything to do with his surgery.  He has had some back issues and has had to slack off in some of his training.  He has had some numbness around his incision site as well as some occasional discomfort with palpation.  He has not had exertional angina, exertional shortness of breath, syncope, leg swelling or paroxysmal nocturnal dyspnea  Prior CV studies:   The following studies were reviewed today:  Intraoperative TEE 10/15/2016  Left ventricle: Normal cavity size and wall thickness. LV systolic function is normal with an EF of 55-60%. There are no obvious wall motion abnormalities.  Septum: No shunt visualized post-repair No shunt visualized post-repair . Small Patent Foramen Ovale present with left to right shunt visualized by color doppler.  Left atrium: Patent foramen ovale present with left to right shunting indicated by color flow Doppler.   Aortic valve: The valve is trileaflet. No stenosis. Trace regurgitation.  Mitral valve: Trace regurgitation.  Right ventricle: Normal cavity size and ejection fraction.  Pulmonic valve: Trace regurgitation.  Carotid US 10/11/2016 Bilateral ICA 1-39  Echo 10/10/2016 Moderate LVH, EF 60-65, normal wall motion, grade 1 diastolic dysfunction, mild AI, trivial MR, moderate LAE, PFO  Cardiac catheterization 10/09/2016 LM normal LAD proximal 90, mid stent patent OM1 30 RCA proximal 40 EF 50-55 1. Severe mid LAD stenosis with complex disease because of heavy calcification and angulation of the vessel 2. Widely patent left main, left circumflex, and right coronary artery with mild diffuse nonobstructive disease 3. Mild segmental LV dysfunction with distal anterior and apical hypokinesis, LVEF estimated at 50%. 4. Unsuccessful PCI of the LAD despite prolonged effort at balloon dilatation using multiple balloons and guidewires, supportive guide catheters and a Guidezilla, and repeated dilatations with both; compliant and noncompliant balloons. Procedure was unsuccessful because of inability to deliver a stent.  At the completion of the procedure there is a non--flow-limiting dissection present. There is TIMI 3 flow and the patient is chest pain-free. Anticipate a period of Plavix washout. If the patient is stable tomorrow without symptoms on ambulation, it would be reasonable to allow him to go home with plans for surgical consultation and LIMA to LAD bypass in the near future after Plavix washout.   Past Medical History:  Diagnosis Date  . Allergy  rhinitis  . Anemia    "Hgb always on the low side" (07/02/2013)  . CAD (coronary artery disease)    a. LHC 2/17: pLAD 50, mLAD 95, pRCA 30, dRCA 25, EF 50-55% >> PCI:  3 x 18 mm Resolute DES to mLAD   . Chronic bronchitis ([place])    "not in the last year since I started taking allergy shots" (10/09/2016)  . Chronic lower back pain   . Chronic  sinus bradycardia 07/02/2013  . Coronary artery disease 10/15/2016  . Cryptogenic stroke ([place]) 07/02/2013   a. "small ischemic occipital right sided" (07/02/2013)  //  b. Event monitor 10/14: sinus brady  //  c. Carotid US 10/14: bilat ICA 1-39%  . Esophageal stricture   . Exertional angina ([place]) 10/09/2016  . Heart murmur    "as a child" (07/02/2013)  . History of echocardiogram    a. Echo 10/14: EF 60-65%, no RWMA, normal diastolic function  . Hyperlipidemia   . Hypothyroidism   . Nonallopathic lesion of lumbosacral region 06/04/2017  . Nonallopathic lesion of sacral region 06/04/2017  . Nonallopathic lesion of thoracic region 06/04/2017  . Osteoarthritis    "lower back" (10/09/2016)  . PFO (patent foramen ovale)    a. TEE 11/14: mild LVH, EF 60-65%, small PFO, R-L shunt   Surgical Hx: The patient  has a past surgical history that includes Esophagogastroduodenoscopy (N/A, 10/26/2012); Tonsillectomy (1950's); Esophagogastroduodenoscopy (N/A, 07/08/2013); Savory dilation (N/A, 07/08/2013); TEE without cardioversion (N/A, 07/28/2013); Cardiac catheterization (N/A, 11/10/2015); Cardiac catheterization (N/A, 11/10/2015); Cardiac catheterization (10/09/2016); Inner ear surgery (Left, 1957); Cardiac catheterization (N/A, 10/09/2016); Cardiac catheterization (N/A, 10/09/2016); Coronary artery bypass graft (N/A, 10/15/2016); TEE without cardioversion (N/A, 10/15/2016); and Repair of patent foramen ovale (N/A, 10/15/2016).   Current Medications: Current Meds  Medication Sig  . aspirin EC 81 MG tablet Take 1 tablet (81 mg total) by mouth daily.  . Cholecalciferol 2000 UNITS CAPS Take 2,000 Units by mouth every evening.   . Ferrous Sulfate (IRON) 325 (65 Fe) MG TABS Take 1 tablet by mouth daily.  [name] levothyroxine (SYNTHROID, LEVOTHROID) 25 MCG tablet 1 tab a day, except for mon-wed-fri 2 tabs a day  . Omega-3 Fatty Acids (FISH OIL) 1200 MG CAPS Take 1,200 mg by mouth daily.   . Turmeric 500 MG CAPS Take 1  capsule by mouth at bedtime.     Allergies:   Atorvastatin, Crestor [rosuvastatin], Demerol [meperidine], Niacin and related, and Repatha [evolocumab]   Social History   Tobacco Use  . Smoking status: Never Smoker  . Smokeless tobacco: Never Used  Substance Use Topics  . Alcohol use: Yes    Alcohol/week: 8.0 standard drinks    Types: 4 Glasses of wine, 4 Cans of beer per week    Comment: weekly  . Drug use: No     Family Hx: The patient's family history includes Coronary artery disease in an other family member; Heart disease in his father. There is no history of Colon cancer.  ROS:   Please see the history of present illness.    ROS All other systems reviewed and are negative.   EKGs/Labs/Other Test Reviewed:    EKG:  EKG is  ordered today.  The ekg ordered today demonstrates sinus bradycardia, heart rate 50, rightward axis, nonspecific ST-T wave changes, inferior Q waves, probable anterior Q waves, early repolarization (especially in inferior leads), QTC 413, similar to prior tracings  Recent Labs: 12/02/2018: ALT 16; BUN 19; Creatinine, Ser 1.05; Hemoglobin 14.1; Platelets 209.0; Potassium  4.1; Sodium 138; TSH 3.37   Recent Lipid Panel Lab Results  Component Value Date/Time   CHOL 122 (L) 04/18/2016 08:16 AM   CHOL 230 (H) 11/09/2014 12:00 PM   TRIG 115 04/18/2016 08:16 AM   TRIG 152 (H) 11/09/2014 12:00 PM   TRIG 228 (HH) 07/30/2006 07:41 AM   HDL 52 04/18/2016 08:16 AM   HDL 64 11/09/2014 12:00 PM   CHOLHDL 2.3 04/18/2016 08:16 AM   LDLCALC 47 04/18/2016 08:16 AM   LDLCALC 136 (H) 11/09/2014 12:00 PM   LDLDIRECT 122.0 05/09/2015 10:06 AM    Physical Exam:    VS:  BP 120/78   Pulse (!) 50   Ht 6\' 1"  (1.854 m)   Wt 183 lb (83 kg)   BMI 24.14 kg/m     Wt Readings from Last 3 Encounters:  04/21/19 183 lb (83 kg)  12/02/18 187 lb (84.8 kg)  09/24/18 189 lb (85.7 kg)     Physical Exam  Constitutional: He is oriented to person, place, and time. He appears  well-developed and well-nourished. No distress.  HENT:  Head: Normocephalic and atraumatic.  Neck: Neck supple. No JVD present.  Cardiovascular: Normal rate, regular rhythm, S1 normal and S2 normal.  No murmur heard. Pulmonary/Chest: Breath sounds normal. He has no rales.  Median sternotomy well-healed; no instability noted with palpation  Abdominal: Soft. There is no hepatomegaly.  Musculoskeletal:        General: No edema.  Neurological: He is alert and oriented to person, place, and time.  Skin: Skin is warm and dry.    ASSESSMENT & PLAN:    1. Coronary artery disease involving native coronary artery of native heart without angina pectoris History of prior DES to the LAD in 2017 and subsequent CABG in 2018.  Doing well without anginal symptoms.  His incision site is stable.  I suspect he still having some paresthesias related to his surgery.  Chest x-ray March 2020 demonstrated good union of his sternum.  Continue aspirin.  He is intolerant of statin drugs as well as PCSK9 inhibitors.  I suspect his higher peak HR with training is related to deconditioning related to recent issues with his back.    2. Patent foramen ovale Status post closure.  Continue aspirin.   Dispo:  Return in about 1 year (around 04/20/2020) for Routine Follow Up, w/ Dr. Burt Knack, or Richardson Dopp, PA-C.   Medication Adjustments/Labs and Tests Ordered: Current medicines are reviewed at length with the patient today.  Concerns regarding medicines are outlined above.  Tests Ordered: Orders Placed This Encounter  Procedures  . EKG 12-Lead   Medication Changes: No orders of the defined types were placed in this encounter.   Signed, Richardson Dopp, PA-C  04/21/2019 5:03 PM    [facility] [address], [place], [place]  [address] Phone: [phone]; Fax: [phone]

## 2019-04-21 NOTE — Patient Instructions (Signed)
Medication Instructions:  Your physician recommends that you continue on your current medications as directed. Please refer to the Current Medication list given to you today.  If you need a refill on your cardiac medications before your next appointment, please call your pharmacy.   Lab work: -None If you have labs (blood work) drawn today and your tests are completely normal, you will receive your results only by: [name] MyChart Message (if you have MyChart) OR . A paper copy in the mail If you have any lab test that is abnormal or we need to change your treatment, we will call you to review the results.  Testing/Procedures: -None  Follow-Up: At [facility], you and your health needs are our priority.  As part of our continuing mission to provide you with exceptional heart care, we have created designated Provider Care Teams.  These Care Teams include your primary Cardiologist (physician) and Advanced Practice Providers (APPs -  Physician Assistants and Nurse Practitioners) who all work together to provide you with the care you need, when you need it. You will need a follow up appointment in:  1 years.  Please call our office 2 months in advance to schedule this appointment.  You may see [name], MD or one of the following Advanced Practice Providers on your designated Care Team: Richardson Dopp, PA-C [place], [place] . Daune Perch, NP  Any Other Special Instructions Will Be Listed Below (If Applicable).

## 2019-05-27 ENCOUNTER — Encounter: Payer: Self-pay | Admitting: Family Medicine

## 2019-06-22 ENCOUNTER — Other Ambulatory Visit: Payer: Self-pay | Admitting: *Deleted

## 2019-06-22 DIAGNOSIS — Z20822 Contact with and (suspected) exposure to covid-19: Secondary | ICD-10-CM

## 2019-06-22 DIAGNOSIS — Z20828 Contact with and (suspected) exposure to other viral communicable diseases: Secondary | ICD-10-CM | POA: Diagnosis not present

## 2019-06-24 LAB — NOVEL CORONAVIRUS, NAA: SARS-CoV-2, NAA: NOT DETECTED

## 2019-07-16 DIAGNOSIS — Z20828 Contact with and (suspected) exposure to other viral communicable diseases: Secondary | ICD-10-CM | POA: Diagnosis not present

## 2019-07-22 ENCOUNTER — Other Ambulatory Visit: Payer: Self-pay

## 2019-07-22 DIAGNOSIS — Z20822 Contact with and (suspected) exposure to covid-19: Secondary | ICD-10-CM

## 2019-07-22 DIAGNOSIS — Z20828 Contact with and (suspected) exposure to other viral communicable diseases: Secondary | ICD-10-CM | POA: Diagnosis not present

## 2019-07-23 LAB — NOVEL CORONAVIRUS, NAA: SARS-CoV-2, NAA: NOT DETECTED

## 2019-10-01 DIAGNOSIS — M47816 Spondylosis without myelopathy or radiculopathy, lumbar region: Secondary | ICD-10-CM | POA: Diagnosis not present

## 2019-10-08 ENCOUNTER — Ambulatory Visit: Payer: Medicare Other | Attending: Internal Medicine

## 2019-10-08 DIAGNOSIS — Z23 Encounter for immunization: Secondary | ICD-10-CM | POA: Insufficient documentation

## 2019-10-08 NOTE — Progress Notes (Signed)
   Covid-19 Vaccination Clinic  Name:  Richard Ellis    MRN: [id number] DOB: [birth year]  10/08/2019  [name] was observed post Covid-19 immunization for 15 minutes without incidence. He was provided with Vaccine Information Sheet and instruction to access the V-Safe system.   [name] was instructed to call 911 with any severe reactions post vaccine: [name] Difficulty breathing  . Swelling of your face and throat  . A fast heartbeat  . A bad rash all over your body  . Dizziness and weakness    Immunizations Administered    Name Date Dose VIS Date Route   Pfizer COVID-19 Vaccine 10/08/2019  4:34 PM 0.3 mL 08/28/2019 Intramuscular   Manufacturer: [place]   Lot: [id number]   [place]: [id number]

## 2019-10-27 DIAGNOSIS — M5136 Other intervertebral disc degeneration, lumbar region: Secondary | ICD-10-CM | POA: Diagnosis not present

## 2019-10-27 DIAGNOSIS — M47816 Spondylosis without myelopathy or radiculopathy, lumbar region: Secondary | ICD-10-CM | POA: Diagnosis not present

## 2019-10-27 DIAGNOSIS — I10 Essential (primary) hypertension: Secondary | ICD-10-CM | POA: Insufficient documentation

## 2019-10-29 ENCOUNTER — Ambulatory Visit: Payer: Medicare Other | Attending: Internal Medicine

## 2019-10-29 DIAGNOSIS — Z23 Encounter for immunization: Secondary | ICD-10-CM | POA: Insufficient documentation

## 2019-10-29 NOTE — Progress Notes (Signed)
   Covid-19 Vaccination Clinic  Name:  Richard Ellis    MRN: [id number] DOB: October 24, 1945  10/29/2019  [name] was observed post Covid-19 immunization for 15 minutes without incidence. He was provided with Vaccine Information Sheet and instruction to access the V-Safe system.   [name] was instructed to call 911 with any severe reactions post vaccine: [name] Difficulty breathing  . Swelling of your face and throat  . A fast heartbeat  . A bad rash all over your body  . Dizziness and weakness    Immunizations Administered    Name Date Dose VIS Date Route   Pfizer COVID-19 Vaccine 10/29/2019  4:18 PM 0.3 mL 08/28/2019 Intramuscular   Manufacturer: [place]   Lot: [id number]   [place]: [id number]

## 2019-11-07 ENCOUNTER — Other Ambulatory Visit: Payer: Self-pay | Admitting: Neurosurgery

## 2019-11-07 DIAGNOSIS — M5136 Other intervertebral disc degeneration, lumbar region: Secondary | ICD-10-CM

## 2019-11-28 ENCOUNTER — Ambulatory Visit
Admission: RE | Admit: 2019-11-28 | Discharge: 2019-11-28 | Disposition: A | Discharge status: Home / Self Care | Payer: Medicare Other | Source: Ambulatory Visit | Attending: Neurosurgery | Admitting: Neurosurgery

## 2019-11-28 DIAGNOSIS — M5136 Other intervertebral disc degeneration, lumbar region: Secondary | ICD-10-CM

## 2019-11-28 DIAGNOSIS — M48061 Spinal stenosis, lumbar region without neurogenic claudication: Secondary | ICD-10-CM | POA: Diagnosis not present

## 2019-12-03 DIAGNOSIS — M5136 Other intervertebral disc degeneration, lumbar region: Secondary | ICD-10-CM | POA: Diagnosis not present

## 2019-12-03 DIAGNOSIS — M48061 Spinal stenosis, lumbar region without neurogenic claudication: Secondary | ICD-10-CM | POA: Insufficient documentation

## 2019-12-22 DIAGNOSIS — M5416 Radiculopathy, lumbar region: Secondary | ICD-10-CM | POA: Diagnosis not present

## 2020-01-11 DIAGNOSIS — M5136 Other intervertebral disc degeneration, lumbar region: Secondary | ICD-10-CM | POA: Diagnosis not present

## 2020-01-11 DIAGNOSIS — M48061 Spinal stenosis, lumbar region without neurogenic claudication: Secondary | ICD-10-CM | POA: Diagnosis not present

## 2020-01-11 DIAGNOSIS — M47816 Spondylosis without myelopathy or radiculopathy, lumbar region: Secondary | ICD-10-CM | POA: Diagnosis not present

## 2020-01-16 DIAGNOSIS — M545 Low back pain: Secondary | ICD-10-CM | POA: Diagnosis not present

## 2020-01-16 DIAGNOSIS — M5127 Other intervertebral disc displacement, lumbosacral region: Secondary | ICD-10-CM | POA: Diagnosis not present

## 2020-01-16 DIAGNOSIS — M5417 Radiculopathy, lumbosacral region: Secondary | ICD-10-CM | POA: Diagnosis not present

## 2020-01-16 DIAGNOSIS — M48061 Spinal stenosis, lumbar region without neurogenic claudication: Secondary | ICD-10-CM | POA: Diagnosis not present

## 2020-01-21 DIAGNOSIS — M5127 Other intervertebral disc displacement, lumbosacral region: Secondary | ICD-10-CM | POA: Diagnosis not present

## 2020-01-21 DIAGNOSIS — M48061 Spinal stenosis, lumbar region without neurogenic claudication: Secondary | ICD-10-CM | POA: Diagnosis not present

## 2020-01-21 DIAGNOSIS — M545 Low back pain: Secondary | ICD-10-CM | POA: Diagnosis not present

## 2020-01-21 DIAGNOSIS — M5417 Radiculopathy, lumbosacral region: Secondary | ICD-10-CM | POA: Diagnosis not present

## 2020-01-25 DIAGNOSIS — E039 Hypothyroidism, unspecified: Secondary | ICD-10-CM | POA: Diagnosis not present

## 2020-01-28 DIAGNOSIS — I25810 Atherosclerosis of coronary artery bypass graft(s) without angina pectoris: Secondary | ICD-10-CM | POA: Diagnosis not present

## 2020-01-28 DIAGNOSIS — Z6824 Body mass index (BMI) 24.0-24.9, adult: Secondary | ICD-10-CM | POA: Diagnosis not present

## 2020-01-28 DIAGNOSIS — R5383 Other fatigue: Secondary | ICD-10-CM | POA: Diagnosis not present

## 2020-01-28 DIAGNOSIS — Z7189 Other specified counseling: Secondary | ICD-10-CM | POA: Diagnosis not present

## 2020-01-28 DIAGNOSIS — E039 Hypothyroidism, unspecified: Secondary | ICD-10-CM | POA: Diagnosis not present

## 2020-01-29 DIAGNOSIS — M5417 Radiculopathy, lumbosacral region: Secondary | ICD-10-CM | POA: Diagnosis not present

## 2020-01-29 DIAGNOSIS — M48061 Spinal stenosis, lumbar region without neurogenic claudication: Secondary | ICD-10-CM | POA: Diagnosis not present

## 2020-01-29 DIAGNOSIS — M5127 Other intervertebral disc displacement, lumbosacral region: Secondary | ICD-10-CM | POA: Diagnosis not present

## 2020-01-29 DIAGNOSIS — M545 Low back pain: Secondary | ICD-10-CM | POA: Diagnosis not present

## 2020-02-05 DIAGNOSIS — M5416 Radiculopathy, lumbar region: Secondary | ICD-10-CM | POA: Diagnosis not present

## 2020-02-05 DIAGNOSIS — M48061 Spinal stenosis, lumbar region without neurogenic claudication: Secondary | ICD-10-CM | POA: Diagnosis not present

## 2020-02-08 DIAGNOSIS — M5416 Radiculopathy, lumbar region: Secondary | ICD-10-CM | POA: Diagnosis not present

## 2020-02-08 DIAGNOSIS — M5137 Other intervertebral disc degeneration, lumbosacral region: Secondary | ICD-10-CM | POA: Diagnosis not present

## 2020-04-08 ENCOUNTER — Telehealth: Payer: Self-pay | Admitting: Internal Medicine

## 2020-04-08 NOTE — Telephone Encounter (Signed)
New message:   Pt is calling and states Dr. Camila Li normally sends in a cough syrup for him when he gets bronchitis every year. Pt would like to know if that can be sent in to [facility] [place], [place]. I have advised the pt that the Dr is not in the office today. Please advise.

## 2020-04-11 ENCOUNTER — Telehealth: Payer: Self-pay | Admitting: Internal Medicine

## 2020-04-11 MED ORDER — PROMETHAZINE-CODEINE 6.25-10 MG/5ML PO SYRP: 5.0000 mL | ORAL_SOLUTION | ORAL | 0 refills | Status: DC | PRN

## 2020-04-11 NOTE — Telephone Encounter (Signed)
Pt is requesting a cough syrup please advise

## 2020-04-11 NOTE — Telephone Encounter (Signed)
Done. Thanks.

## 2020-04-11 NOTE — Telephone Encounter (Signed)
Ok Done OV if problems Thx

## 2020-04-12 ENCOUNTER — Telehealth (INDEPENDENT_AMBULATORY_CARE_PROVIDER_SITE_OTHER): Payer: Medicare Other | Admitting: Family

## 2020-04-12 DIAGNOSIS — R05 Cough: Secondary | ICD-10-CM

## 2020-04-12 DIAGNOSIS — R059 Cough, unspecified: Secondary | ICD-10-CM

## 2020-04-12 MED ORDER — AZITHROMYCIN 250 MG PO TABS: ORAL_TABLET | ORAL | 0 refills | Status: DC

## 2020-04-12 NOTE — Telephone Encounter (Signed)
See med refill.

## 2020-04-12 NOTE — Progress Notes (Signed)
Richard Ellis is a 74 y.o. male with the following history as recorded in EpicCare:  Patient Active Problem List   Diagnosis Date Noted  . Nonallopathic lesion of lumbosacral region 06/04/2017  . Nonallopathic lesion of sacral region 06/04/2017  . Nonallopathic lesion of thoracic region 06/04/2017  . Blood loss anemia 10/29/2016  . Coronary artery disease 10/15/2016  . Exertional angina ([place]) 10/09/2016  . Coronary artery disease with exertional angina ([place]) 11/11/2015  . Patent foramen ovale 11/07/2015  . Labral tear of hip, degenerative 11/07/2015  . Tear of left hamstring 11/07/2015  . Left lumbar radiculopathy 09/06/2015  . Otitis, externa, infective 12/23/2014  . Dyslipidemia 06/08/2014  . Gluteus medius or minimus syndrome 02/25/2014  . History of CVA (cerebrovascular accident) 07/02/2013  . Chronic sinus bradycardia 07/02/2013  . Actinic keratoses 01/29/2013  . Elevated PSA 08/13/2012  . Well adult exam 08/11/2012  . HIP PAIN 11/14/2010  . GROIN PAIN 11/14/2010  . Neoplasm of uncertain behavior of skin 10/27/2010  . Asthma 10/27/2010  . OSTEOARTHRITIS 10/27/2010  . Myalgia 10/27/2010  . Fatigue 12/23/2008  . CBC, ABNORMAL 10/07/2008  . Other abnormal glucose 11/04/2007  . Hypothyroidism 07/10/2007  . Hyperlipidemia 07/10/2007    Current Outpatient Medications  Medication Sig Dispense Refill  . aspirin EC 81 MG tablet Take 1 tablet (81 mg total) by mouth daily. 90 tablet 3  . azithromycin (ZITHROMAX) 250 MG tablet 2 tabs po qd x 1 day; 1 tablet per day x 4 days; 6 tablet 0  . Cholecalciferol 2000 UNITS CAPS Take 2,000 Units by mouth every evening.     . Ferrous Sulfate (IRON) 325 (65 Fe) MG TABS Take 1 tablet by mouth daily.    [name] levothyroxine (SYNTHROID, LEVOTHROID) 25 MCG tablet 1 tab a day, except for mon-wed-fri 2 tabs a day 90 tablet 3  . Omega-3 Fatty Acids (FISH OIL) 1200 MG CAPS Take 1,200 mg by mouth daily.     . promethazine-codeine (PHENERGAN WITH CODEINE)  6.25-10 MG/5ML syrup Take 5 mLs by mouth every 4 (four) hours as needed. 300 mL 0  . Turmeric 500 MG CAPS Take 1 capsule by mouth at bedtime.     No current facility-administered medications for this visit.    Allergies: Atorvastatin, Crestor [rosuvastatin], Demerol [meperidine], Niacin and related, and Repatha [evolocumab]  Past Medical History:  Diagnosis Date  . Allergy    rhinitis  . Anemia    "Hgb always on the low side" (07/02/2013)  . CAD (coronary artery disease)    a. LHC 2/17: pLAD 50, mLAD 95, pRCA 30, dRCA 25, EF 50-55% >> PCI:  3 x 18 mm Resolute DES to mLAD   . Chronic bronchitis ([place])    "not in the last year since I started taking allergy shots" (10/09/2016)  . Chronic lower back pain   . Chronic sinus bradycardia 07/02/2013  . Coronary artery disease 10/15/2016  . Cryptogenic stroke ([place]) 07/02/2013   a. "small ischemic occipital right sided" (07/02/2013)  //  b. Event monitor 10/14: sinus brady  //  c. Carotid US 10/14: bilat ICA 1-39%  . Esophageal stricture   . Exertional angina ([place]) 10/09/2016  . Heart murmur    "as a child" (07/02/2013)  . History of echocardiogram    a. Echo 10/14: EF 60-65%, no RWMA, normal diastolic function  . Hyperlipidemia   . Hypothyroidism   . Nonallopathic lesion of lumbosacral region 06/04/2017  . Nonallopathic lesion of sacral region 06/04/2017  . Nonallopathic  lesion of thoracic region 06/04/2017  . Osteoarthritis    "lower back" (10/09/2016)  . PFO (patent foramen ovale)    a. TEE 11/14: mild LVH, EF 60-65%, small PFO, R-L shunt    Past Surgical History:  Procedure Laterality Date  . CARDIAC CATHETERIZATION N/A 11/10/2015   Procedure: Left Heart Cath and Coronary Angiography;  Surgeon: [name], MD;  Location: [place] [facility];  Service: Cardiovascular;  Laterality: N/A;  . CARDIAC CATHETERIZATION N/A 11/10/2015   Procedure: Coronary Stent Intervention;  Surgeon: [name], MD;  Location: [place] [facility];  Service:  Cardiovascular;  Laterality: N/A;  mid lad  3.0x18 resolute  . CARDIAC CATHETERIZATION  10/09/2016   "scheduled OHS for tomorrow" (10/09/2016)  . CARDIAC CATHETERIZATION N/A 10/09/2016   Procedure: Left Heart Cath and Coronary Angiography;  Surgeon: [name], MD;  Location: [place] [facility];  Service: Cardiovascular;  Laterality: N/A;  . CARDIAC CATHETERIZATION N/A 10/09/2016   Procedure: Coronary Balloon Angioplasty;  Surgeon: [name], MD;  Location: [place] [facility];  Service: Cardiovascular;  Laterality: N/A;  . CORONARY ARTERY BYPASS GRAFT N/A 10/15/2016   Procedure: CORONARY ARTERY BYPASS GRAFTING (CABG), ON PUMP, TIMES TWO, USING LEFT INTERNAL MAMMARY ARTERY AND RIGHT GREATER SAPHENOUS VEIN HARVESTED ENDOSCOPICALLY;  Surgeon: Ivin Poot, MD;  Location: [place];  Service: Open Heart Surgery;  Laterality: N/A;  LIMA to LAD, SVG to DIAGONAL  . ESOPHAGOGASTRODUODENOSCOPY N/A 10/26/2012   Procedure: ESOPHAGOGASTRODUODENOSCOPY (EGD);  Surgeon: Winfield Cunas., MD;  Location: [facility];  Service: Endoscopy;  Laterality: N/A;  . ESOPHAGOGASTRODUODENOSCOPY N/A 07/08/2013   Procedure: ESOPHAGOGASTRODUODENOSCOPY (EGD);  Surgeon: Winfield Cunas., MD;  Location: [facility];  Service: Endoscopy;  Laterality: N/A;  . INNER EAR SURGERY Left 1957   "related to ear infection"  . REPAIR OF PATENT FORAMEN OVALE N/A 10/15/2016   Procedure: REPAIR OF PATENT FORAMEN OVALE;  Surgeon: Ivin Poot, MD;  Location: [place];  Service: Open Heart Surgery;  Laterality: N/A;  . SAVORY DILATION N/A 07/08/2013   Procedure: SAVORY DILATION;  Surgeon: Winfield Cunas., MD;  Location: [facility];  Service: Endoscopy;  Laterality: N/A;  . TEE WITHOUT CARDIOVERSION N/A 07/28/2013   Procedure: TRANSESOPHAGEAL ECHOCARDIOGRAM (TEE);  Surgeon: Pixie Casino, MD;  Location: [facility];  Service: Cardiovascular;  Laterality: N/A;  . TEE WITHOUT CARDIOVERSION N/A 10/15/2016   Procedure:  TRANSESOPHAGEAL ECHOCARDIOGRAM (TEE);  Surgeon: Ivin Poot, MD;  Location: [place];  Service: Open Heart Surgery;  Laterality: N/A;  . TONSILLECTOMY  1950's    Family History  Problem Relation Age of Onset  . Coronary artery disease Other        male first degree relative <60  . Heart disease Father        heart attack  . Colon cancer Neg Hx     Social History   Tobacco Use  . Smoking status: Never Smoker  . Smokeless tobacco: Never Used  Substance Use Topics  . Alcohol use: Yes    Alcohol/week: 8.0 standard drinks    Types: 4 Glasses of wine, 4 Cans of beer per week    Comment: weekly    Subjective:    I connected with [name] on 04/12/20 at 12:00 PM EDT by a video enabled telemedicine application and verified that I am speaking with the correct person using two identifiers.   I discussed the limitations of evaluation and management by telemedicine and the availability of in  person appointments. The patient expressed understanding and agreed to proceed. Provider in office/ patient is at home; provider and patient are only 2 people on video call.   Complaining of dry tickly cough; symptoms x 3 weeks; is prone to recurrent episodes like this; has found codeine cough syrup to be beneficial with symptoms in the past; his PCP actually called in the codeine cough syrup for him this morning; normal CXR in 11/2018; denies any shortness of breath or difficulty breathing; did recently return from vacation in [place]- felt that he got too hot during the vacation/ was fishing and thinks this was the start of his symptoms. Is fully vaccinated against COVID;     Objective:  There were no vitals filed for this visit.  General: Well developed, well nourished, in no acute distress  Skin : Warm and dry.  Head: Normocephalic and atraumatic  Lungs: Respirations unlabored;  Neurologic: Alert and oriented; speech intact; face symmetrical;   Assessment:  1. Cough     Plan:  This is  a recurrent issue for the patient- his PCP has already called in codeine cough syrup for him; will add Z-pak; if symptoms persist, to consider COVID testing and CXR;  No follow-ups on file.  No orders of the defined types were placed in this encounter.   Requested Prescriptions   Signed Prescriptions Disp Refills  . azithromycin (ZITHROMAX) 250 MG tablet 6 tablet 0    Sig: 2 tabs po qd x 1 day; 1 tablet per day x 4 days;

## 2020-06-20 ENCOUNTER — Encounter: Payer: Self-pay | Admitting: Cardiovascular Disease

## 2020-06-20 ENCOUNTER — Ambulatory Visit (INDEPENDENT_AMBULATORY_CARE_PROVIDER_SITE_OTHER): Payer: Medicare Other | Admitting: Cardiovascular Disease

## 2020-06-20 ENCOUNTER — Other Ambulatory Visit: Payer: Self-pay

## 2020-06-20 VITALS — BP 122/80 | HR 42 | Ht 73.0 in | Wt 190.6 lb

## 2020-06-20 DIAGNOSIS — Q211 Atrial septal defect: Secondary | ICD-10-CM

## 2020-06-20 DIAGNOSIS — Q2112 Patent foramen ovale: Secondary | ICD-10-CM

## 2020-06-20 DIAGNOSIS — E782 Mixed hyperlipidemia: Secondary | ICD-10-CM

## 2020-06-20 DIAGNOSIS — I2510 Atherosclerotic heart disease of native coronary artery without angina pectoris: Secondary | ICD-10-CM | POA: Diagnosis not present

## 2020-06-20 DIAGNOSIS — Z23 Encounter for immunization: Secondary | ICD-10-CM | POA: Diagnosis not present

## 2020-06-20 NOTE — Patient Instructions (Signed)

## 2020-06-20 NOTE — Progress Notes (Signed)
Cardiology Office Note:    Date:  06/20/2020   ID:  SEIF TEICHERT, DOB 1946-02-03, MRN [id number]  PCP:  Cassandria Anger, MD  [facility] Cardiologist:  [name], MD  [place] Electrophysiologist:  None   Referring MD: Cassandria Anger, MD   Chief Complaint  Patient presents with  . Coronary Artery Disease    History of Present Illness:    Richard Ellis is a 74 y.o. male with a hx of:  Coronary artery disease ? Cath 2/17: Severe mid LAD stenosis >> DES to the mid LAD ? Status post CABG 2018 (LIMA-LAD, SVG-D1)  Patent foramen ovale status post surgical closure  Prior CVA ? Cryptogenic CVA 2014 >> PFO with R>L shunt noted; treated with Plavix  Hyperlipidemia ? Intol of statins, PCSK9i  >> Diet Rx  Hypothyroidism  Anemia  Bradycardia -no beta-blocker  He was last seen here in August 2020 by Richardson Dopp.  He has been intolerant to all lipid-lowering therapies.  He continues to have some limitation from low back pain.  He is still able to do some running, doing a 5 mile run last week without any cardiovascular symptoms.  States that he does not have the energy he used to, otherwise no specific complaints.  He does have some discomfort around his sternotomy site which has been chronic.  He has had no exertional chest pain or pressure, dyspnea, edema, or heart palpitations.  Past Medical History:  Diagnosis Date  . Allergy    rhinitis  . Anemia    "Hgb always on the low side" (07/02/2013)  . CAD (coronary artery disease)    a. LHC 2/17: pLAD 50, mLAD 95, pRCA 30, dRCA 25, EF 50-55% >> PCI:  3 x 18 mm Resolute DES to mLAD   . Chronic bronchitis ([place])    "not in the last year since I started taking allergy shots" (10/09/2016)  . Chronic lower back pain   . Chronic sinus bradycardia 07/02/2013  . Coronary artery disease 10/15/2016  . Cryptogenic stroke ([place]) 07/02/2013   a. "small ischemic occipital right sided" (07/02/2013)  //  b. Event monitor  10/14: sinus brady  //  c. Carotid US 10/14: bilat ICA 1-39%  . Esophageal stricture   . Exertional angina ([place]) 10/09/2016  . Heart murmur    "as a child" (07/02/2013)  . History of echocardiogram    a. Echo 10/14: EF 60-65%, no RWMA, normal diastolic function  . Hyperlipidemia   . Hypothyroidism   . Nonallopathic lesion of lumbosacral region 06/04/2017  . Nonallopathic lesion of sacral region 06/04/2017  . Nonallopathic lesion of thoracic region 06/04/2017  . Osteoarthritis    "lower back" (10/09/2016)  . PFO (patent foramen ovale)    a. TEE 11/14: mild LVH, EF 60-65%, small PFO, R-L shunt    Past Surgical History:  Procedure Laterality Date  . CARDIAC CATHETERIZATION N/A 11/10/2015   Procedure: Left Heart Cath and Coronary Angiography;  Surgeon: [name], MD;  Location: [place] [facility];  Service: Cardiovascular;  Laterality: N/A;  . CARDIAC CATHETERIZATION N/A 11/10/2015   Procedure: Coronary Stent Intervention;  Surgeon: [name], MD;  Location: [place] [facility];  Service: Cardiovascular;  Laterality: N/A;  mid lad  3.0x18 resolute  . CARDIAC CATHETERIZATION  10/09/2016   "scheduled OHS for tomorrow" (10/09/2016)  . CARDIAC CATHETERIZATION N/A 10/09/2016   Procedure: Left Heart Cath and Coronary Angiography;  Surgeon: [name], MD;  Location: [place] [facility];  Service: Cardiovascular;  Laterality: N/A;  . CARDIAC CATHETERIZATION N/A 10/09/2016   Procedure: Coronary Balloon Angioplasty;  Surgeon: [name], MD;  Location: [place] [facility];  Service: Cardiovascular;  Laterality: N/A;  . CORONARY ARTERY BYPASS GRAFT N/A 10/15/2016   Procedure: CORONARY ARTERY BYPASS GRAFTING (CABG), ON PUMP, TIMES TWO, USING LEFT INTERNAL MAMMARY ARTERY AND RIGHT GREATER SAPHENOUS VEIN HARVESTED ENDOSCOPICALLY;  Surgeon: Ivin Poot, MD;  Location: [place];  Service: Open Heart Surgery;  Laterality: N/A;  LIMA to LAD, SVG to DIAGONAL  . ESOPHAGOGASTRODUODENOSCOPY N/A 10/26/2012     Procedure: ESOPHAGOGASTRODUODENOSCOPY (EGD);  Surgeon: Winfield Cunas., MD;  Location[facility];  Service: Endoscopy;  Laterality: N/A;  . ESOPHAGOGASTRODUODENOSCOPY N/A 07/08/2013   Procedure: ESOPHAGOGASTRODUODENOSCOPY (EGD);  Surgeon: Winfield Cunas., MD;  Location: [facility];  Service: Endoscopy;  Laterality: N/A;  . INNER EAR SURGERY Left 1957   "related to ear infection"  . REPAIR OF PATENT FORAMEN OVALE N/A 10/15/2016   Procedure: REPAIR OF PATENT FORAMEN OVALE;  Surgeon: Ivin Poot, MD;  Location: [place];  Service: Open Heart Surgery;  Laterality: N/A;  . SAVORY DILATION N/A 07/08/2013   Procedure: SAVORY DILATION;  Surgeon: Winfield Cunas., MD;  Location: [facility];  Service: Endoscopy;  Laterality: N/A;  . TEE WITHOUT CARDIOVERSION N/A 07/28/2013   Procedure: TRANSESOPHAGEAL ECHOCARDIOGRAM (TEE);  Surgeon: Pixie Casino, MD;  Location: [facility] ENDOSCOPY;  Service: Cardiovascular;  Laterality: N/A;  . TEE WITHOUT CARDIOVERSION N/A 10/15/2016   Procedure: TRANSESOPHAGEAL ECHOCARDIOGRAM (TEE);  Surgeon: Ivin Poot, MD;  Location: [place];  Service: Open Heart Surgery;  Laterality: N/A;  . TONSILLECTOMY  1950's    Current Medications: Current Meds  Medication Sig  . aspirin EC 81 MG tablet Take 1 tablet (81 mg total) by mouth daily.  . Cholecalciferol 2000 UNITS CAPS Take 2,000 Units by mouth every evening.   . Ferrous Sulfate (IRON) 325 (65 Fe) MG TABS Take 1 tablet by mouth daily.  [name] levothyroxine (SYNTHROID, LEVOTHROID) 25 MCG tablet 1 tab a day, except for mon-wed-fri 2 tabs a day  . Omega-3 Fatty Acids (FISH OIL) 1200 MG CAPS Take 1,200 mg by mouth daily.   . promethazine-codeine (PHENERGAN WITH CODEINE) 6.25-10 MG/5ML syrup Take 5 mLs by mouth every 4 (four) hours as needed.  . Turmeric 500 MG CAPS Take 1 capsule by mouth at bedtime.     Allergies:   Atorvastatin, Crestor [rosuvastatin], Demerol [meperidine], Niacin and related, and Repatha  [evolocumab]   Social History   Socioeconomic History  . Marital status: Married    Spouse name: Not on file  . Number of children: 1  . Years of education: Not on file  . Highest education level: Not on file  Occupational History  . Occupation: Retired  Tobacco Use  . Smoking status: Never Smoker  . Smokeless tobacco: Never Used  Vaping Use  . Vaping Use: Never used  Substance and Sexual Activity  . Alcohol use: Yes    Alcohol/week: 8.0 standard drinks    Types: 4 Glasses of wine, 4 Cans of beer per week    Comment: weekly  . Drug use: No  . Sexual activity: Yes  Other Topics Concern  . Not on file  Social History Narrative  . Not on file   Social Determinants of Health   Financial Resource Strain:   . Difficulty of Paying Living Expenses: Not on file  Food Insecurity:   . Worried About  Running Out of Food in the Last Year: Not on file  . Ran Out of Food in the Last Year: Not on file  Transportation Needs:   . Lack of Transportation (Medical): Not on file  . Lack of Transportation (Non-Medical): Not on file  Physical Activity:   . Days of Exercise per Week: Not on file  . Minutes of Exercise per Session: Not on file  Stress:   . Feeling of Stress : Not on file  Social Connections:   . Frequency of Communication with Friends and Family: Not on file  . Frequency of Social Gatherings with Friends and Family: Not on file  . Attends Religious Services: Not on file  . Active Member of Clubs or Organizations: Not on file  . Attends Archivist Meetings: Not on file  . Marital Status: Not on file     Family History: The patient's family history includes Coronary artery disease in an other family member; Heart disease in his father. There is no history of Colon cancer.  ROS:   Please see the history of present illness.    All other systems reviewed and are negative.  EKGs/Labs/Other Studies Reviewed:    EKG:  EKG is ordered today.  The ekg ordered today  demonstrates marked sinus bradycardia 42 bpm, otherwise normal  Recent Labs: No results found for requested labs within last 8760 hours.  Recent Lipid Panel    Component Value Date/Time   CHOL 122 (L) 04/18/2016 0816   CHOL 230 (H) 11/09/2014 1200   TRIG 115 04/18/2016 0816   TRIG 152 (H) 11/09/2014 1200   TRIG 228 (HH) 07/30/2006 0741   HDL 52 04/18/2016 0816   HDL 64 11/09/2014 1200   CHOLHDL 2.3 04/18/2016 0816   VLDL 23 04/18/2016 0816   LDLCALC 47 04/18/2016 0816   LDLCALC 136 (H) 11/09/2014 1200   LDLDIRECT 122.0 05/09/2015 1006    Physical Exam:    VS:  BP 122/80   Pulse (!) 42   Ht 6\' 1"  (1.854 m)   Wt 190 lb 9.6 oz (86.5 kg)   SpO2 98%   BMI 25.15 kg/m     Wt Readings from Last 3 Encounters:  06/20/20 190 lb 9.6 oz (86.5 kg)  04/21/19 183 lb (83 kg)  12/02/18 187 lb (84.8 kg)     GEN:  Well nourished, well developed in no acute distress HEENT: Normal NECK: No JVD; No carotid bruits LYMPHATICS: No lymphadenopathy CARDIAC: Bradycardic and regular, no murmurs, rubs, gallops RESPIRATORY:  Clear to auscultation without rales, wheezing or rhonchi  ABDOMEN: Soft, non-tender, non-distended MUSCULOSKELETAL:  No edema; No deformity  SKIN: Warm and dry NEUROLOGIC:  Alert and oriented x 3 PSYCHIATRIC:  Normal affect   ASSESSMENT:    1. Coronary artery disease involving native coronary artery of native heart without angina pectoris   2. Patent foramen ovale   3. Mixed hyperlipidemia    PLAN:    In order of problems listed above:  1. Stable without angina.  Continue aspirin.  Heart rate will not allow for a beta-blocker.  Patient is normotensive.  He is intolerant to all lipid-lowering therapies with myalgias and weakness occurring with statins as well as PCSK9 inhibitors. 2. Closed at the time of surgery 3. Continue with lifestyle modification   Medication Adjustments/Labs and Tests Ordered: Current medicines are reviewed at length with the patient today.   Concerns regarding medicines are outlined above.  Orders Placed This Encounter  Procedures  . EKG  12-Lead   No orders of the defined types were placed in this encounter.   Patient Instructions  Medication Instructions:  Your provider recommends that you continue on your current medications as directed. Please refer to the Current Medication list given to you today.   *If you need a refill on your cardiac medications before your next appointment, please call your pharmacy*  Follow-Up: At [facility], you and your health needs are our priority.  As part of our continuing mission to provide you with exceptional heart care, we have created designated Provider Care Teams.  These Care Teams include your primary Cardiologist (physician) and Advanced Practice Providers (APPs -  Physician Assistants and Nurse Practitioners) who all work together to provide you with the care you need, when you need it. Your next appointment:   12 month(s) The format for your next appointment:   In Person Provider:   You may see [name], MD or one of the following Advanced Practice Providers on your designated Care Team:    Richardson Dopp, PA-C  Robbie Lis, [place]      Signed, [name], MD  06/20/2020 9:00 AM    [place]

## 2020-06-29 ENCOUNTER — Other Ambulatory Visit: Payer: Self-pay

## 2020-06-29 ENCOUNTER — Ambulatory Visit (INDEPENDENT_AMBULATORY_CARE_PROVIDER_SITE_OTHER): Payer: Medicare Other

## 2020-06-29 ENCOUNTER — Ambulatory Visit: Payer: Self-pay

## 2020-06-29 ENCOUNTER — Encounter: Payer: Self-pay | Admitting: Family Medicine

## 2020-06-29 ENCOUNTER — Ambulatory Visit (INDEPENDENT_AMBULATORY_CARE_PROVIDER_SITE_OTHER): Payer: Medicare Other | Admitting: Family Medicine

## 2020-06-29 VITALS — BP 128/84 | HR 53 | Ht 73.0 in | Wt 194.0 lb

## 2020-06-29 DIAGNOSIS — I2510 Atherosclerotic heart disease of native coronary artery without angina pectoris: Secondary | ICD-10-CM

## 2020-06-29 DIAGNOSIS — M75111 Incomplete rotator cuff tear or rupture of right shoulder, not specified as traumatic: Secondary | ICD-10-CM | POA: Diagnosis not present

## 2020-06-29 DIAGNOSIS — M75100 Unspecified rotator cuff tear or rupture of unspecified shoulder, not specified as traumatic: Secondary | ICD-10-CM | POA: Insufficient documentation

## 2020-06-29 DIAGNOSIS — M19011 Primary osteoarthritis, right shoulder: Secondary | ICD-10-CM | POA: Diagnosis not present

## 2020-06-29 DIAGNOSIS — M25511 Pain in right shoulder: Secondary | ICD-10-CM

## 2020-06-29 NOTE — Assessment & Plan Note (Signed)
Patient does have what appears to be more of a degenerative rotator cuff tear.  Hoping that most of the pain now is secondary to more of the reactive bursitis and injection given today, home exercises given, discussed icing regimen and home exercises that I would also avoid.  Patient will follow up with me again in 4 to 8 weeks

## 2020-06-29 NOTE — Progress Notes (Signed)
[place] Phone: [phone] Subjective:   [name], am serving as a scribe for Dr. Hulan Saas. This visit occurred during the SARS-CoV-2 public health emergency.  Safety protocols were in place, including screening questions prior to the visit, additional usage of staff PPE, and extensive cleaning of exam room while observing appropriate contact time as indicated for disinfecting solutions.   I'm seeing this patient by the request  of:  Plotnikov, Evie Lacks, MD  CC: Right shoulder pain  QQI:[id number]   09/24/2018 Much improved at this time.  Responding well to the injections.  Discussed icing regimen and home exercises.  Discussed which activities to doing which was to avoid.  Patient is to increase activity slowly over the course the next several weeks.  Patient is going to follow-up with Dr. Maryjean Ka to discuss other injections.  Follow-up with me again in 8 weeks   Update 06/29/2020 Richard Ellis is a 74 y.o. male coming in with complaint of right shoulder pain for past 20 years. Patient was doing pec fly and injured shoulder and has had pain since. Throwing and chest press cause pain. Patient also wakes up at night intermittently if he sleeps on shoulder. Denies any radiating. Patient has been using Pennsaid for pain.  Rates the severity of pain is 5 out of 10.       Past Medical History:  Diagnosis Date  . Allergy    rhinitis  . Anemia    "Hgb always on the low side" (07/02/2013)  . CAD (coronary artery disease)    a. LHC 2/17: pLAD 50, mLAD 95, pRCA 30, dRCA 25, EF 50-55% >> PCI:  3 x 18 mm Resolute DES to mLAD   . Chronic bronchitis ([place])    "not in the last year since I started taking allergy shots" (10/09/2016)  . Chronic lower back pain   . Chronic sinus bradycardia 07/02/2013  . Coronary artery disease 10/15/2016  . Cryptogenic stroke ([place]) 07/02/2013   a. "small ischemic occipital right  sided" (07/02/2013)  //  b. Event monitor 10/14: sinus brady  //  c. Carotid US 10/14: bilat ICA 1-39%  . Esophageal stricture   . Exertional angina ([place]) 10/09/2016  . Heart murmur    "as a child" (07/02/2013)  . History of echocardiogram    a. Echo 10/14: EF 60-65%, no RWMA, normal diastolic function  . Hyperlipidemia   . Hypothyroidism   . Nonallopathic lesion of lumbosacral region 06/04/2017  . Nonallopathic lesion of sacral region 06/04/2017  . Nonallopathic lesion of thoracic region 06/04/2017  . Osteoarthritis    "lower back" (10/09/2016)  . PFO (patent foramen ovale)    a. TEE 11/14: mild LVH, EF 60-65%, small PFO, R-L shunt   Past Surgical History:  Procedure Laterality Date  . CARDIAC CATHETERIZATION N/A 11/10/2015   Procedure: Left Heart Cath and Coronary Angiography;  Surgeon: [name], MD;  Location: [place] [facility];  Service: Cardiovascular;  Laterality: N/A;  . CARDIAC CATHETERIZATION N/A 11/10/2015   Procedure: Coronary Stent Intervention;  Surgeon: [name], MD;  Location: [place] [facility];  Service: Cardiovascular;  Laterality: N/A;  mid lad  3.0x18 resolute  . CARDIAC CATHETERIZATION  10/09/2016   "scheduled OHS for tomorrow" (10/09/2016)  . CARDIAC CATHETERIZATION N/A 10/09/2016   Procedure: Left Heart Cath and Coronary Angiography;  Surgeon: [name], MD;  Location: [place] [facility];  Service: Cardiovascular;  Laterality: N/A;  .  CARDIAC CATHETERIZATION N/A 10/09/2016   Procedure: Coronary Balloon Angioplasty;  Surgeon: [name], MD;  Location: [place] [facility];  Service: Cardiovascular;  Laterality: N/A;  . CORONARY ARTERY BYPASS GRAFT N/A 10/15/2016   Procedure: CORONARY ARTERY BYPASS GRAFTING (CABG), ON PUMP, TIMES TWO, USING LEFT INTERNAL MAMMARY ARTERY AND RIGHT GREATER SAPHENOUS VEIN HARVESTED ENDOSCOPICALLY;  Surgeon: Ivin Poot, MD;  Location: [place];  Service: Open Heart Surgery;  Laterality: N/A;  LIMA to LAD, SVG to DIAGONAL  .  ESOPHAGOGASTRODUODENOSCOPY N/A 10/26/2012   Procedure: ESOPHAGOGASTRODUODENOSCOPY (EGD);  Surgeon: Winfield Cunas., MD;  Location: [facility]OSCOPY;  Service: Endoscopy;  Laterality: N/A;  . ESOPHAGOGASTRODUODENOSCOPY N/A 07/08/2013   Procedure: ESOPHAGOGASTRODUODENOSCOPY (EGD);  Surgeon: Winfield Cunas., MD;  Location: [facility];  Service: Endoscopy;  Laterality: N/A;  . INNER EAR SURGERY Left 1957   "related to ear infection"  . REPAIR OF PATENT FORAMEN OVALE N/A 10/15/2016   Procedure: REPAIR OF PATENT FORAMEN OVALE;  Surgeon: Ivin Poot, MD;  Location: [place];  Service: Open Heart Surgery;  Laterality: N/A;  . SAVORY DILATION N/A 07/08/2013   Procedure: SAVORY DILATION;  Surgeon: Winfield Cunas., MD;  Location: [facility];  Service: Endoscopy;  Laterality: N/A;  . TEE WITHOUT CARDIOVERSION N/A 07/28/2013   Procedure: TRANSESOPHAGEAL ECHOCARDIOGRAM (TEE);  Surgeon: Pixie Casino, MD;  Location: [facility] ENDOSCOPY;  Service: Cardiovascular;  Laterality: N/A;  . TEE WITHOUT CARDIOVERSION N/A 10/15/2016   Procedure: TRANSESOPHAGEAL ECHOCARDIOGRAM (TEE);  Surgeon: Ivin Poot, MD;  Location: [place];  Service: Open Heart Surgery;  Laterality: N/A;  . TONSILLECTOMY  1950's   Social History   Socioeconomic History  . Marital status: Married    Spouse name: Not on file  . Number of children: 1  . Years of education: Not on file  . Highest education level: Not on file  Occupational History  . Occupation: Retired  Tobacco Use  . Smoking status: Never Smoker  . Smokeless tobacco: Never Used  Vaping Use  . Vaping Use: Never used  Substance and Sexual Activity  . Alcohol use: Yes    Alcohol/week: 8.0 standard drinks    Types: 4 Glasses of wine, 4 Cans of beer per week    Comment: weekly  . Drug use: No  . Sexual activity: Yes  Other Topics Concern  . Not on file  Social History Narrative  . Not on file   Social Determinants of Health   Financial Resource Strain:   .  Difficulty of Paying Living Expenses: Not on file  Food Insecurity:   . Worried About Charity fundraiser in the Last Year: Not on file  . Ran Out of Food in the Last Year: Not on file  Transportation Needs:   . Lack of Transportation (Medical): Not on file  . Lack of Transportation (Non-Medical): Not on file  Physical Activity:   . Days of Exercise per Week: Not on file  . Minutes of Exercise per Session: Not on file  Stress:   . Feeling of Stress : Not on file  Social Connections:   . Frequency of Communication with Friends and Family: Not on file  . Frequency of Social Gatherings with Friends and Family: Not on file  . Attends Religious Services: Not on file  . Active Member of Clubs or Organizations: Not on file  . Attends Archivist Meetings: Not on file  . Marital Status: Not on file   Allergies  Allergen Reactions  . Atorvastatin Other (See Comments)    Muscle weakness  . Crestor [Rosuvastatin] Other (See Comments)    Muscle weakness  . Demerol [Meperidine] Nausea Only  . Niacin And Related Itching, Rash and Other (See Comments)    Palms turned red and itched  . Repatha [Evolocumab] Other (See Comments)    Muscle weakness   Family History  Problem Relation Age of Onset  . Coronary artery disease Other        male first degree relative <60  . Heart disease Father        heart attack  . Colon cancer Neg Hx     Current Outpatient Medications (Endocrine & Metabolic):  .  levothyroxine (SYNTHROID, LEVOTHROID) 25 MCG tablet, 1 tab a day, except for mon-wed-fri 2 tabs a day   Current Outpatient Medications (Respiratory):  .  promethazine-codeine (PHENERGAN WITH CODEINE) 6.25-10 MG/5ML syrup, Take 5 mLs by mouth every 4 (four) hours as needed.  Current Outpatient Medications (Analgesics):  .  aspirin EC 81 MG tablet, Take 1 tablet (81 mg total) by mouth daily.  Current Outpatient Medications (Hematological):  [name]  Ferrous Sulfate (IRON) 325 (65 Fe) MG  TABS, Take 1 tablet by mouth daily.  Current Outpatient Medications (Other):  [name]  Cholecalciferol 2000 UNITS CAPS, Take 2,000 Units by mouth every evening.  .  Omega-3 Fatty Acids (FISH OIL) 1200 MG CAPS, Take 1,200 mg by mouth daily.  .  Turmeric 500 MG CAPS, Take 1 capsule by mouth at bedtime.   Reviewed prior external information including notes and imaging from  primary care provider As well as notes that were available from care everywhere and other healthcare systems.  Past medical history, social, surgical and family history all reviewed in electronic medical record.  No pertanent information unless stated regarding to the chief complaint.   Review of Systems:  No headache, visual changes, nausea, vomiting, diarrhea, constipation, dizziness, abdominal pain, skin rash, fevers, chills, night sweats, weight loss, swollen lymph nodes, body aches, joint swelling, chest pain, shortness of breath, mood changes. POSITIVE muscle aches  Objective  Blood pressure 128/84, pulse (!) 53, height 6\' 1"  (1.854 m), weight 194 lb (88 kg), SpO2 98 %.   General: No apparent distress alert and oriented x3 mood and affect normal, dressed appropriately.  HEENT: Pupils equal, extraocular movements intact  Respiratory: Patient's speak in full sentences and does not appear short of breath  Cardiovascular: No lower extremity edema, non tender, no erythema  Neuro: Cranial nerves II through XII are intact, neurovascularly intact in all extremities with 2+ DTRs and 2+ pulses.  Gait normal with good balance and coordination.  MSK: Right shoulder exam shows the patient does have 4+ out of 5 strength of the rotator cuff which is minorly weak compared to the contralateral side.  Positive impingement with Neer and Hawkins noted.  Positive crossover test noted but negative O'Brien's.  Negative speeds  Limited musculoskeletal ultrasound was performed and interpreted by Lyndal Pulley  Extremity ultrasound of patient's  right shoulder shows that patient does have some mild degenerative tearing that seems to be chronic of the subscapularis and supraspinatus noted.  No significant retraction noted may be mildly of the supraspinatus.  Reactive subacromial bursitis noted.  Patient also has what appears to be moderate acromioclavicular arthritis with very small effusion noted. Impression: Chronic degenerative tearing of the rotator cuff with reactive subacromial bursitis acromioclavicular arthritis  Procedure: Real-time Ultrasound Guided Injection of right glenohumeral joint Device:  GE Logiq Q7  Ultrasound guided injection is preferred based studies that show increased duration, increased effect, greater accuracy, decreased procedural pain, increased response rate with ultrasound guided versus blind injection.  Verbal informed consent obtained.  Time-out conducted.  Noted no overlying erythema, induration, or other signs of local infection.  Skin prepped in a sterile fashion.  Local anesthesia: Topical Ethyl chloride.  With sterile technique and under real time ultrasound guidance:  Joint visualized.  23g 1  inch needle inserted posterior approach. Pictures taken for needle placement. Patient did have injection of 2 cc of 1% lidocaine, 2 cc of 0.5% Marcaine, and 1.0 cc of Kenalog 40 mg/dL. Completed without difficulty  Pain immediately resolved suggesting accurate placement of the medication.  Advised to call if fevers/chills, erythema, induration, drainage, or persistent bleeding.  Impression: Technically successful ultrasound guided injection.   Impression and Recommendations:     The above documentation has been reviewed and is accurate and complete Lyndal Pulley, DO

## 2020-06-29 NOTE — Patient Instructions (Signed)
Xray Injection Exercises See me again in 5-6 weeks

## 2020-08-02 NOTE — Progress Notes (Deleted)
[place] Phone: [phone] Subjective:    I'm seeing this patient by the request  of:  Plotnikov, Evie Lacks, MD  CC: Right shoulder pain follow-up  [id number]   06/29/2020 Patient does have what appears to be more of a degenerative rotator cuff tear.  Hoping that most of the pain now is secondary to more of the reactive bursitis and injection given today, home exercises given, discussed icing regimen and home exercises that I would also avoid.  Patient will follow up with me again in 4 to 8 weeks  Update 08/03/2020 DANYEL GRIESS is a 74 y.o. male coming in with complaint of right shoulder pain.  Found to have more of a reactive bursitis with degenerative degenerative rotator cuff tear.  Given an injection and home exercises at last follow-up.  Patient states     Patient did have x-rays at last exam.  X-rays taken on July 01, 2020 were independently visualized by me showing the patient did have moderate osteoarthritic changes in the glenohumeral as well as the acromioclavicular joint  Past Medical History:  Diagnosis Date  . Allergy    rhinitis  . Anemia    "Hgb always on the low side" (07/02/2013)  . CAD (coronary artery disease)    a. LHC 2/17: pLAD 50, mLAD 95, pRCA 30, dRCA 25, EF 50-55% >> PCI:  3 x 18 mm Resolute DES to mLAD   . Chronic bronchitis ([place])    "not in the last year since I started taking allergy shots" (10/09/2016)  . Chronic lower back pain   . Chronic sinus bradycardia 07/02/2013  . Coronary artery disease 10/15/2016  . Cryptogenic stroke ([place]) 07/02/2013   a. "small ischemic occipital right sided" (07/02/2013)  //  b. Event monitor 10/14: sinus brady  //  c. Carotid US 10/14: bilat ICA 1-39%  . Esophageal stricture   . Exertional angina ([place]) 10/09/2016  . Heart murmur    "as a child" (07/02/2013)  . History of echocardiogram    a. Echo 10/14: EF 60-65%, no RWMA, normal diastolic  function  . Hyperlipidemia   . Hypothyroidism   . Nonallopathic lesion of lumbosacral region 06/04/2017  . Nonallopathic lesion of sacral region 06/04/2017  . Nonallopathic lesion of thoracic region 06/04/2017  . Osteoarthritis    "lower back" (10/09/2016)  . PFO (patent foramen ovale)    a. TEE 11/14: mild LVH, EF 60-65%, small PFO, R-L shunt   Past Surgical History:  Procedure Laterality Date  . CARDIAC CATHETERIZATION N/A 11/10/2015   Procedure: Left Heart Cath and Coronary Angiography;  Surgeon: [name], MD;  Location: [place] [facility];  Service: Cardiovascular;  Laterality: N/A;  . CARDIAC CATHETERIZATION N/A 11/10/2015   Procedure: Coronary Stent Intervention;  Surgeon: [name], MD;  Location: [place] [facility];  Service: Cardiovascular;  Laterality: N/A;  mid lad  3.0x18 resolute  . CARDIAC CATHETERIZATION  10/09/2016   "scheduled OHS for tomorrow" (10/09/2016)  . CARDIAC CATHETERIZATION N/A 10/09/2016   Procedure: Left Heart Cath and Coronary Angiography;  Surgeon: [name], MD;  Location: [place] [facility];  Service: Cardiovascular;  Laterality: N/A;  . CARDIAC CATHETERIZATION N/A 10/09/2016   Procedure: Coronary Balloon Angioplasty;  Surgeon: [name], MD;  Location: [place] [facility];  Service: Cardiovascular;  Laterality: N/A;  . CORONARY ARTERY BYPASS GRAFT N/A 10/15/2016   Procedure: CORONARY ARTERY BYPASS GRAFTING (CABG), ON PUMP, TIMES TWO, USING LEFT  INTERNAL MAMMARY ARTERY AND RIGHT GREATER SAPHENOUS VEIN HARVESTED ENDOSCOPICALLY;  Surgeon: Ivin Poot, MD;  Location: [place];  Service: Open Heart Surgery;  Laterality: N/A;  LIMA to LAD, SVG to DIAGONAL  . ESOPHAGOGASTRODUODENOSCOPY N/A 10/26/2012   Procedure: ESOPHAGOGASTRODUODENOSCOPY (EGD);  Surgeon: Winfield Cunas., MD;  [facility] ENDOS[facility];  Service: Endoscopy;  Laterality: N/A;  . ESOPHAGOGASTRODUODENOSCOPY N/A 07/08/2013   Procedure: ESOPHAGOGASTRODUODENOSCOPY (EGD);  Surgeon: Winfield Cunas., MD;  Location: [facility];  Service: Endoscopy;  Laterality: N/A;  . INNER EAR SURGERY Left 1957   "related to ear infection"  . REPAIR OF PATENT FORAMEN OVALE N/A 10/15/2016   Procedure: REPAIR OF PATENT FORAMEN OVALE;  Surgeon: Ivin Poot, MD;  Location: [place];  Service: Open Heart Surgery;  Laterality: N/A;  . SAVORY DILATION N/A 07/08/2013   Procedure: SAVORY DILATION;  Surgeon: Winfield Cunas., MD;  Location: [facility];  Service: Endoscopy;  Laterality: N/A;  . TEE WITHOUT CARDIOVERSION N/A 07/28/2013   Procedure: TRANSESOPHAGEAL ECHOCARDIOGRAM (TEE);  Surgeon: Pixie Casino, MD;  Location: [facility] ENDOSCOPY;  Service: Cardiovascular;  Laterality: N/A;  . TEE WITHOUT CARDIOVERSION N/A 10/15/2016   Procedure: TRANSESOPHAGEAL ECHOCARDIOGRAM (TEE);  Surgeon: Ivin Poot, MD;  Location: [place];  Service: Open Heart Surgery;  Laterality: N/A;  . TONSILLECTOMY  1950's   Social History   Socioeconomic History  . Marital status: Married    Spouse name: Not on file  . Number of children: 1  . Years of education: Not on file  . Highest education level: Not on file  Occupational History  . Occupation: Retired  Tobacco Use  . Smoking status: Never Smoker  . Smokeless tobacco: Never Used  Vaping Use  . Vaping Use: Never used  Substance and Sexual Activity  . Alcohol use: Yes    Alcohol/week: 8.0 standard drinks    Types: 4 Glasses of wine, 4 Cans of beer per week    Comment: weekly  . Drug use: No  . Sexual activity: Yes  Other Topics Concern  . Not on file  Social History Narrative  . Not on file   Social Determinants of Health   Financial Resource Strain:   . Difficulty of Paying Living Expenses: Not on file  Food Insecurity:   . Worried About Charity fundraiser in the Last Year: Not on file  . Ran Out of Food in the Last Year: Not on file  Transportation Needs:   . Lack of Transportation (Medical): Not on file  . Lack of Transportation  (Non-Medical): Not on file  Physical Activity:   . Days of Exercise per Week: Not on file  . Minutes of Exercise per Session: Not on file  Stress:   . Feeling of Stress : Not on file  Social Connections:   . Frequency of Communication with Friends and Family: Not on file  . Frequency of Social Gatherings with Friends and Family: Not on file  . Attends Religious Services: Not on file  . Active Member of Clubs or Organizations: Not on file  . Attends Archivist Meetings: Not on file  . Marital Status: Not on file   Allergies  Allergen Reactions  . Atorvastatin Other (See Comments)    Muscle weakness  . Crestor [Rosuvastatin] Other (See Comments)    Muscle weakness  . Demerol [Meperidine] Nausea Only  . Niacin And Related Itching, Rash and Other (See Comments)    Palms turned red and  itched  . Repatha [Evolocumab] Other (See Comments)    Muscle weakness   Family History  Problem Relation Age of Onset  . Coronary artery disease Other        male first degree relative <60  . Heart disease Father        heart attack  . Colon cancer Neg Hx     Current Outpatient Medications (Endocrine & Metabolic):  .  levothyroxine (SYNTHROID, LEVOTHROID) 25 MCG tablet, 1 tab a day, except for mon-wed-fri 2 tabs a day   Current Outpatient Medications (Respiratory):  .  promethazine-codeine (PHENERGAN WITH CODEINE) 6.25-10 MG/5ML syrup, Take 5 mLs by mouth every 4 (four) hours as needed.  Current Outpatient Medications (Analgesics):  .  aspirin EC 81 MG tablet, Take 1 tablet (81 mg total) by mouth daily.  Current Outpatient Medications (Hematological):  [name]  Ferrous Sulfate (IRON) 325 (65 Fe) MG TABS, Take 1 tablet by mouth daily.  Current Outpatient Medications (Other):  [name]  Cholecalciferol 2000 UNITS CAPS, Take 2,000 Units by mouth every evening.  .  Omega-3 Fatty Acids (FISH OIL) 1200 MG CAPS, Take 1,200 mg by mouth daily.  .  Turmeric 500 MG CAPS, Take 1 capsule by mouth at  bedtime.   Reviewed prior external information including notes and imaging from  primary care provider As well as notes that were available from care everywhere and other healthcare systems.  Past medical history, social, surgical and family history all reviewed in electronic medical record.  No pertanent information unless stated regarding to the chief complaint.   Review of Systems:  No headache, visual changes, nausea, vomiting, diarrhea, constipation, dizziness, abdominal pain, skin rash, fevers, chills, night sweats, weight loss, swollen lymph nodes, body aches, joint swelling, chest pain, shortness of breath, mood changes. POSITIVE muscle aches  Objective  There were no vitals taken for this visit.   General: No apparent distress alert and oriented x3 mood and affect normal, dressed appropriately.  HEENT: Pupils equal, extraocular movements intact  Respiratory: Patient's speak in full sentences and does not appear short of breath  Cardiovascular: No lower extremity edema, non tender, no erythema  Neuro: Cranial nerves II through XII are intact, neurovascularly intact in all extremities with 2+ DTRs and 2+ pulses.  Gait normal with good balance and coordination.  MSK:  Non tender with full range of motion and good stability and symmetric strength and tone of shoulders, elbows, wrist, hip, knee and ankles bilaterally.     Impression and Recommendations:     The above documentation has been reviewed and is accurate and complete [name]

## 2020-08-03 ENCOUNTER — Other Ambulatory Visit: Payer: Self-pay

## 2020-08-03 ENCOUNTER — Ambulatory Visit: Payer: Medicare Other | Admitting: Family Medicine

## 2020-08-03 ENCOUNTER — Ambulatory Visit (INDEPENDENT_AMBULATORY_CARE_PROVIDER_SITE_OTHER): Payer: Medicare Other | Admitting: Family Medicine

## 2020-08-03 ENCOUNTER — Encounter: Payer: Self-pay | Admitting: Family Medicine

## 2020-08-03 DIAGNOSIS — I2510 Atherosclerotic heart disease of native coronary artery without angina pectoris: Secondary | ICD-10-CM | POA: Diagnosis not present

## 2020-08-03 DIAGNOSIS — G8929 Other chronic pain: Secondary | ICD-10-CM | POA: Diagnosis not present

## 2020-08-03 DIAGNOSIS — M25511 Pain in right shoulder: Secondary | ICD-10-CM

## 2020-08-03 NOTE — Assessment & Plan Note (Signed)
Patient does have a rotator cuff tear but also does have acromioclavicular and glenohumeral arthritis.  We discussed different treatment options.  Patient at this point would like to try the physical therapy.  Will be sent at this time.  Only very mild improvement with the injection so far.  When he see patient again in 6 weeks we will consider the possibility of acromioclavicular injection if no significant improvement then MRI would be necessary.  Patient is in agreement.  Follow-up with me again in 6 weeks

## 2020-08-03 NOTE — Progress Notes (Signed)
Virtual Visit via Video Note  I connected with Richard Ellis on 08/03/20 at  4:00 PM EST by a video enabled telemedicine application and verified that I am speaking with the correct person using two identifiers.   Patient is alone  Location: Patient: In home setting  provider: In office setting   I discussed the limitations of evaluation and management by telemedicine and the availability of in person appointments. The patient expressed understanding and agreed to proceed.  History of Present Illness: Patient is a 74 year old gentleman who did have a chronic degenerative tearing of the rotator cuff with reactive subacromial bursitis as well as moderate arthritic changes.  We did see patient previously October 13 and given a right glenohumeral injection.   Patient did have x-rays done at last exam that were independently visualized by me.  Patient did have moderate osteoarthritic changes of the glenohumeral and acromioclavicular joint.   Observations/Objective: Alert and oriented, patient does have a mild cough.  Unable to see patient secondary to difficulty with the virtual platform   Assessment and Plan:Rotator cuff tear Patient does have a rotator cuff tear but also does have acromioclavicular and glenohumeral arthritis.  We discussed different treatment options.  Patient at this point would like to try the physical therapy.  Will be sent at this time.  Only very mild improvement with the injection so far.  When he see patient again in 6 weeks we will consider the possibility of acromioclavicular injection if no significant improvement then MRI would be necessary.  Patient is in agreement.  Follow-up with me again in 6 weeks     Follow Up Instructions: 6 weeks    I discussed the assessment and treatment plan with the patient. The patient was provided an opportunity to ask questions and all were answered. The patient agreed with the plan and demonstrated an understanding of the  instructions.   The patient was advised to call back or seek an in-person evaluation if the symptoms worsen or if the condition fails to improve as anticipated.  I provided 15 minutes oftime during this encounter.   Lyndal Pulley, DO

## 2020-08-22 DIAGNOSIS — Z20822 Contact with and (suspected) exposure to covid-19: Secondary | ICD-10-CM | POA: Diagnosis not present

## 2020-09-29 ENCOUNTER — Other Ambulatory Visit: Payer: Self-pay

## 2020-09-29 ENCOUNTER — Telehealth (INDEPENDENT_AMBULATORY_CARE_PROVIDER_SITE_OTHER): Payer: Medicare Other | Admitting: Internal Medicine

## 2020-09-29 ENCOUNTER — Encounter: Payer: Self-pay | Admitting: Internal Medicine

## 2020-09-29 DIAGNOSIS — J4520 Mild intermittent asthma, uncomplicated: Secondary | ICD-10-CM | POA: Diagnosis not present

## 2020-09-29 DIAGNOSIS — R053 Chronic cough: Secondary | ICD-10-CM | POA: Diagnosis not present

## 2020-09-29 DIAGNOSIS — R059 Cough, unspecified: Secondary | ICD-10-CM

## 2020-09-29 MED ORDER — BENZONATATE 200 MG PO CAPS: 200.0000 mg | ORAL_CAPSULE | Freq: Three times a day (TID) | ORAL | 1 refills | Status: DC | PRN

## 2020-09-29 MED ORDER — PANTOPRAZOLE SODIUM 40 MG PO TBEC: 40.0000 mg | DELAYED_RELEASE_TABLET | Freq: Every day | ORAL | 1 refills | Status: DC

## 2020-09-29 NOTE — Assessment & Plan Note (Signed)
Richard Ellis is complaining of persistent cough that he had during the day.  It comes in spells.  He reports no call for shortness of breath while running.  Cough drops helped him quite a bit.  Since he has a cough at night he has to put a cough drop under his cheek to be able to sleep.  He never tried PPIs.  Allergy meds do not help, inhalers seem to help a little.  He is not sure if his cough has developed before or after he has bypass surgery.  The cough is dry.  No wheezing.   Unclear etiology.  I would like to obtain a high-resolution CT of the chest.  Obtain pulmonary consultation with Dr. Melvyn Novas.  Prescribed Tessalon to use as needed and a trial of PPI (Protonix). I am not sure about the asthmatic component of his cough.  Obtain CT of the chest.  He will probably need pulmonary function tests too.

## 2020-09-29 NOTE — Assessment & Plan Note (Signed)
Richard Ellis is complaining of persistent cough that he had during the day.  It comes in spells.  He reports no call for shortness of breath while running.  Cough drops helped him quite a bit.  Since he has a cough at night he has to put a cough drop under his cheek to be able to sleep.  He never tried PPIs.  Allergy meds do not help, inhalers seem to help a little.  He is not sure if his cough has developed before or after he has bypass surgery.  The cough is dry.  No wheezing.   Unclear etiology.  I would like to obtain a high-resolution CT of the chest.  Obtain pulmonary consultation with Dr. Melvyn Novas.  Prescribed Tessalon to use as needed and a trial of PPI (Protonix).

## 2020-09-29 NOTE — Progress Notes (Signed)
Virtual Visit via Video Note  I connected with Richard Ellis on 09/29/20 at  3:00 PM EST by a video enabled telemedicine application and verified that I am speaking with the correct person using two identifiers.   I discussed the limitations of evaluation and management by telemedicine and the availability of in person appointments. The patient expressed understanding and agreed to proceed.  I was located at our [facility] office. The patient was at home. There was no one else present in the visit.   History of Present Illness:  Richard Ellis is complaining of persistent cough that he had during the day.  It comes in spells.  He reports no call for shortness of breath while running.  Cough drops helped him quite a bit.  Since he has a cough at night he has to put a cough drop under his cheek to be able to sleep.  He never tried PPIs.  Allergy meds do not help, inhalers seem to help a little.  He is not sure if his cough has developed before or after he has bypass surgery.  The cough is dry.  No wheezing   There has been no runny nose, chest pain, shortness of breath, abdominal pain, diarrhea, constipation, arthralgias, skin rashes.   Observations/Objective: The patient appears to be in no acute distress, looks normal.  He is coughing a lot during the day be due to visit.  He does not appear short of breath.  Assessment and Plan:  See my Assessment and Plan. Follow Up Instructions:    I discussed the assessment and treatment plan with the patient. The patient was provided an opportunity to ask questions and all were answered. The patient agreed with the plan and demonstrated an understanding of the instructions.   The patient was advised to call back or seek an in-person evaluation if the symptoms worsen or if the condition fails to improve as anticipated.  I provided face-to-face time during this encounter. We were at different locations.   [name], MD

## 2020-10-14 ENCOUNTER — Other Ambulatory Visit: Payer: Self-pay

## 2020-10-14 ENCOUNTER — Other Ambulatory Visit: Payer: Self-pay | Admitting: Internal Medicine

## 2020-10-14 ENCOUNTER — Ambulatory Visit
Admission: RE | Admit: 2020-10-14 | Discharge: 2020-10-14 | Disposition: A | Discharge status: Home / Self Care | Payer: Medicare Other | Source: Ambulatory Visit | Attending: Internal Medicine | Admitting: Internal Medicine

## 2020-10-14 DIAGNOSIS — I700 Atherosclerosis of aorta: Secondary | ICD-10-CM | POA: Diagnosis not present

## 2020-10-14 DIAGNOSIS — R053 Chronic cough: Secondary | ICD-10-CM

## 2020-10-14 DIAGNOSIS — R059 Cough, unspecified: Secondary | ICD-10-CM | POA: Diagnosis not present

## 2020-10-14 DIAGNOSIS — I2510 Atherosclerotic heart disease of native coronary artery without angina pectoris: Secondary | ICD-10-CM | POA: Diagnosis not present

## 2020-10-21 ENCOUNTER — Other Ambulatory Visit: Payer: Self-pay | Admitting: Internal Medicine

## 2020-11-15 ENCOUNTER — Other Ambulatory Visit: Payer: Self-pay | Admitting: Internal Medicine

## 2020-11-16 ENCOUNTER — Other Ambulatory Visit: Payer: Self-pay

## 2020-11-16 ENCOUNTER — Encounter: Payer: Self-pay | Admitting: Internal Medicine

## 2020-11-16 ENCOUNTER — Ambulatory Visit (INDEPENDENT_AMBULATORY_CARE_PROVIDER_SITE_OTHER): Payer: Medicare Other | Admitting: Internal Medicine

## 2020-11-16 DIAGNOSIS — R053 Chronic cough: Secondary | ICD-10-CM | POA: Diagnosis not present

## 2020-11-16 MED ORDER — PREDNISONE 10 MG PO TABS: ORAL_TABLET | ORAL | 0 refills | Status: DC

## 2020-11-16 MED ORDER — FAMOTIDINE 20 MG PO TABS: ORAL_TABLET | ORAL | 11 refills | Status: DC

## 2020-11-16 NOTE — Progress Notes (Signed)
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 Consult    C/o  dry cough worse in winter, had 10 years or more,rarely wheezing  Dyspnea:  Cough p finished  Esp in cold weather / never tried singulair Cough: dry worse with chocalate, cold air, onions/ hot humid Sleep: no cough sleeping flat or on waking in am SABA use: none   No obvious day to day or daytime variability or assoc excess/ purulent sputum or mucus plugs or hemoptysis or cp or chest tightness, subjective wheeze or overt sinus or hb symptoms.   sleeping without nocturnal  or early am exacerbation  of respiratory  c/o's or need for noct saba. Also denies any obvious fluctuation of symptoms with weather or environmental changes or other aggravating or alleviating factors except as outlined above   No unusual exposure hx or h/o childhood pna/ asthma or knowledge of premature birth.  Current Allergies, Complete Past Medical History, Past Surgical History, Family History, and Social History were reviewed in [employer] record.  ROS  The following are not active complaints unless bolded Hoarseness, sore throat, dysphagia, dental problems, itching, sneezing,  nasal congestion or discharge of excess mucus or purulent secretions, ear ache,   fever, chills, sweats,  unintended wt loss or wt gain, classically pleuritic or exertional cp,  orthopnea pnd or arm/hand swelling  or leg swelling, presyncope, palpitations, abdominal pain, anorexia, nausea, vomiting, diarrhea  or change in bowel habits or change in bladder habits, change in stools or change in urine, dysuria, hematuria,  rash, arthralgias, visual complaints, headache, numbness, weakness or ataxia or problems with walking or coordination,  change in mood or  memory.              Past Medical History:  Diagnosis Date  . Allergy    rhinitis  . Anemia    "Hgb always on the low side" (07/02/2013)  . CAD (coronary artery disease)    a. LHC 2/17: pLAD 50, mLAD 95, pRCA 30, dRCA 25, EF 50-55% >> PCI:  3 x 18 mm Resolute DES to mLAD   . Chronic bronchitis ([place])    "not in the last year since I started taking allergy shots" (10/09/2016)  . Chronic lower back pain   . Chronic sinus bradycardia 07/02/2013  . Coronary artery disease 10/15/2016  . Cryptogenic stroke ([place]) 07/02/2013   a. "small ischemic occipital right sided" (07/02/2013)  //  b. Event monitor 10/14: sinus brady  //  c. Carotid US 10/14: bilat ICA 1-39%  . Esophageal stricture   . Exertional angina ([place]) 10/09/2016  . Heart murmur    "as a child" (07/02/2013)  . History of echocardiogram    a. Echo 10/14: EF 60-65%, no RWMA,  normal diastolic function  . Hyperlipidemia   . Hypothyroidism   . Nonallopathic lesion of lumbosacral region 06/04/2017  . Nonallopathic lesion of sacral region 06/04/2017  . Nonallopathic lesion of thoracic region 06/04/2017  . Osteoarthritis    "lower back" (10/09/2016)  . PFO (patent foramen ovale)    a. TEE 11/14: mild LVH, EF 60-65%, small PFO, R-L shunt    Outpatient Medications Prior to Visit  Medication Sig Dispense Refill  . aspirin EC 81 MG tablet Take 1 tablet (81 mg total) by mouth daily. 90 tablet 3  . Cholecalciferol 2000 UNITS CAPS Take 2,000 Units by mouth every evening.    . Ferrous Sulfate  (IRON) 325 (65 Fe) MG TABS Take 1 tablet by mouth. Taking three times a week    . levothyroxine (SYNTHROID, LEVOTHROID) 25 MCG tablet 1 tab a day, except for mon-wed-fri 2 tabs a day 90 tablet 3  . Omega-3 Fatty Acids (FISH OIL) 1200 MG CAPS Take 1,200 mg by mouth daily.     . pantoprazole (PROTONIX) 40 MG tablet TAKE 1 TABLET BY MOUTH EVERY DAY *ANNUAL APPT W/LABS DUE IN MARCH MUST SEE PROVIDER FOR REFILLS 30 tablet 0  . Turmeric 500 MG CAPS Take 1 capsule by mouth at bedtime.    . benzonatate (TESSALON) 200 MG capsule Take 1 capsule (200 mg total) by mouth 3 (three) times daily as needed for cough. (Patient not taking: Reported on 11/16/2020) 60 capsule 1  . promethazine-codeine (PHENERGAN WITH CODEINE) 6.25-10 MG/5ML syrup Take 5 mLs by mouth every 4 (four) hours as needed. (Patient not taking: Reported on 11/16/2020) 300 mL 0   No facility-administered medications prior to visit.     Objective:     BP 128/70 (BP Location: Left Arm, Cuff Size: Normal)   Pulse (!) 53   Temp (!) 97.3 F (36.3 C) (Temporal)   Ht 6\' 1"  (1.854 m)   Wt 190 lb 9.6 oz (86.5 kg)   SpO2 98%   BMI 25.15 kg/m   SpO2: 98 %    amb wm nad with spont harsh throat clearing and dry cough   HEENT : pt wearing mask not removed for exam due to covid -19 concerns.    NECK :  without JVD/Nodes/TM/ nl carotid upstrokes bilaterally   LUNGS: no acc muscle use,  Nl contour chest which is clear to A and P bilaterally without cough on insp or exp maneuvers   CV:  RRR  no s3 or murmur or increase in P2, and no edema   ABD:  soft and nontender with nl inspiratory excursion in the supine position. No bruits or organomegaly appreciated, bowel sounds nl  MS:  Nl gait/ ext warm without deformities, calf tenderness, cyanosis or clubbing No obvious joint restrictions   SKIN: warm and dry without lesions    NEURO:  alert, approp, nl sensorium with  no motor or cerebellar deficits apparent.    I personally reviewed images  and agree with radiology impression as follows:   Chest CT s contrast 10/14/20 1. No evidence of fibrotic interstitial lung disease. 2. Mild, lobular air trapping on expiratory phase imaging, suggestive of small airways disease. 3. Coronary artery disease.     Assessment   No problem-specific Assessment & Plan notes found for this encounter.     Christinia Gully, MD 11/16/2020

## 2020-11-16 NOTE — Assessment & Plan Note (Signed)
Onset 2004 worse since 2016 with neg resp to allergy eval/Rx Orvil Feil nor better on inhalers around 2016 - CT chest 10/14/20  Neg   The most common causes of chronic cough in immunocompetent adults include the following: upper airway cough syndrome (UACS), previously referred to as postnasal drip syndrome (PNDS), which is caused by variety of rhinosinus conditions; (2) asthma; (3) GERD; (4) chronic bronchitis from cigarette smoking or other inhaled environmental irritants; (5) nonasthmatic eosinophilic bronchitis; and (6) bronchiectasis.   These conditions, singly or in combination, have accounted for up to 94% of the causes of chronic cough in prospective studies.   Other conditions have constituted no >6% of the causes in prospective studies These have included bronchogenic carcinoma, chronic interstitial pneumonia, sarcoidosis, left ventricular failure, ACEI-induced cough, and aspiration from a condition associated with pharyngeal dysfunction.    Chronic cough is often simultaneously caused by more than one condition. A single cause has been found from 38 to 82% of the time, multiple causes from 18 to 62%. Multiply caused cough has been the result of three diseases up to 42% of the time.       Of the three most common causes of  Sub-acute / recurrent or chronic cough, only one (GERD)  can actually contribute to/ trigger  the other two (asthma and post nasal drip syndrome)  and perpetuate the cylce of cough.  While not intuitively obvious, many patients with chronic low grade reflux do not cough until there is a primary insult that disturbs the protective epithelial barrier and exposes sensitive nerve endings.   This is typically viral but can due to PNDS and  either may apply here.   The point is that once this occurs, it is difficult to eliminate the cycle  using anything but a maximally effective acid suppression regimen at least in the short run, accompanied by an appropriate diet to address non  acid GERD and control / eliminate the cough itself to the extent possible with tessalon / hard rock candy >>> also so added 6 day taper off  Prednisone starting at 40 mg per day in case of component of Th-2 driven upper or lower airways inflammation (if cough responds short term only to relapse before return while will on full rx for uacs (as above), then  that would point to allergic rhinitis/ asthma or eos bronchitis as alternative dx)           Each maintenance medication was reviewed in detail including emphasizing most importantly the difference between maintenance and prns and under what circumstances the prns are to be triggered using an action plan format where appropriate.  Total time for H and P, chart review, counseling, and generating customized AVS unique to this new pt office visit / same day charting =   47 min

## 2020-11-16 NOTE — Patient Instructions (Signed)
Pantoprazole (protonix) 40 mg   Take  30-60 min before first meal of the day and Pepcid (famotidine)  20 mg one after supper  until return to office - this is the best way to tell whether stomach acid is contributing to your problem.     Prednisone 10 mg take  4 each am x 2 days,   2 each am x 2 days,  1 each am x 2 days and stop    GERD (REFLUX)  is an extremely common cause of respiratory symptoms just like yours , many times with no obvious heartburn at all.    It can be treated with medication, but also with lifestyle changes including elevation of the head of your bed (ideally with 6 -8inch blocks under the headboard of your bed),  Smoking cessation, avoidance of late meals, excessive alcohol, and avoid fatty foods, chocolate, peppermint, colas, red wine, and acidic juices such as orange juice.  NO MINT OR MENTHOL PRODUCTS SO NO COUGH DROPS  USE SUGARLESS CANDY INSTEAD (Jolley ranchers or Stover's or Life Savers) or even ice chips will also do - the key is to swallow to prevent all throat clearing. NO OIL BASED VITAMINS - use powdered substitutes.  Avoid fish oil when coughing.   Please schedule a follow up office visit in 6 weeks, call sooner if needed

## 2020-11-21 ENCOUNTER — Other Ambulatory Visit: Payer: Self-pay | Admitting: Internal Medicine

## 2020-12-08 ENCOUNTER — Other Ambulatory Visit: Payer: Self-pay | Admitting: Internal Medicine

## 2020-12-28 ENCOUNTER — Ambulatory Visit: Payer: Medicare Other | Admitting: Internal Medicine

## 2021-01-10 ENCOUNTER — Other Ambulatory Visit: Payer: Self-pay

## 2021-01-12 ENCOUNTER — Ambulatory Visit (INDEPENDENT_AMBULATORY_CARE_PROVIDER_SITE_OTHER): Payer: Medicare Other | Admitting: Internal Medicine

## 2021-01-12 ENCOUNTER — Other Ambulatory Visit: Payer: Self-pay

## 2021-01-12 ENCOUNTER — Encounter: Payer: Self-pay | Admitting: Internal Medicine

## 2021-01-12 VITALS — BP 112/62 | HR 49 | Temp 97.7°F | Ht 73.0 in | Wt 192.0 lb

## 2021-01-12 DIAGNOSIS — G720 Drug-induced myopathy: Secondary | ICD-10-CM

## 2021-01-12 DIAGNOSIS — I25810 Atherosclerosis of coronary artery bypass graft(s) without angina pectoris: Secondary | ICD-10-CM | POA: Diagnosis not present

## 2021-01-12 DIAGNOSIS — E039 Hypothyroidism, unspecified: Secondary | ICD-10-CM | POA: Diagnosis not present

## 2021-01-12 DIAGNOSIS — R202 Paresthesia of skin: Secondary | ICD-10-CM

## 2021-01-12 DIAGNOSIS — Z8673 Personal history of transient ischemic attack (TIA), and cerebral infarction without residual deficits: Secondary | ICD-10-CM

## 2021-01-12 DIAGNOSIS — R001 Bradycardia, unspecified: Secondary | ICD-10-CM

## 2021-01-12 DIAGNOSIS — R5383 Other fatigue: Secondary | ICD-10-CM | POA: Diagnosis not present

## 2021-01-12 DIAGNOSIS — E785 Hyperlipidemia, unspecified: Secondary | ICD-10-CM

## 2021-01-12 DIAGNOSIS — T466X5A Adverse effect of antihyperlipidemic and antiarteriosclerotic drugs, initial encounter: Secondary | ICD-10-CM | POA: Diagnosis not present

## 2021-01-12 NOTE — Assessment & Plan Note (Addendum)
?  post-COVID fatigue and brain fog.  Discussed.  Obtain TSH, cortisol, CBC, chemistry

## 2021-01-12 NOTE — Patient Instructions (Signed)
You can try Lion's Mane Mushroom capsules for memory problems, decreased focus, mental fog, neuropathy ([email or web address])  Cordiceps

## 2021-01-12 NOTE — Assessment & Plan Note (Signed)
HR 42-48 normally

## 2021-01-12 NOTE — Progress Notes (Signed)
Subjective:  Patient ID: Richard Ellis, male    DOB: 06-21-1946  Age: 75 y.o. MRN: [id number]  CC: Follow-up   HPI [name] presents for cough - much better F/u hypothyroidism, GERD C/o fatigue post-COVID   Outpatient Medications Prior to Visit  Medication Sig Dispense Refill  . aspirin EC 81 MG tablet Take 1 tablet (81 mg total) by mouth daily. 90 tablet 3  . Cholecalciferol 2000 UNITS CAPS Take 2,000 Units by mouth every evening.    . Ferrous Sulfate (IRON) 325 (65 Fe) MG TABS Take 1 tablet by mouth. Taking three times a week    . levothyroxine (SYNTHROID, LEVOTHROID) 25 MCG tablet 1 tab a day, except for mon-wed-fri 2 tabs a day 90 tablet 3  . pantoprazole (PROTONIX) 40 MG tablet TAKE 1 TABLET BY MOUTH EVERY DAY *ANNUAL APPT W/LABS DUE IN MARCH MUST SEE PROVIDER FOR REFILLS 30 tablet 0  . Turmeric 500 MG CAPS Take 1 capsule by mouth at bedtime.    . benzonatate (TESSALON) 200 MG capsule Take 1 capsule (200 mg total) by mouth 3 (three) times daily as needed for cough. (Patient not taking: No sig reported) 60 capsule 1  . famotidine (PEPCID) 20 MG tablet TAKE 1 TABLET BY MOUTH AFTER DINNER (OTC - NOT COVERED) (Patient not taking: Reported on 01/12/2021) 30 tablet 11  . predniSONE (DELTASONE) 10 MG tablet Take  4 each am x 2 days,   2 each am x 2 days,  1 each am x 2 days and stop (Patient not taking: Reported on 01/12/2021) 14 tablet 0   No facility-administered medications prior to visit.    ROS: Review of Systems  Constitutional: Positive for fatigue. Negative for appetite change and unexpected weight change.  HENT: Negative for congestion, nosebleeds, sneezing, sore throat and trouble swallowing.   Eyes: Negative for itching and visual disturbance.  Respiratory: Positive for cough.   Cardiovascular: Negative for chest pain, palpitations and leg swelling.  Gastrointestinal: Negative for abdominal distention, blood in stool, diarrhea and nausea.  Genitourinary: Negative for  frequency and hematuria.  Musculoskeletal: Negative for back pain, gait problem, joint swelling and neck pain.  Skin: Negative for rash.  Neurological: Negative for dizziness, tremors, speech difficulty and weakness.  Psychiatric/Behavioral: Negative for agitation, dysphoric mood and sleep disturbance. The patient is not nervous/anxious.     Objective:  BP 112/62 (BP Location: Left Arm)   Pulse (!) 49   Temp 97.7 F (36.5 C) (Oral)   Ht 6\' 1"  (1.854 m)   Wt 192 lb (87.1 kg)   SpO2 98%   BMI 25.33 kg/m   BP Readings from Last 3 Encounters:  01/12/21 112/62  11/16/20 128/70  06/29/20 128/84    Wt Readings from Last 3 Encounters:  01/12/21 192 lb (87.1 kg)  11/16/20 190 lb 9.6 oz (86.5 kg)  06/29/20 194 lb (88 kg)    Physical Exam Constitutional:      General: He is not in acute distress.    Appearance: He is well-developed.     Comments: NAD  Eyes:     Conjunctiva/sclera: Conjunctivae normal.     Pupils: Pupils are equal, round, and reactive to light.  Neck:     Thyroid: No thyromegaly.     Vascular: No JVD.  Cardiovascular:     Rate and Rhythm: Normal rate and regular rhythm.     Heart sounds: Normal heart sounds. No murmur heard. No friction rub. No gallop.   Pulmonary:  Effort: Pulmonary effort is normal. No respiratory distress.     Breath sounds: Normal breath sounds. No wheezing or rales.  Chest:     Chest wall: No tenderness.  Abdominal:     General: Bowel sounds are normal. There is no distension.     Palpations: Abdomen is soft. There is no mass.     Tenderness: There is no abdominal tenderness. There is no guarding or rebound.  Musculoskeletal:        General: No tenderness. Normal range of motion.     Cervical back: Normal range of motion.  Lymphadenopathy:     Cervical: No cervical adenopathy.  Skin:    General: Skin is warm and dry.     Findings: No rash.  Neurological:     Mental Status: He is alert and oriented to person, place, and time.      Cranial Nerves: No cranial nerve deficit.     Motor: No abnormal muscle tone.     Coordination: Coordination normal.     Gait: Gait normal.     Deep Tendon Reflexes: Reflexes are normal and symmetric.  Psychiatric:        Behavior: Behavior normal.        Thought Content: Thought content normal.        Judgment: Judgment normal.   dry skin  Lab Results  Component Value Date   WBC 5.3 12/02/2018   HGB 14.1 12/02/2018   HCT 41.2 12/02/2018   PLT 209.0 12/02/2018   GLUCOSE 100 (H) 12/02/2018   CHOL 122 (L) 04/18/2016   TRIG 115 04/18/2016   HDL 52 04/18/2016   LDLDIRECT 122.0 05/09/2015   LDLCALC 47 04/18/2016   ALT 16 12/02/2018   AST 19 12/02/2018   NA 138 12/02/2018   K 4.1 12/02/2018   CL 103 12/02/2018   CREATININE 1.05 12/02/2018   BUN 19 12/02/2018   CO2 29 12/02/2018   TSH 3.37 12/02/2018   PSA 4.83 (H) 04/19/2016   INR 1.33 10/15/2016   HGBA1C 5.6 10/11/2016    CT Chest High Resolution  Result Date: 10/17/2020 CLINICAL DATA:  Persistent cough EXAM: CT CHEST WITHOUT CONTRAST TECHNIQUE: Multidetector CT imaging of the chest was performed following the standard protocol without intravenous contrast. High resolution imaging of the lungs, as well as inspiratory and expiratory imaging, was performed. COMPARISON:  None. FINDINGS: Cardiovascular: Aortic atherosclerosis. Normal heart size. Three-vessel coronary artery calcifications and/or stents. No pericardial effusion. Mediastinum/Nodes: No enlarged mediastinal, hilar, or axillary lymph nodes. Thyroid gland, trachea, and esophagus demonstrate no significant findings. Lungs/Pleura: No evidence of fibrotic interstitial lung disease. Mild, lobular air trapping on expiratory phase imaging. No pleural effusion or pneumothorax. Upper Abdomen: No acute abnormality. Musculoskeletal: No chest wall mass or suspicious bone lesions identified. IMPRESSION: 1. No evidence of fibrotic interstitial lung disease. 2. Mild, lobular air  trapping on expiratory phase imaging, suggestive of small airways disease. 3. Coronary artery disease. Aortic Atherosclerosis (ICD10-I70.0). Electronically Signed   By: Eddie Candle M.D.   On: 10/17/2020 07:55    Assessment & Plan:    [name], MD

## 2021-01-13 ENCOUNTER — Ambulatory Visit (INDEPENDENT_AMBULATORY_CARE_PROVIDER_SITE_OTHER): Payer: Medicare Other | Admitting: Internal Medicine

## 2021-01-13 ENCOUNTER — Encounter: Payer: Self-pay | Admitting: Internal Medicine

## 2021-01-13 DIAGNOSIS — G720 Drug-induced myopathy: Secondary | ICD-10-CM | POA: Insufficient documentation

## 2021-01-13 DIAGNOSIS — R053 Chronic cough: Secondary | ICD-10-CM | POA: Diagnosis not present

## 2021-01-13 LAB — CBC WITH DIFFERENTIAL/PLATELET
Basophils Absolute: 0 10*3/uL (ref 0.0–0.1)
Basophils Relative: 0.8 % (ref 0.0–3.0)
Eosinophils Absolute: 0.2 10*3/uL (ref 0.0–0.7)
Eosinophils Relative: 2.9 % (ref 0.0–5.0)
HCT: 40 % (ref 39.0–52.0)
Hemoglobin: 13.6 g/dL (ref 13.0–17.0)
Lymphocytes Relative: 26.2 % (ref 12.0–46.0)
Lymphs Abs: 1.4 10*3/uL (ref 0.7–4.0)
MCHC: 33.9 g/dL (ref 30.0–36.0)
MCV: 93 fl (ref 78.0–100.0)
Monocytes Absolute: 0.6 10*3/uL (ref 0.1–1.0)
Monocytes Relative: 11.2 % (ref 3.0–12.0)
Neutro Abs: 3.1 10*3/uL (ref 1.4–7.7)
Neutrophils Relative %: 58.9 % (ref 43.0–77.0)
Platelets: 213 10*3/uL (ref 150.0–400.0)
RBC: 4.3 Mil/uL (ref 4.22–5.81)
RDW: 12.9 % (ref 11.5–15.5)
WBC: 5.2 10*3/uL (ref 4.0–10.5)

## 2021-01-13 LAB — COMPREHENSIVE METABOLIC PANEL
ALT: 16 U/L (ref 0–53)
AST: 21 U/L (ref 0–37)
Albumin: 4.3 g/dL (ref 3.5–5.2)
Alkaline Phosphatase: 42 U/L (ref 39–117)
BUN: 22 mg/dL (ref 6–23)
CO2: 27 mEq/L (ref 19–32)
Calcium: 9.4 mg/dL (ref 8.4–10.5)
Chloride: 105 mEq/L (ref 96–112)
Creatinine, Ser: 1.1 mg/dL (ref 0.40–1.50)
GFR: 66.01 mL/min (ref >60.00)
Glucose, Bld: 95 mg/dL (ref 70–99)
Potassium: 4.2 mEq/L (ref 3.5–5.1)
Sodium: 139 mEq/L (ref 135–145)
Total Bilirubin: 0.5 mg/dL (ref 0.2–1.2)
Total Protein: 6.9 g/dL (ref 6.0–8.3)

## 2021-01-13 LAB — URINALYSIS
Bilirubin Urine: NEGATIVE
Hgb urine dipstick: NEGATIVE
Ketones, ur: NEGATIVE
Leukocytes,Ua: NEGATIVE
Nitrite: NEGATIVE
Specific Gravity, Urine: 1.025 (ref 1.000–1.030)
Total Protein, Urine: NEGATIVE
Urine Glucose: NEGATIVE
Urobilinogen, UA: 0.2 (ref 0.0–1.0)
pH: 5.5 (ref 5.0–8.0)

## 2021-01-13 LAB — VITAMIN B12: Vitamin B-12: 477 pg/mL (ref 211–911)

## 2021-01-13 LAB — LIPID PANEL
Cholesterol: 272 mg/dL — ABNORMAL HIGH (ref 0–200)
HDL: 40.5 mg/dL (ref >39.00)
LDL Cholesterol: 204 mg/dL — ABNORMAL HIGH (ref 0–99)
NonHDL: 231.18
Total CHOL/HDL Ratio: 7
Triglycerides: 136 mg/dL (ref 0.0–149.0)
VLDL: 27.2 mg/dL (ref 0.0–40.0)

## 2021-01-13 LAB — T4, FREE: Free T4: 1 ng/dL (ref 0.60–1.60)

## 2021-01-13 LAB — CORTISOL: Cortisol, Plasma: 13.2 ug/dL

## 2021-01-13 LAB — TSH: TSH: 3.33 u[IU]/mL (ref 0.35–4.50)

## 2021-01-13 MED ORDER — BUDESONIDE-FORMOTEROL FUMARATE 80-4.5 MCG/ACT IN AERO: INHALATION_SPRAY | RESPIRATORY_TRACT | 12 refills | Status: DC

## 2021-01-13 MED ORDER — PREDNISONE 10 MG PO TABS: ORAL_TABLET | ORAL | 0 refills | Status: DC

## 2021-01-13 NOTE — Assessment & Plan Note (Signed)
Worse.  He needs a follow-up with Dr. Burt Knack and/or lipid clinic to start Hoberg.

## 2021-01-13 NOTE — Assessment & Plan Note (Signed)
Statin intolerant.  Needs Repatha

## 2021-01-13 NOTE — Patient Instructions (Signed)
Symbicort 80 Take 2 puffs first thing in am and then another 2 puffs about 12 hours later.   Work on inhaler technique:  relax and gently blow all the way out then take a nice smooth deep breath back in, triggering the inhaler at same time you start breathing in.  Hold at least 5 seconds if you can. Blow out thru nose. Rinse and gargle with water when done  If not improving > Prednisone 10 mg take  4 each am x 2 days,   2 each am x 2 days,  1 each am x 2 days and stop    Please schedule a follow up visit in 3 months but call sooner if needed - bring inhaler

## 2021-01-13 NOTE — Progress Notes (Signed)
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 Consult    C/o  dry cough worse in winter, had 10 years or more,rarely wheezing  Dyspnea:  Cough p finished prednisone  Esp in cold weather / never tried singulair Cough: dry worse with chocalate, cold air, smell of frying onions/ hot humid Sleep: no cough sleeping flat or on waking in am SABA use: none  rec Pantoprazole (protonix) 40 mg   Take  30-60 min before first meal of the day and Pepcid (famotidine)  20 mg one after supper  until return to office - this is the best way to tell whether stomach acid is contributing to your problem.   Prednisone 10 mg take  4 each am x 2 days,   2 each am x 2 days,  1 each am x 2 days and stop  GERD diet Please schedule a follow up office visit in 6 weeks, call sooner if needed    01/13/2021  f/u ov/[place] re: cough x 10 years  Both times eliminated by prednisone for extended periods / stopped gerd rx p one month s cough until jogging x one week  Chief Complaint  Patient presents with  . Follow-up    Doing well, cough is better  Dyspnea:  Does fine jogging / some cough p running x 30 min Cough: dry, esp with cold air exp  Sleeping: none  SABA use: none  02: none  Covid status:   vax x 3 pfizer    No obvious day to day or daytime variability or  assoc excess/ purulent sputum or mucus plugs or hemoptysis or cp or chest tightness, subjective wheeze or overt sinus or hb symptoms.   Sleeping  without nocturnal  or early am exacerbation  of respiratory  c/o's or need for noct saba. Also denies any obvious fluctuation of symptoms with weather or environmental changes or other aggravating or alleviating factors except as outlined above   No unusual exposure hx or h/o childhood pna/ asthma or knowledge of premature birth.  Current Allergies, Complete Past Medical History, Past Surgical History, Family History, and Social History were reviewed in [employer] record.  ROS  The following are not active complaints unless bolded Hoarseness, sore throat, dysphagia, dental problems, itching, sneezing,  nasal congestion or discharge of excess mucus or purulent secretions, ear ache,   fever, chills, sweats, unintended wt loss or wt gain, classically pleuritic or exertional cp,  orthopnea pnd or arm/hand swelling  or leg swelling, presyncope, palpitations, abdominal pain, anorexia, nausea, vomiting, diarrhea  or change in bowel habits or change in bladder habits, change in stools or change in urine, dysuria, hematuria,  rash, arthralgias, visual complaints, headache, numbness, weakness or ataxia or problems  with walking or coordination,  change in mood or  memory.        Current Meds  Medication Sig  . aspirin EC 81 MG tablet Take 1 tablet (81 mg total) by mouth daily.  . Cholecalciferol 2000 UNITS CAPS Take 2,000 Units by mouth every evening.  . Ferrous Sulfate (IRON) 325 (65 Fe) MG TABS Take 1 tablet by mouth. Taking three times a week  . levothyroxine (SYNTHROID, LEVOTHROID) 25 MCG tablet 1 tab a day, except for mon-wed-fri 2 tabs a day  . Turmeric 500 MG CAPS Take 1 capsule by mouth at bedtime.             Past Medical History:  Diagnosis Date  . Allergy    rhinitis  . Anemia    "Hgb always on the low side"  (07/02/2013)  . CAD (coronary artery disease)    a. LHC 2/17: pLAD 50, mLAD 95, pRCA 30, dRCA 25, EF 50-55% >> PCI:  3 x 18 mm Resolute DES to mLAD   . Chronic bronchitis ([place])    "not in the last year since I started taking allergy shots" (10/09/2016)  . Chronic lower back pain   . Chronic sinus bradycardia 07/02/2013  . Coronary artery disease 10/15/2016  . Cryptogenic stroke ([place]) 07/02/2013   a. "small ischemic occipital right sided" (07/02/2013)  //  b. Event monitor 10/14: sinus brady  //  c. Carotid US 10/14: bilat ICA 1-39%  . Esophageal stricture   . Exertional angina ([place]) 10/09/2016  . Heart murmur    "as a child" (07/02/2013)  . History of echocardiogram    a. Echo 10/14: EF 60-65%, no RWMA, normal diastolic function  . Hyperlipidemia   . Hypothyroidism   . Nonallopathic lesion of lumbosacral region 06/04/2017  . Nonallopathic lesion of sacral region 06/04/2017  . Nonallopathic lesion of thoracic region 06/04/2017  . Osteoarthritis    "lower back" (10/09/2016)  . PFO (patent foramen ovale)    a. TEE 11/14: mild LVH, EF 60-65%, small PFO, R-L shunt       Objective:       Wt Readings from Last 3 Encounters:  01/13/21 192 lb 12.8 oz (87.5 kg)  01/12/21 192 lb (87.1 kg)  11/16/20 190 lb 9.6 oz (86.5 kg)      Vital signs reviewed  01/13/2021  - Note at rest 02 sats  99% on RA   General appearance:    amb wm nad   HEENT : pt wearing mask not removed for exam due to covid -19 concerns.    NECK :  without JVD/Nodes/TM/ nl carotid upstrokes bilaterally   LUNGS: no acc muscle use,  Nl contour chest which is clear to A and P bilaterally without cough on insp or exp maneuvers   CV:  RRR  no s3 or murmur or increase in P2, and no edema   ABD:  soft and nontender with nl inspiratory excursion in the supine position. No bruits or organomegaly appreciated, bowel sounds nl  MS:  Nl gait/ ext warm without deformities, calf tenderness, cyanosis or clubbing No obvious joint  restrictions   SKIN: warm and dry without lesions    NEURO:  alert, approp, nl sensorium with  no motor or cerebellar deficits apparent.       Assessment

## 2021-01-13 NOTE — Assessment & Plan Note (Signed)
Onset 2004 worse since 2016 with neg resp to allergy eval/Rx Orvil Feil nor better on inhalers around 2016 - HR CT chest 10/14/20  Neg x ? Small airways dz  - pred x 6 days 11/16/20 > much better x 6 weeks then cough p ex  - symbicort 80 2bid 01/13/2021 >>>  cough responds short term only to relapse before return while will on full rx for uacs   would point to allergic rhinitis/ asthma or eos bronchitis as alternative dx though note he did not maintain on gerd rx until ov as I rec   However, evidence for asthma is strong enough to warrant trial of symbicort 80 2bid x min of one month and do MCT while on GERD rx x at least 2 weeks if the cough still breaks thru symbicort.  Discussed in detail all the  indications, usual  risks and alternatives  relative to the benefits with patient who agrees to proceed with Rx as outlined.      - The proper method of use, as well as anticipated side effects, of a metered-dose inhaler were discussed and demonstrated to the patient using teach back method.            Each maintenance medication was reviewed in detail including emphasizing most importantly the difference between maintenance and prns and under what circumstances the prns are to be triggered using an action plan format where appropriate.  Total time for H and P, chart review, counseling, reviewing hfa device(s) and generating customized AVS unique to this office visit / same day charting = 28 min

## 2021-01-13 NOTE — Assessment & Plan Note (Signed)
Will ref to see Dr. Burt Knack to consider a biweekly injection of Repatha

## 2021-01-13 NOTE — Addendum Note (Signed)
Addended by: Cassandria Anger on: 01/13/2021 03:42 PM   Modules accepted: Orders

## 2021-01-25 DIAGNOSIS — E039 Hypothyroidism, unspecified: Secondary | ICD-10-CM | POA: Diagnosis not present

## 2021-01-27 ENCOUNTER — Ambulatory Visit (INDEPENDENT_AMBULATORY_CARE_PROVIDER_SITE_OTHER): Payer: Medicare Other | Admitting: Pharmacist

## 2021-01-27 ENCOUNTER — Other Ambulatory Visit: Payer: Self-pay

## 2021-01-27 DIAGNOSIS — E782 Mixed hyperlipidemia: Secondary | ICD-10-CM | POA: Diagnosis not present

## 2021-01-27 DIAGNOSIS — I25810 Atherosclerosis of coronary artery bypass graft(s) without angina pectoris: Secondary | ICD-10-CM | POA: Diagnosis not present

## 2021-01-27 MED ORDER — PRALUENT 75 MG/ML ~~LOC~~ SOAJ: 1.0000 mL | SUBCUTANEOUS | 1 refills | Status: DC

## 2021-01-27 NOTE — Patient Instructions (Addendum)
It was nice meeting you today!  We are going to start a new medication called Praluent which you will inject every 14 days  I will complete the prior authorization and call you when it is approved  We will recheck your cholesterol in late July  If you have any side effects please let us know  The other medication we discussed today is called Leqvio ([place])  Karren Cobble, PharmD, BCACP, [place], [place] [address] [place], [place] [address] Phone: [phone]; Fax: [phone] 01/27/2021 11:03 AM

## 2021-01-27 NOTE — Progress Notes (Signed)
Patient ID: Richard Ellis                 DOB: 1945/10/26                    MRN: [id number]     HPI: Richard Ellis is a 75 y.o. male patient referred to lipid clinic by Dr Burt Knack. PMH is significant for CAD, CVA, HLD, and a patent foramen ovale.  Patient recently had lab work drawn by PCP which showed hyperlipidemia.  Patient presents today in good spirits.  Has a long history of hyperlipidemia with statin intolerances and was also on Repatha for ~1 year before he also developed muscle weakness and exhaustion.  LDL currently 204 and has history of CVA and CAD.  Repatha was effective for patient and brought LDL down <55 however patient believes that was too low a number for him.  Was also on zetia which was discontinued due to lack of efficacy.    Is very physically active.  Runs twice a week for 4-5 miles and bikes on other days of the week.  Does not want to be on any medications that would cause muscle pain and therefore interfere with his exercise routine.  Current Medications: none Intolerances: pitavastatin, rosuvastatin, simvastatin, Repatha (all caused myalgia) Risk Factors: CAD, CVA LDL goal: <55  Exercise: runs 2x a week for 4-5 miles..  boikes 2x a week  Labs:  TC 272, HDL 40, LDL 204, Trigs 136 (01/13/21 - not on any meds)  Past Medical History:  Diagnosis Date  . Allergy    rhinitis  . Anemia    "Hgb always on the low side" (07/02/2013)  . CAD (coronary artery disease)    a. LHC 2/17: pLAD 50, mLAD 95, pRCA 30, dRCA 25, EF 50-55% >> PCI:  3 x 18 mm Resolute DES to mLAD   . Chronic bronchitis ([place])    "not in the last year since I started taking allergy shots" (10/09/2016)  . Chronic lower back pain   . Chronic sinus bradycardia 07/02/2013  . Coronary artery disease 10/15/2016  . Cryptogenic stroke ([place]) 07/02/2013   a. "small ischemic occipital right sided" (07/02/2013)  //  b. Event monitor 10/14: sinus brady  //  c. Carotid US 10/14: bilat ICA 1-39%  . Esophageal stricture    . Exertional angina ([place]) 10/09/2016  . Heart murmur    "as a child" (07/02/2013)  . History of echocardiogram    a. Echo 10/14: EF 60-65%, no RWMA, normal diastolic function  . Hyperlipidemia   . Hypothyroidism   . Nonallopathic lesion of lumbosacral region 06/04/2017  . Nonallopathic lesion of sacral region 06/04/2017  . Nonallopathic lesion of thoracic region 06/04/2017  . Osteoarthritis    "lower back" (10/09/2016)  . PFO (patent foramen ovale)    a. TEE 11/14: mild LVH, EF 60-65%, small PFO, R-L shunt    Current Outpatient Medications on File Prior to Visit  Medication Sig Dispense Refill  . aspirin EC 81 MG tablet Take 1 tablet (81 mg total) by mouth daily. 90 tablet 3  . budesonide-formoterol (SYMBICORT) 80-4.5 MCG/ACT inhaler Take 2 puffs first thing in am and then another 2 puffs about 12 hours later. 1 each 12  . Cholecalciferol 2000 UNITS CAPS Take 2,000 Units by mouth every evening.    . Ferrous Sulfate (IRON) 325 (65 Fe) MG TABS Take 1 tablet by mouth. Taking three times a week    . levothyroxine (SYNTHROID,  LEVOTHROID) 25 MCG tablet 1 tab a day, except for mon-wed-fri 2 tabs a day 90 tablet 3  . predniSONE (DELTASONE) 10 MG tablet Take  4 each am x 2 days,   2 each am x 2 days,  1 each am x 2 days and stop 14 tablet 0  . Turmeric 500 MG CAPS Take 1 capsule by mouth at bedtime.     No current facility-administered medications on file prior to visit.    Allergies  Allergen Reactions  . Niacin Er Other (See Comments)  . Statins Other (See Comments)  . Atorvastatin Other (See Comments)    Muscle weakness  . Crestor [Rosuvastatin] Other (See Comments)    Muscle weakness  . Demerol [Meperidine] Nausea Only  . Niacin And Related Itching, Rash and Other (See Comments)    Palms turned red and itched  . Repatha [Evolocumab] Other (See Comments)    Muscle weakness    Assessment/Plan:  1. Hyperlipidemia - Patient LDL 204 which is above goal of <55.  Aggressive goal  selected due to patient's history of CVA and CAD.  Since patient intolerant to all statins and Repatha and needs significant LDL lowering explained next options would be trial of Praluent or begin enrollment process for Leqvio.  Explained the difference in mechanism of action between [place] and [place] and explained that since [place] is very similar to [place] it is possible he may have the same adverse effects.  Explained to patient that Marion Downer is new and we do not have all the data yet on adverse effects, however we an not able sign him up yet for injections.  After considering options patient is willing to try Praluent.  Discussed storage, site selection, and administration.  Will complete PA and send to pharmacy for patient.  Repeat lipid panel scheduled for 2 months.  Karren Cobble, PharmD, BCACP, [place], [place] [address], [place], [place] [address] Phone: [phone]; Fax: [phone] 01/27/2021 5:01 PM

## 2021-01-30 DIAGNOSIS — R5383 Other fatigue: Secondary | ICD-10-CM | POA: Diagnosis not present

## 2021-01-30 DIAGNOSIS — Z6824 Body mass index (BMI) 24.0-24.9, adult: Secondary | ICD-10-CM | POA: Diagnosis not present

## 2021-01-30 DIAGNOSIS — I25810 Atherosclerosis of coronary artery bypass graft(s) without angina pectoris: Secondary | ICD-10-CM | POA: Diagnosis not present

## 2021-01-30 DIAGNOSIS — E039 Hypothyroidism, unspecified: Secondary | ICD-10-CM | POA: Diagnosis not present

## 2021-02-06 ENCOUNTER — Telehealth: Payer: Self-pay | Admitting: Pharmacist

## 2021-02-06 ENCOUNTER — Telehealth: Payer: Self-pay

## 2021-02-06 DIAGNOSIS — E782 Mixed hyperlipidemia: Secondary | ICD-10-CM

## 2021-02-06 MED ORDER — PRALUENT 75 MG/ML ~~LOC~~ SOAJ: 1.0000 mL | SUBCUTANEOUS | 1 refills | Status: DC

## 2021-02-06 NOTE — Telephone Encounter (Signed)
Spoke with patient and advised PA for Praluent approved through 02/03/22.

## 2021-02-06 NOTE — Telephone Encounter (Signed)
Called and spoke with pt stated that the praluent was approved and that they are already scheduled with labs and to call us back if unaffordable

## 2021-03-30 ENCOUNTER — Ambulatory Visit (INDEPENDENT_AMBULATORY_CARE_PROVIDER_SITE_OTHER): Payer: Medicare Other

## 2021-03-30 ENCOUNTER — Other Ambulatory Visit: Payer: Self-pay

## 2021-03-30 VITALS — BP 120/70 | HR 44 | Temp 98.1°F | Ht 73.0 in | Wt 187.2 lb

## 2021-03-30 DIAGNOSIS — Z0000 Encounter for general adult medical examination without abnormal findings: Secondary | ICD-10-CM

## 2021-03-30 NOTE — Progress Notes (Addendum)
Subjective:   Richard Ellis is a 74 y.o. male who presents for Medicare Annual/Subsequent preventive examination.  Review of Systems     Cardiac Risk Factors include: advanced age (>65men, >13 women);dyslipidemia;family history of premature cardiovascular disease;hypertension;male gender     Objective:    Today's Vitals   03/30/21 0945 03/30/21 0954  BP:  120/70  Pulse:  (!) 44  Temp:  98.1 F (36.7 C)  SpO2:  97%  Weight:  187 lb 3.2 oz (84.9 kg)  Height:  6\' 1"  (1.854 m)  PainSc: 0-No pain 0-No pain   Body mass index is 24.7 kg/m.  Advanced Directives 03/30/2021 10/11/2016 10/09/2016 11/10/2015 07/28/2013 07/02/2013  Does Patient Have a Medical Advance Directive? Yes Yes Yes Yes Patient has advance directive, copy not in chart Patient has advance directive, copy not in chart  Type of Advance Directive Living will;Healthcare Power of [place];Living will Healthcare Power of [place];Living will -  Does patient want to make changes to medical advance directive? No - Patient declined - No - Patient declined - - -  Copy of [place] in Chart? No - copy requested - No - copy requested No - copy requested - Copy requested from family    Current Medications (verified) Outpatient Encounter Medications as of 03/30/2021  Medication Sig   Alirocumab (PRALUENT) 75 MG/ML SOAJ Inject 1 mL into the skin every 14 (fourteen) days.   aspirin EC 81 MG tablet Take 1 tablet (81 mg total) by mouth daily.   budesonide-formoterol (SYMBICORT) 80-4.5 MCG/ACT inhaler Take 2 puffs first thing in am and then another 2 puffs about 12 hours later.   Cholecalciferol 2000 UNITS CAPS Take 2,000 Units by mouth every evening.   Ferrous Sulfate (IRON) 325 (65 Fe) MG TABS Take 1 tablet by mouth. Taking three times a week   levothyroxine (SYNTHROID, LEVOTHROID) 25 MCG tablet 1 tab a day, except for mon-wed-fri 2 tabs a day   predniSONE  (DELTASONE) 10 MG tablet Take  4 each am x 2 days,   2 each am x 2 days,  1 each am x 2 days and stop   Turmeric 500 MG CAPS Take 1 capsule by mouth at bedtime.   No facility-administered encounter medications on file as of 03/30/2021.    Allergies (verified) Niacin er, Statins, Atorvastatin, Crestor [rosuvastatin], Demerol [meperidine], Niacin and related, and Repatha [evolocumab]   History: Past Medical History:  Diagnosis Date   Allergy    rhinitis   Anemia    "Hgb always on the low side" (07/02/2013)   CAD (coronary artery disease)    a. LHC 2/17: pLAD 50, mLAD 95, pRCA 30, dRCA 25, EF 50-55% >> PCI:  3 x 18 mm Resolute DES to mLAD    Chronic bronchitis ([place])    "not in the last year since I started taking allergy shots" (10/09/2016)   Chronic lower back pain    Chronic sinus bradycardia 07/02/2013   Coronary artery disease 10/15/2016   Cryptogenic stroke ([place]) 07/02/2013   a. "small ischemic occipital right sided" (07/02/2013)  //  b. Event monitor 10/14: sinus brady  //  c. Carotid US 10/14: bilat ICA 1-39%   Esophageal stricture    Exertional angina ([place]) 10/09/2016   Heart murmur    "as a child" (07/02/2013)   History of echocardiogram    a. Echo 10/14: EF 60-65%, no RWMA, normal diastolic function   Hyperlipidemia  Hypothyroidism    Nonallopathic lesion of lumbosacral region 06/04/2017   Nonallopathic lesion of sacral region 06/04/2017   Nonallopathic lesion of thoracic region 06/04/2017   Osteoarthritis    "lower back" (10/09/2016)   PFO (patent foramen ovale)    a. TEE 11/14: mild LVH, EF 60-65%, small PFO, R-L shunt   Past Surgical History:  Procedure Laterality Date   CARDIAC CATHETERIZATION N/A 11/10/2015   Procedure: Left Heart Cath and Coronary Angiography;  Surgeon: [name], MD;  Location: [facility];  Service: Cardiovascular;  Laterality: N/A;   CARDIAC CATHETERIZATION N/A 11/10/2015   Procedure: Coronary Stent Intervention;  Surgeon: [name], MD;  Location: [facility];  Service: Cardiovascular;  Laterality: N/A;  mid lad  3.0x18 resolute   CARDIAC CATHETERIZATION  10/09/2016   "scheduled OHS for tomorrow" (10/09/2016)   CARDIAC CATHETERIZATION N/A 10/09/2016   Procedure: Left Heart Cath and Coronary Angiography;  Surgeon: [name], MD;  Location: [facility];  Service: Cardiovascular;  Laterality: N/A;   CARDIAC CATHETERIZATION N/A 10/09/2016   Procedure: Coronary Balloon Angioplasty;  Surgeon: [name], MD;  Location: [facility];  Service: Cardiovascular;  Laterality: N/A;   CORONARY ARTERY BYPASS GRAFT N/A 10/15/2016   Procedure: CORONARY ARTERY BYPASS GRAFTING (CABG), ON PUMP, TIMES TWO, USING LEFT INTERNAL MAMMARY ARTERY AND RIGHT GREATER SAPHENOUS VEIN HARVESTED ENDOSCOPICALLY;  Surgeon: Ivin Poot, MD;  Location: [facility];  Service: Open Heart Surgery;  Laterality: N/A;  LIMA to LAD, SVG to DIAGONAL   ESOPHAGOGASTRODUODENOSCOPY N/A 10/26/2012   Procedure: ESOPHAGOGASTRODUODENOSCOPY (EGD);  Surgeon: Winfield Cunas., MD;  Location: [facility];  Service: Endoscopy;  Laterality: N/A;   ESOPHAGOGASTRODUODENOSCOPY N/A 07/08/2013   Procedure: ESOPHAGOGASTRODUODENOSCOPY (EGD);  Surgeon: Winfield Cunas., MD;  Location: [facility];  Service: Endoscopy;  Laterality: N/A;   INNER EAR SURGERY Left 1957   "related to ear infection"   REPAIR OF PATENT FORAMEN OVALE N/A 10/15/2016   Procedure: REPAIR OF PATENT FORAMEN OVALE;  Surgeon: Ivin Poot, MD;  Location: [facility];  Service: Open Heart Surgery;  Laterality: N/A;   SAVORY DILATION N/A 07/08/2013   Procedure: SAVORY DILATION;  Surgeon: Winfield Cunas., MD;  Location: [facility];  Service: Endoscopy;  Laterality: N/A;   TEE WITHOUT CARDIOVERSION N/A 07/28/2013   Procedure: TRANSESOPHAGEAL ECHOCARDIOGRAM (TEE);  Surgeon: Pixie Casino, MD;  Location: [facility] ENDOSCOPY;  Service: Cardiovascular;  Laterality: N/A;   TEE WITHOUT  CARDIOVERSION N/A 10/15/2016   Procedure: TRANSESOPHAGEAL ECHOCARDIOGRAM (TEE);  Surgeon: Ivin Poot, MD;  Location: [place] [facility];  Service: Open Heart Surgery;  Laterality: N/A;   TONSILLECTOMY  1950's   Family History  Problem Relation Age of Onset   Coronary artery disease Other        male first degree relative <60   Heart disease Father        heart attack   COPD Mother    Heart disease Paternal Uncle    Colon cancer Neg Hx    Social History   Socioeconomic History   Marital status: Married    Spouse name: Not on file   Number of children: 1   Years of education: Not on file   Highest education level: Not on file  Occupational History   Occupation: Retired  Tobacco Use   Smoking status: Never   Smokeless tobacco: Never  Vaping Use   Vaping Use: Never used  Substance and Sexual Activity   Alcohol  use: Yes    Alcohol/week: 8.0 standard drinks    Types: 4 Glasses of wine, 4 Cans of beer per week    Comment: weekly   Drug use: No   Sexual activity: Yes  Other Topics Concern   Not on file  Social History Narrative   Not on file   Social Determinants of Health   Financial Resource Strain: Low Risk    Difficulty of Paying Living Expenses: Not hard at all  Food Insecurity: No Food Insecurity   Worried About Charity fundraiser in the Last Year: Never true   Ran Out of Food in the Last Year: Never true  Transportation Needs: No Transportation Needs   Lack of Transportation (Medical): No   Lack of Transportation (Non-Medical): No  Physical Activity: Sufficiently Active   Days of Exercise per Week: 7 days   Minutes of Exercise per Session: 30 min  Stress: No Stress Concern Present   Feeling of Stress : Not at all  Social Connections: Socially Integrated   Frequency of Communication with Friends and Family: More than three times a week   Frequency of Social Gatherings with Friends and Family: Twice a week   Attends Religious Services: More than 4 times per year    Active Member of [employer] or Organizations: Yes   Attends Music therapist: More than 4 times per year   Marital Status: Married    Tobacco Counseling Counseling given: Not Answered   Clinical Intake:  Pre-visit preparation completed: Yes  Pain : No/denies pain Pain Score: 0-No pain     BMI - recorded: 24.7 Nutritional Status: BMI of 19-24  Normal Nutritional Risks: None Diabetes: No  How often do you need to have someone help you when you read instructions, pamphlets, or other written materials from your doctor or pharmacy?: 1 - Never What is the last grade level you completed in school?: Graduate School  Diabetic? no  Interpreter Needed?: No  Information entered by :: Lisette Abu, LPN   Activities of Daily Living In your present state of health, do you have any difficulty performing the following activities: 03/30/2021 01/12/2021  Hearing? N N  Vision? N N  Difficulty concentrating or making decisions? N N  Walking or climbing stairs? N N  Dressing or bathing? N N  Doing errands, shopping? N N  Preparing Food and eating ? N -  Using the Toilet? N -  In the past six months, have you accidently leaked urine? N -  Do you have problems with loss of bowel control? N -  Managing your Medications? N -  Managing your Finances? N -  Housekeeping or managing your Housekeeping? N -  Some recent data might be hidden    Patient Care Team: Plotnikov, Evie Lacks, MD as PCP - Cyndia Diver, MD as PCP - Cardiology (Cardiology) Rana Snare, MD (Inactive) as Consulting Physician (Urology)  Indicate any recent Medical Services you may have received from other than [facility] providers in the past year (date may be approximate).     Assessment:   This is a routine wellness examination for Richard Ellis.  Hearing/Vision screen No results found.  Dietary issues and exercise activities discussed: Current Exercise Habits: Home exercise routine;Structured exercise  class, Type of exercise: walking;Other - see comments;strength training/weights (running, swimming, free weights, bicycle), Time (Minutes): 30, Frequency (Times/Week): 7, Weekly Exercise (Minutes/Week): 210, Intensity: Moderate, Exercise limited by: None identified   Goals Addressed  This Visit's Progress    Patient Stated       My goal for this year is to compete in the state and national finals.      Depression Screen PHQ 2/9 Scores 03/30/2021 01/12/2021 03/04/2017 12/03/2016 09/29/2014  PHQ - 2 Score 0 0 0 0 0  PHQ- 9 Score - 0 - - -    Fall Risk Fall Risk  03/30/2021 01/12/2021 11/29/2016 09/29/2014  Falls in the past year? 0 0 No Yes  Number falls in past yr: 0 0 - 1  Injury with Fall? 0 0 - Yes  Risk for fall due to : No Fall Risks No Fall Risks - -  Follow up Falls evaluation completed - - -    FALL RISK PREVENTION PERTAINING TO THE HOME:  Any stairs in or around the home? Yes  If so, are there any without handrails? No  Home free of loose throw rugs in walkways, pet beds, electrical cords, etc? Yes  Adequate lighting in your home to reduce risk of falls? Yes   ASSISTIVE DEVICES UTILIZED TO PREVENT FALLS:  Life alert? Yes  (Apple phone) Use of a cane, walker or w/c? No  Grab bars in the bathroom? No  Shower chair or bench in shower? No  Elevated toilet seat or a handicapped toilet? No   TIMED UP AND GO:  Was the test performed? Yes .  Length of time to ambulate 10 feet: 6 sec.   Gait steady and fast without use of assistive device  Cognitive Function:Normal cognitive status assessed by direct observation by this Nurse Health Advisor. No abnormalities found.          Immunizations Immunization History  Administered Date(s) Administered   DTaP 02/03/2011   Fluad Quad(high Dose 65+) 05/17/2020   Influenza, High Dose Seasonal PF 07/20/2016, 05/29/2017, 05/29/2018, 05/28/2019   Influenza, Quadrivalent, Recombinant, Inj, Pf 05/24/2020    Influenza-Unspecified 06/23/2015   PFIZER(Purple Top)SARS-COV-2 Vaccination 10/08/2019, 10/29/2019, 07/06/2020   Pneumococcal Conjugate-13 04/19/2016   Pneumococcal Polysaccharide-23 08/11/2012    TDAP status: Due, Education has been provided regarding the importance of this vaccine. Advised may receive this vaccine at local pharmacy or Health Dept. Aware to provide a copy of the vaccination record if obtained from local pharmacy or Health Dept. Verbalized acceptance and understanding.  Flu Vaccine status: Up to date  Pneumococcal vaccine status: Up to date  Covid-19 vaccine status: Completed vaccines  Qualifies for Shingles Vaccine? Yes   Zostavax completed Yes   Shingrix Completed?: No.    Education has been provided regarding the importance of this vaccine. Patient has been advised to call insurance company to determine out of pocket expense if they have not yet received this vaccine. Advised may also receive vaccine at local pharmacy or Health Dept. Verbalized acceptance and understanding.  Screening Tests Health Maintenance  Topic Date Due   Hepatitis C Screening  Never done   TETANUS/TDAP  Never done   Zoster Vaccines- Shingrix (1 of 2) Never done   COVID-19 Vaccine (4 - Booster for Pfizer series) 10/06/2020   INFLUENZA VACCINE  04/17/2021   COLONOSCOPY (Pts 45-36yrs Insurance coverage will need to be confirmed)  04/16/2022   PNA vac Low Risk Adult  Completed   HPV VACCINES  Aged Out    Health Maintenance  Health Maintenance Due  Topic Date Due   Hepatitis C Screening  Never done   TETANUS/TDAP  Never done   Zoster Vaccines- Shingrix (1 of 2) Never done  COVID-19 Vaccine (4 - Booster for Pfizer series) 10/06/2020    Colorectal cancer screening: Type of screening: Colonoscopy. Completed 04/16/2012. Repeat every 10 years  Lung Cancer Screening: (Low Dose CT Chest recommended if Age 52-80 years, 30 pack-year currently smoking OR have quit w/in 15years.) does not qualify.    Lung Cancer Screening Referral: no  Additional Screening:  Hepatitis C Screening: does qualify; Completed no  Vision Screening: Recommended annual ophthalmology exams for early detection of glaucoma and other disorders of the eye. Is the patient up to date with their annual eye exam?  Yes  Who is the provider or what is the name of the office in which the patient attends annual eye exams? [facility] If pt is not established with a provider, would they like to be referred to a provider to establish care? No .   Dental Screening: Recommended annual dental exams for proper oral hygiene  Community Resource Referral / Chronic Care Management: CRR required this visit?  No   CCM required this visit?  No      Plan:     I have personally reviewed and noted the following in the patient's chart:   Medical and social history Use of alcohol, tobacco or illicit drugs  Current medications and supplements including opioid prescriptions. Patient is not currently taking opioid prescriptions. Functional ability and status Nutritional status Physical activity Advanced directives List of other physicians Hospitalizations, surgeries, and ER visits in previous 12 months Vitals Screenings to include cognitive, depression, and falls Referrals and appointments  In addition, I have reviewed and discussed with patient certain preventive protocols, quality metrics, and best practice recommendations. A written personalized care plan for preventive services as well as general preventive health recommendations were provided to patient.     Sheral Flow, LPN   9/37/1696   Nurse Notes: n/a  Medical screening examination/treatment/procedure(s) were performed by non-physician practitioner and as supervising physician I was immediately available for consultation/collaboration.  I agree with above. Lew Dawes, MD

## 2021-03-30 NOTE — Patient Instructions (Signed)
[name] , Thank you for taking time to come for your Medicare Wellness Visit. I appreciate your ongoing commitment to your health goals. Please review the following plan we discussed and let me know if I can assist you in the future.   Screening recommendations/referrals: Colonoscopy: last done 04/16/2012; due every 10 years  Recommended yearly ophthalmology/optometry visit for glaucoma screening and checkup Recommended yearly dental visit for hygiene and checkup  Vaccinations: Influenza vaccine: 05/24/2020 Pneumococcal vaccine: 08/11/2012, 04/19/2016 Tdap vaccine: last done 11/18/2006 Shingles vaccine: Zoster 11/18/2006 Covid-19: 10/08/2019, 10/29/2019, 07/06/2020  Advanced directives: Please bring a copy of your health care power of attorney and living will to the office at your convenience.  Conditions/risks identified: Client understands the importance of follow-up with providers by attending scheduled visits and discussed goals to eat healthier, increase physical activity, exercise the brain, socialize more,  get enough rest and make time for laughter.  Next appointment: Please schedule your next Medicare Wellness Visit with your Nurse Health Advisor in 1 year by calling [phone].  Preventive Care 22 Years and Older, Male Preventive care refers to lifestyle choices and visits with your health care provider that can promote health and wellness. What does preventive care include? A yearly physical exam. This is also called an annual well check. Dental exams once or twice a year. Routine eye exams. Ask your health care provider how often you should have your eyes checked. Personal lifestyle choices, including: Daily care of your teeth and gums. Regular physical activity. Eating a healthy diet. Avoiding tobacco and drug use. Limiting alcohol use. Practicing safe sex. Taking low doses of aspirin every day. Taking vitamin and mineral supplements as recommended by your health care  provider. What happens during an annual well check? The services and screenings done by your health care provider during your annual well check will depend on your age, overall health, lifestyle risk factors, and family history of disease. Counseling  Your health care provider may ask you questions about your: Alcohol use. Tobacco use. Drug use. Emotional well-being. Home and relationship well-being. Sexual activity. Eating habits. History of falls. Memory and ability to understand (cognition). Work and work Statistician. Screening  You may have the following tests or measurements: Height, weight, and BMI. Blood pressure. Lipid and cholesterol levels. These may be checked every 5 years, or more frequently if you are over 33 years old. Skin check. Lung cancer screening. You may have this screening every year starting at age 76 if you have a 30-pack-year history of smoking and currently smoke or have quit within the past 15 years. Fecal occult blood test (FOBT) of the stool. You may have this test every year starting at age 75. Flexible sigmoidoscopy or colonoscopy. You may have a sigmoidoscopy every 5 years or a colonoscopy every 10 years starting at age 16. Prostate cancer screening. Recommendations will vary depending on your family history and other risks. Hepatitis C blood test. Hepatitis B blood test. Sexually transmitted disease (STD) testing. Diabetes screening. This is done by checking your blood sugar (glucose) after you have not eaten for a while (fasting). You may have this done every 1-3 years. Abdominal aortic aneurysm (AAA) screening. You may need this if you are a current or former smoker. Osteoporosis. You may be screened starting at age 50 if you are at high risk. Talk with your health care provider about your test results, treatment options, and if necessary, the need for more tests. Vaccines  Your health care provider may recommend certain  vaccines, such  as: Influenza vaccine. This is recommended every year. Tetanus, diphtheria, and acellular pertussis (Tdap, Td) vaccine. You may need a Td booster every 10 years. Zoster vaccine. You may need this after age 67. Pneumococcal 13-valent conjugate (PCV13) vaccine. One dose is recommended after age 82. Pneumococcal polysaccharide (PPSV23) vaccine. One dose is recommended after age 14. Talk to your health care provider about which screenings and vaccines you need and how often you need them. This information is not intended to replace advice given to you by your health care provider. Make sure you discuss any questions you have with your health care provider. Document Released: 09/30/2015 Document Revised: 05/23/2016 Document Reviewed: 07/05/2015 Elsevier Interactive Patient Education  2017 [place] Prevention in the Home Falls can cause injuries. They can happen to people of all ages. There are many things you can do to make your home safe and to help prevent falls. What can I do on the outside of my home? Regularly fix the edges of walkways and driveways and fix any cracks. Remove anything that might make you trip as you walk through a door, such as a raised step or threshold. Trim any bushes or trees on the path to your home. Use bright outdoor lighting. Clear any walking paths of anything that might make someone trip, such as rocks or tools. Regularly check to see if handrails are loose or broken. Make sure that both sides of any steps have handrails. Any raised decks and porches should have guardrails on the edges. Have any leaves, snow, or ice cleared regularly. Use sand or salt on walking paths during winter. Clean up any spills in your garage right away. This includes oil or grease spills. What can I do in the bathroom? Use night lights. Install grab bars by the toilet and in the tub and shower. Do not use towel bars as grab bars. Use non-skid mats or decals in the tub or  shower. If you need to sit down in the shower, use a plastic, non-slip stool. Keep the floor dry. Clean up any water that spills on the floor as soon as it happens. Remove soap buildup in the tub or shower regularly. Attach bath mats securely with double-sided non-slip rug tape. Do not have throw rugs and other things on the floor that can make you trip. What can I do in the bedroom? Use night lights. Make sure that you have a light by your bed that is easy to reach. Do not use any sheets or blankets that are too big for your bed. They should not hang down onto the floor. Have a firm chair that has side arms. You can use this for support while you get dressed. Do not have throw rugs and other things on the floor that can make you trip. What can I do in the kitchen? Clean up any spills right away. Avoid walking on wet floors. Keep items that you use a lot in easy-to-reach places. If you need to reach something above you, use a strong step stool that has a grab bar. Keep electrical cords out of the way. Do not use floor polish or wax that makes floors slippery. If you must use wax, use non-skid floor wax. Do not have throw rugs and other things on the floor that can make you trip. What can I do with my stairs? Do not leave any items on the stairs. Make sure that there are handrails on both sides of the stairs  and use them. Fix handrails that are broken or loose. Make sure that handrails are as long as the stairways. Check any carpeting to make sure that it is firmly attached to the stairs. Fix any carpet that is loose or worn. Avoid having throw rugs at the top or bottom of the stairs. If you do have throw rugs, attach them to the floor with carpet tape. Make sure that you have a light switch at the top of the stairs and the bottom of the stairs. If you do not have them, ask someone to add them for you. What else can I do to help prevent falls? Wear shoes that: Do not have high heels. Have  rubber bottoms. Are comfortable and fit you well. Are closed at the toe. Do not wear sandals. If you use a stepladder: Make sure that it is fully opened. Do not climb a closed stepladder. Make sure that both sides of the stepladder are locked into place. Ask someone to hold it for you, if possible. Clearly mark and make sure that you can see: Any grab bars or handrails. First and last steps. Where the edge of each step is. Use tools that help you move around (mobility aids) if they are needed. These include: Canes. Walkers. Scooters. Crutches. Turn on the lights when you go into a dark area. Replace any light bulbs as soon as they burn out. Set up your furniture so you have a clear path. Avoid moving your furniture around. If any of your floors are uneven, fix them. If there are any pets around you, be aware of where they are. Review your medicines with your doctor. Some medicines can make you feel dizzy. This can increase your chance of falling. Ask your doctor what other things that you can do to help prevent falls. This information is not intended to replace advice given to you by your health care provider. Make sure you discuss any questions you have with your health care provider. Document Released: 06/30/2009 Document Revised: 02/09/2016 Document Reviewed: 10/08/2014 Elsevier Interactive Patient Education  2017 [employer].

## 2021-04-11 ENCOUNTER — Ambulatory Visit (INDEPENDENT_AMBULATORY_CARE_PROVIDER_SITE_OTHER): Payer: Medicare Other | Admitting: Cardiovascular Disease

## 2021-04-11 ENCOUNTER — Other Ambulatory Visit: Payer: Medicare Other | Admitting: *Deleted

## 2021-04-11 ENCOUNTER — Other Ambulatory Visit: Payer: Self-pay

## 2021-04-11 ENCOUNTER — Encounter: Payer: Self-pay | Admitting: Cardiovascular Disease

## 2021-04-11 VITALS — BP 120/80 | HR 54 | Ht 73.0 in | Wt 191.0 lb

## 2021-04-11 DIAGNOSIS — I2510 Atherosclerotic heart disease of native coronary artery without angina pectoris: Secondary | ICD-10-CM

## 2021-04-11 DIAGNOSIS — E782 Mixed hyperlipidemia: Secondary | ICD-10-CM

## 2021-04-11 DIAGNOSIS — I25810 Atherosclerosis of coronary artery bypass graft(s) without angina pectoris: Secondary | ICD-10-CM

## 2021-04-11 LAB — HEPATIC FUNCTION PANEL
ALT: 19 IU/L (ref 0–44)
AST: 22 IU/L (ref 0–40)
Albumin: 4.4 g/dL (ref 3.7–4.7)
Alkaline Phosphatase: 49 IU/L (ref 44–121)
Bilirubin Total: 0.3 mg/dL (ref 0.0–1.2)
Bilirubin, Direct: <0.1 mg/dL (ref 0.00–0.40)
Total Protein: 6.4 g/dL (ref 6.0–8.5)

## 2021-04-11 LAB — LIPID PANEL
Chol/HDL Ratio: 7.4 ratio — ABNORMAL HIGH (ref 0.0–5.0)
Cholesterol, Total: 309 mg/dL — ABNORMAL HIGH (ref 100–199)
HDL: 42 mg/dL (ref >39)
LDL Chol Calc (NIH): 206 mg/dL — ABNORMAL HIGH (ref 0–99)
Triglycerides: 304 mg/dL — ABNORMAL HIGH (ref 0–149)
VLDL Cholesterol Cal: 61 mg/dL — ABNORMAL HIGH (ref 5–40)

## 2021-04-11 NOTE — Progress Notes (Signed)
Cardiology Office Note:    Date:  04/11/2021   ID:  [name], DOB 1945/10/27, MRN [id number]  PCP:  Cassandria Anger, MD   [facility] Providers Cardiologist:  [name], MD     Referring MD: Cassandria Anger, MD   Chief Complaint  Patient presents with   Coronary Artery Disease    History of Present Illness:    Richard Ellis is a 75 y.o. male with a hx of: Coronary artery disease Cath 2/17: Severe mid LAD stenosis >> DES to the mid LAD Status post CABG 2018 (LIMA-LAD, SVG-D1) Patent foramen ovale status post surgical closure Prior CVA Cryptogenic CVA 2014 >> PFO with R>L shunt noted; treated with Plavix Hyperlipidemia Intol of statins, PCSK9i  >> Diet Rx Hypothyroidism Anemia Bradycardia -no beta-blocker    The patient is here alone today. He is doing well. He is running 2 days per week and limits this because of back problems. He is also lifting weights, riding a bike, and walking. Today, he denies symptoms of palpitations, chest pain, shortness of breath, orthopnea, PND, lower extremity edema, dizziness, or syncope. He does admit to generalized fatigue.   Past Medical History:  Diagnosis Date   Allergy    rhinitis   Anemia    "Hgb always on the low side" (07/02/2013)   CAD (coronary artery disease)    a. LHC 2/17: pLAD 50, mLAD 95, pRCA 30, dRCA 25, EF 50-55% >> PCI:  3 x 18 mm Resolute DES to mLAD    Chronic bronchitis ([place])    "not in the last year since I started taking allergy shots" (10/09/2016)   Chronic lower back pain    Chronic sinus bradycardia 07/02/2013   Coronary artery disease 10/15/2016   Cryptogenic stroke ([place]) 07/02/2013   a. "small ischemic occipital right sided" (07/02/2013)  //  b. Event monitor 10/14: sinus brady  //  c. Carotid US 10/14: bilat ICA 1-39%   Esophageal stricture    Exertional angina ([place]) 10/09/2016   Heart murmur    "as a child" (07/02/2013)   History of echocardiogram    a. Echo 10/14: EF 60-65%, no  RWMA, normal diastolic function   Hyperlipidemia    Hypothyroidism    Nonallopathic lesion of lumbosacral region 06/04/2017   Nonallopathic lesion of sacral region 06/04/2017   Nonallopathic lesion of thoracic region 06/04/2017   Osteoarthritis    "lower back" (10/09/2016)   PFO (patent foramen ovale)    a. TEE 11/14: mild LVH, EF 60-65%, small PFO, R-L shunt    Past Surgical History:  Procedure Laterality Date   CARDIAC CATHETERIZATION N/A 11/10/2015   Procedure: Left Heart Cath and Coronary Angiography;  Surgeon: [name], MD;  Location: [place] [facility];  Service: Cardiovascular;  Laterality: N/A;   CARDIAC CATHETERIZATION N/A 11/10/2015   Procedure: Coronary Stent Intervention;  Surgeon: [name], MD;  Location: [place] [facility];  Service: Cardiovascular;  Laterality: N/A;  mid lad  3.0x18 resolute   CARDIAC CATHETERIZATION  10/09/2016   "scheduled OHS for tomorrow" (10/09/2016)   CARDIAC CATHETERIZATION N/A 10/09/2016   Procedure: Left Heart Cath and Coronary Angiography;  Surgeon: [name], MD;  Location: [place] [facility];  Service: Cardiovascular;  Laterality: N/A;   CARDIAC CATHETERIZATION N/A 10/09/2016   Procedure: Coronary Balloon Angioplasty;  Surgeon: [name], MD;  Location: [place] [facility];  Service: Cardiovascular;  Laterality: N/A;   CORONARY ARTERY BYPASS GRAFT N/A 10/15/2016   Procedure: CORONARY  ARTERY BYPASS GRAFTING (CABG), ON PUMP, TIMES TWO, USING LEFT INTERNAL MAMMARY ARTERY AND RIGHT GREATER SAPHENOUS VEIN HARVESTED ENDOSCOPICALLY;  Surgeon: Ivin Poot, MD;  Location: [place];  Service: Open Heart Surgery;  Laterality: N/A;  LIMA to LAD, SVG to DIAGONAL   ESOPHAGOGASTRODUODENOSCOPY N/A 10/26/2012   Procedure: ESOPHAGOGASTRODUODENOSCOPY (EGD);  Surgeon: Winfield Cunas., MD;  Location: [facility]OS[facility];  Service: Endoscopy;  Laterality: N/A;   ESOPHAGOGASTRODUODENOSCOPY N/A 07/08/2013   Procedure: ESOPHAGOGASTRODUODENOSCOPY (EGD);  Surgeon:  Winfield Cunas., MD;  Location: [facility];  Service: Endoscopy;  Laterality: N/A;   INNER EAR SURGERY Left 1957   "related to ear infection"   REPAIR OF PATENT FORAMEN OVALE N/A 10/15/2016   Procedure: REPAIR OF PATENT FORAMEN OVALE;  Surgeon: Ivin Poot, MD;  Location: [place];  Service: Open Heart Surgery;  Laterality: N/A;   SAVORY DILATION N/A 07/08/2013   Procedure: SAVORY DILATION;  Surgeon: Winfield Cunas., MD;  Location: [facility];  Service: Endoscopy;  Laterality: N/A;   TEE WITHOUT CARDIOVERSION N/A 07/28/2013   Procedure: TRANSESOPHAGEAL ECHOCARDIOGRAM (TEE);  Surgeon: Pixie Casino, MD;  Location: [facility] ENDOSCOPY;  Service: Cardiovascular;  Laterality: N/A;   TEE WITHOUT CARDIOVERSION N/A 10/15/2016   Procedure: TRANSESOPHAGEAL ECHOCARDIOGRAM (TEE);  Surgeon: Ivin Poot, MD;  Location: [place];  Service: Open Heart Surgery;  Laterality: N/A;   TONSILLECTOMY  1950's    Current Medications: Current Meds  Medication Sig   Alirocumab (PRALUENT) 75 MG/ML SOAJ Inject 1 mL into the skin every 14 (fourteen) days.   aspirin EC 81 MG tablet Take 1 tablet (81 mg total) by mouth daily.   budesonide-formoterol (SYMBICORT) 80-4.5 MCG/ACT inhaler Take 2 puffs first thing in am and then another 2 puffs about 12 hours later.   Cholecalciferol 2000 UNITS CAPS Take 2,000 Units by mouth every evening.   Ferrous Sulfate (IRON) 325 (65 Fe) MG TABS Take 1 tablet by mouth. Taking three times a week   levothyroxine (SYNTHROID) 25 MCG tablet Take 25 mcg by mouth daily before breakfast. Per patient taking 2 tablets on Monday-Saturday, do not take on Sunday   Turmeric 500 MG CAPS Take 1 capsule by mouth at bedtime.     Allergies:   Niacin er, Statins, Atorvastatin, Crestor [rosuvastatin], Demerol [meperidine], Niacin and related, and Repatha [evolocumab]   Social History   Socioeconomic History   Marital status: Married    Spouse name: Not on file   Number of children: 1   Years  of education: Not on file   Highest education level: Not on file  Occupational History   Occupation: Retired  Tobacco Use   Smoking status: Never   Smokeless tobacco: Never  Vaping Use   Vaping Use: Never used  Substance and Sexual Activity   Alcohol use: Yes    Alcohol/week: 8.0 standard drinks    Types: 4 Glasses of wine, 4 Cans of beer per week    Comment: weekly   Drug use: No   Sexual activity: Yes  Other Topics Concern   Not on file  Social History Narrative   Not on file   Social Determinants of Health   Financial Resource Strain: Low Risk    Difficulty of Paying Living Expenses: Not hard at all  Food Insecurity: No Food Insecurity   Worried About Charity fundraiser in the Last Year: Never true   Ran Out of Food in the Last Year: Never true  Transportation Needs: No Transportation  Needs   Lack of Transportation (Medical): No   Lack of Transportation (Non-Medical): No  Physical Activity: Sufficiently Active   Days of Exercise per Week: 7 days   Minutes of Exercise per Session: 30 min  Stress: No Stress Concern Present   Feeling of Stress : Not at all  Social Connections: Socially Integrated   Frequency of Communication with Friends and Family: More than three times a week   Frequency of Social Gatherings with Friends and Family: Twice a week   Attends Religious Services: More than 4 times per year   Active Member of [employer] or Organizations: Yes   Attends Music therapist: More than 4 times per year   Marital Status: Married     Family History: The patient's family history includes COPD in his mother; Coronary artery disease in an other family member; Heart disease in his father and paternal uncle. There is no history of Colon cancer.  ROS:   Please see the history of present illness.    All other systems reviewed and are negative.  EKGs/Labs/Other Studies Reviewed:    EKG:  EKG is ordered today.  The ekg ordered today demonstrates sinus  bradycardia 54 bpm, age-indeterminate inferior infarct with no change from prior.  Recent Labs: 01/13/2021: ALT 16; BUN 22; Creatinine, Ser 1.10; Hemoglobin 13.6; Platelets 213.0; Potassium 4.2; Sodium 139; TSH 3.33  Recent Lipid Panel    Component Value Date/Time   CHOL 272 (H) 01/13/2021 0809   CHOL 230 (H) 11/09/2014 1200   TRIG 136.0 01/13/2021 0809   TRIG 152 (H) 11/09/2014 1200   TRIG 228 (HH) 07/30/2006 0741   HDL 40.50 01/13/2021 0809   HDL 64 11/09/2014 1200   CHOLHDL 7 01/13/2021 0809   VLDL 27.2 01/13/2021 0809   LDLCALC 204 (H) 01/13/2021 0809   LDLCALC 136 (H) 11/09/2014 1200   LDLDIRECT 122.0 05/09/2015 1006     Risk Assessment/Calculations:           Physical Exam:    VS:  BP 120/80   Pulse (!) 54   Ht '6\' 1"'$  (1.854 m)   Wt 191 lb (86.6 kg)   SpO2 95%   BMI 25.20 kg/m     Wt Readings from Last 3 Encounters:  04/11/21 191 lb (86.6 kg)  03/30/21 187 lb 3.2 oz (84.9 kg)  01/13/21 192 lb 12.8 oz (87.5 kg)     GEN:  Well nourished, well developed in no acute distress HEENT: Normal NECK: No JVD; No carotid bruits LYMPHATICS: No lymphadenopathy CARDIAC: Bradycardic and regular, no murmurs, rubs, gallops RESPIRATORY:  Clear to auscultation without rales, wheezing or rhonchi  ABDOMEN: Soft, non-tender, non-distended MUSCULOSKELETAL:  No edema; No deformity  SKIN: Warm and dry NEUROLOGIC:  Alert and oriented x 3 PSYCHIATRIC:  Normal affect   ASSESSMENT:    1. Mixed hyperlipidemia   2. Coronary artery disease involving native coronary artery of native heart without angina pectoris    PLAN:    In order of problems listed above:  The patient has been intolerant to Repatha and statin drugs.  He was seen in the lipid clinic and Praluent was prescribed.  The patient's out-of-pocket cost was $600 per month.  I will reach back out to our cardiology pharmacist to see if there is a program that might reduce his cost.  Otherwise he will have to continue with  diet and lifestyle modification.  However, his LDL cholesterol is 204. Doing well, staying active with no cardiac symptoms.  Continue aspirin for antiplatelet therapy.  No beta-blocker because of bradycardia.  Lipid-lowering as detailed above.        Medication Adjustments/Labs and Tests Ordered: Current medicines are reviewed at length with the patient today.  Concerns regarding medicines are outlined above.  Orders Placed This Encounter  Procedures   EKG 12-Lead    No orders of the defined types were placed in this encounter.   Patient Instructions  Medication Instructions:  Your physician recommends that you continue on your current medications as directed. Please refer to the Current Medication list given to you today.  *If you need a refill on your cardiac medications before your next appointment, please call your pharmacy*   Lab Work: None Ordered If you have labs (blood work) drawn today and your tests are completely normal, you will receive your results only by: [place] (if you have MyChart) OR A paper copy in the mail If you have any lab test that is abnormal or we need to change your treatment, we will call you to review the results.   Testing/Procedures: None Ordered   Follow-Up: At [facility], you and your health needs are our priority.  As part of our continuing mission to provide you with exceptional heart care, we have created designated Provider Care Teams.  These Care Teams include your primary Cardiologist (physician) and Advanced Practice Providers (APPs -  Physician Assistants and Nurse Practitioners) who all work together to provide you with the care you need, when you need it.   Your next appointment:   1 year(s)  The format for your next appointment:   In Person  Provider:   You may see [name], MD or one of the following Advanced Practice Providers on your designated Care Team:   Richardson Dopp, PA-C Robbie Lis, [place]     Signed, [name], MD  04/11/2021 3:29 PM    [place]

## 2021-04-11 NOTE — Patient Instructions (Signed)
Medication Instructions:  Your physician recommends that you continue on your current medications as directed. Please refer to the Current Medication list given to you today.  *If you need a refill on your cardiac medications before your next appointment, please call your pharmacy*   Lab Work: None Ordered If you have labs (blood work) drawn today and your tests are completely normal, you will receive your results only by: [place] (if you have MyChart) OR A paper copy in the mail If you have any lab test that is abnormal or we need to change your treatment, we will call you to review the results.   Testing/Procedures: None Ordered   Follow-Up: At [facility], you and your health needs are our priority.  As part of our continuing mission to provide you with exceptional heart care, we have created designated Provider Care Teams.  These Care Teams include your primary Cardiologist (physician) and Advanced Practice Providers (APPs -  Physician Assistants and Nurse Practitioners) who all work together to provide you with the care you need, when you need it.   Your next appointment:   1 year(s)  The format for your next appointment:   In Person  Provider:   You may see [name], MD or one of the following Advanced Practice Providers on your designated Care Team:   Richardson Dopp, PA-C [place], [place]

## 2021-04-12 ENCOUNTER — Telehealth: Payer: Self-pay

## 2021-04-12 ENCOUNTER — Other Ambulatory Visit: Payer: Self-pay

## 2021-04-12 DIAGNOSIS — E782 Mixed hyperlipidemia: Secondary | ICD-10-CM

## 2021-04-12 MED ORDER — PRALUENT 75 MG/ML ~~LOC~~ SOAJ: 1.0000 mL | SUBCUTANEOUS | 11 refills | Status: DC

## 2021-04-12 NOTE — Telephone Encounter (Signed)
Called and informed pt that he may have a deductible that he must meet. I instructed the pt to call back if they have more issues.

## 2021-04-13 NOTE — Telephone Encounter (Signed)
Called and lmom pt that they were approved for praluent '75mg'$  biweekly autoinjector. Copay card approved and sent to pharmacy, also ordered lipid and lft to be scheduled post 4th dose. Will await callback for pt.

## 2021-04-13 NOTE — Addendum Note (Signed)
Addended by: Allean Found on: 04/13/2021 05:10 PM   Modules accepted: Orders

## 2021-04-13 NOTE — Telephone Encounter (Signed)
Please also make sure that there is an active prior authorization approval for Praluent on file. Just received a fax from his [employer] that a prior authorization is needed. Key [id number] in Cover My Meds.

## 2021-04-13 NOTE — Telephone Encounter (Signed)
Copay card approved and called into the pharmacy. I also wanted to make sure that they were able to process the claim for the praluent since we keep receiving the cmm populated messages that they need a prior authorization and they stated no but the cost was expensive and copay card brought it down to being affordable

## 2021-04-13 NOTE — Telephone Encounter (Addendum)
Pt called clinic returning Haleigh's call. States pharmacy advised him Praluent copay is > $600 for 3 month supply. He was prescribed this after May visit with Gerald Stabs. States he never started on it due to cost.  Unsure about deductible message. Pt has Pharmacist, community and qualifies for copay card. I do not see that this has been provided to him. Copay card should bring cost down to $25/1 month or $75/3 month supply. Pt states this is affordable. He would like a call back once this is done.

## 2021-04-14 ENCOUNTER — Ambulatory Visit (INDEPENDENT_AMBULATORY_CARE_PROVIDER_SITE_OTHER): Payer: Medicare Other | Admitting: Internal Medicine

## 2021-04-14 ENCOUNTER — Encounter: Payer: Self-pay | Admitting: Internal Medicine

## 2021-04-14 ENCOUNTER — Other Ambulatory Visit: Payer: Self-pay

## 2021-04-14 ENCOUNTER — Other Ambulatory Visit: Payer: Self-pay | Admitting: Cardiovascular Disease

## 2021-04-14 DIAGNOSIS — I25810 Atherosclerosis of coronary artery bypass graft(s) without angina pectoris: Secondary | ICD-10-CM

## 2021-04-14 DIAGNOSIS — R053 Chronic cough: Secondary | ICD-10-CM

## 2021-04-14 DIAGNOSIS — E782 Mixed hyperlipidemia: Secondary | ICD-10-CM

## 2021-04-14 NOTE — Assessment & Plan Note (Signed)
Onset 2004 worse since 2016 with neg resp to allergy eval/Rx Orvil Feil nor better on inhalers around 2016 - HRCT chest 10/14/20  Neg x ? Small airways dz  - pred x 6 days 11/16/20 > much better x 6 weeks then cough p ex  - symbicort 80 2bid 01/13/2021 >>> improved on 1 bid/ hoarse on 2 bid reported 04/14/2021 so rec one bid and use arm and hammer p use or add spacer next   - The proper method of use, as well as anticipated side effects, of a metered-dose inhaler were discussed and demonstrated to the patient using teach back method   Almost certainly this is cough variant asthma with component of Upper airway cough syndrome (previously labeled PNDS),  is so named because it's frequently impossible to sort out how much is  CR/sinusitis with freq throat clearing (which can be related to primary GERD)   vs  causing  secondary (" extra esophageal")  GERD from wide swings in gastric pressure that occur with throat clearing, often  promoting self use of mint and menthol lozenges that reduce the lower esophageal sphincter tone and exacerbate the problem further in a cyclical fashion.   These are the same pts (now being labeled as having "irritable larynx syndrome" by some cough centers) who not infrequently have a history of having failed to tolerate ace inhibitors,  dry powder inhalers or biphosphonates or report having atypical/extraesophageal reflux symptoms that don't respond to standard doses of PPI  and are easily confused as having aecopd or asthma flares by even experienced allergists/ pulmonologists (myself included).   >>> recs as above, use the lowest dose of symbicort that keeps the cough under control s adding to the irritation of the upper airway.   >>>  F/u 6 m, sooner prn          Each maintenance medication was reviewed in detail including emphasizing most importantly the difference between maintenance and prns and under what circumstances the prns are to be triggered using an action plan format  where appropriate.  Total time for H and P, chart review, counseling, reviewing hfa device(s) and generating customized AVS unique to this office visit / same day charting = 25 min

## 2021-04-14 NOTE — Patient Instructions (Signed)
Work on inhaler technique:  relax and gently blow all the way out then take a nice smooth full deep breath back in, triggering the inhaler at same time you start breathing in.  Hold for up to 5 seconds if you can. Blow out thru nose. Rinse and gargle with water when done.  If mouth or throat bother you at all,  try brushing teeth/gums/tongue with arm and hammer toothpaste/ make a slurry and gargle and spit out.   Ok to decrease symbicort 80 to one twice daily   Please schedule a follow up visit in 6 months but call sooner if needed

## 2021-04-14 NOTE — Progress Notes (Signed)
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 and lasting sev months in winter around 2004 but more of a daily cough worse on exp to cold care and waxes and wanes and eval by allergist Dr Orvil Feil dust some plants took shots x 2 year finished around 2015  and no better on inhalers either so referred to pulmonary clinic 11/16/2020 by Dr   Alain Marion  Covid 06/2020 multiple fm members and he had similar symptoms  But never tested   History of Present Illness  11/16/2020  Pulmonary/ 1st office eval/Karlene Southard  Chief Complaint  Patient presents with   Consult    C/o  dry cough worse in winter, had 10 years or more,rarely wheezing  Dyspnea:  Cough p finished prednisone  Esp in cold weather / never tried singulair Cough: dry worse with chocalate, cold air, smell of frying onions/ hot humid Sleep: no cough sleeping flat or on waking in am SABA use: none  rec Pantoprazole (protonix) 40 mg   Take  30-60 min before first meal of the day and Pepcid (famotidine)  20 mg one after supper  until return to office - this is the best way to tell whether stomach acid is contributing to your problem.   Prednisone 10 mg take  4 each am x 2 days,   2 each am x 2 days,  1 each am x 2 days and stop  GERD diet Please schedule a follow up office visit in 6 weeks, call sooner if needed    01/13/2021  f/u ov/Milbern Doescher re: cough x 10 years  Both times eliminated by prednisone for extended periods / stopped gerd rx p one month s cough until jogging x one week  Chief Complaint  Patient presents with   Follow-up    Doing well, cough is better  Dyspnea:  Does fine jogging / some cough p running x 30 min Cough: dry, esp with cold air exp  Sleeping: none  SABA use: none  02: none  Covid status:   vax x 3 pfizer  Rec Symbicort 80 Take 2 puffs first thing in am and  then another 2 puffs about 12 hours later.  Work on inhaler technique: If not improving > Prednisone 10 mg take  4 each am x 2 days,   2 each am x 2 days,  1 each am x 2 days and stop  F/ u with inhaler in hand   04/14/2021  f/u ov/Saba Neuman re: cough variant asthma maint on symbicort 80 one twice daily and no hoarseness  / still using  Chief Complaint  Patient presents with   Follow-up    Patient is here for a follow up on cough and reports its better.    Dyspnea:  Not limited by breathing from desired activities   Cough: better but hoarse since higher dosage  Sleeping: fine on thicker pillow flat bed  SABA use: none  02: none Covid status:   vax x 4   No obvious day to day or daytime variability or assoc excess/ purulent sputum or mucus plugs or hemoptysis or cp or chest tightness, subjective wheeze or overt sinus or hb symptoms.   Sleeping  without nocturnal  or early am exacerbation  of respiratory  c/o's or need for noct saba. Also denies  any obvious fluctuation of symptoms with weather or environmental changes or other aggravating or alleviating factors except as outlined above   No unusual exposure hx or h/o childhood pna/ asthma or knowledge of premature birth.  Current Allergies, Complete Past Medical History, Past Surgical History, Family History, and Social History were reviewed in [employer] record.  ROS  The following are not active complaints unless bolded Hoarseness, sore throat, dysphagia, dental problems, itching, sneezing,  nasal congestion or discharge of excess mucus or purulent secretions, ear ache,   fever, chills, sweats, unintended wt loss or wt gain, classically pleuritic or exertional cp,  orthopnea pnd or arm/hand swelling  or leg swelling, presyncope, palpitations, abdominal pain, anorexia, nausea, vomiting, diarrhea  or change in bowel habits or change in bladder habits, change in stools or change in urine, dysuria, hematuria,  rash,  arthralgias, visual complaints, headache, numbness, weakness or ataxia or problems with walking or coordination,  change in mood or  memory.        Current Meds  Medication Sig   Alirocumab (PRALUENT) 75 MG/ML SOAJ Inject 1 mL into the skin every 14 (fourteen) days.   aspirin EC 81 MG tablet Take 1 tablet (81 mg total) by mouth daily.   budesonide-formoterol (SYMBICORT) 80-4.5 MCG/ACT inhaler Take 2 puffs first thing in am and then another 2 puffs about 12 hours later.   Cholecalciferol 2000 UNITS CAPS Take 2,000 Units by mouth every evening.   Ferrous Sulfate (IRON) 325 (65 Fe) MG TABS Take 1 tablet by mouth. Taking three times a week   levothyroxine (SYNTHROID) 25 MCG tablet Take 25 mcg by mouth daily before breakfast. Per patient taking 2 tablets on Monday-Saturday, do not take on 'Sunday   Omega-3 Fatty Acids (FISH OIL) 1000 MG CAPS Take 1 capsule by mouth daily.   Turmeric 500 MG CAPS Take 1 capsule by mouth at bedtime.           Past Medical History:  Diagnosis Date   Allergy    rhinitis   Anemia    "Hgb always on the low side" (07/02/2013)   CAD (coronary artery disease)    a. LHC 2/17: pLAD 50, mLAD 95, pRCA 30, dRCA 25, EF 50-55% >> PCI:  3 x 18 mm Resolute DES to mLAD    Chronic bronchitis (HCC)    "not in the last year since I started taking allergy shots" (10/09/2016)   Chronic lower back pain    Chronic sinus bradycardia 07/02/2013   Coronary artery disease 10/15/2016   Cryptogenic stroke (HCC) 07/02/2013   a. "small ischemic occipital right sided" (07/02/2013)  //  b. Event monitor 10/14: sinus brady  //  c. Carotid US 10/14: bilat ICA 1-39%   Esophageal stricture    Exertional angina (HCC) 10/09/2016   Heart murmur    "as a child" (07/02/2013)   History of echocardiogram    a. Echo 10/14: EF 60-65%, no RWMA, normal diastolic function   Hyperlipidemia    Hypothyroidism    Nonallopathic lesion of lumbosacral region 06/04/2017   Nonallopathic lesion of sacral region  06/04/2017   Nonallopathic lesion of thoracic region 06/04/2017   Osteoarthritis    "lower back" (10/09/2016)   PFO (patent foramen ovale)    a. TEE 11/14: mild LVH, EF 60-65%, small PFO, R-L shunt       Objective:       04/14/2021       18'$ 4   01/13/21 192 lb 12.8 oz (87.5  kg)  01/12/21 192 lb (87.1 kg)  11/16/20 190 lb 9.6 oz (86.5 kg)      Vital signs reviewed  04/14/2021  - Note at rest 02 sats  99% on RA   General appearance:    slt hoarse amb wm still clearing throat some   HEENT : pt wearing mask not removed for exam due to covid -19 concerns.    NECK :  without JVD/Nodes/TM/ nl carotid upstrokes bilaterally   LUNGS: no acc muscle use,  Nl contour chest which is clear to A and P bilaterally without cough on insp or exp maneuvers   CV:  RRR  no s3 or murmur or increase in P2, and no edema   ABD:  soft and nontender with nl inspiratory excursion in the supine position. No bruits or organomegaly appreciated, bowel sounds nl  MS:  Nl gait/ ext warm without deformities, calf tenderness, cyanosis or clubbing No obvious joint restrictions   SKIN: warm and dry without lesions    NEURO:  alert, approp, nl sensorium with  no motor or cerebellar deficits apparent.          Assessment

## 2021-04-17 NOTE — Telephone Encounter (Signed)
Patient called back. He was made aware of the approval and scheduled for follow up labs on 10/24

## 2021-04-17 NOTE — Telephone Encounter (Signed)
Called and spoke w/pharmacist  Jordie Wilford Key: [id number] help? Call us at [phone] Outcome Additional Information Required Your PA has been resolved, no additional PA is required. For further inquiries please contact the number on the back of the member prescription card. (Message [phone])

## 2021-04-24 NOTE — Telephone Encounter (Signed)
Patient called stating [facility] told him Praluent would be $228/month. I called [facility]- they were able to find his hard copy copay card. Cost now $25

## 2021-05-09 DIAGNOSIS — R5383 Other fatigue: Secondary | ICD-10-CM | POA: Diagnosis not present

## 2021-05-09 DIAGNOSIS — Z6824 Body mass index (BMI) 24.0-24.9, adult: Secondary | ICD-10-CM | POA: Diagnosis not present

## 2021-05-09 DIAGNOSIS — I25810 Atherosclerosis of coronary artery bypass graft(s) without angina pectoris: Secondary | ICD-10-CM | POA: Diagnosis not present

## 2021-05-09 DIAGNOSIS — E039 Hypothyroidism, unspecified: Secondary | ICD-10-CM | POA: Diagnosis not present

## 2021-05-18 ENCOUNTER — Other Ambulatory Visit: Payer: Self-pay | Admitting: Internal Medicine

## 2021-07-10 ENCOUNTER — Other Ambulatory Visit: Payer: Self-pay

## 2021-07-10 ENCOUNTER — Other Ambulatory Visit: Payer: Medicare Other | Admitting: *Deleted

## 2021-07-10 DIAGNOSIS — E782 Mixed hyperlipidemia: Secondary | ICD-10-CM | POA: Diagnosis not present

## 2021-07-10 LAB — HEPATIC FUNCTION PANEL
ALT: 19 IU/L (ref 0–44)
AST: 20 IU/L (ref 0–40)
Albumin: 4.6 g/dL (ref 3.7–4.7)
Alkaline Phosphatase: 49 IU/L (ref 44–121)
Bilirubin Total: 0.5 mg/dL (ref 0.0–1.2)
Bilirubin, Direct: 0.15 mg/dL (ref 0.00–0.40)
Total Protein: 6.6 g/dL (ref 6.0–8.5)

## 2021-07-10 LAB — LIPID PANEL
Chol/HDL Ratio: 4.1 ratio (ref 0.0–5.0)
Cholesterol, Total: 209 mg/dL — ABNORMAL HIGH (ref 100–199)
HDL: 51 mg/dL (ref >39)
LDL Chol Calc (NIH): 121 mg/dL — ABNORMAL HIGH (ref 0–99)
Triglycerides: 211 mg/dL — ABNORMAL HIGH (ref 0–149)
VLDL Cholesterol Cal: 37 mg/dL (ref 5–40)

## 2021-07-12 ENCOUNTER — Telehealth: Payer: Self-pay | Admitting: Pharmacist

## 2021-07-12 NOTE — Telephone Encounter (Signed)
Spoke with patient. LDL decreased well on Praluent however patient is reporting muscle pain and weakness.  Is going to continue treatment but reports if it gets worse he may have to discontinue and will call us.

## 2021-08-21 ENCOUNTER — Emergency Department (HOSPITAL_COMMUNITY): Payer: Medicare Other

## 2021-08-21 ENCOUNTER — Encounter (HOSPITAL_COMMUNITY): Payer: Self-pay | Admitting: Emergency Medicine

## 2021-08-21 ENCOUNTER — Other Ambulatory Visit: Payer: Self-pay

## 2021-08-21 ENCOUNTER — Emergency Department (HOSPITAL_COMMUNITY)
Admission: EM | Admit: 2021-08-21 | Discharge: 2021-08-22 | Disposition: A | Discharge status: Home / Self Care | Payer: Medicare Other | Attending: Emergency Medicine | Admitting: Emergency Medicine

## 2021-08-21 DIAGNOSIS — R200 Anesthesia of skin: Secondary | ICD-10-CM | POA: Diagnosis not present

## 2021-08-21 DIAGNOSIS — I2510 Atherosclerotic heart disease of native coronary artery without angina pectoris: Secondary | ICD-10-CM | POA: Diagnosis not present

## 2021-08-21 DIAGNOSIS — E039 Hypothyroidism, unspecified: Secondary | ICD-10-CM | POA: Diagnosis not present

## 2021-08-21 DIAGNOSIS — Z20822 Contact with and (suspected) exposure to covid-19: Secondary | ICD-10-CM | POA: Diagnosis not present

## 2021-08-21 DIAGNOSIS — Z951 Presence of aortocoronary bypass graft: Secondary | ICD-10-CM | POA: Insufficient documentation

## 2021-08-21 DIAGNOSIS — R079 Chest pain, unspecified: Secondary | ICD-10-CM | POA: Insufficient documentation

## 2021-08-21 DIAGNOSIS — I10 Essential (primary) hypertension: Secondary | ICD-10-CM | POA: Diagnosis not present

## 2021-08-21 DIAGNOSIS — R29818 Other symptoms and signs involving the nervous system: Secondary | ICD-10-CM | POA: Diagnosis not present

## 2021-08-21 DIAGNOSIS — G459 Transient cerebral ischemic attack, unspecified: Secondary | ICD-10-CM | POA: Diagnosis not present

## 2021-08-21 DIAGNOSIS — R0789 Other chest pain: Secondary | ICD-10-CM | POA: Diagnosis not present

## 2021-08-21 DIAGNOSIS — R4781 Slurred speech: Secondary | ICD-10-CM | POA: Diagnosis not present

## 2021-08-21 DIAGNOSIS — R519 Headache, unspecified: Secondary | ICD-10-CM | POA: Diagnosis present

## 2021-08-21 DIAGNOSIS — Z79899 Other long term (current) drug therapy: Secondary | ICD-10-CM | POA: Diagnosis not present

## 2021-08-21 DIAGNOSIS — Z7982 Long term (current) use of aspirin: Secondary | ICD-10-CM | POA: Diagnosis not present

## 2021-08-21 LAB — URINALYSIS, ROUTINE W REFLEX MICROSCOPIC
Bacteria, UA: NONE SEEN
Bilirubin Urine: NEGATIVE
Glucose, UA: NEGATIVE mg/dL
Hgb urine dipstick: NEGATIVE
Ketones, ur: NEGATIVE mg/dL
Leukocytes,Ua: NEGATIVE
Nitrite: NEGATIVE
Protein, ur: NEGATIVE mg/dL
Specific Gravity, Urine: 1.015 (ref 1.005–1.030)
pH: 6 (ref 5.0–8.0)

## 2021-08-21 LAB — DIFFERENTIAL
Abs Immature Granulocytes: 0.02 10*3/uL (ref 0.00–0.07)
Basophils Absolute: 0 10*3/uL (ref 0.0–0.1)
Basophils Relative: 0 %
Eosinophils Absolute: 0.2 10*3/uL (ref 0.0–0.5)
Eosinophils Relative: 2 %
Immature Granulocytes: 0 %
Lymphocytes Relative: 22 %
Lymphs Abs: 2 10*3/uL (ref 0.7–4.0)
Monocytes Absolute: 0.6 10*3/uL (ref 0.1–1.0)
Monocytes Relative: 6 %
Neutro Abs: 6.5 10*3/uL (ref 1.7–7.7)
Neutrophils Relative %: 70 %

## 2021-08-21 LAB — I-STAT CHEM 8, ED
BUN: 19 mg/dL (ref 8–23)
Calcium, Ion: 1.21 mmol/L (ref 1.15–1.40)
Chloride: 103 mmol/L (ref 98–111)
Creatinine, Ser: 1.1 mg/dL (ref 0.61–1.24)
Glucose, Bld: 125 mg/dL — ABNORMAL HIGH (ref 70–99)
HCT: 41 % (ref 39.0–52.0)
Hemoglobin: 13.9 g/dL (ref 13.0–17.0)
Potassium: 3.9 mmol/L (ref 3.5–5.1)
Sodium: 138 mmol/L (ref 135–145)
TCO2: 27 mmol/L (ref 22–32)

## 2021-08-21 LAB — CBC
HCT: 40.2 % (ref 39.0–52.0)
Hemoglobin: 14.1 g/dL (ref 13.0–17.0)
MCH: 32.3 pg (ref 26.0–34.0)
MCHC: 35.1 g/dL (ref 30.0–36.0)
MCV: 92 fL (ref 80.0–100.0)
Platelets: 215 10*3/uL (ref 150–400)
RBC: 4.37 MIL/uL (ref 4.22–5.81)
RDW: 12 % (ref 11.5–15.5)
WBC: 9.3 10*3/uL (ref 4.0–10.5)
nRBC: 0 % (ref 0.0–0.2)

## 2021-08-21 LAB — CBG MONITORING, ED: Glucose-Capillary: 137 mg/dL — ABNORMAL HIGH (ref 70–99)

## 2021-08-21 LAB — COMPREHENSIVE METABOLIC PANEL
ALT: 21 U/L (ref 0–44)
AST: 27 U/L (ref 15–41)
Albumin: 4.2 g/dL (ref 3.5–5.0)
Alkaline Phosphatase: 44 U/L (ref 38–126)
Anion gap: 7 (ref 5–15)
BUN: 17 mg/dL (ref 8–23)
CO2: 26 mmol/L (ref 22–32)
Calcium: 9.6 mg/dL (ref 8.9–10.3)
Chloride: 104 mmol/L (ref 98–111)
Creatinine, Ser: 1.08 mg/dL (ref 0.61–1.24)
GFR, Estimated: >60 mL/min (ref >60)
Glucose, Bld: 129 mg/dL — ABNORMAL HIGH (ref 70–99)
Potassium: 3.9 mmol/L (ref 3.5–5.1)
Sodium: 137 mmol/L (ref 135–145)
Total Bilirubin: 0.8 mg/dL (ref 0.3–1.2)
Total Protein: 6.7 g/dL (ref 6.5–8.1)

## 2021-08-21 LAB — PROTIME-INR
INR: 1 (ref 0.8–1.2)
Prothrombin Time: 13 seconds (ref 11.4–15.2)

## 2021-08-21 LAB — RESP PANEL BY RT-PCR (FLU A&B, COVID) ARPGX2
Influenza A by PCR: NEGATIVE
Influenza B by PCR: NEGATIVE
SARS Coronavirus 2 by RT PCR: NEGATIVE

## 2021-08-21 LAB — TROPONIN I (HIGH SENSITIVITY): Troponin I (High Sensitivity): 23 ng/L — ABNORMAL HIGH (ref <18)

## 2021-08-21 LAB — APTT: aPTT: 28 seconds (ref 24–36)

## 2021-08-21 NOTE — ED Triage Notes (Addendum)
Pt presents to ED POV. Pt c/o R arm numbness and dysarthria "a little after 6:00 pm." Pt reports numbness is gone now but "speech problems" have worsened since. Hx of strokes

## 2021-08-21 NOTE — ED Provider Notes (Signed)
[facility] EMERGENCY DEPARTMENT Provider Note   CSN: [id number] Arrival date & time: 08/21/21  2004     History Chief Complaint  Patient presents with   Numbness    Richard Ellis is a 75 y.o. male.  The history is provided by the patient and medical records.  Neurologic Problem This is a new (R forearm and hand numbness, word finding difficulties) problem. Episode onset: 1815 tonight. Episode frequency: lasted for 30 minutes. The problem has been resolved. Associated symptoms include headaches. Pertinent negatives include no chest pain, no abdominal pain and no shortness of breath. Nothing aggravates the symptoms. Nothing relieves the symptoms. He has tried nothing for the symptoms.      Past Medical History:  Diagnosis Date   Allergy    rhinitis   Anemia    "Hgb always on the low side" (07/02/2013)   CAD (coronary artery disease)    a. LHC 2/17: pLAD 50, mLAD 95, pRCA 30, dRCA 25, EF 50-55% >> PCI:  3 x 18 mm Resolute DES to mLAD    Chronic bronchitis ([place])    "not in the last year since I started taking allergy shots" (10/09/2016)   Chronic lower back pain    Chronic sinus bradycardia 07/02/2013   Coronary artery disease 10/15/2016   Cryptogenic stroke ([place]) 07/02/2013   a. "small ischemic occipital right sided" (07/02/2013)  //  b. Event monitor 10/14: sinus brady  //  c. Carotid US 10/14: bilat ICA 1-39%   Esophageal stricture    Exertional angina ([place]) 10/09/2016   Heart murmur    "as a child" (07/02/2013)   History of echocardiogram    a. Echo 10/14: EF 60-65%, no RWMA, normal diastolic function   Hyperlipidemia    Hypothyroidism    Nonallopathic lesion of lumbosacral region 06/04/2017   Nonallopathic lesion of sacral region 06/04/2017   Nonallopathic lesion of thoracic region 06/04/2017   Osteoarthritis    "lower back" (10/09/2016)   PFO (patent foramen ovale)    a. TEE 11/14: mild LVH, EF 60-65%, small PFO, R-L shunt    Patient Active Problem  List   Diagnosis Date Noted   Statin myopathy 01/13/2021   Rotator cuff tear 06/29/2020   Degenerative lumbar spinal stenosis 12/03/2019   Essential (primary) hypertension 10/27/2019   Lumbar degenerative disc disease 10/27/2019   Nonallopathic lesion of lumbosacral region 06/04/2017   Nonallopathic lesion of sacral region 06/04/2017   Nonallopathic lesion of thoracic region 06/04/2017   Blood loss anemia 10/29/2016   Coronary artery disease 10/15/2016   Exertional angina ([place]) 10/09/2016   Arteriosclerosis of coronary artery bypass graft 11/11/2015   Patent foramen ovale 11/07/2015   Labral tear of hip, degenerative 11/07/2015   Tear of left hamstring 11/07/2015   Spondylosis of lumbar region without myelopathy or radiculopathy 09/06/2015   Otitis, externa, infective 12/23/2014   Dyslipidemia 06/08/2014   Gluteus medius or minimus syndrome 02/25/2014   History of CVA (cerebrovascular accident) 07/02/2013   Chronic sinus bradycardia 07/02/2013   Actinic keratoses 01/29/2013   Elevated PSA 08/13/2012   Well adult exam 08/11/2012   HIP PAIN 11/14/2010   GROIN PAIN 11/14/2010   Neoplasm of uncertain behavior of skin 10/27/2010   Asthma 10/27/2010   OSTEOARTHRITIS 10/27/2010   Myalgia 10/27/2010   Fatigue 12/23/2008   Chronic cough 10/15/2008   CBC, ABNORMAL 10/07/2008   Other abnormal glucose 11/04/2007   Hypothyroidism 07/10/2007   Hyperlipidemia 07/10/2007    Past Surgical History:  Procedure  Laterality Date   CARDIAC CATHETERIZATION N/A 11/10/2015   Procedure: Left Heart Cath and Coronary Angiography;  Surgeon: [name], MD;  Location: [place] [facility];  Service: Cardiovascular;  Laterality: N/A;   CARDIAC CATHETERIZATION N/A 11/10/2015   Procedure: Coronary Stent Intervention;  Surgeon: [name], MD;  Location: [place] [facility];  Service: Cardiovascular;  Laterality: N/A;  mid lad  3.0x18 resolute   CARDIAC CATHETERIZATION  10/09/2016   "scheduled OHS for  tomorrow" (10/09/2016)   CARDIAC CATHETERIZATION N/A 10/09/2016   Procedure: Left Heart Cath and Coronary Angiography;  Surgeon: [name], MD;  Location: [place] [facility];  Service: Cardiovascular;  Laterality: N/A;   CARDIAC CATHETERIZATION N/A 10/09/2016   Procedure: Coronary Balloon Angioplasty;  Surgeon: [name], MD;  Location: [place] [facility];  Service: Cardiovascular;  Laterality: N/A;   CORONARY ARTERY BYPASS GRAFT N/A 10/15/2016   Procedure: CORONARY ARTERY BYPASS GRAFTING (CABG), ON PUMP, TIMES TWO, USING LEFT INTERNAL MAMMARY ARTERY AND RIGHT GREATER SAPHENOUS VEIN HARVESTED ENDOSCOPICALLY;  Surgeon: Ivin Poot, MD;  Location: [place];  Service: Open Heart Surgery;  Laterality: N/A;  LIMA to LAD, SVG to DIAGONAL   ESOPHAGOGASTRODUODENOSCOPY N/A 10/26/2012   Procedure: ESOPHAGOGASTRODUODENOSCOPY (EGD);  Surgeon: Winfield Cunas., MD;  Location: [facility] ENDOSCOPY;  Service: Endoscopy;  Laterality: N/A;   ESOPHAGOGASTRODUODENOSCOPY N/A 07/08/2013   Procedure: ESOPHAGOGASTRODUODENOSCOPY (EGD);  Surgeon: Winfield Cunas., MD;  Location: [facility];  Service: Endoscopy;  Laterality: N/A;   INNER EAR SURGERY Left 1957   "related to ear infection"   REPAIR OF PATENT FORAMEN OVALE N/A 10/15/2016   Procedure: REPAIR OF PATENT FORAMEN OVALE;  Surgeon: Ivin Poot, MD;  Location: [place];  Service: Open Heart Surgery;  Laterality: N/A;   SAVORY DILATION N/A 07/08/2013   Procedure: SAVORY DILATION;  Surgeon: Winfield Cunas., MD;  Location: [facility] ENDOSCOPY;  Service: Endoscopy;  Laterality: N/A;   TEE WITHOUT CARDIOVERSION N/A 07/28/2013   Procedure: TRANSESOPHAGEAL ECHOCARDIOGRAM (TEE);  Surgeon: Pixie Casino, MD;  Location: [facility] ENDOSCOPY;  Service: Cardiovascular;  Laterality: N/A;   TEE WITHOUT CARDIOVERSION N/A 10/15/2016   Procedure: TRANSESOPHAGEAL ECHOCARDIOGRAM (TEE);  Surgeon: Ivin Poot, MD;  Location: [place];  Service: Open Heart Surgery;  Laterality: N/A;    TONSILLECTOMY  1950's       Family History  Problem Relation Age of Onset   Coronary artery disease Other        male first degree relative <60   Heart disease Father        heart attack   COPD Mother    Heart disease Paternal Uncle    Colon cancer Neg Hx     Social History   Tobacco Use   Smoking status: Never   Smokeless tobacco: Never  Vaping Use   Vaping Use: Never used  Substance Use Topics   Alcohol use: Yes    Alcohol/week: 8.0 standard drinks    Types: 4 Glasses of wine, 4 Cans of beer per week    Comment: weekly   Drug use: No    Home Medications Prior to Admission medications   Medication Sig Start Date End Date Taking? Authorizing Provider  Alirocumab (PRALUENT) 75 MG/ML SOAJ INJECT 1 ML INTO THE SKIN EVERY 14 (FOURTEEN) DAYS. 04/17/21   [name], MD  aspirin EC 81 MG tablet Take 1 tablet (81 mg total) by mouth daily. 03/03/18   [name], MD  budesonide-formoterol [facility]) 80-4.5 MCG/ACT inhaler Take  2 puffs first thing in am and then another 2 puffs about 12 hours later. 01/13/21   Tanda Rockers, MD  Cholecalciferol 2000 UNITS CAPS Take 2,000 Units by mouth every evening.    [provider]  Ferrous Sulfate (IRON) 325 (65 Fe) MG TABS Take 1 tablet by mouth. Taking three times a week    [provider]  levothyroxine (SYNTHROID) 25 MCG tablet Take 25 mcg by mouth daily before breakfast. Per patient taking 2 tablets on Monday-Saturday, do not take on Sunday    [provider]  Omega-3 Fatty Acids (FISH OIL) 1000 MG CAPS Take 1 capsule by mouth daily.    [provider]  pantoprazole (PROTONIX) 40 MG tablet TAKE 1 TABLET BY MOUTH EVERY DAY *ANNUAL APPT W/LABS DUE IN MARCH MUST SEE PROVIDER FOR REFILLS* 05/19/21   Plotnikov, Evie Lacks, MD  Turmeric 500 MG CAPS Take 1 capsule by mouth at bedtime.    [provider]    Allergies    Niacin er, Statins, Atorvastatin, Crestor [rosuvastatin], Demerol  [meperidine], Niacin and related, and Repatha [evolocumab]  Review of Systems   Review of Systems  Constitutional:  Negative for chills and fever.  HENT:  Negative for ear pain and sore throat.   Eyes:  Negative for pain and visual disturbance.  Respiratory:  Negative for cough and shortness of breath.   Cardiovascular:  Negative for chest pain and palpitations.  Gastrointestinal:  Negative for abdominal pain and vomiting.  Genitourinary:  Negative for dysuria and hematuria.  Musculoskeletal:  Negative for arthralgias and back pain.  Skin:  Negative for color change and rash.  Neurological:  Positive for headaches. Negative for seizures and syncope.  All other systems reviewed and are negative.  Physical Exam Updated Vital Signs BP 135/72 (BP Location: Right Arm)   Pulse (!) 47   Temp 97.9 F (36.6 C) (Oral)   Resp 18   Ht 6\' 1"  (1.854 m)   Wt 83.9 kg   SpO2 97%   BMI 24.41 kg/m   Physical Exam Vitals and nursing note reviewed.  Constitutional:      General: He is not in acute distress.    Appearance: Normal appearance. He is well-developed.  HENT:     Head: Normocephalic and atraumatic.     Right Ear: External ear normal.     Left Ear: External ear normal.     Nose: Nose normal. No congestion.     Mouth/Throat:     Mouth: Mucous membranes are moist.     Pharynx: Oropharynx is clear. No posterior oropharyngeal erythema.  Eyes:     Extraocular Movements: Extraocular movements intact.     Conjunctiva/sclera: Conjunctivae normal.     Pupils: Pupils are equal, round, and reactive to light.  Cardiovascular:     Rate and Rhythm: Normal rate and regular rhythm.     Pulses: Normal pulses.     Heart sounds: No murmur heard. Pulmonary:     Effort: Pulmonary effort is normal. No respiratory distress.     Breath sounds: Normal breath sounds. No wheezing, rhonchi or rales.  Abdominal:     General: Abdomen is flat. Bowel sounds are normal.     Palpations: Abdomen is soft.      Tenderness: There is no abdominal tenderness. There is no guarding or rebound.  Musculoskeletal:        General: No swelling, tenderness or deformity. Normal range of motion.     Cervical back: Normal range of  motion and neck supple. No rigidity.  Skin:    General: Skin is warm and dry.     Capillary Refill: Capillary refill takes less than 2 seconds.     Findings: No rash.  Neurological:     General: No focal deficit present.     Mental Status: He is alert and oriented to person, place, and time.     Cranial Nerves: No cranial nerve deficit.     Sensory: No sensory deficit.     Motor: No weakness.     Coordination: Coordination normal.  Psychiatric:        Mood and Affect: Mood normal.    ED Results / Procedures / Treatments   Labs (all labs ordered are listed, but only abnormal results are displayed) Labs Reviewed  COMPREHENSIVE METABOLIC PANEL - Abnormal; Notable for the following components:      Result Value   Glucose, Bld 129 (*)    All other components within normal limits  CBG MONITORING, ED - Abnormal; Notable for the following components:   Glucose-Capillary 137 (*)    All other components within normal limits  I-STAT CHEM 8, ED - Abnormal; Notable for the following components:   Glucose, Bld 125 (*)    All other components within normal limits  TROPONIN I (HIGH SENSITIVITY) - Abnormal; Notable for the following components:   Troponin I (High Sensitivity) 23 (*)    All other components within normal limits  RESP PANEL BY RT-PCR (FLU A&B, COVID) ARPGX2  PROTIME-INR  APTT  CBC  DIFFERENTIAL  URINALYSIS, ROUTINE W REFLEX MICROSCOPIC  TROPONIN I (HIGH SENSITIVITY)    EKG EKG Interpretation  Date/Time:  Monday August 21 2021 20:19:24 EST Ventricular Rate:  58 PR Interval:  172 QRS Duration: 110 QT Interval:  438 QTC Calculation: 429 R Axis:   111 Text Interpretation: Sinus bradycardia Left posterior fascicular block Anterior infarct , age undetermined  Abnormal ECG Confirmed by Elnora Morrison [phone]) on 08/21/2021 9:30:22 PM  Radiology CT Head Wo Contrast  Result Date: 08/21/2021 CLINICAL DATA:  Right arm numbness and slurred speech. EXAM: CT HEAD WITHOUT CONTRAST TECHNIQUE: Contiguous axial images were obtained from the base of the skull through the vertex without intravenous contrast. COMPARISON:  July 02, 2013 FINDINGS: Brain: There is mild cerebral atrophy with widening of the extra-axial spaces and ventricular dilatation. There are areas of decreased attenuation within the white matter tracts of the supratentorial brain, consistent with microvascular disease changes. Vascular: No hyperdense vessel or unexpected calcification. Skull: Normal. Negative for fracture or focal lesion. Sinuses/Orbits: No acute finding. Other: None. IMPRESSION: 1. Generalized cerebral atrophy. 2. No acute intracranial abnormality. Electronically Signed   By: Virgina Norfolk M.D.   On: 08/21/2021 22:00   MR BRAIN WO CONTRAST  Result Date: 08/21/2021 CLINICAL DATA:  Acute neurologic deficit.  Right hand numbness. EXAM: MRI HEAD WITHOUT CONTRAST TECHNIQUE: Multiplanar, multiecho pulse sequences of the brain and surrounding structures were obtained without intravenous contrast. COMPARISON:  07/02/2013 FINDINGS: Brain: No acute infarct, mass effect or extra-axial collection. No acute or chronic hemorrhage. There is multifocal hyperintense T2-weighted signal within the white matter. Parenchymal volume and CSF spaces are normal. Old right corona radiata small vessel infarct the midline structures are normal. Vascular: Major flow voids are preserved. Skull and upper cervical spine: Normal calvarium and skull base. Visualized upper cervical spine and soft tissues are normal. Sinuses/Orbits:No paranasal sinus fluid levels or advanced mucosal thickening. No mastoid or middle ear effusion. Normal orbits. IMPRESSION: 1.  No acute intracranial abnormality. 2. Old right corona radiata  small vessel infarct. Electronically Signed   By: Ulyses Jarred M.D.   On: 08/21/2021 23:29    Procedures Procedures   Medications Ordered in ED Medications - No data to display  ED Course  I have reviewed the triage vital signs and the nursing notes.  Pertinent labs & imaging results that were available during my care of the patient were reviewed by me and considered in my medical decision making (see chart for details).   MDM Rules/Calculators/A&P  76 year old male with history of stroke in 2017 status post PFO repair presenting with neurologic complaints as above.  Concerning for TIAv s recurrent stroke.  No focal deficits on arrival. CBG normal.  CT head showed no acute intracranial abnormalities.  MRI brain showed no acute infarct.  Presentation most consistent with TIA.  Offered observation versus discharge home with close follow-up with neurology team.  Patient states he would prefer to be discharged home as his symptoms have resolved and he has had recent labs with his PCP.  He is already maximized on medical therapy with baby aspirin and cholesterol medications.  Patient also reported a self-limiting episode of chest pressure in the waiting room.  His EKG showed no ischemic changes.  His initial troponin is slightly elevated to 23.  He does have a cardiac history.  He has had no recurrence of his chest pain.  Awaiting delta troponin.  If unchanged, anticipate discharge home with close follow-up with cardiology for updated cardiac testing.  Patient comfortable with plan.  Care of this patient transition to the oncoming provider awaiting delta troponin.   Final Clinical Impression(s) / ED Diagnoses Final diagnoses:  TIA (transient ischemic attack)  Chest pain, unspecified type    Rx / DC Orders ED Discharge Orders     None        Idamae Lusher, MD 08/22/21 Lynnell Catalan    Elnora Morrison, MD 08/23/21 1330

## 2021-08-21 NOTE — ED Notes (Signed)
Pt transported to CT/MRI

## 2021-08-21 NOTE — Discharge Instructions (Addendum)
1) please follow-up with your neurologist to discuss your transient right arm numbness and difficulty speaking tonight.  Your MRI did not show any new signs of stroke. 2) please follow-up with your cardiologist to discuss updated cardiac testing with a stress test and an echo. 3) return to the ED with any worsening numbness, difficulty speaking, headaches, chest pain, difficulty breathing.

## 2021-08-21 NOTE — ED Provider Notes (Signed)
Emergency Medicine Provider Triage Evaluation Note  Richard Ellis , a 75 y.o. male  was evaluated in triage.  Pt complains of R sided hand numbness that progressed to his arm. This has resolved. Patient wife and patient both also stating patient is having word finding difficulty but on examination this has resolved. Patient has history of CVA back in 2017. Patient not currently taking blood thinners.  Review of Systems  Positive: Aphasia, headache Negative: CP, SOB  Physical Exam  BP 129/69   Pulse (!) 59   Temp 97.9 F (36.6 C) (Oral)   Resp 16   SpO2 100%  Gen:   Awake, no distress   Resp:  Normal effort  MSK:   Moves extremities without difficulty  Other:  No aphasia. Patient has 5/5 strength UE and LE bilaterally. Patient has intact sensation bilaterally to UE and LE. Patient CN 2-12 intact. Patient is VAN negative.  Medical Decision Making  Medically screening exam initiated at 8:24 PM.  Appropriate orders placed.  Richard Ellis was informed that the remainder of the evaluation will be completed by another provider, this initial triage assessment does not replace that evaluation, and the importance of remaining in the ED until their evaluation is complete.  At this time, patient will have stroke work up completed. No indication for calling code stroke at this time. Patient not TPA candidate at this time given symptoms improving. VAN negative.    Richard Ellis 08/21/21 2031    Richard Pence, MD 08/21/21 2105

## 2021-08-22 ENCOUNTER — Telehealth: Payer: Self-pay | Admitting: Internal Medicine

## 2021-08-22 ENCOUNTER — Telehealth: Payer: Self-pay | Admitting: Cardiovascular Disease

## 2021-08-22 LAB — TROPONIN I (HIGH SENSITIVITY): Troponin I (High Sensitivity): 18 ng/L — ABNORMAL HIGH (ref <18)

## 2021-08-22 NOTE — Telephone Encounter (Signed)
Patient needs a f/u ed ov by 12-8  Informed patient provider does not have the availability, offered patient appt w/ Dr. Jenny Reichmann for 12-7, Patient declined stating he wants to see his provider  Patient is requesting a call back

## 2021-08-22 NOTE — Telephone Encounter (Signed)
Called patient and added to Dr. York Cerise schedule on Thursday 08/24/21.  Verbalized understanding and read back appt date and time.

## 2021-08-22 NOTE — Telephone Encounter (Signed)
Ok to add him onto my schedule this Thursday thanks

## 2021-08-22 NOTE — Telephone Encounter (Signed)
Patient was in the emergency room yesterday, he had a TIA.  They advised him to make an appt with Dr. Burt Knack first available is April and first available with APP is February.  Patient wants to be seen before then.  The ER doctor advised him he should have an echo and a stress test done.

## 2021-08-23 ENCOUNTER — Ambulatory Visit: Payer: Medicare Other | Admitting: Internal Medicine

## 2021-08-23 NOTE — Telephone Encounter (Signed)
Pt is schedule to see his cardiology tomorrow 08/24/21. Pls advise on double booking next week for ER follow-up.Marland KitchenChryl Heck

## 2021-08-24 ENCOUNTER — Other Ambulatory Visit: Payer: Self-pay

## 2021-08-24 ENCOUNTER — Ambulatory Visit: Payer: Medicare Other | Admitting: Cardiovascular Disease

## 2021-08-24 ENCOUNTER — Encounter: Payer: Self-pay | Admitting: *Deleted

## 2021-08-24 ENCOUNTER — Encounter: Payer: Self-pay | Admitting: Cardiovascular Disease

## 2021-08-24 ENCOUNTER — Ambulatory Visit (INDEPENDENT_AMBULATORY_CARE_PROVIDER_SITE_OTHER): Payer: Medicare Other | Admitting: Cardiovascular Disease

## 2021-08-24 VITALS — BP 120/68 | HR 50 | Ht 73.0 in | Wt 192.4 lb

## 2021-08-24 DIAGNOSIS — E782 Mixed hyperlipidemia: Secondary | ICD-10-CM | POA: Diagnosis not present

## 2021-08-24 DIAGNOSIS — I25810 Atherosclerosis of coronary artery bypass graft(s) without angina pectoris: Secondary | ICD-10-CM

## 2021-08-24 DIAGNOSIS — I25119 Atherosclerotic heart disease of native coronary artery with unspecified angina pectoris: Secondary | ICD-10-CM

## 2021-08-24 DIAGNOSIS — G459 Transient cerebral ischemic attack, unspecified: Secondary | ICD-10-CM | POA: Diagnosis not present

## 2021-08-24 MED ORDER — NITROGLYCERIN 0.4 MG SL SUBL: 0.4000 mg | SUBLINGUAL_TABLET | SUBLINGUAL | 3 refills | Status: DC | PRN

## 2021-08-24 MED ORDER — CLOPIDOGREL BISULFATE 75 MG PO TABS: 75.0000 mg | ORAL_TABLET | Freq: Every day | ORAL | 3 refills | Status: DC

## 2021-08-24 NOTE — Telephone Encounter (Signed)
Agree with cardiology appointment on 08/24/2021.  I can see him next week.  Thanks

## 2021-08-24 NOTE — Patient Instructions (Signed)
Medication Instructions:  Your physician has recommended you make the following change in your medication:   START Plavix 75 mg taking 1 daily   *If you need a refill on your cardiac medications before your next appointment, please call your pharmacy*   Lab Work: None ordered  If you have labs (blood work) drawn today and your tests are completely normal, you will receive your results only by: [place] (if you have MyChart) OR A paper copy in the mail If you have any lab test that is abnormal or we need to change your treatment, we will call you to review the results.   Testing/Procedures: Your physician has requested that you have an echocardiogram. Echocardiography is a painless test that uses sound waves to create images of your heart. It provides your doctor with information about the size and shape of your heart and how well your heart's chambers and valves are working. This procedure takes approximately one hour. There are no restrictions for this procedure.  Your physician has requested that you have en exercise stress myoview. For further information please visit [email or web address]. Please follow instruction sheet, BELOW:    You are scheduled for a Myocardial Perfusion Imaging Study  Please arrive 15 minutes prior to your appointment time for registration and insurance purposes.  The test will take approximately 3 to 4 hours to complete; you may bring reading material.  If someone comes with you to your appointment, they will need to remain in the main lobby due to limited space in the testing area. **If you are pregnant or breastfeeding, please notify the nuclear lab prior to your appointment**  How to prepare for your Myocardial Perfusion Test: Do not eat or drink 3 hours prior to your test, except you may have water. Do not consume products containing caffeine (regular or decaffeinated) 12 hours prior to your test. (ex: coffee, chocolate, sodas, tea). Do bring a list  of your current medications with you.  If not listed below, you may take your medications as normal. Do wear comfortable clothes (no dresses or overalls) and walking shoes, tennis shoes preferred (No heels or open toe shoes are allowed). Do NOT wear cologne, perfume, aftershave, or lotions (deodorant is allowed). If these instructions are not followed, your test will have to be rescheduled.    [employer] Cardiac Event Monitor Instructions Your physician has requested you wear your cardiac event monitor for 30 days, (1-30). [employer] may call or text to confirm a shipping address. The monitor will be sent to a land address via UPS. [employer] will not ship a monitor to a PO BOX. It typically takes 3-5 days to receive your monitor after it has been enrolled. [employer] will assist with USPS tracking if your package is delayed. The telephone number for [employer] is [phone]. Once you have received your monitor, please review the enclosed instructions. Instruction tutorials can also be viewed under help and settings on the enclosed cell phone. Your monitor has already been registered assigning a specific monitor serial # to you.  Applying the monitor Remove cell phone from case and turn it on. The cell phone works as Dealer and needs to be within [employer] of you at all times. The cell phone will need to be charged on a daily basis. We recommend you plug the cell phone into the enclosed charger at your bedside table every night.  Monitor batteries: You will receive two monitor batteries labelled #1 and #2. These are your recorders. Plug battery #2  onto the second connection on the enclosed charger. Keep one battery on the charger at all times. This will keep the monitor battery deactivated. It will also keep it fully charged for when you need to switch your monitor batteries. A small light will be blinking on the battery emblem when it is charging. The light on the battery  emblem will remain on when the battery is fully charged.  Open package of a Monitor strip. Insert battery #1 into black hood on strip and gently squeeze monitor battery onto connection as indicated in instruction booklet. Set aside while preparing skin.  Choose location for your strip, vertical or horizontal, as indicated in the instruction booklet. Shave to remove all hair from location. There cannot be any lotions, oils, powders, or colognes on skin where monitor is to be applied. Wipe skin clean with enclosed Saline wipe. Dry skin completely.  Peel paper labeled #1 off the back of the Monitor strip exposing the adhesive. Place the monitor on the chest in the vertical or horizontal position shown in the instruction booklet. One arrow on the monitor strip must be pointing upward. Carefully remove paper labeled #2, attaching remainder of strip to your skin. Try not to create any folds or wrinkles in the strip as you apply it.  Firmly press and release the circle in the center of the monitor battery. You will hear a small beep. This is turning the monitor battery on. The heart emblem on the monitor battery will light up every 5 seconds if the monitor battery in turned on and connected to the patient securely. Do not push and hold the circle down as this turns the monitor battery off. The cell phone will locate the monitor battery. A screen will appear on the cell phone checking the connection of your monitor strip. This may read poor connection initially but change to good connection within the next minute. Once your monitor accepts the connection you will hear a series of 3 beeps followed by a climbing crescendo of beeps. A screen will appear on the cell phone showing the two monitor strip placement options. Touch the picture that demonstrates where you applied the monitor strip.  Your monitor strip and battery are waterproof. You are able to shower, bathe, or swim with the monitor on. They  just ask you do not submerge deeper than 3 feet underwater. We recommend removing the monitor if you are swimming in a lake, river, or ocean.  Your monitor battery will need to be switched to a fully charged monitor battery approximately once a week. The cell phone will alert you of an action which needs to be made.  On the cell phone, tap for details to reveal connection status, monitor battery status, and cell phone battery status. The green dots indicates your monitor is in good status. A red dot indicates there is something that needs your attention.  To record a symptom, click the circle on the monitor battery. In 30-60 seconds a list of symptoms will appear on the cell phone. Select your symptom and tap save. Your monitor will record a sustained or significant arrhythmia regardless of you clicking the button. Some patients do not feel the heart rhythm irregularities. [employer] will notify us of any serious or critical events.  Refer to instruction booklet for instructions on switching batteries, changing strips, the Do not disturb or Pause features, or any additional questions.  Call [employer] at [phone], to confirm your monitor is transmitting and record your baseline. They will  answer any questions you may have regarding the monitor instructions at that time.  Returning the monitor to [place] all equipment back into blue box. Peel off strip of paper to expose adhesive and close box securely. There is a prepaid UPS shipping label on this box. Drop in a UPS drop box, or at a UPS facility like [employer]. You may also contact [employer] to arrange UPS to pick up monitor package at your home.    Follow-Up: At [facility], you and your health needs are our priority.  As part of our continuing mission to provide you with exceptional heart care, we have created designated Provider Care Teams.  These Care Teams include your primary Cardiologist (physician) and Advanced  Practice Providers (APPs -  Physician Assistants and Nurse Practitioners) who all work together to provide you with the care you need, when you need it.  We recommend signing up for the patient portal called "MyChart".  Sign up information is provided on this After Visit Summary.  MyChart is used to connect with patients for Virtual Visits (Telemedicine).  Patients are able to view lab/test results, encounter notes, upcoming appointments, etc.  Non-urgent messages can be sent to your provider as well.   To learn more about what you can do with MyChart, go to [email or web address].    Your next appointment:   6 month(s)  The format for your next appointment:   In Person  Provider:   [name], MD     Other Instructions

## 2021-08-24 NOTE — Progress Notes (Signed)
Patient ID: Richard Ellis, male   DOB: Dec 20, 1945, 75 y.o.   MRN: [id number] Patient enrolled for [employer] to ship a 30 day cardiac event monitor to his address on file.

## 2021-08-24 NOTE — Progress Notes (Signed)
Cardiology Office Note:    Date:  08/24/2021   ID:  Richard Ellis, DOB [birth year], MRN [id number]  PCP:  Cassandria Anger, MD   [facility] Providers Cardiologist:  [name], MD     Referring MD: Cassandria Anger, MD   Chief Complaint  Patient presents with   Transient Ischemic Attack    History of Present Illness:    Richard Ellis is a 75 y.o. male with a hx of: Coronary artery disease Cath 2/17: Severe mid LAD stenosis >> DES to the mid LAD Status post CABG 2018 (LIMA-LAD, SVG-D1) Patent foramen ovale status post surgical closure Prior CVA Cryptogenic CVA 2014 >> PFO with R>L shunt noted; treated with Plavix Hyperlipidemia Intol of statins, PCSK9i  >> Diet Rx Hypothyroidism Anemia Bradycardia -no beta-blocker  75 year old male whom I have followed for many years for a PFO.  He had a cryptogenic stroke in 2014 and was started on clopidogrel.  He did well until he developed angina in 2017.  He was treated with PCI and stenting of the LAD at that time.  He ultimately required coronary bypass surgery in 2018 and was treated with a LIMA to LAD graft and a saphenous vein graft to the first diagonal.  He also underwent surgical PFO closure at the time of bypass surgery.  The patient has a long history of statin intolerance.  He has been started on Praluent with good treatment response.  About 48 hours ago he developed sudden onset of right hand and arm numbness and tingling.  He had some clumsiness of the arm as well.  He developed both expressive and receptive aphasia.  His symptoms lasted for less than 1 hour.  He was evaluated in the emergency room and diagnosed with a TIA.  A CT scan of the brain and an MRI of the brain were essentially unremarkable with no acute infarct demonstrated.  The patient has had no recurrent symptoms and is back to his normal state of health.  He does admit to some chest discomfort over the last few weeks.  He has had episodes of left-sided  chest pain that occur with no clear relationship to physical exertion.  Some of his symptoms have felt similar to his past angina.  He denies dyspnea, heart palpitations, lightheadedness, orthopnea, PND, or syncope.  Past Medical History:  Diagnosis Date   Allergy    rhinitis   Anemia    "Hgb always on the low side" (07/02/2013)   CAD (coronary artery disease)    a. LHC 2/17: pLAD 50, mLAD 95, pRCA 30, dRCA 25, EF 50-55% >> PCI:  3 x 18 mm Resolute DES to mLAD    Chronic bronchitis ([place])    "not in the last year since I started taking allergy shots" (10/09/2016)   Chronic lower back pain    Chronic sinus bradycardia 07/02/2013   Coronary artery disease 10/15/2016   Cryptogenic stroke ([place]) 07/02/2013   a. "small ischemic occipital right sided" (07/02/2013)  //  b. Event monitor 10/14: sinus brady  //  c. Carotid US 10/14: bilat ICA 1-39%   Esophageal stricture    Exertional angina ([place]) 10/09/2016   Heart murmur    "as a child" (07/02/2013)   History of echocardiogram    a. Echo 10/14: EF 60-65%, no RWMA, normal diastolic function   Hyperlipidemia    Hypothyroidism    Nonallopathic lesion of lumbosacral region 06/04/2017   Nonallopathic lesion of sacral region 06/04/2017   Nonallopathic  lesion of thoracic region 06/04/2017   Osteoarthritis    "lower back" (10/09/2016)   PFO (patent foramen ovale)    a. TEE 11/14: mild LVH, EF 60-65%, small PFO, R-L shunt    Past Surgical History:  Procedure Laterality Date   CARDIAC CATHETERIZATION N/A 11/10/2015   Procedure: Left Heart Cath and Coronary Angiography;  Surgeon: [name], MD;  Location: [place] [facility];  Service: Cardiovascular;  Laterality: N/A;   CARDIAC CATHETERIZATION N/A 11/10/2015   Procedure: Coronary Stent Intervention;  Surgeon: [name], MD;  Location: [place] [facility];  Service: Cardiovascular;  Laterality: N/A;  mid lad  3.0x18 resolute   CARDIAC CATHETERIZATION  10/09/2016   "scheduled OHS for tomorrow"  (10/09/2016)   CARDIAC CATHETERIZATION N/A 10/09/2016   Procedure: Left Heart Cath and Coronary Angiography;  Surgeon: [name], MD;  Location: [place] [facility];  Service: Cardiovascular;  Laterality: N/A;   CARDIAC CATHETERIZATION N/A 10/09/2016   Procedure: Coronary Balloon Angioplasty;  Surgeon: [name], MD;  Location: [place] [facility];  Service: Cardiovascular;  Laterality: N/A;   CORONARY ARTERY BYPASS GRAFT N/A 10/15/2016   Procedure: CORONARY ARTERY BYPASS GRAFTING (CABG), ON PUMP, TIMES TWO, USING LEFT INTERNAL MAMMARY ARTERY AND RIGHT GREATER SAPHENOUS VEIN HARVESTED ENDOSCOPICALLY;  Surgeon: Ivin Poot, MD;  Location: [place];  Service: Open Heart Surgery;  Laterality: N/A;  LIMA to LAD, SVG to DIAGONAL   ESOPHAGOGASTRODUODENOSCOPY N/A 10/26/2012   Procedure: ESOPHAGOGASTRODUODENOSCOPY (EGD);  Surgeon: Winfield Cunas., MD;  Location[facility];  Service: Endoscopy;  Laterality: N/A;   ESOPHAGOGASTRODUODENOSCOPY N/A 07/08/2013   Procedure: ESOPHAGOGASTRODUODENOSCOPY (EGD);  Surgeon: Winfield Cunas., MD;  Location: [facility];  Service: Endoscopy;  Laterality: N/A;   INNER EAR SURGERY Left 1957   "related to ear infection"   REPAIR OF PATENT FORAMEN OVALE N/A 10/15/2016   Procedure: REPAIR OF PATENT FORAMEN OVALE;  Surgeon: Ivin Poot, MD;  Location: [place];  Service: Open Heart Surgery;  Laterality: N/A;   SAVORY DILATION N/A 07/08/2013   Procedure: SAVORY DILATION;  Surgeon: Winfield Cunas., MD;  Location: [facility] ENDOSCOPY;  Service: Endoscopy;  Laterality: N/A;   TEE WITHOUT CARDIOVERSION N/A 07/28/2013   Procedure: TRANSESOPHAGEAL ECHOCARDIOGRAM (TEE);  Surgeon: Pixie Casino, MD;  Location: [facility] END[facility]COPY;  Service: Cardiovascular;  Laterality: N/A;   TEE WITHOUT CARDIOVERSION N/A 10/15/2016   Procedure: TRANSESOPHAGEAL ECHOCARDIOGRAM (TEE);  Surgeon: Ivin Poot, MD;  Location: [place];  Service: Open Heart Surgery;  Laterality: N/A;   TONSILLECTOMY   1950's    Current Medications: Current Meds  Medication Sig   Alirocumab (PRALUENT) 75 MG/ML SOAJ INJECT 1 ML INTO THE SKIN EVERY 14 (FOURTEEN) DAYS.   aspirin EC 81 MG tablet Take 1 tablet (81 mg total) by mouth daily.   budesonide-formoterol (SYMBICORT) 80-4.5 MCG/ACT inhaler Take 2 puffs first thing in am and then another 2 puffs about 12 hours later.   Cholecalciferol 2000 UNITS CAPS Take 2,000 Units by mouth every evening.   clopidogrel (PLAVIX) 75 MG tablet Take 1 tablet (75 mg total) by mouth daily.   Ferrous Sulfate (IRON) 325 (65 Fe) MG TABS Take 1 tablet by mouth. Taking three times a week   levothyroxine (SYNTHROID) 25 MCG tablet Take 25 mcg by mouth daily before breakfast. Per patient taking 2 tablets on Monday-Saturday, do not take on Sunday   nitroGLYCERIN (NITROSTAT) 0.4 MG SL tablet Place 1 tablet (0.4 mg total) under the tongue every 5 (  five) minutes as needed for chest pain.   Omega-3 Fatty Acids (FISH OIL) 1000 MG CAPS Take 1 capsule by mouth daily.   Turmeric 500 MG CAPS Take 1 capsule by mouth at bedtime.   [DISCONTINUED] pantoprazole (PROTONIX) 40 MG tablet TAKE 1 TABLET BY MOUTH EVERY DAY *ANNUAL APPT W/LABS DUE IN MARCH MUST SEE PROVIDER FOR REFILLS*     Allergies:   Niacin er, Statins, Atorvastatin, Crestor [rosuvastatin], Demerol [meperidine], Niacin and related, and Repatha [evolocumab]   Social History   Socioeconomic History   Marital status: Married    Spouse name: Not on file   Number of children: 1   Years of education: Not on file   Highest education level: Not on file  Occupational History   Occupation: Retired  Tobacco Use   Smoking status: Never   Smokeless tobacco: Never  Vaping Use   Vaping Use: Never used  Substance and Sexual Activity   Alcohol use: Yes    Alcohol/week: 8.0 standard drinks    Types: 4 Glasses of wine, 4 Cans of beer per week    Comment: weekly   Drug use: No   Sexual activity: Yes  Other Topics Concern   Not on file   Social History Narrative   Not on file   Social Determinants of Health   Financial Resource Strain: Low Risk    Difficulty of Paying Living Expenses: Not hard at all  Food Insecurity: No Food Insecurity   Worried About Charity fundraiser in the Last Year: Never true   Ran Out of Food in the Last Year: Never true  Transportation Needs: No Transportation Needs   Lack of Transportation (Medical): No   Lack of Transportation (Non-Medical): No  Physical Activity: Sufficiently Active   Days of Exercise per Week: 7 days   Minutes of Exercise per Session: 30 min  Stress: No Stress Concern Present   Feeling of Stress : Not at all  Social Connections: Socially Integrated   Frequency of Communication with Friends and Family: More than three times a week   Frequency of Social Gatherings with Friends and Family: Twice a week   Attends Religious Services: More than 4 times per year   Active Member of [employer] or Organizations: Yes   Attends Music therapist: More than 4 times per year   Marital Status: Married     Family History: The patient's family history includes COPD in his mother; Coronary artery disease in an other family member; Heart disease in his father and paternal uncle. There is no history of Colon cancer.  ROS:   Please see the history of present illness.    All other systems reviewed and are negative.  EKGs/Labs/Other Studies Reviewed:    EKG:  EKG is reviewed from the emergency room 08/21/2021.  This demonstrates normal sinus rhythm, age-indeterminate anterior infarct, left posterior fascicular block.  Recent Labs: 01/13/2021: TSH 3.33 08/21/2021: ALT 21; BUN 19; Creatinine, Ser 1.10; Hemoglobin 13.9; Platelets 215; Potassium 3.9; Sodium 138  Recent Lipid Panel    Component Value Date/Time   CHOL 209 (H) 07/10/2021 0835   CHOL 230 (H) 11/09/2014 1200   TRIG 211 (H) 07/10/2021 0835   TRIG 152 (H) 11/09/2014 1200   TRIG 228 (HH) 07/30/2006 0741   HDL 51  07/10/2021 0835   HDL 64 11/09/2014 1200   CHOLHDL 4.1 07/10/2021 0835   CHOLHDL 7 01/13/2021 0809   VLDL 27.2 01/13/2021 0809   LDLCALC 121 (H) 07/10/2021 6962  LDLCALC 136 (H) 11/09/2014 1200   LDLDIRECT 122.0 05/09/2015 1006     Risk Assessment/Calculations:           Physical Exam:    VS:  BP 120/68   Pulse (!) 50   Ht 6\' 1"  (1.854 m)   Wt 192 lb 6.4 oz (87.3 kg)   SpO2 100%   BMI 25.38 kg/m     Wt Readings from Last 3 Encounters:  08/24/21 192 lb 6.4 oz (87.3 kg)  08/21/21 185 lb (83.9 kg)  04/14/21 184 lb (83.5 kg)     GEN:  Well nourished, well developed in no acute distress HEENT: Normal NECK: No JVD; No carotid bruits LYMPHATICS: No lymphadenopathy CARDIAC: RRR, 2/6 systolic murmur at the apex RESPIRATORY:  Clear to auscultation without rales, wheezing or rhonchi  ABDOMEN: Soft, non-tender, non-distended MUSCULOSKELETAL:  No edema; No deformity  SKIN: Warm and dry NEUROLOGIC:  Alert and oriented x 3 PSYCHIATRIC:  Normal affect   ASSESSMENT:    1. TIA (transient ischemic attack)   2. Coronary artery disease involving native coronary artery of native heart with angina pectoris ([place])   3. Mixed hyperlipidemia    PLAN:    In order of problems listed above:  Patient on aspirin and lipid-lowering therapy with Praluent.  He is statin intolerant.  Brain imaging was negative at the time of his event.  He has upcoming neurology consultation with Dr. Leonie Man.  I will go ahead and start him on clopidogrel 75 mg daily in addition to his low-dose aspirin.  This may be continued for 3 weeks or at the discretion of Dr. Leonie Man.  Further work-up will be completed.  I have recommended a 2D echocardiogram to evaluate for cardiac source of embolus and a 30-day event monitor to evaluate for atrial fibrillation.  We discussed consideration of an implantable loop recorder, but we will start with an event monitor.  The patient has undergone surgical PFO closure.  I will defer  carotid imaging to Dr. Leonie Man as I am not sure whether he will want to do a carotid ultrasound or CTA study. The patient is experiencing chest pain with typical and atypical features.  He will continue his current medical program.  He is not a beta-blocker candidate because of bradycardia.  We will check an exercise Myoview stress test to evaluate for ischemia. Treated with Praluent.  Statin intolerant.  Good initial response to Praluent, will continue for now.  LDL remains above goal and he could be considered for a medication such as inclisiran if he is willing. Follow-up lipid clinic.      Medication Adjustments/Labs and Tests Ordered: Current medicines are reviewed at length with the patient today.  Concerns regarding medicines are outlined above.  Orders Placed This Encounter  Procedures   MYOCARDIAL PERFUSION IMAGING   Cardiac event monitor   ECHOCARDIOGRAM COMPLETE    Meds ordered this encounter  Medications   clopidogrel (PLAVIX) 75 MG tablet    Sig: Take 1 tablet (75 mg total) by mouth daily.    Dispense:  90 tablet    Refill:  3   nitroGLYCERIN (NITROSTAT) 0.4 MG SL tablet    Sig: Place 1 tablet (0.4 mg total) under the tongue every 5 (five) minutes as needed for chest pain.    Dispense:  90 tablet    Refill:  3     Patient Instructions  Medication Instructions:  Your physician has recommended you make the following change in your medication:  START Plavix 75 mg taking 1 daily   *If you need a refill on your cardiac medications before your next appointment, please call your pharmacy*   Lab Work: None ordered  If you have labs (blood work) drawn today and your tests are completely normal, you will receive your results only by: [place] (if you have MyChart) OR A paper copy in the mail If you have any lab test that is abnormal or we need to change your treatment, we will call you to review the results.   Testing/Procedures: Your physician has requested that  you have an echocardiogram. Echocardiography is a painless test that uses sound waves to create images of your heart. It provides your doctor with information about the size and shape of your heart and how well your heart's chambers and valves are working. This procedure takes approximately one hour. There are no restrictions for this procedure.  Your physician has requested that you have en exercise stress myoview. For further information please visit [email or web address]. Please follow instruction sheet, BELOW:    You are scheduled for a Myocardial Perfusion Imaging Study  Please arrive 15 minutes prior to your appointment time for registration and insurance purposes.  The test will take approximately 3 to 4 hours to complete; you may bring reading material.  If someone comes with you to your appointment, they will need to remain in the main lobby due to limited space in the testing area. **If you are pregnant or breastfeeding, please notify the nuclear lab prior to your appointment**  How to prepare for your Myocardial Perfusion Test: Do not eat or drink 3 hours prior to your test, except you may have water. Do not consume products containing caffeine (regular or decaffeinated) 12 hours prior to your test. (ex: coffee, chocolate, sodas, tea). Do bring a list of your current medications with you.  If not listed below, you may take your medications as normal. Do wear comfortable clothes (no dresses or overalls) and walking shoes, tennis shoes preferred (No heels or open toe shoes are allowed). Do NOT wear cologne, perfume, aftershave, or lotions (deodorant is allowed). If these instructions are not followed, your test will have to be rescheduled.    [employer] Cardiac Event Monitor Instructions Your physician has requested you wear your cardiac event monitor for 30 days, (1-30). [employer] may call or text to confirm a shipping address. The monitor will be sent to a land address via  UPS. [employer] will not ship a monitor to a PO BOX. It typically takes 3-5 days to receive your monitor after it has been enrolled. [employer] will assist with USPS tracking if your package is delayed. The telephone number for [employer] is [phone]. Once you have received your monitor, please review the enclosed instructions. Instruction tutorials can also be viewed under help and settings on the enclosed cell phone. Your monitor has already been registered assigning a specific monitor serial # to you.  Applying the monitor Remove cell phone from case and turn it on. The cell phone works as Dealer and needs to be within [employer] of you at all times. The cell phone will need to be charged on a daily basis. We recommend you plug the cell phone into the enclosed charger at your bedside table every night.  Monitor batteries: You will receive two monitor batteries labelled #1 and #2. These are your recorders. Plug battery #2 onto the second connection on the enclosed charger. Keep one battery on the charger at all  times. This will keep the monitor battery deactivated. It will also keep it fully charged for when you need to switch your monitor batteries. A small light will be blinking on the battery emblem when it is charging. The light on the battery emblem will remain on when the battery is fully charged.  Open package of a Monitor strip. Insert battery #1 into black hood on strip and gently squeeze monitor battery onto connection as indicated in instruction booklet. Set aside while preparing skin.  Choose location for your strip, vertical or horizontal, as indicated in the instruction booklet. Shave to remove all hair from location. There cannot be any lotions, oils, powders, or colognes on skin where monitor is to be applied. Wipe skin clean with enclosed Saline wipe. Dry skin completely.  Peel paper labeled #1 off the back of the Monitor strip exposing the adhesive. Place  the monitor on the chest in the vertical or horizontal position shown in the instruction booklet. One arrow on the monitor strip must be pointing upward. Carefully remove paper labeled #2, attaching remainder of strip to your skin. Try not to create any folds or wrinkles in the strip as you apply it.  Firmly press and release the circle in the center of the monitor battery. You will hear a small beep. This is turning the monitor battery on. The heart emblem on the monitor battery will light up every 5 seconds if the monitor battery in turned on and connected to the patient securely. Do not push and hold the circle down as this turns the monitor battery off. The cell phone will locate the monitor battery. A screen will appear on the cell phone checking the connection of your monitor strip. This may read poor connection initially but change to good connection within the next minute. Once your monitor accepts the connection you will hear a series of 3 beeps followed by a climbing crescendo of beeps. A screen will appear on the cell phone showing the two monitor strip placement options. Touch the picture that demonstrates where you applied the monitor strip.  Your monitor strip and battery are waterproof. You are able to shower, bathe, or swim with the monitor on. They just ask you do not submerge deeper than 3 feet underwater. We recommend removing the monitor if you are swimming in a lake, river, or ocean.  Your monitor battery will need to be switched to a fully charged monitor battery approximately once a week. The cell phone will alert you of an action which needs to be made.  On the cell phone, tap for details to reveal connection status, monitor battery status, and cell phone battery status. The green dots indicates your monitor is in good status. A red dot indicates there is something that needs your attention.  To record a symptom, click the circle on the monitor battery. In 30-60  seconds a list of symptoms will appear on the cell phone. Select your symptom and tap save. Your monitor will record a sustained or significant arrhythmia regardless of you clicking the button. Some patients do not feel the heart rhythm irregularities. [employer] will notify us of any serious or critical events.  Refer to instruction booklet for instructions on switching batteries, changing strips, the Do not disturb or Pause features, or any additional questions.  Call [employer] at [phone], to confirm your monitor is transmitting and record your baseline. They will answer any questions you may have regarding the monitor instructions at that time.  Returning the  monitor to [employer] Place all equipment back into blue box. Peel off strip of paper to expose adhesive and close box securely. There is a prepaid UPS shipping label on this box. Drop in a UPS drop box, or at a UPS facility like [employer]. You may also contact [employer] to arrange UPS to pick up monitor package at your home.    Follow-Up: At [facility], you and your health needs are our priority.  As part of our continuing mission to provide you with exceptional heart care, we have created designated Provider Care Teams.  These Care Teams include your primary Cardiologist (physician) and Advanced Practice Providers (APPs -  Physician Assistants and Nurse Practitioners) who all work together to provide you with the care you need, when you need it.  We recommend signing up for the patient portal called "MyChart".  Sign up information is provided on this After Visit Summary.  MyChart is used to connect with patients for Virtual Visits (Telemedicine).  Patients are able to view lab/test results, encounter notes, upcoming appointments, etc.  Non-urgent messages can be sent to your provider as well.   To learn more about what you can do with MyChart, go to [email or web address].    Your next appointment:   6 month(s)  The  format for your next appointment:   In Person  Provider:   [name], MD     Other Instructions    Signed, [name], MD  08/24/2021 2:55 PM    [place]

## 2021-08-24 NOTE — Telephone Encounter (Signed)
Called pt made appt for 08/31/21.Marland KitchenJohny Ellis

## 2021-08-28 NOTE — Addendum Note (Signed)
Addended by: Gaetano Net on: 08/28/2021 12:27 PM   Modules accepted: Orders

## 2021-08-29 ENCOUNTER — Telehealth: Payer: Self-pay | Admitting: Neurology

## 2021-08-29 ENCOUNTER — Ambulatory Visit (INDEPENDENT_AMBULATORY_CARE_PROVIDER_SITE_OTHER): Payer: Medicare Other | Admitting: Neurology

## 2021-08-29 ENCOUNTER — Other Ambulatory Visit: Payer: Self-pay

## 2021-08-29 ENCOUNTER — Encounter: Payer: Self-pay | Admitting: Neurology

## 2021-08-29 ENCOUNTER — Other Ambulatory Visit: Payer: Self-pay | Admitting: Cardiovascular Disease

## 2021-08-29 VITALS — BP 150/85 | HR 46 | Ht 73.0 in | Wt 192.6 lb

## 2021-08-29 DIAGNOSIS — G43009 Migraine without aura, not intractable, without status migrainosus: Secondary | ICD-10-CM | POA: Diagnosis not present

## 2021-08-29 DIAGNOSIS — I25810 Atherosclerosis of coronary artery bypass graft(s) without angina pectoris: Secondary | ICD-10-CM | POA: Diagnosis not present

## 2021-08-29 DIAGNOSIS — R410 Disorientation, unspecified: Secondary | ICD-10-CM | POA: Diagnosis not present

## 2021-08-29 DIAGNOSIS — G459 Transient cerebral ischemic attack, unspecified: Secondary | ICD-10-CM

## 2021-08-29 DIAGNOSIS — R569 Unspecified convulsions: Secondary | ICD-10-CM | POA: Diagnosis not present

## 2021-08-29 DIAGNOSIS — R4789 Other speech disturbances: Secondary | ICD-10-CM | POA: Diagnosis not present

## 2021-08-29 DIAGNOSIS — I25119 Atherosclerotic heart disease of native coronary artery with unspecified angina pectoris: Secondary | ICD-10-CM

## 2021-08-29 NOTE — Patient Instructions (Signed)
I had a long discussion with patient and his wife regarding his transient episode of fleeting paresthesias followed by garbled speech and disorientation reported during possible atypical migraine versus complex partial seizure and TIA is less likely.  Recommend further evaluation with checking EEG as well as MR angiogram study of the brain and neck and continue ongoing plan for echocardiogram and cardiac monitoring.  He will continue on aspirin and Plavix for 3 weeks and then stop Plavix and stay on aspirin alone and maintain aggressive risk factor modification with strict control of hypertension with blood pressure goal below 130/90, lipids with LDL cholesterol goal below 70 mg percent and diabetes with hemoglobin A1c goal below 6.5%.  He was also advised to increase participation in stress laxation activities like meditation yoga and exercise.  Return for follow-up in the future in 3 months or call earlier if necessary.
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
## 2021-08-29 NOTE — Telephone Encounter (Signed)
Medicare/bcbs fed order sent to [facility], [facility] they will reach out to the patient to schedule.

## 2021-08-30 ENCOUNTER — Ambulatory Visit (HOSPITAL_COMMUNITY): Payer: Medicare Other | Attending: Cardiology

## 2021-08-30 ENCOUNTER — Other Ambulatory Visit: Payer: Self-pay

## 2021-08-30 DIAGNOSIS — G459 Transient cerebral ischemic attack, unspecified: Secondary | ICD-10-CM | POA: Insufficient documentation

## 2021-08-30 DIAGNOSIS — I25119 Atherosclerotic heart disease of native coronary artery with unspecified angina pectoris: Secondary | ICD-10-CM | POA: Insufficient documentation

## 2021-08-30 DIAGNOSIS — E782 Mixed hyperlipidemia: Secondary | ICD-10-CM | POA: Diagnosis not present

## 2021-08-30 LAB — ECHOCARDIOGRAM COMPLETE
Area-P 1/2: 2.21 cm2
S' Lateral: 3.7 cm

## 2021-08-31 ENCOUNTER — Encounter: Payer: Self-pay | Admitting: Internal Medicine

## 2021-08-31 ENCOUNTER — Ambulatory Visit (INDEPENDENT_AMBULATORY_CARE_PROVIDER_SITE_OTHER): Payer: Medicare Other | Admitting: Internal Medicine

## 2021-08-31 DIAGNOSIS — G459 Transient cerebral ischemic attack, unspecified: Secondary | ICD-10-CM

## 2021-08-31 DIAGNOSIS — I4891 Unspecified atrial fibrillation: Secondary | ICD-10-CM | POA: Diagnosis not present

## 2021-08-31 DIAGNOSIS — R001 Bradycardia, unspecified: Secondary | ICD-10-CM

## 2021-08-31 DIAGNOSIS — F419 Anxiety disorder, unspecified: Secondary | ICD-10-CM

## 2021-08-31 DIAGNOSIS — I25810 Atherosclerosis of coronary artery bypass graft(s) without angina pectoris: Secondary | ICD-10-CM | POA: Diagnosis not present

## 2021-08-31 MED ORDER — ALPRAZOLAM 0.5 MG PO TABS: 0.5000 mg | ORAL_TABLET | Freq: Three times a day (TID) | ORAL | 0 refills | Status: DC | PRN
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
## 2021-08-31 NOTE — Patient Instructions (Signed)
Imodium AD, Pepcid AC

## 2021-09-03 ENCOUNTER — Ambulatory Visit (INDEPENDENT_AMBULATORY_CARE_PROVIDER_SITE_OTHER): Payer: Medicare Other

## 2021-09-03 DIAGNOSIS — I25119 Atherosclerotic heart disease of native coronary artery with unspecified angina pectoris: Secondary | ICD-10-CM

## 2021-09-03 DIAGNOSIS — I4891 Unspecified atrial fibrillation: Secondary | ICD-10-CM

## 2021-09-03 DIAGNOSIS — R001 Bradycardia, unspecified: Secondary | ICD-10-CM

## 2021-09-03 DIAGNOSIS — G459 Transient cerebral ischemic attack, unspecified: Secondary | ICD-10-CM

## 2021-09-03 DIAGNOSIS — E782 Mixed hyperlipidemia: Secondary | ICD-10-CM

## 2021-09-04 NOTE — Addendum Note (Signed)
Addended by: [name] on: 09/04/2021 01:43 PM   Modules accepted: Orders

## 2021-09-05 ENCOUNTER — Ambulatory Visit (INDEPENDENT_AMBULATORY_CARE_PROVIDER_SITE_OTHER): Payer: Medicare Other | Admitting: Neurology

## 2021-09-05 ENCOUNTER — Other Ambulatory Visit: Payer: Self-pay

## 2021-09-05 DIAGNOSIS — R569 Unspecified convulsions: Secondary | ICD-10-CM

## 2021-09-19 ENCOUNTER — Other Ambulatory Visit: Payer: Self-pay

## 2021-09-19 ENCOUNTER — Ambulatory Visit
Admission: RE | Admit: 2021-09-19 | Discharge: 2021-09-19 | Disposition: A | Discharge status: Home / Self Care | Payer: Medicare Other | Source: Ambulatory Visit | Attending: Neurology | Admitting: Neurology

## 2021-09-19 DIAGNOSIS — G459 Transient cerebral ischemic attack, unspecified: Secondary | ICD-10-CM | POA: Diagnosis not present

## 2021-09-19 MED ORDER — GADOBENATE DIMEGLUMINE 529 MG/ML IV SOLN
17.0000 mL | Freq: Once | INTRAVENOUS | Status: AC | PRN
  Administered 2021-09-19: 17 mL via INTRAVENOUS

## 2021-09-20 ENCOUNTER — Encounter (HOSPITAL_COMMUNITY): Payer: Self-pay | Admitting: *Deleted

## 2021-09-20 ENCOUNTER — Telehealth (HOSPITAL_COMMUNITY): Payer: Self-pay | Admitting: *Deleted

## 2021-09-20 NOTE — Telephone Encounter (Signed)
Patient given detailed instructions per Myocardial Perfusion Study Information Sheet for the test on 09/26/21 at 1015. Patient notified to arrive 15 minutes early and that it is imperative to arrive on time for appointment to keep from having the test rescheduled.  If you need to cancel or reschedule your appointment, please call the office within 24 hours of your appointment. . Patient verbalized understanding.Floye Fesler, [facility] Mychart letter sent.

## 2021-09-27 ENCOUNTER — Other Ambulatory Visit: Payer: Self-pay

## 2021-09-27 ENCOUNTER — Ambulatory Visit (HOSPITAL_COMMUNITY): Payer: Medicare Other | Attending: Cardiovascular Disease

## 2021-09-27 DIAGNOSIS — I25119 Atherosclerotic heart disease of native coronary artery with unspecified angina pectoris: Secondary | ICD-10-CM | POA: Diagnosis not present

## 2021-09-27 DIAGNOSIS — E782 Mixed hyperlipidemia: Secondary | ICD-10-CM | POA: Insufficient documentation

## 2021-09-27 DIAGNOSIS — G459 Transient cerebral ischemic attack, unspecified: Secondary | ICD-10-CM | POA: Diagnosis not present

## 2021-09-27 LAB — MYOCARDIAL PERFUSION IMAGING
Angina Index: 0
Duke Treadmill Score: 11
Estimated workload: 13.4
Exercise duration (min): 11 min
Exercise duration (sec): 1 s
LV dias vol: 109 mL (ref 62–150)
LV sys vol: 67 mL
MPHR: 145 {beats}/min
Nuc Stress EF: 38 %
Peak HR: 142 {beats}/min
Percent HR: 97 %
Rest HR: 51 {beats}/min
Rest Nuclear Isotope Dose: 10.8 mCi
SDS: 1
SRS: 0
SSS: 1
ST Depression (mm): 0 mm
Stress Nuclear Isotope Dose: 32.2 mCi
TID: 0.95

## 2021-09-27 MED ORDER — TECHNETIUM TC 99M TETROFOSMIN IV KIT
10.8000 | PACK | Freq: Once | INTRAVENOUS | Status: AC | PRN
  Administered 2021-09-27: 10.8 via INTRAVENOUS
  Filled 2021-09-27: qty 11

## 2021-09-27 MED ORDER — TECHNETIUM TC 99M TETROFOSMIN IV KIT
32.2000 | PACK | Freq: Once | INTRAVENOUS | Status: AC | PRN
  Administered 2021-09-27: 32.2 via INTRAVENOUS
  Filled 2021-09-27: qty 33

## 2021-10-02 ENCOUNTER — Telehealth: Payer: Self-pay

## 2021-10-02 DIAGNOSIS — I25810 Atherosclerosis of coronary artery bypass graft(s) without angina pectoris: Secondary | ICD-10-CM

## 2021-10-02 MED ORDER — ENTRESTO 24-26 MG PO TABS: 1.0000 | ORAL_TABLET | Freq: Two times a day (BID) | ORAL | 0 refills | Status: AC

## 2021-10-02 NOTE — Telephone Encounter (Signed)
Spoke with patient regarding ECHO results and advice to begin [facility] per Dr Burt Knack. Pt agrees with plan and verbalized understanding that he will increase fluids and monitor for signs of low BP (dizziness, confusion, tiredness, lethargy). Pt understands that PharmD clinic will follow and RX has been sent to pharmacy on file.

## 2021-10-02 NOTE — Telephone Encounter (Signed)
-----   Message from [name], MD sent at 10/01/2021  9:27 AM EST ----- Reviewed echo findings and nuclear results. Pt noted to have mild/moderate LV dysfunction on both studies. No evidence of lack of blood flow. Should be started on medical therapy. Not a candidate for beta blockade because of resting bradycardia. Recommend trial of entresto 24/26 mg as tolerated. He will need to push fluids and stay well hydrated as he has not been hypertensive. Please arrange PharmD follow-up for med titration. thanks

## 2021-10-04 ENCOUNTER — Other Ambulatory Visit: Payer: Self-pay | Admitting: Cardiovascular Disease

## 2021-10-04 DIAGNOSIS — I4891 Unspecified atrial fibrillation: Secondary | ICD-10-CM

## 2021-10-04 DIAGNOSIS — R001 Bradycardia, unspecified: Secondary | ICD-10-CM

## 2021-10-04 DIAGNOSIS — E782 Mixed hyperlipidemia: Secondary | ICD-10-CM

## 2021-10-04 DIAGNOSIS — G459 Transient cerebral ischemic attack, unspecified: Secondary | ICD-10-CM

## 2021-10-04 DIAGNOSIS — I25119 Atherosclerotic heart disease of native coronary artery with unspecified angina pectoris: Secondary | ICD-10-CM

## 2021-10-16 ENCOUNTER — Other Ambulatory Visit: Payer: Self-pay

## 2021-10-16 ENCOUNTER — Encounter: Payer: Self-pay | Admitting: Nurse Practitioner

## 2021-10-16 ENCOUNTER — Ambulatory Visit: Payer: Medicare Other | Admitting: Internal Medicine

## 2021-10-16 ENCOUNTER — Ambulatory Visit (INDEPENDENT_AMBULATORY_CARE_PROVIDER_SITE_OTHER): Payer: Medicare Other | Admitting: Nurse Practitioner

## 2021-10-16 VITALS — BP 120/70 | HR 53 | Temp 98.4°F | Ht 73.0 in | Wt 193.2 lb

## 2021-10-16 DIAGNOSIS — J4520 Mild intermittent asthma, uncomplicated: Secondary | ICD-10-CM

## 2021-10-16 DIAGNOSIS — R053 Chronic cough: Secondary | ICD-10-CM | POA: Diagnosis not present

## 2021-10-16 DIAGNOSIS — I25810 Atherosclerosis of coronary artery bypass graft(s) without angina pectoris: Secondary | ICD-10-CM | POA: Diagnosis not present

## 2021-10-16 LAB — NITRIC OXIDE: FeNO level (ppb): 29

## 2021-10-16 MED ORDER — MONTELUKAST SODIUM 10 MG PO TABS: 10.0000 mg | ORAL_TABLET | Freq: Every day | ORAL | 6 refills | Status: DC

## 2021-10-16 MED ORDER — BUDESONIDE-FORMOTEROL FUMARATE 80-4.5 MCG/ACT IN AERO: 2.0000 | INHALATION_SPRAY | Freq: Every day | RESPIRATORY_TRACT | 6 refills | Status: DC

## 2021-10-16 NOTE — Patient Instructions (Addendum)
-  Continue Symbicort 80 1 puff Twice daily. Brush tongue and rinse mouth afterwards.  -Continue Albuterol inhaler 2 puffs every 6 hours as needed for shortness of breath or wheezing. Notify if symptoms persist despite rescue inhaler use. Can use 15 min prior to exercise  -Start Singulair 10 mg At bedtime  -Can use Zyrtec 10 mg daily or other second generation antihistamine over the counter during allergy season if allergy symptoms develop or breathing/cough worsen  Asthma Action Plan in place Rinse mouth after inhaled corticosteroid use.  Avoid triggers, when able.  Exercise encouraged. Notify if worsening symptoms upon exertion.  Notify and seek help if symptoms unrelieved by rescue inhaler.  Follow up in 3 months with Dr. Melvyn Novas or Alanson Aly. If symptoms do not improve or worsen, please contact office for sooner follow up or seek emergency care.

## 2021-10-16 NOTE — Assessment & Plan Note (Addendum)
Cough improved on Symbicort. FeNO slightly elevated today at 29 ppb. Prev eos 200. Will start Singulair At bedtime as pt would like to come off of Symbicort if possible. Discussed that he will likely need it PRN, if we are able to get him off daily dosing. Continue on Symbicort 1 puff Twice daily. Re-evalute for step down at next visit.   Patient Instructions  -Continue Symbicort 80 1 puff Twice daily. Brush tongue and rinse mouth afterwards.  -Continue Albuterol inhaler 2 puffs every 6 hours as needed for shortness of breath or wheezing. Notify if symptoms persist despite rescue inhaler use. Can use 15 min prior to exercise  -Start Singulair 10 mg At bedtime  -Can use Zyrtec 10 mg daily or other second generation antihistamine over the counter during allergy season if allergy symptoms develop or breathing/cough worsen  Asthma Action Plan in place Rinse mouth after inhaled corticosteroid use.  Avoid triggers, when able.  Exercise encouraged. Notify if worsening symptoms upon exertion.  Notify and seek help if symptoms unrelieved by rescue inhaler.  Follow up in 3 months with Dr. Melvyn Novas or Alanson Aly. If symptoms do not improve or worsen, please contact office for sooner follow up or seek emergency care.

## 2021-10-16 NOTE — Assessment & Plan Note (Signed)
Suspected to be r/t small airways dx and likely asthma. Improved on Symbicort. Will continue today and add Singulair as FeNO slightly elevated and Eos previously 200. Re-evaluate for step down at next visit.

## 2021-10-16 NOTE — Progress Notes (Signed)
@Patient  ID: Richard Ellis, male    DOB: [birth year], 76 y.o.   MRN: [id number]  Chief Complaint  Patient presents with   Follow-up    cough    Referring provider: Plotnikov, Evie Lacks, MD  HPI: 76 year old male, never smoker followed for cough variant asthma.  He is a patient Dr. Gustavus Bryant and was last seen in office on 04/15/2019.  Past medical history significant for chronic sinus bradycardia, PFO, CAD status post CABG, hypertension, hypothyroid, anxiety, osteoarthritis, HLD, fatigue.   TEST/EVENTS:  10/11/2016 PFTs: FVC 5.23 (106), FEV1 4.63 (127), ratio 89, DLCO 103%, DLCO corrected for alveolar volume 76%.  No BD. 10/14/2020 HRCT chest: Atherosclerosis.  Three-vessel coronary artery calcifications.  No evidence of fibrotic interstitial lung disease.  Mild lobular air trapping on expiratory phase imaging suggestive of small airway disease. 08/30/2021 echocardiogram: EF 45-50%.  Mild LVH.  G1 DD.  RV function slightly reduced.  LA mildly dilated.  AVR trivial.  Mild dilatation of ascending aorta.  Started on [facility] by cardiology.  04/14/2021: OV with Dr. Melvyn Novas.  Improvement in cough.  Continued on Symbicort 1 puff twice daily.  Follow-up 6 months.  10/16/2021: Today -follow-up Patient reports today for asthma follow-up and refill of Symbicort.  He reports that he still experiences a mild, dry cough daily; however it is better after being on Symbicort.  He has rare episodes of wheezing.  He denies any shortness of breath, orthopnea, PND, lower extremity swelling, chest pain.  He would like to try to decrease his Symbicort if possible.  He is very active and runs track.  He does not notice any limitation in his activities for the most part.  He rarely uses his albuterol inhaler.  FeNO 29 ppb  Allergies  Allergen Reactions   Niacin Er Other (See Comments)   Statins Other (See Comments)   Atorvastatin Other (See Comments)    Muscle weakness   Crestor [Rosuvastatin] Other (See Comments)     Muscle weakness   Demerol [Meperidine] Nausea Only   Niacin And Related Itching, Rash and Other (See Comments)    Palms turned red and itched   Repatha [Evolocumab] Other (See Comments)    Muscle weakness    Immunization History  Administered Date(s) Administered   DTaP 02/03/2011   Fluad Quad(high Dose 65+) 05/17/2020   Influenza, High Dose Seasonal PF 07/20/2016, 05/29/2017, 05/29/2018, 05/28/2019, 06/17/2021   Influenza, Quadrivalent, Recombinant, Inj, Pf 05/24/2020   Influenza-Unspecified 06/23/2015   PFIZER(Purple Top)SARS-COV-2 Vaccination 10/08/2019, 10/29/2019, 07/06/2020, 04/12/2021   Pneumococcal Conjugate-13 04/19/2016   Pneumococcal Polysaccharide-23 08/11/2012    Past Medical History:  Diagnosis Date   Allergy    rhinitis   Anemia    "Hgb always on the low side" (07/02/2013)   CAD (coronary artery disease)    a. LHC 2/17: pLAD 50, mLAD 95, pRCA 30, dRCA 25, EF 50-55% >> PCI:  3 x 18 mm Resolute DES to mLAD    Chronic bronchitis ([place])    "not in the last year since I started taking allergy shots" (10/09/2016)   Chronic lower back pain    Chronic sinus bradycardia 07/02/2013   Coronary artery disease 10/15/2016   Cryptogenic stroke ([place]) 07/02/2013   a. "small ischemic occipital right sided" (07/02/2013)  //  b. Event monitor 10/14: sinus brady  //  c. Carotid US 10/14: bilat ICA 1-39%   Esophageal stricture    Exertional angina ([place]) 10/09/2016   Heart murmur    "as a child" (07/02/2013)  History of echocardiogram    a. Echo 10/14: EF 60-65%, no RWMA, normal diastolic function   Hyperlipidemia    Hypothyroidism    Nonallopathic lesion of lumbosacral region 06/04/2017   Nonallopathic lesion of sacral region 06/04/2017   Nonallopathic lesion of thoracic region 06/04/2017   Osteoarthritis    "lower back" (10/09/2016)   PFO (patent foramen ovale)    a. TEE 11/14: mild LVH, EF 60-65%, small PFO, R-L shunt    Tobacco History: Social History   Tobacco Use   Smoking Status Never  Smokeless Tobacco Never   Counseling given: Not Answered   Outpatient Medications Prior to Visit  Medication Sig Dispense Refill   Alirocumab (PRALUENT) 75 MG/ML SOAJ INJECT 1 ML INTO THE SKIN EVERY 14 (FOURTEEN) DAYS. 2 mL 11   ALPRAZolam (XANAX) 0.5 MG tablet Take 1-2 tablets (0.5-1 mg total) by mouth 3 (three) times daily as needed for anxiety (anxiety attack). 30 tablet 0   aspirin EC 81 MG tablet Take 1 tablet (81 mg total) by mouth daily. 90 tablet 3   Cholecalciferol 2000 UNITS CAPS Take 2,000 Units by mouth every evening.     clopidogrel (PLAVIX) 75 MG tablet Take 1 tablet (75 mg total) by mouth daily. 90 tablet 3   Ferrous Sulfate (IRON) 325 (65 Fe) MG TABS Take 1 tablet by mouth. Taking three times a week     levothyroxine (SYNTHROID) 25 MCG tablet Take 25 mcg by mouth daily before breakfast. Per patient taking 2 tablets on Monday-Saturday, do not take on Sunday     nitroGLYCERIN (NITROSTAT) 0.4 MG SL tablet Place 1 tablet (0.4 mg total) under the tongue every 5 (five) minutes as needed for chest pain. 90 tablet 3   Turmeric 500 MG CAPS Take 1 capsule by mouth at bedtime.     budesonide-formoterol (SYMBICORT) 80-4.5 MCG/ACT inhaler Take 2 puffs first thing in am and then another 2 puffs about 12 hours later. 1 each 12   Omega-3 Fatty Acids (FISH OIL) 1000 MG CAPS Take 1 capsule by mouth daily.     sacubitril-valsartan (ENTRESTO) 24-26 MG Take 1 tablet by mouth 2 (two) times daily. (Patient not taking: Reported on 10/16/2021) 60 tablet 0   No facility-administered medications prior to visit.     Review of Systems:   Constitutional: No weight loss or gain, night sweats, fevers, chills, fatigue, or lassitude. HEENT: No headaches, difficulty swallowing, tooth/dental problems, or sore throat. No sneezing, itching, ear ache, nasal congestion, or post nasal drip CV:  No chest pain, orthopnea, PND, swelling in lower extremities, anasarca, dizziness, palpitations,  syncope Resp: +cough, dry; rare wheeze. No shortness of breath with exertion or at rest. No excess mucus or change in color of mucus. No hemoptysis.  No chest wall deformity GI:  No heartburn, indigestion, abdominal pain, nausea, vomiting, diarrhea, change in bowel habits, loss of appetite, bloody stools.  GU: No dysuria, change in color of urine, urgency or frequency.  No flank pain, no hematuria  Skin: No rash, lesions, ulcerations MSK:  No joint pain or swelling.  No decreased range of motion.  No back pain. Neuro: No dizziness or lightheadedness.  Psych: No depression or anxiety. Mood stable.     Physical Exam:  BP 120/70 (BP Location: Right Arm, Cuff Size: Normal)    Pulse (!) 53    Temp 98.4 F (36.9 C) (Oral)    Ht 6\' 1"  (1.854 m)    Wt 193 lb 3.2 oz (87.6 kg)  SpO2 99%    BMI 25.49 kg/m   GEN: Pleasant, interactive, well-nourished; in no acute distress HEENT:  Normocephalic and atraumatic. EACs patent bilaterally. TM pearly gray with present light reflex bilaterally. PERRLA. Sclera white. Nasal turbinates pink, moist and patent bilaterally. No rhinorrhea present. Oropharynx pink and moist, without exudate or edema. No lesions, ulcerations, or postnasal drip.  NECK:  Supple w/ fair ROM. No JVD present. Normal carotid impulses w/o bruits. Thyroid symmetrical with no goiter or nodules palpated. No lymphadenopathy.   CV: RRR, no m/r/g, no peripheral edema. Pulses intact, +2 bilaterally. No cyanosis, pallor or clubbing. PULMONARY:  Unlabored, regular breathing. Clear bilaterally A&P w/o wheezes/rales/rhonchi. No accessory muscle use. No dullness to percussion. GI: BS present and normoactive. Soft, non-tender to palpation. No organomegaly or masses detected. No CVA tenderness. MSK: No erythema, warmth or tenderness. Cap refil <2 sec all extrem. No deformities or joint swelling noted.  Neuro: A/Ox3. No focal deficits noted.   Skin: Warm, no lesions or rashe Psych: Normal affect and  behavior. Judgement and thought content appropriate.     Lab Results:  CBC    Component Value Date/Time   WBC 9.3 08/21/2021 2030   RBC 4.37 08/21/2021 2030   HGB 13.9 08/21/2021 2040   HGB 12.9 (L) 10/08/2016 1010   HCT 41.0 08/21/2021 2040   HCT 39.2 10/08/2016 1010   PLT 215 08/21/2021 2030   PLT 202 10/08/2016 1010   MCV 92.0 08/21/2021 2030   MCV 96 10/08/2016 1010   MCH 32.3 08/21/2021 2030   MCHC 35.1 08/21/2021 2030   RDW 12.0 08/21/2021 2030   RDW 13.7 10/08/2016 1010   LYMPHSABS 2.0 08/21/2021 2030   MONOABS 0.6 08/21/2021 2030   EOSABS 0.2 08/21/2021 2030   BASOSABS 0.0 08/21/2021 2030    BMET    Component Value Date/Time   NA 138 08/21/2021 2040   NA 139 10/08/2016 1010   K 3.9 08/21/2021 2040   CL 103 08/21/2021 2040   CO2 26 08/21/2021 2030   GLUCOSE 125 (H) 08/21/2021 2040   GLUCOSE 105 (H) 07/30/2006 0741   BUN 19 08/21/2021 2040   BUN 18 10/08/2016 1010   CREATININE 1.10 08/21/2021 2040   CREATININE 1.10 11/07/2015 1212   CALCIUM 9.6 08/21/2021 2030   GFRNONAA >60 08/21/2021 2030   GFRAA >60 10/19/2016 0345    BNP No results found for: BNP   Imaging:  MR ANGIO HEAD WO CONTRAST  Result Date: 09/19/2021 CLINICAL DATA:  Transient ischemic attack. EXAM: MRA NECK WITHOUT AND WITH CONTRAST MRA HEAD WITHOUT CONTRAST TECHNIQUE: Multiplanar and multiecho pulse sequences of the neck were obtained without and with intravenous contrast. Angiographic images of the neck were obtained using MRA technique without and with intravenous contrast; Angiographic images of the Circle of Willis were obtained using MRA technique without intravenous contrast. CONTRAST:  4mL MULTIHANCE GADOBENATE DIMEGLUMINE 529 MG/ML IV SOLN COMPARISON:  MRI of the brain August 21, 2021. FINDINGS: MRA NECK FINDINGS Aortic arch: Normal variant aortic arch branching pattern with the brachiocephalic and left common carotid arteries sharing a common origin. The visualized subclavian  arteries are normal. Right carotid system: Normal course and caliber without stenosis or evidence of dissection. Left carotid system: Normal course and caliber without stenosis or evidence of dissection. Vertebral arteries: Codominant. Vertebral artery origins are normal. Vertebral arteries are normal in course and caliber to the vertebrobasilar confluence without stenosis or evidence of dissection. MRA HEAD FINDINGS The visualized portions of the distal cervical and  intracranial internal carotid arteries are widely patent with normal flow related enhancement. The bilateral anterior cerebral arteries are patent with antegrade flow without high-grade flow-limiting stenosis or proximal branch occlusion. Attenuation of the flow related enhancement at the bilateral M1/MCA bifurcation and proximal M2 branches is artifactual. No intracranial aneurysm within the anterior circulation. Attenuation of the flow related enhancement in the bilateral V3-V4 junction is artifactual. Vertebral arteries are otherwise unremarkable. Vertebrobasilar junction and basilar artery are widely patent with antegrade flow without evidence of basilar stenosis or aneurysm. Posterior cerebral arteries are normal bilaterally. No intracranial aneurysm within the posterior circulation. IMPRESSION: No significant vascular abnormality of the of the Ellis arteries of the head and neck. Electronically Signed   By: Pedro Earls M.D.   On: 09/19/2021 11:14   MR ANGIO NECK W WO CONTRAST  Result Date: 09/19/2021 CLINICAL DATA:  Transient ischemic attack. EXAM: MRA NECK WITHOUT AND WITH CONTRAST MRA HEAD WITHOUT CONTRAST TECHNIQUE: Multiplanar and multiecho pulse sequences of the neck were obtained without and with intravenous contrast. Angiographic images of the neck were obtained using MRA technique without and with intravenous contrast; Angiographic images of the Circle of Willis were obtained using MRA technique without intravenous  contrast. CONTRAST:  9mL MULTIHANCE GADOBENATE DIMEGLUMINE 529 MG/ML IV SOLN COMPARISON:  MRI of the brain August 21, 2021. FINDINGS: MRA NECK FINDINGS Aortic arch: Normal variant aortic arch branching pattern with the brachiocephalic and left common carotid arteries sharing a common origin. The visualized subclavian arteries are normal. Right carotid system: Normal course and caliber without stenosis or evidence of dissection. Left carotid system: Normal course and caliber without stenosis or evidence of dissection. Vertebral arteries: Codominant. Vertebral artery origins are normal. Vertebral arteries are normal in course and caliber to the vertebrobasilar confluence without stenosis or evidence of dissection. MRA HEAD FINDINGS The visualized portions of the distal cervical and intracranial internal carotid arteries are widely patent with normal flow related enhancement. The bilateral anterior cerebral arteries are patent with antegrade flow without high-grade flow-limiting stenosis or proximal branch occlusion. Attenuation of the flow related enhancement at the bilateral M1/MCA bifurcation and proximal M2 branches is artifactual. No intracranial aneurysm within the anterior circulation. Attenuation of the flow related enhancement in the bilateral V3-V4 junction is artifactual. Vertebral arteries are otherwise unremarkable. Vertebrobasilar junction and basilar artery are widely patent with antegrade flow without evidence of basilar stenosis or aneurysm. Posterior cerebral arteries are normal bilaterally. No intracranial aneurysm within the posterior circulation. IMPRESSION: No significant vascular abnormality of the of the Ellis arteries of the head and neck. Electronically Signed   By: Pedro Earls M.D.   On: 09/19/2021 11:14   CARDIAC EVENT MONITOR  Result Date: 10/08/2021 1. The basic rhythm is normal sinus with an average HR of 56 bpm 2. No atrial fibrillation or flutter 3. No high-grade  heart block or pathologic pauses 4. There are rare PVC's (<1% burden) and rare supraventricular beats (<1% burden) without sustained arrhythmias   EEG adult        [facility] [address] [place] [address] [phone]      Electroencephalogram Procedure Note Mr. GREGGORY SAFRANEK Date of Birth:  Aug 30, 1946 Medical Record Number:  [id number] Indications: Diagnostic Date of Procedure 09/05/2021 Medications: none Clinical history : 76 year old patient being evaluated for partial seizures Technical Description This study was performed using 17 channel digital electroencephalographic recording equipment. International 10-20 electrode placement was used. The record was obtained with the patient  awake, drowsy and asleep.  The record is of good technical quality for purposes of interpretation. Activation Procedures:  hyperventilation and photic stimulation . EEG Description Awake: Alpha Activity: The waking state record contains a well-defined bi-occipital alpha rhythm of  moderate amplitude with a dominant frequency of 9 Hz. Reactivity is present. No paroxsymal activity, spikes, or sharp waves are noted. Technical component of study is adequate. EKG tracing shows regular sinus rhythm Length of this recording is 23 minutes and 7 seconds Sleep: With drowsiness, there is attenuation of the background alpha activity. As the patient enters into light sleep, vertex waves and symmetrical spindles are noted. K complexes are noted in sleep. Transition to the waking state is unremarkable. Result of Activation Procedures: Hyperventilation: Hyperventilation for three minutes fails to activate the recording. Photo Stimulation: No photic driving response is noted. Summary Normal electroencephalogram, awake, asleep and with activation procedures. There are no focal lateralizing or epileptiform features.   MYOCARDIAL PERFUSION IMAGING  Result Date: 09/27/2021   The study is normal. The study is intermediate  risk.   No ST deviation was noted.   LV perfusion is normal. There is no evidence of ischemia. There is no evidence of infarction.   Left ventricular function is normal. Nuclear stress EF: 38 %. The left ventricular ejection fraction is moderately decreased (30-44%). End diastolic cavity size is normal.   Prior study not available for comparison. Reduced apical counts with normal wall motion consistent with apical thinning artifact. Normal study without ischemia or infarction. Moderately reduced LVEF=38%. Global hypokinesis with postoperative septal movement. Visually, appears closer to 50% which was seen on echo recently. Excellent exercise capacity (11:02 min:s; 13.4 METS). Normal HR/BP response to exercise. This is an intermediate risk study based on EF.    technetium tetrofosmin (TC-MYOVIEW) injection 16.1 millicurie     Date Action Dose Route User   09/27/2021 1011 Contrast Given 09.6 millicurie Intravenous Hornowski Kubak, Elzbieta      technetium tetrofosmin (TC-MYOVIEW) injection 04.5 millicurie     Date Action Dose Route User   09/27/2021 1141 Contrast Given 40.9 millicurie Intravenous Hornowski Meta Hatchet, Luz Brazen       PFT Results Latest Ref Rng & Units 10/11/2016  FVC-Pre L 5.23  FVC-Predicted Pre % 106  FVC-Post L 5.19  FVC-Predicted Post % 105  Pre FEV1/FVC % % 86  Post FEV1/FCV % % 89  FEV1-Pre L 4.52  FEV1-Predicted Pre % 124  FEV1-Post L 4.63  DLCO uncorrected ml/min/mmHg 26.36  DLCO UNC% % 72  DLCO corrected ml/min/mmHg 27.69  DLCO COR %Predicted % 76  DLVA Predicted % 82  TLC L 7.90  TLC % Predicted % 103  RV % Predicted % 103    No results found for: NITRICOXIDE      Assessment & Plan:   Asthma Cough improved on Symbicort. FeNO slightly elevated today at 29 ppb. Prev eos 200. Will start Singulair At bedtime as pt would like to come off of Symbicort if possible. Discussed that he will likely need it PRN, if we are able to get him off daily dosing. Continue on  Symbicort 1 puff Twice daily. Re-evalute for step down at next visit.   Patient Instructions  -Continue Symbicort 80 1 puff Twice daily. Brush tongue and rinse mouth afterwards.  -Continue Albuterol inhaler 2 puffs every 6 hours as needed for shortness of breath or wheezing. Notify if symptoms persist despite rescue inhaler use. Can use 15 min prior to exercise  -Start Singulair 10 mg At  bedtime  -Can use Zyrtec 10 mg daily or other second generation antihistamine over the counter during allergy season if allergy symptoms develop or breathing/cough worsen  Asthma Action Plan in place Rinse mouth after inhaled corticosteroid use.  Avoid triggers, when able.  Exercise encouraged. Notify if worsening symptoms upon exertion.  Notify and seek help if symptoms unrelieved by rescue inhaler.  Follow up in 3 months with Dr. Melvyn Novas or Alanson Aly. If symptoms do not improve or worsen, please contact office for sooner follow up or seek emergency care.   Chronic cough Suspected to be r/t small airways dx and likely asthma. Improved on Symbicort. Will continue today and add Singulair as FeNO slightly elevated and Eos previously 200. Re-evaluate for step down at next visit.   Clayton Bibles, NP 10/16/2021  Pt aware and understands NP's role.

## 2021-10-16 NOTE — Progress Notes (Signed)
Patient ID: Richard Ellis                 DOB: [birth year]                      MRN: [id number]     HPI: Richard Ellis is a 76 y.o. male referred by Dr. Burt Knack to pharmacy clinic for HF medication management. PMH is significant for CAD, CVA (2014), HLD, and a patent foramen ovale, resting bradycardia, angina. Most recent LVEF on 08/30/21 was 45-50%, with no evidence of blood flow limitation. Cath in 11/03/20 showed severe mid LAD stenosis and DES was placed to mid LAD. Required CABG in 2018 and was treated with a LIMA to LAD graft and a saphenous vein graft to the first diagonal. Of note, admitted for TIA on 08/21/21. CT scan of the brain and an MRI of the brain were essentially unremarkable with no acute infarct demonstrated. Due to mild/mod LV dysfunction on ECHO and nuclear studies on 08/30/21, Dr. Burt Knack recommended that pt be started on a trial of Entresto 24/26mg  and titrated as tolerated. Referred to PharmD for f/u.   Today, pt returns to pharmacy clinic for [facility] f/u. Patient states he has not started taking the medication as the co-pay was over $200. He was not aware there was a co-pay card. Denies dizziness, lightheadedness, and fatigue, chest pain, palpitations, SOB, or LEE. Able to complete all ADLs. He does not regularly check his weight at home (has gained ~10lbs over the past year). He adheres to a low-salt diet and occasionally takes ibuprofen over the counter. Does not check BP at home.   Current CHF meds: Entresto 24/26mg  BID (has not started) Previously tried: n/a BP goal: <130/21mmHg  Family History: COPD in his mother; Coronary artery disease in an other family member; Heart disease in his father and paternal uncle.  Social History: never smoker, 8 alcoholic drinks/week  Diet: overall healthy - eats oatmeal, starting adding protein powder to meals, PB&J sandwich, no added salt, has 2 cups of coffee per day, sometimes decaf  Exercise: exercises 7 days per week for 30 mins  Home  BP readings: (in clinic on 10/16/21 - 120/70, HR 53)  Labs: (08/21/2021) Creatinine, Ser 1.10; Potassium 3.9; Sodium 138   Wt Readings from Last 3 Encounters:  10/16/21 193 lb 3.2 oz (87.6 kg)  09/27/21 192 lb (87.1 kg)  08/31/21 191 lb (86.6 kg)   BP Readings from Last 3 Encounters:  10/16/21 120/70  08/31/21 122/82  08/29/21 (!) 150/85   Pulse Readings from Last 3 Encounters:  10/16/21 (!) 53  08/31/21 65  08/29/21 (!) 46    Renal function: CrCl cannot be calculated (Patient's most recent lab result is older than the maximum 21 days allowed.).  Past Medical History:  Diagnosis Date   Allergy    rhinitis   Anemia    "Hgb always on the low side" (07/02/2013)   CAD (coronary artery disease)    a. LHC 2/17: pLAD 50, mLAD 95, pRCA 30, dRCA 25, EF 50-55% >> PCI:  3 x 18 mm Resolute DES to mLAD    Chronic bronchitis ([place])    "not in the last year since I started taking allergy shots" (10/09/2016)   Chronic lower back pain    Chronic sinus bradycardia 07/02/2013   Coronary artery disease 10/15/2016   Cryptogenic stroke ([place]) 07/02/2013   a. "small ischemic occipital right sided" (07/02/2013)  //  b. Event monitor 10/14:  sinus brady  //  c. Carotid US 10/14: bilat ICA 1-39%   Esophageal stricture    Exertional angina ([place]) 10/09/2016   Heart murmur    "as a child" (07/02/2013)   History of echocardiogram    a. Echo 10/14: EF 60-65%, no RWMA, normal diastolic function   Hyperlipidemia    Hypothyroidism    Nonallopathic lesion of lumbosacral region 06/04/2017   Nonallopathic lesion of sacral region 06/04/2017   Nonallopathic lesion of thoracic region 06/04/2017   Osteoarthritis    "lower back" (10/09/2016)   PFO (patent foramen ovale)    a. TEE 11/14: mild LVH, EF 60-65%, small PFO, R-L shunt    Current Outpatient Medications on File Prior to Visit  Medication Sig Dispense Refill   Alirocumab (PRALUENT) 75 MG/ML SOAJ INJECT 1 ML INTO THE SKIN EVERY 14 (FOURTEEN) DAYS. 2 mL 11    ALPRAZolam (XANAX) 0.5 MG tablet Take 1-2 tablets (0.5-1 mg total) by mouth 3 (three) times daily as needed for anxiety (anxiety attack). 30 tablet 0   aspirin EC 81 MG tablet Take 1 tablet (81 mg total) by mouth daily. 90 tablet 3   budesonide-formoterol (SYMBICORT) 80-4.5 MCG/ACT inhaler Inhale 2 puffs into the lungs daily. 1 each 6   Cholecalciferol 2000 UNITS CAPS Take 2,000 Units by mouth every evening.     clopidogrel (PLAVIX) 75 MG tablet Take 1 tablet (75 mg total) by mouth daily. 90 tablet 3   Ferrous Sulfate (IRON) 325 (65 Fe) MG TABS Take 1 tablet by mouth. Taking three times a week     levothyroxine (SYNTHROID) 25 MCG tablet Take 25 mcg by mouth daily before breakfast. Per patient taking 2 tablets on Monday-Saturday, do not take on Sunday     montelukast (SINGULAIR) 10 MG tablet Take 1 tablet (10 mg total) by mouth at bedtime. 30 tablet 6   nitroGLYCERIN (NITROSTAT) 0.4 MG SL tablet Place 1 tablet (0.4 mg total) under the tongue every 5 (five) minutes as needed for chest pain. 90 tablet 3   Omega-3 Fatty Acids (FISH OIL) 1000 MG CAPS Take 1 capsule by mouth daily.     sacubitril-valsartan (ENTRESTO) 24-26 MG Take 1 tablet by mouth 2 (two) times daily. (Patient not taking: Reported on 10/16/2021) 60 tablet 0   Turmeric 500 MG CAPS Take 1 capsule by mouth at bedtime.     No current facility-administered medications on file prior to visit.    Allergies  Allergen Reactions   Niacin Er Other (See Comments)   Statins Other (See Comments)   Atorvastatin Other (See Comments)    Muscle weakness   Crestor [Rosuvastatin] Other (See Comments)    Muscle weakness   Demerol [Meperidine] Nausea Only   Niacin And Related Itching, Rash and Other (See Comments)    Palms turned red and itched   Repatha [Evolocumab] Other (See Comments)    Muscle weakness     Assessment/Plan:  1. CHF -  BP is at goal of <130/80, but pt is not on any GDMT for HFrEF. As patient did not start Entresto 24/26mg   BID due to high cost, obtained a copay card for the patient to use for first fill. Counseled patient on how to obtain first free fill and $10/month thereafter. F/u visit scheduled in 2 weeks to obtain BMET and titrate Entresto as able. Consider addition of spironolactone of SGLT2 inhibitor at future visits.   Jethro Poling, PharmD Student assisted in this  visit.  Karren Cobble, PharmD, BCACP, [place], CPP  [facility] [address] [place], [place] [address] Phone: [phone]; Fax: [phone] 10/17/2021 10:22 AM

## 2021-10-16 NOTE — Progress Notes (Signed)
feno

## 2021-10-17 ENCOUNTER — Ambulatory Visit (INDEPENDENT_AMBULATORY_CARE_PROVIDER_SITE_OTHER): Payer: Medicare Other | Admitting: Pharmacist

## 2021-10-17 VITALS — BP 121/69 | HR 51 | Wt 191.2 lb

## 2021-10-17 DIAGNOSIS — I509 Heart failure, unspecified: Secondary | ICD-10-CM

## 2021-10-17 DIAGNOSIS — I25810 Atherosclerosis of coronary artery bypass graft(s) without angina pectoris: Secondary | ICD-10-CM

## 2021-11-02 ENCOUNTER — Ambulatory Visit (INDEPENDENT_AMBULATORY_CARE_PROVIDER_SITE_OTHER): Payer: Medicare Other | Admitting: Sports Medicine

## 2021-11-02 VITALS — BP 103/67 | Ht 73.0 in | Wt 184.0 lb

## 2021-11-02 DIAGNOSIS — M79671 Pain in right foot: Secondary | ICD-10-CM

## 2021-11-02 DIAGNOSIS — I25810 Atherosclerosis of coronary artery bypass graft(s) without angina pectoris: Secondary | ICD-10-CM | POA: Diagnosis not present

## 2021-11-02 NOTE — Assessment & Plan Note (Signed)
With patient's findings on examination, would recommend padding between his first and second toes on the right.  Trialed with foam toe separator, padded toe sleeve, also advised to consider corn pad.  Given samples and catalog to order more.  Follow-up as needed.

## 2021-11-02 NOTE — Progress Notes (Signed)
° °  Richard Ellis is a 76 y.o. male who presents to [facility] today for the following:  Right Second Toe Pain Avid runner Training for races in July Will be running an 811m, 1566m, and 5k Currently runs about 10 miles/wk Feels like 1st and 2nd toes rub together and hurt Has been using a gel spacer that helps some, but it starts to hurt after using it for quite some time Presents today to see if there is anything he can do to help with his pain while he continues to run  [place] reviewed.  ROS as above. Medications reviewed.  Exam:  BP 103/67    Ht 6\' 1"  (1.854 m)    Wt 184 lb (83.5 kg)    BMI 24.28 kg/m  Gen: Well NAD MSK:  Right Foot: Inspection:  Previous Morton's foot R>L, right 2nd toe with corn and arthritis at DIP, PIP subluxation with lateral deviation.  On Left, there is 2nd PIP knuckle pad with mild corn at DIP Palpation: No tenderness to palpation b/l ROM: Full  ROM of the ankle b/l. Normal midfoot flexibility b/l Neurovascular: N/V intact distally in the lower extremity b/l Special tests: Normal calcaneal motion with heel raise   Assessment and Plan: 1) Right foot pain With patient's findings on examination, would recommend padding between his first and second toes on the right.  Trialed with foam toe separator, padded toe sleeve, also advised to consider corn pad.  Given samples and catalog to order more.  Follow-up as needed.   [place] Constable, D.O.  PGY-4 [facility]  11/02/2021 3:06 PM  I observed and examined the patient with the [facility] resident and agree with assessment and plan.  Note reviewed and modified by me. Ila Mcgill, MD

## 2021-11-07 ENCOUNTER — Ambulatory Visit: Payer: Medicare Other

## 2021-11-07 NOTE — Progress Notes (Unsigned)
Patient ID: Richard Ellis                 DOB: 1945-12-10                      MRN: [id number]     HPI: Richard Ellis is a 76 y.o. male referred by Dr. Burt Knack to pharmacy clinic for HF medication management. PMH is significant for CAD s/p CABG in 2018, CVA in 2014, PFO, HLD, resting bradycardia, and angina. Cath in 11/03/20 showed severe mid LAD stenosis and DES was placed to mid LAD. Of note, admitted for TIA on 08/21/21. CT scan of the brain and an MRI of the brain were essentially unremarkable with no acute infarct demonstrated. Echo 08/30/21 was 45-50%, with no evidence of blood flow limitation. Due to mild/mod LV dysfunction, Dr. Burt Knack started pt on Entresto 24/26mg  BID and referred pt to PharmD for f/u.   Pt was seen 10/17/21 by PharmD. Had not started [facility] because the copay was $200. He has Pharmacist, community but had not been set up with a copay card. This was done and pt presents today for follow up.  Denies dizziness, lightheadedness, and fatigue, chest pain, palpitations, SOB, or LEE. Able to complete all ADLs. He does not regularly check his weight at home (has gained ~10lbs over the past year). He adheres to a low-salt diet and occasionally takes ibuprofen over the counter. Does not check BP at home.   weight Tolerating entresto well? Needs bmet New entresto rx sent in for next dose up Start sglt2i next - farxiga 10 and copay card diet  Current CHF meds: Entresto 24-26mg  BID BP goal: <130/51mmHg  Family History: COPD in his mother; Coronary artery disease in an other family member; Heart disease in his father and paternal uncle.  Social History: Denies tobacco use, 8 alcoholic drinks/week  Diet:   Exercise: exercises 7 days per week for 30 mins  Home BP readings:   Wt Readings from Last 3 Encounters:  11/02/21 184 lb (83.5 kg)  10/17/21 191 lb 3.2 oz (86.7 kg)  10/16/21 193 lb 3.2 oz (87.6 kg)   BP Readings from Last 3 Encounters:  11/02/21 103/67  10/17/21 121/69   10/16/21 120/70   Pulse Readings from Last 3 Encounters:  10/17/21 (!) 51  10/16/21 (!) 53  08/31/21 65    Renal function: CrCl cannot be calculated (Patient's most recent lab result is older than the maximum 21 days allowed.).  Past Medical History:  Diagnosis Date   Allergy    rhinitis   Anemia    "Hgb always on the low side" (07/02/2013)   CAD (coronary artery disease)    a. LHC 2/17: pLAD 50, mLAD 95, pRCA 30, dRCA 25, EF 50-55% >> PCI:  3 x 18 mm Resolute DES to mLAD    Chronic bronchitis ([place])    "not in the last year since I started taking allergy shots" (10/09/2016)   Chronic lower back pain    Chronic sinus bradycardia 07/02/2013   Coronary artery disease 10/15/2016   Cryptogenic stroke ([place]) 07/02/2013   a. "small ischemic occipital right sided" (07/02/2013)  //  b. Event monitor 10/14: sinus brady  //  c. Carotid US 10/14: bilat ICA 1-39%   Esophageal stricture    Exertional angina ([place]) 10/09/2016   Heart murmur    "as a child" (07/02/2013)   History of echocardiogram    a. Echo 10/14: EF 60-65%, no RWMA, normal  diastolic function   Hyperlipidemia    Hypothyroidism    Nonallopathic lesion of lumbosacral region 06/04/2017   Nonallopathic lesion of sacral region 06/04/2017   Nonallopathic lesion of thoracic region 06/04/2017   Osteoarthritis    "lower back" (10/09/2016)   PFO (patent foramen ovale)    a. TEE 11/14: mild LVH, EF 60-65%, small PFO, R-L shunt    Current Outpatient Medications on File Prior to Visit  Medication Sig Dispense Refill   Alirocumab (PRALUENT) 75 MG/ML SOAJ INJECT 1 ML INTO THE SKIN EVERY 14 (FOURTEEN) DAYS. 2 mL 11   ALPRAZolam (XANAX) 0.5 MG tablet Take 1-2 tablets (0.5-1 mg total) by mouth 3 (three) times daily as needed for anxiety (anxiety attack). 30 tablet 0   aspirin EC 81 MG tablet Take 1 tablet (81 mg total) by mouth daily. 90 tablet 3   budesonide-formoterol (SYMBICORT) 80-4.5 MCG/ACT inhaler Inhale 2 puffs into the lungs daily.  1 each 6   Cholecalciferol 2000 UNITS CAPS Take 2,000 Units by mouth every evening.     clopidogrel (PLAVIX) 75 MG tablet Take 1 tablet (75 mg total) by mouth daily. 90 tablet 3   Ferrous Sulfate (IRON) 325 (65 Fe) MG TABS Take 1 tablet by mouth. Taking three times a week     levothyroxine (SYNTHROID) 25 MCG tablet Take 25 mcg by mouth daily before breakfast. Per patient taking 2 tablets on Monday-Saturday, do not take on Sunday     montelukast (SINGULAIR) 10 MG tablet Take 1 tablet (10 mg total) by mouth at bedtime. 30 tablet 6   nitroGLYCERIN (NITROSTAT) 0.4 MG SL tablet Place 1 tablet (0.4 mg total) under the tongue every 5 (five) minutes as needed for chest pain. 90 tablet 3   Turmeric 500 MG CAPS Take 1 capsule by mouth at bedtime.     No current facility-administered medications on file prior to visit.    Allergies  Allergen Reactions   Niacin Er Other (See Comments)   Statins Other (See Comments)   Atorvastatin Other (See Comments)    Muscle weakness   Crestor [Rosuvastatin] Other (See Comments)    Muscle weakness   Demerol [Meperidine] Nausea Only   Niacin And Related Itching, Rash and Other (See Comments)    Palms turned red and itched   Repatha [Evolocumab] Other (See Comments)    Muscle weakness     Assessment/Plan:  1. CHF -

## 2021-11-09 ENCOUNTER — Ambulatory Visit (INDEPENDENT_AMBULATORY_CARE_PROVIDER_SITE_OTHER): Payer: Medicare Other | Admitting: Pharmacist

## 2021-11-09 ENCOUNTER — Other Ambulatory Visit: Payer: Self-pay

## 2021-11-09 VITALS — BP 116/72 | HR 64 | Wt 184.0 lb

## 2021-11-09 DIAGNOSIS — I509 Heart failure, unspecified: Secondary | ICD-10-CM | POA: Diagnosis not present

## 2021-11-09 DIAGNOSIS — I25810 Atherosclerosis of coronary artery bypass graft(s) without angina pectoris: Secondary | ICD-10-CM

## 2021-11-09 MED ORDER — ENTRESTO 24-26 MG PO TABS: 1.0000 | ORAL_TABLET | Freq: Two times a day (BID) | ORAL | 5 refills | Status: DC

## 2021-11-09 MED ORDER — DAPAGLIFLOZIN PROPANEDIOL 10 MG PO TABS: 10.0000 mg | ORAL_TABLET | Freq: Every day | ORAL | 5 refills | Status: DC

## 2021-11-09 NOTE — Patient Instructions (Addendum)
Start taking Farxiga 10mg  - 1 tablet once daily  Continue taking your other medications  Keep an eye on your blood pressure - your goal is < 130/63mHg  You can bring in a log of your home readings and your blood pressure cuff to your next visit in ~5 weeks bn

## 2021-11-09 NOTE — Progress Notes (Signed)
Patient ID: Richard Ellis                 DOB: 1945-11-15                      MRN: [id number]     HPI: BALDO Ellis is a 76 y.o. male referred by Dr. Burt Knack to pharmacy clinic for HF medication management. PMH is significant for CAD s/p CABG in 2018, CVA in 2014, PFO, HLD, bradycardia, and angina. Cath in 11/03/20 showed severe mid LAD stenosis and DES was placed to mid LAD. Pt was admitted with TIA on 08/21/21. CT scan of the brain and an MRI of the brain were essentially unremarkable with no acute infarct demonstrated. Echo 08/30/21 was 45-50%, with no evidence of blood flow limitation. Due to mild/mod LV dysfunction, pt was started on Entresto 24/26mg  BID and referred to PharmD for f/u.   Pt was seen 10/17/21 by PharmD. Had not started [facility] because the copay was $200. He has Pharmacist, community but had not been set up with a copay card. This was done and pt presents today for follow up.  Reports tolerating Entresto well. Denies dizziness, LE edema, and SOB. Able to complete all ADLs. He adheres to a low-salt diet. Has not been checking BP at home but does have a cuff. Had an MD appt last week and BP was a bit low at 103/67.   Current CHF meds: Entresto 24-26mg  BID BP goal: <130/76mmHg  Family History: COPD in his mother; Coronary artery disease in an other family member; Heart disease in his father and paternal uncle.  Social History: Denies tobacco use, 8 alcoholic drinks/week  Exercise: exercises 7 days per week for 30 mins  Wt Readings from Last 3 Encounters:  11/02/21 184 lb (83.5 kg)  10/17/21 191 lb 3.2 oz (86.7 kg)  10/16/21 193 lb 3.2 oz (87.6 kg)   BP Readings from Last 3 Encounters:  11/02/21 103/67  10/17/21 121/69  10/16/21 120/70   Pulse Readings from Last 3 Encounters:  10/17/21 (!) 51  10/16/21 (!) 53  08/31/21 65    Renal function: CrCl cannot be calculated (Patient's most recent lab result is older than the maximum 21 days allowed.).  Past Medical History:   Diagnosis Date   Allergy    rhinitis   Anemia    "Hgb always on the low side" (07/02/2013)   CAD (coronary artery disease)    a. LHC 2/17: pLAD 50, mLAD 95, pRCA 30, dRCA 25, EF 50-55% >> PCI:  3 x 18 mm Resolute DES to mLAD    Chronic bronchitis ([place])    "not in the last year since I started taking allergy shots" (10/09/2016)   Chronic lower back pain    Chronic sinus bradycardia 07/02/2013   Coronary artery disease 10/15/2016   Cryptogenic stroke ([place]) 07/02/2013   a. "small ischemic occipital right sided" (07/02/2013)  //  b. Event monitor 10/14: sinus brady  //  c. Carotid US 10/14: bilat ICA 1-39%   Esophageal stricture    Exertional angina ([place]) 10/09/2016   Heart murmur    "as a child" (07/02/2013)   History of echocardiogram    a. Echo 10/14: EF 60-65%, no RWMA, normal diastolic function   Hyperlipidemia    Hypothyroidism    Nonallopathic lesion of lumbosacral region 06/04/2017   Nonallopathic lesion of sacral region 06/04/2017   Nonallopathic lesion of thoracic region 06/04/2017   Osteoarthritis    "lower  back" (10/09/2016)   PFO (patent foramen ovale)    a. TEE 11/14: mild LVH, EF 60-65%, small PFO, R-L shunt    Current Outpatient Medications on File Prior to Visit  Medication Sig Dispense Refill   Alirocumab (PRALUENT) 75 MG/ML SOAJ INJECT 1 ML INTO THE SKIN EVERY 14 (FOURTEEN) DAYS. 2 mL 11   ALPRAZolam (XANAX) 0.5 MG tablet Take 1-2 tablets (0.5-1 mg total) by mouth 3 (three) times daily as needed for anxiety (anxiety attack). 30 tablet 0   aspirin EC 81 MG tablet Take 1 tablet (81 mg total) by mouth daily. 90 tablet 3   budesonide-formoterol (SYMBICORT) 80-4.5 MCG/ACT inhaler Inhale 2 puffs into the lungs daily. 1 each 6   Cholecalciferol 2000 UNITS CAPS Take 2,000 Units by mouth every evening.     clopidogrel (PLAVIX) 75 MG tablet Take 1 tablet (75 mg total) by mouth daily. 90 tablet 3   Ferrous Sulfate (IRON) 325 (65 Fe) MG TABS Take 1 tablet by mouth. Taking three  times a week     levothyroxine (SYNTHROID) 25 MCG tablet Take 25 mcg by mouth daily before breakfast. Per patient taking 2 tablets on Monday-Saturday, do not take on Sunday     montelukast (SINGULAIR) 10 MG tablet Take 1 tablet (10 mg total) by mouth at bedtime. 30 tablet 6   nitroGLYCERIN (NITROSTAT) 0.4 MG SL tablet Place 1 tablet (0.4 mg total) under the tongue every 5 (five) minutes as needed for chest pain. 90 tablet 3   Turmeric 500 MG CAPS Take 1 capsule by mouth at bedtime.     No current facility-administered medications on file prior to visit.    Allergies  Allergen Reactions   Niacin Er Other (See Comments)   Statins Other (See Comments)   Atorvastatin Other (See Comments)    Muscle weakness   Crestor [Rosuvastatin] Other (See Comments)    Muscle weakness   Demerol [Meperidine] Nausea Only   Niacin And Related Itching, Rash and Other (See Comments)    Palms turned red and itched   Repatha [Evolocumab] Other (See Comments)    Muscle weakness     Assessment/Plan:  1. CHF with EF 45-50% - BP well controlled and at goal <130/73mmHg. Will check BMET today with recent Entresto start. Will continue Entresto 24-26mg  BID and add Farxiga 10mg  daily. Pt will start monitoring BP at home and bring in home log and cuff to next visit with me in 5 weeks.  Purcell Jungbluth E. Hani Patnode, PharmD, BCACP, [facility] [address], [place], [place] [address] Phone: [phone]; Fax: [phone] 11/09/2021 1:56 PM

## 2021-11-10 ENCOUNTER — Other Ambulatory Visit: Payer: Self-pay | Admitting: Internal Medicine

## 2021-11-10 LAB — BASIC METABOLIC PANEL
BUN/Creatinine Ratio: 15 (ref 10–24)
BUN: 17 mg/dL (ref 8–27)
CO2: 20 mmol/L (ref 20–29)
Calcium: 9.3 mg/dL (ref 8.6–10.2)
Chloride: 100 mmol/L (ref 96–106)
Creatinine, Ser: 1.14 mg/dL (ref 0.76–1.27)
Glucose: 133 mg/dL — ABNORMAL HIGH (ref 70–99)
Potassium: 4.2 mmol/L (ref 3.5–5.2)
Sodium: 139 mmol/L (ref 134–144)
eGFR: 67 mL/min/{1.73_m2} (ref >59)

## 2021-11-14 ENCOUNTER — Other Ambulatory Visit: Payer: Self-pay

## 2021-11-14 ENCOUNTER — Encounter: Payer: Self-pay | Admitting: Internal Medicine

## 2021-11-14 ENCOUNTER — Ambulatory Visit (INDEPENDENT_AMBULATORY_CARE_PROVIDER_SITE_OTHER): Payer: Medicare Other | Admitting: Internal Medicine

## 2021-11-14 VITALS — BP 108/66 | HR 50 | Temp 97.7°F | Ht 73.0 in | Wt 185.6 lb

## 2021-11-14 DIAGNOSIS — J4520 Mild intermittent asthma, uncomplicated: Secondary | ICD-10-CM

## 2021-11-14 DIAGNOSIS — R059 Cough, unspecified: Secondary | ICD-10-CM

## 2021-11-14 DIAGNOSIS — I25810 Atherosclerosis of coronary artery bypass graft(s) without angina pectoris: Secondary | ICD-10-CM | POA: Diagnosis not present

## 2021-11-14 DIAGNOSIS — G459 Transient cerebral ischemic attack, unspecified: Secondary | ICD-10-CM | POA: Diagnosis not present

## 2021-11-14 MED ORDER — PREDNISONE 10 MG PO TABS: ORAL_TABLET | ORAL | 1 refills | Status: DC

## 2021-11-14 MED ORDER — AZITHROMYCIN 250 MG PO TABS: ORAL_TABLET | ORAL | 0 refills | Status: DC

## 2021-11-14 MED ORDER — HYDROCODONE BIT-HOMATROP MBR 5-1.5 MG/5ML PO SOLN: 5.0000 mL | Freq: Four times a day (QID) | ORAL | 0 refills | Status: DC | PRN

## 2021-11-14 NOTE — Assessment & Plan Note (Signed)
No relapse 

## 2021-11-14 NOTE — Progress Notes (Signed)
Subjective:  Patient ID: Richard Ellis, male    DOB: 1946-06-14  Age: 76 y.o. MRN: [id number]  CC: Cough (x2 weeks, usually a dry cough //COVID negative), Nasal Congestion (X2 weeks), and Fatigue (X2 weeks)   HPI [name] presents for cough x 2 wks after return from [place] C/o fatigue x 2 weeks, sleepy, can't sleep  Outpatient Medications Prior to Visit  Medication Sig Dispense Refill   Alirocumab (PRALUENT) 75 MG/ML SOAJ INJECT 1 ML INTO THE SKIN EVERY 14 (FOURTEEN) DAYS. 2 mL 11   ALPRAZolam (XANAX) 0.5 MG tablet Take 1-2 tablets (0.5-1 mg total) by mouth 3 (three) times daily as needed for anxiety (anxiety attack). 30 tablet 0   aspirin EC 81 MG tablet Take 1 tablet (81 mg total) by mouth daily. 90 tablet 3   budesonide-formoterol (SYMBICORT) 80-4.5 MCG/ACT inhaler Inhale 2 puffs into the lungs daily. 1 each 6   Cholecalciferol 2000 UNITS CAPS Take 2,000 Units by mouth every evening.     clopidogrel (PLAVIX) 75 MG tablet Take 1 tablet (75 mg total) by mouth daily. 90 tablet 3   dapagliflozin propanediol (FARXIGA) 10 MG TABS tablet Take 1 tablet (10 mg total) by mouth daily before breakfast. 30 tablet 5   Ferrous Sulfate (IRON) 325 (65 Fe) MG TABS Take 1 tablet by mouth. Taking three times a week     levothyroxine (SYNTHROID) 50 MCG tablet AS DIRECTED ORALLY ONCE A DAY X6 DAYX AND SKIP ON SUNDAYS 90 DAYS     montelukast (SINGULAIR) 10 MG tablet Take 1 tablet (10 mg total) by mouth at bedtime. 30 tablet 6   nitroGLYCERIN (NITROSTAT) 0.4 MG SL tablet Place 1 tablet (0.4 mg total) under the tongue every 5 (five) minutes as needed for chest pain. 90 tablet 3   sacubitril-valsartan (ENTRESTO) 24-26 MG Take 1 tablet by mouth 2 (two) times daily. 60 tablet 5   Turmeric 500 MG CAPS Take 1 capsule by mouth at bedtime.     levothyroxine (SYNTHROID) 25 MCG tablet Take 25 mcg by mouth daily before breakfast. Per patient taking 2 tablets on Monday-Saturday, do not take on Sunday     No  facility-administered medications prior to visit.    ROS: Review of Systems  Constitutional:  Positive for fatigue. Negative for appetite change and unexpected weight change.  HENT:  Negative for congestion, nosebleeds, sneezing, sore throat and trouble swallowing.   Eyes:  Negative for itching and visual disturbance.  Respiratory:  Positive for cough.   Cardiovascular:  Negative for chest pain, palpitations and leg swelling.  Gastrointestinal:  Negative for abdominal distention, blood in stool, diarrhea and nausea.  Genitourinary:  Negative for frequency and hematuria.  Musculoskeletal:  Negative for back pain, gait problem, joint swelling and neck pain.  Skin:  Negative for rash.  Neurological:  Negative for dizziness, tremors, speech difficulty and weakness.  Psychiatric/Behavioral:  Negative for agitation, dysphoric mood and sleep disturbance. The patient is not nervous/anxious.    Objective:  BP 108/66 (BP Location: Left Arm, Patient Position: Sitting, Cuff Size: Large)    Pulse (!) 50    Temp 97.7 F (36.5 C) (Oral)    Ht 6\' 1"  (1.854 m)    Wt 185 lb 9.6 oz (84.2 kg)    SpO2 99%    BMI 24.49 kg/m   BP Readings from Last 3 Encounters:  11/14/21 108/66  11/09/21 116/72  11/02/21 103/67    Wt Readings from Last 3 Encounters:  11/14/21 185  lb 9.6 oz (84.2 kg)  11/09/21 184 lb (83.5 kg)  11/02/21 184 lb (83.5 kg)    Physical Exam Constitutional:      General: He is not in acute distress.    Appearance: He is well-developed.     Comments: NAD  Eyes:     Conjunctiva/sclera: Conjunctivae normal.     Pupils: Pupils are equal, round, and reactive to light.  Neck:     Thyroid: No thyromegaly.     Vascular: No JVD.  Cardiovascular:     Rate and Rhythm: Normal rate and regular rhythm.     Heart sounds: Normal heart sounds. No murmur heard.   No friction rub. No gallop.  Pulmonary:     Effort: Pulmonary effort is normal. No respiratory distress.     Breath sounds: Normal  breath sounds. No wheezing or rales.  Chest:     Chest wall: No tenderness.  Abdominal:     General: Bowel sounds are normal. There is no distension.     Palpations: Abdomen is soft. There is no mass.     Tenderness: There is no abdominal tenderness. There is no guarding or rebound.  Musculoskeletal:        General: No tenderness. Normal range of motion.     Cervical back: Normal range of motion.  Lymphadenopathy:     Cervical: No cervical adenopathy.  Skin:    General: Skin is warm and dry.     Findings: No rash.  Neurological:     Mental Status: He is alert and oriented to person, place, and time.     Cranial Nerves: No cranial nerve deficit.     Motor: No abnormal muscle tone.     Coordination: Coordination normal.     Gait: Gait normal.     Deep Tendon Reflexes: Reflexes are normal and symmetric.  Psychiatric:        Behavior: Behavior normal.        Thought Content: Thought content normal.        Judgment: Judgment normal.    Lab Results  Component Value Date   WBC 9.3 08/21/2021   HGB 13.9 08/21/2021   HCT 41.0 08/21/2021   PLT 215 08/21/2021   GLUCOSE 133 (H) 11/09/2021   CHOL 209 (H) 07/10/2021   TRIG 211 (H) 07/10/2021   HDL 51 07/10/2021   LDLDIRECT 122.0 05/09/2015   LDLCALC 121 (H) 07/10/2021   ALT 21 08/21/2021   AST 27 08/21/2021   NA 139 11/09/2021   K 4.2 11/09/2021   CL 100 11/09/2021   CREATININE 1.14 11/09/2021   BUN 17 11/09/2021   CO2 20 11/09/2021   TSH 3.33 01/13/2021   PSA 4.83 (H) 04/19/2016   INR 1.0 08/21/2021   HGBA1C 5.6 10/11/2016    MR ANGIO HEAD WO CONTRAST  Result Date: 09/19/2021 CLINICAL DATA:  Transient ischemic attack. EXAM: MRA NECK WITHOUT AND WITH CONTRAST MRA HEAD WITHOUT CONTRAST TECHNIQUE: Multiplanar and multiecho pulse sequences of the neck were obtained without and with intravenous contrast. Angiographic images of the neck were obtained using MRA technique without and with intravenous contrast; Angiographic images of  the Circle of Willis were obtained using MRA technique without intravenous contrast. CONTRAST:  71mL MULTIHANCE GADOBENATE DIMEGLUMINE 529 MG/ML IV SOLN COMPARISON:  MRI of the brain August 21, 2021. FINDINGS: MRA NECK FINDINGS Aortic arch: Normal variant aortic arch branching pattern with the brachiocephalic and left common carotid arteries sharing a common origin. The visualized subclavian arteries are normal.  Right carotid system: Normal course and caliber without stenosis or evidence of dissection. Left carotid system: Normal course and caliber without stenosis or evidence of dissection. Vertebral arteries: Codominant. Vertebral artery origins are normal. Vertebral arteries are normal in course and caliber to the vertebrobasilar confluence without stenosis or evidence of dissection. MRA HEAD FINDINGS The visualized portions of the distal cervical and intracranial internal carotid arteries are widely patent with normal flow related enhancement. The bilateral anterior cerebral arteries are patent with antegrade flow without high-grade flow-limiting stenosis or proximal branch occlusion. Attenuation of the flow related enhancement at the bilateral M1/MCA bifurcation and proximal M2 branches is artifactual. No intracranial aneurysm within the anterior circulation. Attenuation of the flow related enhancement in the bilateral V3-V4 junction is artifactual. Vertebral arteries are otherwise unremarkable. Vertebrobasilar junction and basilar artery are widely patent with antegrade flow without evidence of basilar stenosis or aneurysm. Posterior cerebral arteries are normal bilaterally. No intracranial aneurysm within the posterior circulation. IMPRESSION: No significant vascular abnormality of the of the major arteries of the head and neck. Electronically Signed   By: Pedro Earls M.D.   On: 09/19/2021 11:14   MR ANGIO NECK W WO CONTRAST  Result Date: 09/19/2021 CLINICAL DATA:  Transient ischemic  attack. EXAM: MRA NECK WITHOUT AND WITH CONTRAST MRA HEAD WITHOUT CONTRAST TECHNIQUE: Multiplanar and multiecho pulse sequences of the neck were obtained without and with intravenous contrast. Angiographic images of the neck were obtained using MRA technique without and with intravenous contrast; Angiographic images of the Circle of Willis were obtained using MRA technique without intravenous contrast. CONTRAST:  33mL MULTIHANCE GADOBENATE DIMEGLUMINE 529 MG/ML IV SOLN COMPARISON:  MRI of the brain August 21, 2021. FINDINGS: MRA NECK FINDINGS Aortic arch: Normal variant aortic arch branching pattern with the brachiocephalic and left common carotid arteries sharing a common origin. The visualized subclavian arteries are normal. Right carotid system: Normal course and caliber without stenosis or evidence of dissection. Left carotid system: Normal course and caliber without stenosis or evidence of dissection. Vertebral arteries: Codominant. Vertebral artery origins are normal. Vertebral arteries are normal in course and caliber to the vertebrobasilar confluence without stenosis or evidence of dissection. MRA HEAD FINDINGS The visualized portions of the distal cervical and intracranial internal carotid arteries are widely patent with normal flow related enhancement. The bilateral anterior cerebral arteries are patent with antegrade flow without high-grade flow-limiting stenosis or proximal branch occlusion. Attenuation of the flow related enhancement at the bilateral M1/MCA bifurcation and proximal M2 branches is artifactual. No intracranial aneurysm within the anterior circulation. Attenuation of the flow related enhancement in the bilateral V3-V4 junction is artifactual. Vertebral arteries are otherwise unremarkable. Vertebrobasilar junction and basilar artery are widely patent with antegrade flow without evidence of basilar stenosis or aneurysm. Posterior cerebral arteries are normal bilaterally. No intracranial  aneurysm within the posterior circulation. IMPRESSION: No significant vascular abnormality of the of the major arteries of the head and neck. Electronically Signed   By: Pedro Earls M.D.   On: 09/19/2021 11:14    Assessment & Plan:   Problem List Items Addressed This Visit     Asthma - Primary    Worse Cont w/Symbicort Medrol - po Z pac CXR Hycodan prn      Relevant Medications   predniSONE (DELTASONE) 10 MG tablet   Other Relevant Orders   DG Chest 2 View   Cough   Relevant Orders   DG Chest 2 View   TIA (transient ischemic  attack)    No relapse         Meds ordered this encounter  Medications   HYDROcodone bit-homatropine (HYCODAN) 5-1.5 MG/5ML syrup    Sig: Take 5 mLs by mouth every 6 (six) hours as needed for cough.    Dispense:  240 mL    Refill:  0   predniSONE (DELTASONE) 10 MG tablet    Sig: Prednisone 10 mg: take 4 tabs a day x 3 days; then 3 tabs a day x 4 days; then 2 tabs a day x 4 days, then 1 tab a day x 6 days, then stop. Take pc.    Dispense:  38 tablet    Refill:  1   azithromycin (ZITHROMAX Z-PAK) 250 MG tablet    Sig: As directed    Dispense:  6 tablet    Refill:  0      Follow-up: Return for a follow-up visit.  [name], MD

## 2021-11-14 NOTE — Assessment & Plan Note (Addendum)
Worse Cont w/Symbicort Medrol - po Z pac CXR Hycodan prn

## 2021-11-20 ENCOUNTER — Encounter: Payer: Self-pay | Admitting: Pharmacist

## 2021-12-07 ENCOUNTER — Ambulatory Visit (INDEPENDENT_AMBULATORY_CARE_PROVIDER_SITE_OTHER): Payer: Medicare Other | Admitting: Neurology

## 2021-12-07 ENCOUNTER — Encounter: Payer: Self-pay | Admitting: Neurology

## 2021-12-07 VITALS — BP 111/71 | HR 52 | Wt 186.2 lb

## 2021-12-07 DIAGNOSIS — G43009 Migraine without aura, not intractable, without status migrainosus: Secondary | ICD-10-CM

## 2021-12-07 DIAGNOSIS — I25810 Atherosclerosis of coronary artery bypass graft(s) without angina pectoris: Secondary | ICD-10-CM | POA: Diagnosis not present
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
## 2021-12-07 NOTE — Patient Instructions (Addendum)
I had a long discussion with the patient and his wife regarding his episode of fleeting paresthesias, garbled speech and disorientation with negative neurovascular studies likely suggesting complicated migraine episode.  He does have prior history of cryptogenic right occipital infarct in 2014 hence recommend he continue aspirin for stroke prevention and maintain aggressive risk factor modification with strict control of hypertension blood pressure goal below 130/90, LDL cholesterol goal below 70 mg percent and diabetes with hemoglobin A1c goal below 6.5%.  He will return for follow-up in the future in 6 months or call earlier if necessary . ?

## 2021-12-13 ENCOUNTER — Ambulatory Visit: Payer: Medicare Other

## 2021-12-20 NOTE — Progress Notes (Signed)
Patient ID: BION TODOROV                 DOB: [birth year]                      MRN: [id number] ? ? ? ? ?HPI: ?Richard Ellis is a 76 y.o. male referred by Dr. Burt Knack to pharmacy clinic for HF medication management. PMH is significant for CAD s/p CABG in 2018, CVA in 2014, PFO, HLD, bradycardia, and angina. Cath in 11/03/20 showed severe mid LAD stenosis and DES was placed to mid LAD. Pt was admitted with TIA on 08/21/21. CT scan of the brain and an MRI of the brain were essentially unremarkable with no acute infarct demonstrated. Echo 08/30/21 was 45-50%, with no evidence of blood flow limitation. Due to mild/mod LV dysfunction, pt was started on Entresto 24/'26mg'$  BID and referred to PharmD for f/u.  ? ?I saw pt on 10/17/21 and had not started Entresto yet because copay was $200. I provided him with a copay card and pt was able to start med. On 11/09/21, pt was tolerating Entresto and started on Farxiga 10 mg daily. Pt messaged on 11/20/21 stating he still was not able to get his [place] after last visit d/t needing a prior auth. Able to pick up Farxiga on 12/01/21 and started it same day. Has seen his PCP and neurology since [facility] appt. BP at visits were 111/71 and 108/66. HR was 50.  ? ?Pt also has intolerances to 4 statins and Repatha. Also tried ezetimibe in the past but ? less effective. Currently on Praluent and tolerating well. ? ?Pt presents to clinic today for HF medication management. Is compliant with meds with no missed doses.Tolerating [place] and Entresto for the most part. Occasionally feels dizzy after standing, but is fine after waiting a few seconds. Denies HA, blurred vision, and LEE. Started monitoring BP at home ~1 hr after taking his meds and brought in home log. Was on prednisone for a cough from 11/14/21 to 12/09/21. Does not remember why he stopped Zetia only it was a short course.  ? ?Home cuff readings: range 97/60 to 133/81 (during prednisone course), average - 110s/68-72 ?Home BP cuff in clinic -  116/69 ?Clinic reading - 122/72 ? ?Current CHF meds: Farxiga 10 mg daily,  Entresto 24/26 mg twice daily ?BP goal: <130/80 ? ?Current HLD meds: Praulent 75 mg SQ every 2 weeks ?Intolerances: Repatha 140 mg [place] every 2 weeks, pitavastatin 2 mg daily, rosuvastatin 5 mg daily, simvastatin 20 mg daily, atorvastatin (unknown dose) - muscle weakness; niacin - itching & rash; Lovaza & Zetia - unknown reactions ?LDL goal: '55mg'$ /dL ? ?Family History: COPD in his mother; coronary artery disease in an other family member; Heart disease in his father and paternal uncle. ? ?Social History: Denies tobacco use, 8 alcoholic drinks/week previously, but has cut down on drinking ? ?Diet: Has changed his diet - decreased intake at dinner and cut back on carbs ? ?Exercise: Runs and rides his bike ? ?Labs (07/10/21): TC 209, TG 211, HDL 51, LDL 121 (Praulent 75 mg SQ every 2 weeks) ? ?Wt Readings from Last 3 Encounters:  ?12/07/21 186 lb 3.2 oz (84.5 kg)  ?11/14/21 185 lb 9.6 oz (84.2 kg)  ?11/09/21 184 lb (83.5 kg)  ? ?BP Readings from Last 3 Encounters:  ?12/07/21 111/71  ?11/14/21 108/66  ?11/09/21 116/72  ? ?Pulse Readings from Last 3 Encounters:  ?12/07/21 (!) 52  ?11/14/21 [name])  50  ?11/09/21 64  ? ? ?Renal function: ?CrCl cannot be calculated (Patient's most recent lab result is older than the maximum 21 days allowed.). ? ?Past Medical History:  ?Diagnosis Date  ? Allergy   ? rhinitis  ? Anemia   ? "Hgb always on the low side" (07/02/2013)  ? CAD (coronary artery disease)   ? a. LHC 2/17: pLAD 50, mLAD 95, pRCA 30, dRCA 25, EF 50-55% >> PCI:  3 x 18 mm Resolute DES to mLAD   ? Chronic bronchitis ([place])   ? "not in the last year since I started taking allergy shots" (10/09/2016)  ? Chronic lower back pain   ? Chronic sinus bradycardia 07/02/2013  ? Coronary artery disease 10/15/2016  ? Cryptogenic stroke ([place]) 07/02/2013  ? a. "small ischemic occipital right sided" (07/02/2013)  //  b. Event monitor 10/14: sinus brady  //  c. Carotid US  10/14: bilat ICA 1-39%  ? Esophageal stricture   ? Exertional angina ([place]) 10/09/2016  ? Heart murmur   ? "as a child" (07/02/2013)  ? History of echocardiogram   ? a. Echo 10/14: EF 60-65%, no RWMA, normal diastolic function  ? Hyperlipidemia   ? Hypothyroidism   ? Nonallopathic lesion of lumbosacral region 06/04/2017  ? Nonallopathic lesion of sacral region 06/04/2017  ? Nonallopathic lesion of thoracic region 06/04/2017  ? Osteoarthritis   ? "lower back" (10/09/2016)  ? PFO (patent foramen ovale)   ? a. TEE 11/14: mild LVH, EF 60-65%, small PFO, R-L shunt  ? ? ?Current Outpatient Medications on File Prior to Visit  ?Medication Sig Dispense Refill  ? Alirocumab (PRALUENT) 75 MG/ML SOAJ INJECT 1 ML INTO THE SKIN EVERY 14 (FOURTEEN) DAYS. 2 mL 11  ? ALPRAZolam (XANAX) 0.5 MG tablet Take 1-2 tablets (0.5-1 mg total) by mouth 3 (three) times daily as needed for anxiety (anxiety attack). (Patient not taking: Reported on 12/07/2021) 30 tablet 0  ? aspirin EC 81 MG tablet Take 1 tablet (81 mg total) by mouth daily. 90 tablet 3  ? budesonide-formoterol (SYMBICORT) 80-4.5 MCG/ACT inhaler Inhale 2 puffs into the lungs daily. 1 each 6  ? Cholecalciferol 2000 UNITS CAPS Take 2,000 Units by mouth every evening.    ? dapagliflozin propanediol (FARXIGA) 10 MG TABS tablet Take 1 tablet (10 mg total) by mouth daily before breakfast. 30 tablet 5  ? Ferrous Sulfate (IRON) 325 (65 Fe) MG TABS Take 1 tablet by mouth. Taking three times a week    ? levothyroxine (SYNTHROID) 50 MCG tablet AS DIRECTED ORALLY ONCE A DAY X6 DAYX AND SKIP ON SUNDAYS 90 DAYS    ? montelukast (SINGULAIR) 10 MG tablet Take 1 tablet (10 mg total) by mouth at bedtime. 30 tablet 6  ? sacubitril-valsartan (ENTRESTO) 24-26 MG Take 1 tablet by mouth 2 (two) times daily. 60 tablet 5  ? Turmeric 500 MG CAPS Take 1 capsule by mouth at bedtime.    ? ?No current facility-administered medications on file prior to visit.  ? ? ?Allergies  ?Allergen Reactions  ? Niacin Er Other  (See Comments)  ? Statins Other (See Comments)  ? Atorvastatin Other (See Comments)  ?  Muscle weakness  ? Crestor [Rosuvastatin] Other (See Comments)  ?  Muscle weakness  ? Demerol [Meperidine] Nausea Only  ? Niacin And Related Itching, Rash and Other (See Comments)  ?  Palms turned red and itched  ? Repatha [Evolocumab] Other (See Comments)  ?  Muscle weakness  ? ? ? ?  Assessment/Plan: ? ?1. HFmfEF - BP at goal < 130/80 mmHg. Will check BMET today. Continue Entresto 24-26 mg BID and Farxiga 10 mg daily. Not increasing Entresto dose today or adding MRA due to lower BPs at home with systolic of 641. Not starting BB as baseline HR already in the 50s. ? ?2. HLD - LDL 121 above goal <55 on Praluent therapy. Continue Praulent 75 mg [place] every 2 weeks. He is intolerant to 4 statins and Repatha. Discussed efficacy and side effect profile of Nexlizet. Pt declines today as he does not want to take additional medication. Will recheck updated lipids today given dietary improvements since last lab work - he is not fasting. ? ?F/u with PharmD as needed. ? ?Patient seen with Debria Garret, [employer] pharmacy student ? ?Megan E. Supple, PharmD, BCACP, CPP ?[facility] [place], [place] [address] ?Phone: [phone]; Fax: [phone] ?12/21/2021 3:32 PM ? ? ? ?

## 2021-12-21 ENCOUNTER — Ambulatory Visit (INDEPENDENT_AMBULATORY_CARE_PROVIDER_SITE_OTHER): Payer: Medicare Other | Admitting: Pharmacist

## 2021-12-21 VITALS — BP 122/72 | HR 58 | Wt 183.5 lb

## 2021-12-21 DIAGNOSIS — I509 Heart failure, unspecified: Secondary | ICD-10-CM

## 2021-12-21 DIAGNOSIS — E782 Mixed hyperlipidemia: Secondary | ICD-10-CM

## 2021-12-21 DIAGNOSIS — I25810 Atherosclerosis of coronary artery bypass graft(s) without angina pectoris: Secondary | ICD-10-CM | POA: Diagnosis not present

## 2021-12-21 NOTE — Patient Instructions (Addendum)
Your LDL cholesterol goal is < 55 and your most recent LDL was 121 ? ?We'll recheck your baseline labs today. Nexlizet is a daily pill that would lower your LDL an additional 40-45% ? ?Continue taking your other cardiac medications, your blood pressure is excellent ?

## 2021-12-22 ENCOUNTER — Telehealth: Payer: Self-pay | Admitting: Pharmacist

## 2021-12-22 LAB — COMPREHENSIVE METABOLIC PANEL
ALT: 16 IU/L (ref 0–44)
AST: 21 IU/L (ref 0–40)
Albumin/Globulin Ratio: 2.3 — ABNORMAL HIGH (ref 1.2–2.2)
Albumin: 4.4 g/dL (ref 3.7–4.7)
Alkaline Phosphatase: 49 IU/L (ref 44–121)
BUN/Creatinine Ratio: 22 (ref 10–24)
BUN: 21 mg/dL (ref 8–27)
Bilirubin Total: 0.3 mg/dL (ref 0.0–1.2)
CO2: 22 mmol/L (ref 20–29)
Calcium: 9.3 mg/dL (ref 8.6–10.2)
Chloride: 103 mmol/L (ref 96–106)
Creatinine, Ser: 0.97 mg/dL (ref 0.76–1.27)
Globulin, Total: 1.9 g/dL (ref 1.5–4.5)
Glucose: 110 mg/dL — ABNORMAL HIGH (ref 70–99)
Potassium: 4.5 mmol/L (ref 3.5–5.2)
Sodium: 140 mmol/L (ref 134–144)
Total Protein: 6.3 g/dL (ref 6.0–8.5)
eGFR: 81 mL/min/{1.73_m2} (ref >59)

## 2021-12-22 LAB — LIPID PANEL
Chol/HDL Ratio: 3.9 ratio (ref 0.0–5.0)
Cholesterol, Total: 184 mg/dL (ref 100–199)
HDL: 47 mg/dL (ref >39)
LDL Chol Calc (NIH): 97 mg/dL (ref 0–99)
Triglycerides: 236 mg/dL — ABNORMAL HIGH (ref 0–149)
VLDL Cholesterol Cal: 40 mg/dL (ref 5–40)

## 2021-12-22 LAB — LDL CHOLESTEROL, DIRECT: LDL Direct: 106 mg/dL — ABNORMAL HIGH (ref 0–99)

## 2021-12-22 NOTE — Telephone Encounter (Signed)
BMET stable after addition of SGLT2i. TG remain mildly elevated but pt not fasting when labs were checked. LDL has improved a bit more but remains elevated above his goal < 55. Discussed adding on Nexlizet yesterday but pt declined. He will continue on Praluent injections and will let me know if he wishes to try Nexlizet in the future.  ?

## 2022-01-03 ENCOUNTER — Telehealth: Payer: Medicare Other | Admitting: Physician Assistant

## 2022-01-03 DIAGNOSIS — G8929 Other chronic pain: Secondary | ICD-10-CM

## 2022-01-03 DIAGNOSIS — M25511 Pain in right shoulder: Secondary | ICD-10-CM | POA: Diagnosis not present

## 2022-01-03 DIAGNOSIS — I25810 Atherosclerosis of coronary artery bypass graft(s) without angina pectoris: Secondary | ICD-10-CM | POA: Diagnosis not present

## 2022-01-03 MED ORDER — DICLOFENAC SODIUM 2 % EX SOLN: 1.0000 | Freq: Two times a day (BID) | CUTANEOUS | 1 refills | Status: DC

## 2022-01-03 NOTE — Progress Notes (Signed)
?Virtual Visit Consent  ? ?Richard Ellis, you are scheduled for a virtual visit with a [facility] provider today.   ?  ?Just as with appointments in the office, your consent must be obtained to participate.  Your consent will be active for this visit and any virtual visit you may have with one of our providers in the next 365 days.   ?  ?If you have a MyChart account, a copy of this consent can be sent to you electronically.  All virtual visits are billed to your insurance company just like a traditional visit in the office.   ? ?As this is a virtual visit, video technology does not allow for your provider to perform a traditional examination.  This may limit your provider's ability to fully assess your condition.  If your provider identifies any concerns that need to be evaluated in person or the need to arrange testing (such as labs, EKG, etc.), we will make arrangements to do so.   ?  ?Although advances in technology are sophisticated, we cannot ensure that it will always work on either your end or our end.  If the connection with a video visit is poor, the visit may have to be switched to a telephone visit.  With either a video or telephone visit, we are not always able to ensure that we have a secure connection.    ? ?Also, by engaging in this virtual visit, you consent to the provision of healthcare. Additionally, you authorize for your insurance to be billed (if applicable) for the services provided during this visit.  ? ?I need to obtain your verbal consent now.   Are you willing to proceed with your visit today?  ?  ?Richard Ellis has provided verbal consent on 01/03/2022 for a virtual visit (video or telephone). ?  ?Mar Daring, PA-C  ? ?Date: 01/03/2022 4:30 PM ? ? ?Virtual Visit via Video Note  ? ?Richard Ellis, connected with  Richard Ellis  ([id number], [birth year]) on 01/03/22 at  4:00 PM EDT by a video-enabled telemedicine application and verified that I am speaking with the correct person  using two identifiers. ? ?Location: ?Patient: Virtual Visit Location Patient: Home ?Provider: Virtual Visit Location Provider: Home Office ?  ?I discussed the limitations of evaluation and management by telemedicine and the availability of in person appointments. The patient expressed understanding and agreed to proceed.   ? ?History of Present Illness: ?Richard Ellis is a 76 y.o. who identifies as a male who was assigned male at birth, and is being seen today for shoulder pain. ? ?HPI: Shoulder Pain  ?The pain is present in the right shoulder. This is a new problem. The current episode started more than 1 year ago (started bothering him more acutely over the last week after starting to weightlift some). There has been no history of extremity trauma. The problem occurs daily. The problem has been gradually worsening. The quality of the pain is described as aching and dull. The pain is moderate. Associated symptoms include a limited range of motion and stiffness. Pertinent negatives include no fever, inability to bear weight, joint locking, joint swelling, numbness or tingling. The symptoms are aggravated by activity and lying down. He has tried acetaminophen, movement and rest for the symptoms. The treatment provided no relief. His past medical history is significant for osteoarthritis.   ?Has seen Dr. Hulan Saas, DO in the past for this, last visit 08/03/20. Has used Pennsaid 2%  that helped in the past.  ? ?Problems:  ?Patient Active Problem List  ? Diagnosis Date Noted  ? Right foot pain 11/02/2021  ? TIA (transient ischemic attack) 08/31/2021  ? Anxiety 08/31/2021  ? Statin myopathy 01/13/2021  ? Rotator cuff tear 06/29/2020  ? Degenerative lumbar spinal stenosis 12/03/2019  ? Essential (primary) hypertension 10/27/2019  ? Lumbar degenerative disc disease 10/27/2019  ? Nonallopathic lesion of lumbosacral region 06/04/2017  ? Nonallopathic lesion of sacral region 06/04/2017  ? Nonallopathic lesion of thoracic  region 06/04/2017  ? Blood loss anemia 10/29/2016  ? Coronary artery disease 10/15/2016  ? Exertional angina ([place]) 10/09/2016  ? Arteriosclerosis of coronary artery bypass graft 11/11/2015  ? Patent foramen ovale 11/07/2015  ? Labral tear of hip, degenerative 11/07/2015  ? Tear of left hamstring 11/07/2015  ? Spondylosis of lumbar region without myelopathy or radiculopathy 09/06/2015  ? Otitis, externa, infective 12/23/2014  ? Dyslipidemia 06/08/2014  ? Gluteus medius or minimus syndrome 02/25/2014  ? History of CVA (cerebrovascular accident) 07/02/2013  ? Chronic sinus bradycardia 07/02/2013  ? Actinic keratoses 01/29/2013  ? Elevated PSA 08/13/2012  ? Well adult exam 08/11/2012  ? HIP PAIN 11/14/2010  ? GROIN PAIN 11/14/2010  ? Neoplasm of uncertain behavior of skin 10/27/2010  ? Asthma 10/27/2010  ? OSTEOARTHRITIS 10/27/2010  ? Myalgia 10/27/2010  ? Fatigue 12/23/2008  ? Cough 10/15/2008  ? CBC, ABNORMAL 10/07/2008  ? Other abnormal glucose 11/04/2007  ? Hypothyroidism 07/10/2007  ? Hyperlipidemia 07/10/2007  ?  ?Allergies:  ?Allergies  ?Allergen Reactions  ? Niacin Er Other (See Comments)  ? Statins Other (See Comments)  ? Atorvastatin Other (See Comments)  ?  Muscle weakness  ? Crestor [Rosuvastatin] Other (See Comments)  ?  Muscle weakness  ? Demerol [Meperidine] Nausea Only  ? Niacin And Related Itching, Rash and Other (See Comments)  ?  Palms turned red and itched  ? Repatha [Evolocumab] Other (See Comments)  ?  Muscle weakness  ? ?Medications:  ?Current Outpatient Medications:  ?  Diclofenac Sodium (PENNSAID) 2 % SOLN, Apply 1-2 Pump topically 2 (two) times daily., Disp: 224 g, Rfl: 1 ?  Alirocumab (PRALUENT) 75 MG/ML SOAJ, INJECT 1 ML INTO THE SKIN EVERY 14 (FOURTEEN) DAYS., Disp: 2 mL, Rfl: 11 ?  ALPRAZolam (XANAX) 0.5 MG tablet, Take 0.5 mg by mouth as needed for anxiety (for flying)., Disp: , Rfl:  ?  aspirin EC 81 MG tablet, Take 1 tablet (81 mg total) by mouth daily., Disp: 90 tablet, Rfl: 3 ?   budesonide-formoterol (SYMBICORT) 80-4.5 MCG/ACT inhaler, Inhale 2 puffs into the lungs daily., Disp: 1 each, Rfl: 6 ?  Cholecalciferol 2000 UNITS CAPS, Take 2,000 Units by mouth every evening., Disp: , Rfl:  ?  dapagliflozin propanediol (FARXIGA) 10 MG TABS tablet, Take 1 tablet (10 mg total) by mouth daily before breakfast., Disp: 30 tablet, Rfl: 5 ?  Ferrous Sulfate (IRON) 325 (65 Fe) MG TABS, Take 1 tablet by mouth. Taking three times a week, Disp: , Rfl:  ?  levothyroxine (SYNTHROID) 50 MCG tablet, AS DIRECTED ORALLY ONCE A DAY X6 DAYX AND SKIP ON SUNDAYS 90 DAYS, Disp: , Rfl:  ?  montelukast (SINGULAIR) 10 MG tablet, Take 1 tablet (10 mg total) by mouth at bedtime., Disp: 30 tablet, Rfl: 6 ?  sacubitril-valsartan (ENTRESTO) 24-26 MG, Take 1 tablet by mouth 2 (two) times daily., Disp: 60 tablet, Rfl: 5 ?  Turmeric 500 MG CAPS, Take 1 capsule by mouth at bedtime., Disp: ,  Rfl:  ? ?Observations/Objective: ?Patient is well-developed, well-nourished in no acute distress.  ?Resting comfortably at home.  ?Head is normocephalic, atraumatic.  ?No labored breathing.  ?Speech is clear and coherent with logical content.  ?Patient is alert and oriented at baseline.  ? ? ?Assessment and Plan: ?1. Chronic right shoulder pain ?- Diclofenac Sodium (PENNSAID) 2 % SOLN; Apply 1-2 Pump topically 2 (two) times daily.  Dispense: 224 g; Refill: 1 ? ?- Acute on chronic issue ?- Has degenerative rotator cuff tear, AC joint arthritis, and glenohumeral joint arthritis ?- Diclofenac 2% prescribed as above, safer than oral NSAIDs due to cardiac history, just completed a long taper of prednisone over 17 days (finished around 03/16 or 12/01/21) due to asthma and has had borderline elevated sugars in the past so will avoid oral steroid for now ?- Can take tylenol intermittently ?- Exercises and stretches sent via AVS ?- Advised to follow up with Dr. Tamala Julian since he is aware of his history for further evaluation and treatment options if pain  persist; he voices understanding ? ?Follow Up Instructions: ?I discussed the assessment and treatment plan with the patient. The patient was provided an opportunity to ask questions and all were answered. The

## 2022-01-03 NOTE — Patient Instructions (Signed)
?Richard Ellis, thank you for joining Mar Daring, PA-C for today's virtual visit.  While this provider is not your primary care provider (PCP), if your PCP is located in our provider database this encounter information will be shared with them immediately following your visit. ? ?Consent: ?(Patient) Richard Ellis provided verbal consent for this virtual visit at the beginning of the encounter. ? ?Current Medications: ? ?Current Outpatient Medications:  ?  Diclofenac Sodium (PENNSAID) 2 % SOLN, Apply 1-2 Pump topically 2 (two) times daily., Disp: 224 g, Rfl: 1 ?  Alirocumab (PRALUENT) 75 MG/ML SOAJ, INJECT 1 ML INTO THE SKIN EVERY 14 (FOURTEEN) DAYS., Disp: 2 mL, Rfl: 11 ?  ALPRAZolam (XANAX) 0.5 MG tablet, Take 0.5 mg by mouth as needed for anxiety (for flying)., Disp: , Rfl:  ?  aspirin EC 81 MG tablet, Take 1 tablet (81 mg total) by mouth daily., Disp: 90 tablet, Rfl: 3 ?  budesonide-formoterol (SYMBICORT) 80-4.5 MCG/ACT inhaler, Inhale 2 puffs into the lungs daily., Disp: 1 each, Rfl: 6 ?  Cholecalciferol 2000 UNITS CAPS, Take 2,000 Units by mouth every evening., Disp: , Rfl:  ?  dapagliflozin propanediol (FARXIGA) 10 MG TABS tablet, Take 1 tablet (10 mg total) by mouth daily before breakfast., Disp: 30 tablet, Rfl: 5 ?  Ferrous Sulfate (IRON) 325 (65 Fe) MG TABS, Take 1 tablet by mouth. Taking three times a week, Disp: , Rfl:  ?  levothyroxine (SYNTHROID) 50 MCG tablet, AS DIRECTED ORALLY ONCE A DAY X6 DAYX AND SKIP ON SUNDAYS 90 DAYS, Disp: , Rfl:  ?  montelukast (SINGULAIR) 10 MG tablet, Take 1 tablet (10 mg total) by mouth at bedtime., Disp: 30 tablet, Rfl: 6 ?  sacubitril-valsartan (ENTRESTO) 24-26 MG, Take 1 tablet by mouth 2 (two) times daily., Disp: 60 tablet, Rfl: 5 ?  Turmeric 500 MG CAPS, Take 1 capsule by mouth at bedtime., Disp: , Rfl:   ? ?Medications ordered in this encounter:  ?Meds ordered this encounter  ?Medications  ? Diclofenac Sodium (PENNSAID) 2 % SOLN  ?  Sig: Apply 1-2 Pump  topically 2 (two) times daily.  ?  Dispense:  224 g  ?  Refill:  1  ?  Order Specific Question:   Supervising Provider  ?  Answer:   Noemi Chapel [[id number]]  ?  ? ?*If you need refills on other medications prior to your next appointment, please contact your pharmacy* ? ?Follow-Up: ?Call back or seek an in-person evaluation if the symptoms worsen or if the condition fails to improve as anticipated. ? ?Other Instructions ? ?Shoulder Exercises ?Ask your health care provider which exercises are safe for you. Do exercises exactly as told by your health care provider and adjust them as directed. It is normal to feel mild stretching, pulling, tightness, or discomfort as you do these exercises. Stop right away if you feel sudden pain or your pain gets worse. Do not begin these exercises until told by your health care provider. ?Stretching exercises ?External rotation and abduction ?This exercise is sometimes called corner stretch. This exercise rotates your arm outward (external rotation) and moves your arm out from your body (abduction). ?Stand in a doorway with one of your feet slightly in front of the other. This is called a staggered stance. If you cannot reach your forearms to the door frame, stand facing a corner of a room. ?Choose one of the following positions as told by your health care provider: ?Place your hands and forearms on the door  frame above your head. ?Place your hands and forearms on the door frame at the height of your head. ?Place your hands on the door frame at the height of your elbows. ?Slowly move your weight onto your front foot until you feel a stretch across your chest and in the front of your shoulders. Keep your head and chest upright and keep your abdominal muscles tight. ?Hold for __________ seconds. ?To release the stretch, shift your weight to your back foot. ?Repeat __________ times. Complete this exercise __________ times a day. ?Extension, standing ?Stand and hold a broomstick, a cane, or  a similar object behind your back. ?Your hands should be a little wider than shoulder width apart. ?Your palms should face away from your back. ?Keeping your elbows straight and your shoulder muscles relaxed, move the stick away from your body until you feel a stretch in your shoulders (extension). ?Avoid shrugging your shoulders while you move the stick. Keep your shoulder blades tucked down toward the middle of your back. ?Hold for __________ seconds. ?Slowly return to the starting position. ?Repeat __________ times. Complete this exercise __________ times a day. ?Range-of-motion exercises ?Pendulum ? ?Stand near a wall or a surface that you can hold onto for balance. ?Bend at the waist and let your left / right arm hang straight down. Use your other arm to support you. Keep your back straight and do not lock your knees. ?Relax your left / right arm and shoulder muscles, and move your hips and your trunk so your left / right arm swings freely. Your arm should swing because of the motion of your body, not because you are using your arm or shoulder muscles. ?Keep moving your hips and trunk so your arm swings in the following directions, as told by your health care provider: ?Side to side. ?Forward and backward. ?In clockwise and counterclockwise circles. ?Continue each motion for __________ seconds, or for as long as told by your health care provider. ?Slowly return to the starting position. ?Repeat __________ times. Complete this exercise __________ times a day. ?Shoulder flexion, standing ? ?Stand and hold a broomstick, a cane, or a similar object. Place your hands a little more than shoulder width apart on the object. Your left / right hand should be palm up, and your other hand should be palm down. ?Keep your elbow straight and your shoulder muscles relaxed. Push the stick up with your healthy arm to raise your left / right arm in front of your body, and then over your head until you feel a stretch in your  shoulder (flexion). ?Avoid shrugging your shoulder while you raise your arm. Keep your shoulder blade tucked down toward the middle of your back. ?Hold for __________ seconds. ?Slowly return to the starting position. ?Repeat __________ times. Complete this exercise __________ times a day. ?Shoulder abduction, standing ?Stand and hold a broomstick, a cane, or a similar object. Place your hands a little more than shoulder width apart on the object. Your left / right hand should be palm up, and your other hand should be palm down. ?Keep your elbow straight and your shoulder muscles relaxed. Push the object across your body toward your left / right side. Raise your left / right arm to the side of your body (abduction) until you feel a stretch in your shoulder. ?Do not raise your arm above shoulder height unless your health care provider tells you to do that. ?If directed, raise your arm over your head. ?Avoid shrugging your shoulder while  you raise your arm. Keep your shoulder blade tucked down toward the middle of your back. ?Hold for __________ seconds. ?Slowly return to the starting position. ?Repeat __________ times. Complete this exercise __________ times a day. ?Internal rotation ? ?Place your left / right hand behind your back, palm up. ?Use your other hand to dangle an exercise band, a towel, or a similar object over your shoulder. Grasp the band with your left / right hand so you are holding on to both ends. ?Gently pull up on the band until you feel a stretch in the front of your left / right shoulder. The movement of your arm toward the center of your body is called internal rotation. ?Avoid shrugging your shoulder while you raise your arm. Keep your shoulder blade tucked down toward the middle of your back. ?Hold for __________ seconds. ?Release the stretch by letting go of the band and lowering your hands. ?Repeat __________ times. Complete this exercise __________ times a day. ?Strengthening  exercises ?External rotation ? ?Sit in a stable chair without armrests. ?Secure an exercise band to a stable object at elbow height on your left / right side. ?Place a soft object, such as a folded towel or a small pillow, be

## 2022-01-05 MED ORDER — DICLOFENAC SODIUM 1.5 % EX SOLN: 1.0000 | Freq: Two times a day (BID) | CUTANEOUS | 0 refills | Status: AC | PRN

## 2022-01-05 NOTE — Addendum Note (Signed)
Addended by: Mar Daring on: 01/05/2022 11:42 AM ? ? Modules accepted: Orders ? ?

## 2022-01-15 ENCOUNTER — Ambulatory Visit: Payer: Medicare Other | Admitting: Neurology

## 2022-01-18 ENCOUNTER — Encounter: Payer: Self-pay | Admitting: Pharmacist

## 2022-01-26 ENCOUNTER — Ambulatory Visit (INDEPENDENT_AMBULATORY_CARE_PROVIDER_SITE_OTHER): Payer: Medicare Other | Admitting: Cardiovascular Disease

## 2022-01-26 ENCOUNTER — Encounter: Payer: Self-pay | Admitting: Cardiovascular Disease

## 2022-01-26 VITALS — BP 120/70 | HR 47 | Ht 73.0 in | Wt 181.0 lb

## 2022-01-26 DIAGNOSIS — E782 Mixed hyperlipidemia: Secondary | ICD-10-CM | POA: Diagnosis not present

## 2022-01-26 DIAGNOSIS — I255 Ischemic cardiomyopathy: Secondary | ICD-10-CM | POA: Diagnosis not present

## 2022-01-26 DIAGNOSIS — I25810 Atherosclerosis of coronary artery bypass graft(s) without angina pectoris: Secondary | ICD-10-CM

## 2022-01-26 NOTE — Progress Notes (Signed)
?Cardiology Office Note:   ? ?Date:  01/26/2022  ? ?ID:  Richard Ellis, DOB 27-Jul-1946, MRN [id number] ? ?PCP:  Plotnikov, Evie Lacks, MD ?  ?[facility] Providers ?Cardiologist:  [name], MD    ? ?Referring MD: Cassandria Anger, MD  ? ?Chief Complaint  ?Patient presents with  ? Coronary Artery Disease  ? ? ?History of Present Illness:   ? ?Richard Ellis is a 76 y.o. male with a hx of Coronary artery disease ?Cath 2/17: Severe mid LAD stenosis >> DES to the mid LAD ?Status post CABG 2018 (LIMA-LAD, SVG-D1) ?Patent foramen ovale status post surgical closure ?Prior CVA ?Cryptogenic CVA 2014 >> PFO with R>L shunt noted; treated with Plavix ?Hyperlipidemia ?Intol of statins, PCSK9i  >> Diet Rx ?Hypothyroidism ?Anemia ?Bradycardia -no beta-blocker ?Ischemic cardiomyopathy: mild LV dysfunction LVEF 45-50% ? ?The patient is here alone today.  He continues to jog for exercise but has been slow down for variety of reasons.  He is running 10 to 15 miles per week.  He plans on competing in the senior track games this summer.  They are going to be held at [employer].  He denies any chest pain, chest pressure, or shortness of breath.  He does have postural lightheadedness at times.  No presyncope or frank syncope.  No heart palpitations, orthopnea, or PND.  There was a period of time where he had some difficulty getting his medications and had to go through paperwork with his insurance company.  He is now taking [place] and [place] on a daily basis as prescribed. ? ?Past Medical History:  ?Diagnosis Date  ? Allergy   ? rhinitis  ? Anemia   ? "Hgb always on the low side" (07/02/2013)  ? CAD (coronary artery disease)   ? a. LHC 2/17: pLAD 50, mLAD 95, pRCA 30, dRCA 25, EF 50-55% >> PCI:  3 x 18 mm Resolute DES to mLAD   ? Chronic bronchitis ([place])   ? "not in the last year since I started taking allergy shots" (10/09/2016)  ? Chronic lower back pain   ? Chronic sinus bradycardia 07/02/2013  ? Coronary artery disease 10/15/2016   ? Cryptogenic stroke ([place]) 07/02/2013  ? a. "small ischemic occipital right sided" (07/02/2013)  //  b. Event monitor 10/14: sinus brady  //  c. Carotid US 10/14: bilat ICA 1-39%  ? Esophageal stricture   ? Exertional angina ([place]) 10/09/2016  ? Heart murmur   ? "as a child" (07/02/2013)  ? History of echocardiogram   ? a. Echo 10/14: EF 60-65%, no RWMA, normal diastolic function  ? Hyperlipidemia   ? Hypothyroidism   ? Nonallopathic lesion of lumbosacral region 06/04/2017  ? Nonallopathic lesion of sacral region 06/04/2017  ? Nonallopathic lesion of thoracic region 06/04/2017  ? Osteoarthritis   ? "lower back" (10/09/2016)  ? PFO (patent foramen ovale)   ? a. TEE 11/14: mild LVH, EF 60-65%, small PFO, R-L shunt  ? ? ?Past Surgical History:  ?Procedure Laterality Date  ? CARDIAC CATHETERIZATION N/A 11/10/2015  ? Procedure: Left Heart Cath and Coronary Angiography;  Surgeon: [name], MD;  Location: [place] [facility];  Service: Cardiovascular;  Laterality: N/A;  ? CARDIAC CATHETERIZATION N/A 11/10/2015  ? Procedure: Coronary Stent Intervention;  Surgeon: [name], MD;  Location: [place] [facility];  Service: Cardiovascular;  Laterality: N/A;  mid lad  3.0x18 resolute  ? CARDIAC CATHETERIZATION  10/09/2016  ? "scheduled OHS for tomorrow" (10/09/2016)  ?  CARDIAC CATHETERIZATION N/A 10/09/2016  ? Procedure: Left Heart Cath and Coronary Angiography;  Surgeon: [name], MD;  Location: [place] [facility];  Service: Cardiovascular;  Laterality: N/A;  ? CARDIAC CATHETERIZATION N/A 10/09/2016  ? Procedure: Coronary Balloon Angioplasty;  Surgeon: [name], MD;  Location: [place] [facility];  Service: Cardiovascular;  Laterality: N/A;  ? CORONARY ARTERY BYPASS GRAFT N/A 10/15/2016  ? Procedure: CORONARY ARTERY BYPASS GRAFTING (CABG), ON PUMP, TIMES TWO, USING LEFT INTERNAL MAMMARY ARTERY AND RIGHT GREATER SAPHENOUS VEIN HARVESTED ENDOSCOPICALLY;  Surgeon: Ivin Poot, MD;  Location: [place];  Service: Open Heart  Surgery;  Laterality: N/A;  LIMA to LAD, SVG to DIAGONAL  ? ESOPHAGOGASTRODUODENOSCOPY N/A 10/26/2012  ? Procedure: ESOPHAGOGASTRODUODENOSCOPY (EGD);  Surgeon: Winfield Cunas., MD;  [facility]: [facility];  Service: Endoscopy;  Laterality: N/A;  ? ESOPHAGOGASTRODUODENOSCOPY N/A 07/08/2013  ? Procedure: ESOPHAGOGASTRODUODENOSCOPY (EGD);  Surgeon: Winfield Cunas., MD;  Location: [facility];  Service: Endoscopy;  Laterality: N/A;  ? INNER EAR SURGERY Left 1957  ? "related to ear infection"  ? REPAIR OF PATENT FORAMEN OVALE N/A 10/15/2016  ? Procedure: REPAIR OF PATENT FORAMEN OVALE;  Surgeon: Ivin Poot, MD;  Location: [place];  Service: Open Heart Surgery;  Laterality: N/A;  ? SAVORY DILATION N/A 07/08/2013  ? Procedure: SAVORY DILATION;  Surgeon: Winfield Cunas., MD;  Location: [facility];  Service: Endoscopy;  Laterality: N/A;  ? TEE WITHOUT CARDIOVERSION N/A 07/28/2013  ? Procedure: TRANSESOPHAGEAL ECHOCARDIOGRAM (TEE);  Surgeon: Pixie Casino, MD;  Location: [place];  Service: Cardiovascular;  Laterality: N/A;  ? TEE WITHOUT CARDIOVERSION N/A 10/15/2016  ? Procedure: TRANSESOPHAGEAL ECHOCARDIOGRAM (TEE);  Surgeon: Ivin Poot, MD;  Location: [place];  Service: Open Heart Surgery;  Laterality: N/A;  ? TONSILLECTOMY  1950's  ? ? ?Current Medications: ?Current Meds  ?Medication Sig  ? Alirocumab (PRALUENT) 75 MG/ML SOAJ INJECT 1 ML INTO THE SKIN EVERY 14 (FOURTEEN) DAYS.  ? ALPRAZolam (XANAX) 0.5 MG tablet Take 0.5 mg by mouth as needed for anxiety (for flying).  ? aspirin EC 81 MG tablet Take 1 tablet (81 mg total) by mouth daily.  ? budesonide-formoterol (SYMBICORT) 80-4.5 MCG/ACT inhaler Inhale 2 puffs into the lungs daily.  ? Cholecalciferol 2000 UNITS CAPS Take 2,000 Units by mouth every evening.  ? dapagliflozin propanediol (FARXIGA) 10 MG TABS tablet Take 1 tablet (10 mg total) by mouth daily before breakfast.  ? Diclofenac Sodium 1.5 % SOLN Apply 1-2 Pump topically 2 (two) times daily  as needed.  ? Ferrous Sulfate (IRON) 325 (65 Fe) MG TABS Take 1 tablet by mouth. Taking three times a week  ? levothyroxine (SYNTHROID) 50 MCG tablet AS DIRECTED ORALLY ONCE A DAY X6 DAYX AND SKIP ON SUNDAYS 90 DAYS  ? montelukast (SINGULAIR) 10 MG tablet Take 1 tablet (10 mg total) by mouth at bedtime.  ? sacubitril-valsartan (ENTRESTO) 24-26 MG Take 1 tablet by mouth 2 (two) times daily.  ? Turmeric 500 MG CAPS Take 1 capsule by mouth at bedtime.  ?  ? ?Allergies:   Niacin er, Statins, Atorvastatin, Crestor [rosuvastatin], Demerol [meperidine], Niacin and related, and Repatha [evolocumab]  ? ?Social History  ? ?Socioeconomic History  ? Marital status: Married  ?  Spouse name: Not on file  ? Number of children: 1  ? Years of education: Not on file  ? Highest education level: Not on file  ?Occupational History  ? Occupation: Retired  ?Tobacco Use  ?  Smoking status: Never  ? Smokeless tobacco: Never  ?Vaping Use  ? Vaping Use: Never used  ?Substance and Sexual Activity  ? Alcohol use: Yes  ?  Alcohol/week: 8.0 standard drinks  ?  Types: 4 Glasses of wine, 4 Cans of beer per week  ?  Comment: weekly  ? Drug use: No  ? Sexual activity: Yes  ?Other Topics Concern  ? Not on file  ?Social History Narrative  ? Not on file  ? ?Social Determinants of Health  ? ?Financial Resource Strain: Low Risk   ? Difficulty of Paying Living Expenses: Not hard at all  ?Food Insecurity: No Food Insecurity  ? Worried About Charity fundraiser in the Last Year: Never true  ? Ran Out of Food in the Last Year: Never true  ?Transportation Needs: No Transportation Needs  ? Lack of Transportation (Medical): No  ? Lack of Transportation (Non-Medical): No  ?Physical Activity: Sufficiently Active  ? Days of Exercise per Week: 7 days  ? Minutes of Exercise per Session: 30 min  ?Stress: No Stress Concern Present  ? Feeling of Stress : Not at all  ?Social Connections: Socially Integrated  ? Frequency of Communication with Friends and Family: More than  three times a week  ? Frequency of Social Gatherings with Friends and Family: Twice a week  ? Attends Religious Services: More than 4 times per year  ? Active Member of Clubs or Organizations: Yes

## 2022-01-26 NOTE — Patient Instructions (Signed)
Medication Instructions:  ?Your physician recommends that you continue on your current medications as directed. Please refer to the Current Medication list given to you today. ? ?*If you need a refill on your cardiac medications before your next appointment, please call your pharmacy* ? ? ?Lab Work: ?NONE ?If you have labs (blood work) drawn today and your tests are completely normal, you will receive your results only by: ?MyChart Message (if you have MyChart) OR ?A paper copy in the mail ?If you have any lab test that is abnormal or we need to change your treatment, we will call you to review the results. ? ? ?Testing/Procedures: ?ECHO in 6 months ?Your physician has requested that you have an echocardiogram. Echocardiography is a painless test that uses sound waves to create images of your heart. It provides your doctor with information about the size and shape of your heart and how well your heart?s chambers and valves are working. This procedure takes approximately one hour. There are no restrictions for this procedure. ? ?Follow-Up: ?At [facility], you and your health needs are our priority.  As part of our continuing mission to provide you with exceptional heart care, we have created designated Provider Care Teams.  These Care Teams include your primary Cardiologist (physician) and Advanced Practice Providers (APPs -  Physician Assistants and Nurse Practitioners) who all work together to provide you with the care you need, when you need it. ? ?Your next appointment:   ?1 year(s) ? ?The format for your next appointment:   ?In Person ? ?Provider:   ?[name], MD   ? ?\  ? ?Important Information About Sugar ? ? ? ? ?  ?

## 2022-01-28 ENCOUNTER — Other Ambulatory Visit: Payer: Self-pay | Admitting: Cardiovascular Disease

## 2022-01-28 DIAGNOSIS — E782 Mixed hyperlipidemia: Secondary | ICD-10-CM

## 2022-01-30 NOTE — Progress Notes (Signed)
?Richard Ellis D.O. ?[facility] ?[place] ?Phone: [phone] ?Subjective:   ?I, Richard Ellis, am serving as a scribe for Dr. Hulan Saas. ? ?This visit occurred during the SARS-CoV-2 public health emergency.  Safety protocols were in place, including screening questions prior to the visit, additional usage of staff PPE, and extensive cleaning of exam room while observing appropriate contact time as indicated for disinfecting solutions.  ? ? ?I'm seeing this patient by the request  of:  Plotnikov, Evie Lacks, MD ? ?CC: Right shoulder pain ? ?[id number]  ?Richard Ellis is a 76 y.o. male coming in with complaint of R shoulder pain. Last seen in Nov 2021 for R shoulder pain. Patient states that he is in constant pain in anterior superior aspect of shoulder. Painful to flex and abduct arm. Using Voltaren gel. Denies any radiating symptoms.  ? ? ?  ? ?Past Medical History:  ?Diagnosis Date  ? Allergy   ? rhinitis  ? Anemia   ? "Hgb always on the low side" (07/02/2013)  ? CAD (coronary artery disease)   ? a. LHC 2/17: pLAD 50, mLAD 95, pRCA 30, dRCA 25, EF 50-55% >> PCI:  3 x 18 mm Resolute DES to mLAD   ? Chronic bronchitis ([place])   ? "not in the last year since I started taking allergy shots" (10/09/2016)  ? Chronic lower back pain   ? Chronic sinus bradycardia 07/02/2013  ? Coronary artery disease 10/15/2016  ? Cryptogenic stroke ([place]) 07/02/2013  ? a. "small ischemic occipital right sided" (07/02/2013)  //  b. Event monitor 10/14: sinus brady  //  c. Carotid US 10/14: bilat ICA 1-39%  ? Esophageal stricture   ? Exertional angina ([place]) 10/09/2016  ? Heart murmur   ? "as a child" (07/02/2013)  ? History of echocardiogram   ? a. Echo 10/14: EF 60-65%, no RWMA, normal diastolic function  ? Hyperlipidemia   ? Hypothyroidism   ? Nonallopathic lesion of lumbosacral region 06/04/2017  ? Nonallopathic lesion of sacral region 06/04/2017  ? Nonallopathic lesion of thoracic region 06/04/2017   ? Osteoarthritis   ? "lower back" (10/09/2016)  ? PFO (patent foramen ovale)   ? a. TEE 11/14: mild LVH, EF 60-65%, small PFO, R-L shunt  ? ?Past Surgical History:  ?Procedure Laterality Date  ? CARDIAC CATHETERIZATION N/A 11/10/2015  ? Procedure: Left Heart Cath and Coronary Angiography;  Surgeon: [name], MD;  Location: [place] [facility];  Service: Cardiovascular;  Laterality: N/A;  ? CARDIAC CATHETERIZATION N/A 11/10/2015  ? Procedure: Coronary Stent Intervention;  Surgeon: [name], MD;  Location: [place] [facility];  Service: Cardiovascular;  Laterality: N/A;  mid lad  3.0x18 resolute  ? CARDIAC CATHETERIZATION  10/09/2016  ? "scheduled OHS for tomorrow" (10/09/2016)  ? CARDIAC CATHETERIZATION N/A 10/09/2016  ? Procedure: Left Heart Cath and Coronary Angiography;  Surgeon: [name], MD;  Location: [place] [facility];  Service: Cardiovascular;  Laterality: N/A;  ? CARDIAC CATHETERIZATION N/A 10/09/2016  ? Procedure: Coronary Balloon Angioplasty;  Surgeon: [name], MD;  Location: [place] [facility];  Service: Cardiovascular;  Laterality: N/A;  ? CORONARY ARTERY BYPASS GRAFT N/A 10/15/2016  ? Procedure: CORONARY ARTERY BYPASS GRAFTING (CABG), ON PUMP, TIMES TWO, USING LEFT INTERNAL MAMMARY ARTERY AND RIGHT GREATER SAPHENOUS VEIN HARVESTED ENDOSCOPICALLY;  Surgeon: Ivin Poot, MD;  Location: [place];  Service: Open Heart Surgery;  Laterality: N/A;  LIMA to LAD, SVG to DIAGONAL  ?  ESOPHAGOGASTRODUODENOSCOPY N/A 10/26/2012  ? Procedure: ESOPHAGOGASTRODUODENOSCOPY (EGD);  Surgeon: Winfield Cunas., MD;  Location: [facility];  Service: Endoscopy;  Laterality: N/A;  ? ESOPHAGOGASTRODUODENOSCOPY N/A 07/08/2013  ? Procedure: ESOPHAGOGASTRODUODENOSCOPY (EGD);  Surgeon: Winfield Cunas., MD;  Location: [facility];  Service: Endoscopy;  Laterality: N/A;  ? INNER EAR SURGERY Left 1957  ? "related to ear infection"  ? REPAIR OF PATENT FORAMEN OVALE N/A 10/15/2016  ? Procedure: REPAIR OF PATENT  FORAMEN OVALE;  Surgeon: Ivin Poot, MD;  Location: [place];  Service: Open Heart Surgery;  Laterality: N/A;  ? SAVORY DILATION N/A 07/08/2013  ? Procedure: SAVORY DILATION;  Surgeon: Winfield Cunas., MD;  Location: [facility];  Service: Endoscopy;  Laterality: N/A;  ? TEE WITHOUT CARDIOVERSION N/A 07/28/2013  ? Procedure: TRANSESOPHAGEAL ECHOCARDIOGRAM (TEE);  Surgeon: Pixie Casino, MD;  Location: [place];  Service: Cardiovascular;  Laterality: N/A;  ? TEE WITHOUT CARDIOVERSION N/A 10/15/2016  ? Procedure: TRANSESOPHAGEAL ECHOCARDIOGRAM (TEE);  Surgeon: Ivin Poot, MD;  Location: [place];  Service: Open Heart Surgery;  Laterality: N/A;  ? TONSILLECTOMY  1950's  ? ?Social History  ? ?Socioeconomic History  ? Marital status: Married  ?  Spouse name: Not on file  ? Number of children: 1  ? Years of education: Not on file  ? Highest education level: Not on file  ?Occupational History  ? Occupation: Retired  ?Tobacco Use  ? Smoking status: Never  ? Smokeless tobacco: Never  ?Vaping Use  ? Vaping Use: Never used  ?Substance and Sexual Activity  ? Alcohol use: Yes  ?  Alcohol/week: 8.0 standard drinks  ?  Types: 4 Glasses of wine, 4 Cans of beer per week  ?  Comment: weekly  ? Drug use: No  ? Sexual activity: Yes  ?Other Topics Concern  ? Not on file  ?Social History Narrative  ? Not on file  ? ?Social Determinants of Health  ? ?Financial Resource Strain: Low Risk   ? Difficulty of Paying Living Expenses: Not hard at all  ?Food Insecurity: No Food Insecurity  ? Worried About Charity fundraiser in the Last Year: Never true  ? Ran Out of Food in the Last Year: Never true  ?Transportation Needs: No Transportation Needs  ? Lack of Transportation (Medical): No  ? Lack of Transportation (Non-Medical): No  ?Physical Activity: Sufficiently Active  ? Days of Exercise per Week: 7 days  ? Minutes of Exercise per Session: 30 min  ?Stress: No Stress Concern Present  ? Feeling of Stress : Not at all  ?Social  Connections: Socially Integrated  ? Frequency of Communication with Friends and Family: More than three times a week  ? Frequency of Social Gatherings with Friends and Family: Twice a week  ? Attends Religious Services: More than 4 times per year  ? Active Member of Clubs or Organizations: Yes  ? Attends Archivist Meetings: More than 4 times per year  ? Marital Status: Married  ? ?Allergies  ?Allergen Reactions  ? Niacin Er Other (See Comments)  ? Statins Other (See Comments)  ? Atorvastatin Other (See Comments)  ?  Muscle weakness  ? Crestor [Rosuvastatin] Other (See Comments)  ?  Muscle weakness  ? Demerol [Meperidine] Nausea Only  ? Niacin And Related Itching, Rash and Other (See Comments)  ?  Palms turned red and itched  ? Repatha [Evolocumab] Other (See Comments)  ?  Muscle weakness  ? ?Family History  ?  Problem Relation Age of Onset  ? Coronary artery disease Other   ?     male first degree relative <60  ? Heart disease Father   ?     heart attack  ? COPD Mother   ? Heart disease Paternal Uncle   ? Colon cancer Neg Hx   ? ? ?Current Outpatient Medications (Endocrine & Metabolic):  ?  dapagliflozin propanediol (FARXIGA) 10 MG TABS tablet, Take 1 tablet (10 mg total) by mouth daily before breakfast. ?  levothyroxine (SYNTHROID) 50 MCG tablet, AS DIRECTED ORALLY ONCE A DAY X6 DAYX AND SKIP ON SUNDAYS 90 DAYS ? ?Current Outpatient Medications (Cardiovascular):  ?  PRALUENT 75 MG/ML SOAJ, INJECT 1 ML INTO THE SKIN EVERY 14 DAYS ?  sacubitril-valsartan (ENTRESTO) 24-26 MG, Take 1 tablet by mouth 2 (two) times daily. ? ?Current Outpatient Medications (Respiratory):  ?  budesonide-formoterol (SYMBICORT) 80-4.5 MCG/ACT inhaler, Inhale 2 puffs into the lungs daily. ?  montelukast (SINGULAIR) 10 MG tablet, Take 1 tablet (10 mg total) by mouth at bedtime. ? ?Current Outpatient Medications (Analgesics):  ?  aspirin EC 81 MG tablet, Take 1 tablet (81 mg total) by mouth daily. ? ?Current Outpatient Medications  (Hematological):  ?  Ferrous Sulfate (IRON) 325 (65 Fe) MG TABS, Take 1 tablet by mouth. Taking three times a week ? ?Current Outpatient Medications (Other):  ?  ALPRAZolam (XANAX) 0.5 MG tablet, Take 0.5 mg b

## 2022-01-31 ENCOUNTER — Encounter: Payer: Self-pay | Admitting: Family Medicine

## 2022-01-31 ENCOUNTER — Ambulatory Visit (INDEPENDENT_AMBULATORY_CARE_PROVIDER_SITE_OTHER): Payer: Medicare Other | Admitting: Family Medicine

## 2022-01-31 ENCOUNTER — Ambulatory Visit: Payer: Medicare Other

## 2022-01-31 VITALS — BP 106/66 | HR 51 | Ht 73.0 in | Wt 183.0 lb

## 2022-01-31 DIAGNOSIS — M19019 Primary osteoarthritis, unspecified shoulder: Secondary | ICD-10-CM | POA: Insufficient documentation

## 2022-01-31 DIAGNOSIS — M75111 Incomplete rotator cuff tear or rupture of right shoulder, not specified as traumatic: Secondary | ICD-10-CM | POA: Diagnosis not present

## 2022-01-31 DIAGNOSIS — M25511 Pain in right shoulder: Secondary | ICD-10-CM | POA: Diagnosis not present

## 2022-01-31 DIAGNOSIS — M19011 Primary osteoarthritis, right shoulder: Secondary | ICD-10-CM | POA: Diagnosis not present

## 2022-01-31 DIAGNOSIS — I255 Ischemic cardiomyopathy: Secondary | ICD-10-CM

## 2022-01-31 NOTE — Assessment & Plan Note (Signed)
Patient has done remarkably well but does have what appears to be an acute tear noted on ultrasound today.  Seems to be a little bit of a different place of the supraspinatus.  Discussed icing regimen and home exercises otherwise.  Discussed posture and ergonomics.  Patient will keep hands within peripheral vision.  Patient will follow-up with me again in 6 to 8 weeks otherwise ?

## 2022-01-31 NOTE — Patient Instructions (Addendum)
Shoulder injections today ?Keep hands in peripheral vision ?Do not do lifting ?See me in 6-8 weeks ?

## 2022-01-31 NOTE — Assessment & Plan Note (Signed)
Injection given today and tolerated the procedure well, discussed posture ergonomics, patient will see how he responds.  We will concern over the severity of pain patient is having if worsening pain do need to consider the possibility of advanced imaging but I do think patient will do well.  Follow-up again in 6 to 8 weeks ?

## 2022-02-10 ENCOUNTER — Other Ambulatory Visit: Payer: Self-pay | Admitting: Nurse Practitioner

## 2022-02-10 DIAGNOSIS — J4520 Mild intermittent asthma, uncomplicated: Secondary | ICD-10-CM

## 2022-02-19 ENCOUNTER — Ambulatory Visit: Payer: Medicare Other | Admitting: Cardiovascular Disease

## 2022-04-02 ENCOUNTER — Ambulatory Visit: Payer: Medicare Other | Admitting: Family Medicine

## 2022-04-05 NOTE — Progress Notes (Signed)
[facility] Phone: [phone] Subjective:    I'm seeing this patient by the request  of:  Plotnikov, Evie Lacks, MD  CC: Right shoulder pain follow-up  [id number]  01/31/2022 Injection given today and tolerated the procedure well, discussed posture ergonomics, patient will see how he responds.  We will concern over the severity of pain patient is having if worsening pain do need to consider the possibility of advanced imaging but I do think patient will do well.  Follow-up again in 6 to 8 weeks  Patient has done remarkably well but does have what appears to be an acute tear noted on ultrasound today.  Seems to be a little bit of a different place of the supraspinatus.  Discussed icing regimen and home exercises otherwise.  Discussed posture and ergonomics.  Patient will keep hands within peripheral vision.  Patient will follow-up with me again in 6 to 8 weeks otherwise  Update 04/10/2022 ROGERIO BOUTELLE is a 76 y.o. male coming in with complaint of R shoulder pain. Patient states that his shoulder pain is slowly improving. He remains careful when doing chest and military presses and is avoiding other activities that increase his pain. Voltaren gel has been helpful.       Past Medical History:  Diagnosis Date   Allergy    rhinitis   Anemia    "Hgb always on the low side" (07/02/2013)   CAD (coronary artery disease)    a. LHC 2/17: pLAD 50, mLAD 95, pRCA 30, dRCA 25, EF 50-55% >> PCI:  3 x 18 mm Resolute DES to mLAD    Chronic bronchitis ([place])    "not in the last year since I started taking allergy shots" (10/09/2016)   Chronic lower back pain    Chronic sinus bradycardia 07/02/2013   Coronary artery disease 10/15/2016   Cryptogenic stroke ([place]) 07/02/2013   a. "small ischemic occipital right sided" (07/02/2013)  //  b. Event monitor 10/14: sinus brady  //  c. Carotid US 10/14: bilat ICA 1-39%   Esophageal stricture     Exertional angina ([place]) 10/09/2016   Heart murmur    "as a child" (07/02/2013)   History of echocardiogram    a. Echo 10/14: EF 60-65%, no RWMA, normal diastolic function   Hyperlipidemia    Hypothyroidism    Nonallopathic lesion of lumbosacral region 06/04/2017   Nonallopathic lesion of sacral region 06/04/2017   Nonallopathic lesion of thoracic region 06/04/2017   Osteoarthritis    "lower back" (10/09/2016)   PFO (patent foramen ovale)    a. TEE 11/14: mild LVH, EF 60-65%, small PFO, R-L shunt   Past Surgical History:  Procedure Laterality Date   CARDIAC CATHETERIZATION N/A 11/10/2015   Procedure: Left Heart Cath and Coronary Angiography;  Surgeon: [name], MD;  Location: [place] [facility];  Service: Cardiovascular;  Laterality: N/A;   CARDIAC CATHETERIZATION N/A 11/10/2015   Procedure: Coronary Stent Intervention;  Surgeon: [name], MD;  Location: [place] [facility];  Service: Cardiovascular;  Laterality: N/A;  mid lad  3.0x18 resolute   CARDIAC CATHETERIZATION  10/09/2016   "scheduled OHS for tomorrow" (10/09/2016)   CARDIAC CATHETERIZATION N/A 10/09/2016   Procedure: Left Heart Cath and Coronary Angiography;  Surgeon: [name], MD;  Location: [place] [facility];  Service: Cardiovascular;  Laterality: N/A;   CARDIAC CATHETERIZATION N/A 10/09/2016   Procedure: Coronary Balloon Angioplasty;  Surgeon: [name], MD;  Location:  [place] [facility];  Service: Cardiovascular;  Laterality: N/A;   CORONARY ARTERY BYPASS GRAFT N/A 10/15/2016   Procedure: CORONARY ARTERY BYPASS GRAFTING (CABG), ON PUMP, TIMES TWO, USING LEFT INTERNAL MAMMARY ARTERY AND RIGHT GREATER SAPHENOUS VEIN HARVESTED ENDOSCOPICALLY;  Surgeon: Ivin Poot, MD;  Location: [place];  Service: Open Heart Surgery;  Laterality: N/A;  LIMA to LAD, SVG to DIAGONAL   ESOPHAGOGASTRODUODENOSCOPY N/A 10/26/2012   Procedure: ESOPHAGOGASTRODUODENOSCOPY (EGD);  Surgeon: Winfield Cunas., MD;  Location: [facility]  ENDOSCOPY;  Service: Endoscopy;  Laterality: N/A;   ESOPHAGOGASTRODUODENOSCOPY N/A 07/08/2013   Procedure: ESOPHAGOGASTRODUODENOSCOPY (EGD);  Surgeon: Winfield Cunas., MD;  Location: [facility];  Service: Endoscopy;  Laterality: N/A;   INNER EAR SURGERY Left 1957   "related to ear infection"   REPAIR OF PATENT FORAMEN OVALE N/A 10/15/2016   Procedure: REPAIR OF PATENT FORAMEN OVALE;  Surgeon: Ivin Poot, MD;  Location: [place];  Service: Open Heart Surgery;  Laterality: N/A;   SAVORY DILATION N/A 07/08/2013   Procedure: SAVORY DILATION;  Surgeon: Winfield Cunas., MD;  Location: [facility];  Service: Endoscopy;  Laterality: N/A;   TEE WITHOUT CARDIOVERSION N/A 07/28/2013   Procedure: TRANSESOPHAGEAL ECHOCARDIOGRAM (TEE);  Surgeon: Pixie Casino, MD;  Location: [facility] ENDOSCOPY;  Service: Cardiovascular;  Laterality: N/A;   TEE WITHOUT CARDIOVERSION N/A 10/15/2016   Procedure: TRANSESOPHAGEAL ECHOCARDIOGRAM (TEE);  Surgeon: Ivin Poot, MD;  Location: [place];  Service: Open Heart Surgery;  Laterality: N/A;   TONSILLECTOMY  1950's   Social History   Socioeconomic History   Marital status: Married    Spouse name: Not on file   Number of children: 1   Years of education: Not on file   Highest education level: Not on file  Occupational History   Occupation: Retired  Tobacco Use   Smoking status: Never   Smokeless tobacco: Never  Vaping Use   Vaping Use: Never used  Substance and Sexual Activity   Alcohol use: Yes    Alcohol/week: 8.0 standard drinks of alcohol    Types: 4 Glasses of wine, 4 Cans of beer per week    Comment: weekly   Drug use: No   Sexual activity: Yes  Other Topics Concern   Not on file  Social History Narrative   Not on file   Social Determinants of Health   Financial Resource Strain: Low Risk  (04/06/2022)   Overall Financial Resource Strain (CARDIA)    Difficulty of Paying Living Expenses: Not hard at all  Food Insecurity: No Food Insecurity  (04/06/2022)   Hunger Vital Sign    Worried About Running Out of Food in the Last Year: Never true    Ran Out of Food in the Last Year: Never true  Transportation Needs: No Transportation Needs (04/06/2022)   PRAPARE - Hydrologist (Medical): No    Lack of Transportation (Non-Medical): No  Physical Activity: Sufficiently Active (04/06/2022)   Exercise Vital Sign    Days of Exercise per Week: 5 days    Minutes of Exercise per Session: 40 min  Stress: No Stress Concern Present (04/06/2022)   [place]    Feeling of Stress : Not at all  Social Connections: [place] (04/06/2022)   Social Connection and Isolation Panel [NHANES]    Frequency of Communication with Friends and Family: Three times a week    Frequency of Social Gatherings with Friends  and Family: Three times a week    Attends Religious Services: More than 4 times per year    Active Member of Clubs or Organizations: Yes    Attends Music therapist: More than 4 times per year    Marital Status: Married   Allergies  Allergen Reactions   Niacin Er Other (See Comments)   Statins Other (See Comments)   Atorvastatin Other (See Comments)    Muscle weakness   Crestor [Rosuvastatin] Other (See Comments)    Muscle weakness   Demerol [Meperidine] Nausea Only   Niacin And Related Itching, Rash and Other (See Comments)    Palms turned red and itched   Repatha [Evolocumab] Other (See Comments)    Muscle weakness   Family History  Problem Relation Age of Onset   Coronary artery disease Other        male first degree relative <60   Heart disease Father        heart attack   COPD Mother    Heart disease Paternal Uncle    Colon cancer Neg Hx     Current Outpatient Medications (Endocrine & Metabolic):    dapagliflozin propanediol (FARXIGA) 10 MG TABS tablet, Take 1 tablet (10 mg total) by mouth daily before  breakfast.   levothyroxine (SYNTHROID) 50 MCG tablet, AS DIRECTED ORALLY ONCE A DAY X6 DAYX AND SKIP ON SUNDAYS 90 DAYS  Current Outpatient Medications (Cardiovascular):    PRALUENT 75 MG/ML SOAJ, INJECT 1 ML INTO THE SKIN EVERY 14 DAYS   sacubitril-valsartan (ENTRESTO) 24-26 MG, Take 1 tablet by mouth 2 (two) times daily.  Current Outpatient Medications (Respiratory):    budesonide-formoterol (SYMBICORT) 80-4.5 MCG/ACT inhaler, Inhale 2 puffs into the lungs daily.   montelukast (SINGULAIR) 10 MG tablet, TAKE 1 TABLET BY MOUTH EVERYDAY AT BEDTIME  Current Outpatient Medications (Analgesics):    aspirin EC 81 MG tablet, Take 1 tablet (81 mg total) by mouth daily.  Current Outpatient Medications (Hematological):    Ferrous Sulfate (IRON) 325 (65 Fe) MG TABS, Take 1 tablet by mouth. Taking three times a week  Current Outpatient Medications (Other):    ALPRAZolam (XANAX) 0.5 MG tablet, Take 0.5 mg by mouth as needed for anxiety (for flying).   Cholecalciferol 2000 UNITS CAPS, Take 2,000 Units by mouth every evening.   Diclofenac Sodium 1.5 % SOLN, Apply 1-2 Pump topically 2 (two) times daily as needed.   Turmeric 500 MG CAPS, Take 1 capsule by mouth at bedtime.    Objective  Blood pressure 108/60, pulse (!) 56, height '6\' 1"'$  (1.854 m), weight 176 lb (79.8 kg), SpO2 97 %.   General: No apparent distress alert and oriented x3 mood and affect normal, dressed appropriately.  HEENT: Pupils equal, extraocular movements intact  Respiratory: Patient's speak in full sentences and does not appear short of breath  Cardiovascular: No lower extremity edema, non tender, no erythema  Right shoulder exam does have relatively good range of motion but does have positive impingement with Neer and Hawkins.  4 out of 5 strength with empty can.  Still not significantly improved from previous exam.  Limited muscular skeletal ultrasound was performed and interpreted by Hulan Saas, M   Limited ultrasound  shows the patient does have mild hypoechoic changes noted. Patient does have what appears to be an intrasubstance tearing noted of the supraspinatus.  No significant retraction noted.  No cortical irregularity noted. Impression: Intersubstance tearing noted of the supraspinatus.    Impression and Recommendations:  The above documentation has been reviewed and is accurate and complete Lyndal Pulley, DO

## 2022-04-06 ENCOUNTER — Ambulatory Visit (INDEPENDENT_AMBULATORY_CARE_PROVIDER_SITE_OTHER): Payer: Medicare Other

## 2022-04-06 DIAGNOSIS — Z0000 Encounter for general adult medical examination without abnormal findings: Secondary | ICD-10-CM

## 2022-04-06 NOTE — Patient Instructions (Signed)
[name] , Thank you for taking time to come for your Medicare Wellness Visit. I appreciate your ongoing commitment to your health goals. Please review the following plan we discussed and let me know if I can assist you in the future.   Screening recommendations/referrals: Colonoscopy: no longer required  Recommended yearly ophthalmology/optometry visit for glaucoma screening and checkup Recommended yearly dental visit for hygiene and checkup  Vaccinations: Influenza vaccine: completed  Pneumococcal vaccine: completed  Tdap vaccine: due  Shingles vaccine: will consider     Advanced directives: yes   Conditions/risks identified: none   Next appointment: none   Preventive Care 76 Years and Older, Male Preventive care refers to lifestyle choices and visits with your health care provider that can promote health and wellness. What does preventive care include? A yearly physical exam. This is also called an annual well check. Dental exams once or twice a year. Routine eye exams. Ask your health care provider how often you should have your eyes checked. Personal lifestyle choices, including: Daily care of your teeth and gums. Regular physical activity. Eating a healthy diet. Avoiding tobacco and drug use. Limiting alcohol use. Practicing safe sex. Taking low doses of aspirin every day. Taking vitamin and mineral supplements as recommended by your health care provider. What happens during an annual well check? The services and screenings done by your health care provider during your annual well check will depend on your age, overall health, lifestyle risk factors, and family history of disease. Counseling  Your health care provider may ask you questions about your: Alcohol use. Tobacco use. Drug use. Emotional well-being. Home and relationship well-being. Sexual activity. Eating habits. History of falls. Memory and ability to understand (cognition). Work and work  Statistician. Screening  You may have the following tests or measurements: Height, weight, and BMI. Blood pressure. Lipid and cholesterol levels. These may be checked every 5 years, or more frequently if you are over 34 years old. Skin check. Lung cancer screening. You may have this screening every year starting at age 76 if you have a 30-pack-year history of smoking and currently smoke or have quit within the past 15 years. Fecal occult blood test (FOBT) of the stool. You may have this test every year starting at age 76. Flexible sigmoidoscopy or colonoscopy. You may have a sigmoidoscopy every 5 years or a colonoscopy every 10 years starting at age 76. Prostate cancer screening. Recommendations will vary depending on your family history and other risks. Hepatitis C blood test. Hepatitis B blood test. Sexually transmitted disease (STD) testing. Diabetes screening. This is done by checking your blood sugar (glucose) after you have not eaten for a while (fasting). You may have this done every 1-3 years. Abdominal aortic aneurysm (AAA) screening. You may need this if you are a current or former smoker. Osteoporosis. You may be screened starting at age 76 if you are at high risk. Talk with your health care provider about your test results, treatment options, and if necessary, the need for more tests. Vaccines  Your health care provider may recommend certain vaccines, such as: Influenza vaccine. This is recommended every year. Tetanus, diphtheria, and acellular pertussis (Tdap, Td) vaccine. You may need a Td booster every 10 years. Zoster vaccine. You may need this after age 76. Pneumococcal 13-valent conjugate (PCV13) vaccine. One dose is recommended after age 76. Pneumococcal polysaccharide (PPSV23) vaccine. One dose is recommended after age 76. Talk to your health care provider about which screenings and vaccines you need  and how often you need them. This information is not intended to replace  advice given to you by your health care provider. Make sure you discuss any questions you have with your health care provider. Document Released: 09/30/2015 Document Revised: 05/23/2016 Document Reviewed: 07/05/2015 Elsevier Interactive Patient Education  2017 [place] Prevention in the Home Falls can cause injuries. They can happen to people of all ages. There are many things you can do to make your home safe and to help prevent falls. What can I do on the outside of my home? Regularly fix the edges of walkways and driveways and fix any cracks. Remove anything that might make you trip as you walk through a door, such as a raised step or threshold. Trim any bushes or trees on the path to your home. Use bright outdoor lighting. Clear any walking paths of anything that might make someone trip, such as rocks or tools. Regularly check to see if handrails are loose or broken. Make sure that both sides of any steps have handrails. Any raised decks and porches should have guardrails on the edges. Have any leaves, snow, or ice cleared regularly. Use sand or salt on walking paths during winter. Clean up any spills in your garage right away. This includes oil or grease spills. What can I do in the bathroom? Use night lights. Install grab bars by the toilet and in the tub and shower. Do not use towel bars as grab bars. Use non-skid mats or decals in the tub or shower. If you need to sit down in the shower, use a plastic, non-slip stool. Keep the floor dry. Clean up any water that spills on the floor as soon as it happens. Remove soap buildup in the tub or shower regularly. Attach bath mats securely with double-sided non-slip rug tape. Do not have throw rugs and other things on the floor that can make you trip. What can I do in the bedroom? Use night lights. Make sure that you have a light by your bed that is easy to reach. Do not use any sheets or blankets that are too big for your bed.  They should not hang down onto the floor. Have a firm chair that has side arms. You can use this for support while you get dressed. Do not have throw rugs and other things on the floor that can make you trip. What can I do in the kitchen? Clean up any spills right away. Avoid walking on wet floors. Keep items that you use a lot in easy-to-reach places. If you need to reach something above you, use a strong step stool that has a grab bar. Keep electrical cords out of the way. Do not use floor polish or wax that makes floors slippery. If you must use wax, use non-skid floor wax. Do not have throw rugs and other things on the floor that can make you trip. What can I do with my stairs? Do not leave any items on the stairs. Make sure that there are handrails on both sides of the stairs and use them. Fix handrails that are broken or loose. Make sure that handrails are as long as the stairways. Check any carpeting to make sure that it is firmly attached to the stairs. Fix any carpet that is loose or worn. Avoid having throw rugs at the top or bottom of the stairs. If you do have throw rugs, attach them to the floor with carpet tape. Make sure that you  have a light switch at the top of the stairs and the bottom of the stairs. If you do not have them, ask someone to add them for you. What else can I do to help prevent falls? Wear shoes that: Do not have high heels. Have rubber bottoms. Are comfortable and fit you well. Are closed at the toe. Do not wear sandals. If you use a stepladder: Make sure that it is fully opened. Do not climb a closed stepladder. Make sure that both sides of the stepladder are locked into place. Ask someone to hold it for you, if possible. Clearly mark and make sure that you can see: Any grab bars or handrails. First and last steps. Where the edge of each step is. Use tools that help you move around (mobility aids) if they are needed. These  include: Canes. Walkers. Scooters. Crutches. Turn on the lights when you go into a dark area. Replace any light bulbs as soon as they burn out. Set up your furniture so you have a clear path. Avoid moving your furniture around. If any of your floors are uneven, fix them. If there are any pets around you, be aware of where they are. Review your medicines with your doctor. Some medicines can make you feel dizzy. This can increase your chance of falling. Ask your doctor what other things that you can do to help prevent falls. This information is not intended to replace advice given to you by your health care provider. Make sure you discuss any questions you have with your health care provider. Document Released: 06/30/2009 Document Revised: 02/09/2016 Document Reviewed: 10/08/2014 Elsevier Interactive Patient Education  2017 [employer].

## 2022-04-06 NOTE — Progress Notes (Addendum)
Subjective:   Richard Ellis is a 76 y.o. male who presents for an subsequent  Medicare Annual Wellness Visit.  Review of Systems     Cardiac Risk Factors include: advanced age (>29mn, >>107women);male genderI connected with CDuane Ellis today by telephone and verified that I am speaking with the correct person using two identifiers. Location patient: home Location provider: work Persons participating in the virtual visit: patient, provider.   I discussed the limitations, risks, security and privacy concerns of performing an evaluation and management service by telephone and the availability of in person appointments. I also discussed with the patient that there may be a patient responsible charge related to this service. The patient expressed understanding and verbally consented to this telephonic visit.    Interactive audio and video telecommunications were attempted between this provider and patient, however failed, due to patient having technical difficulties OR patient did not have access to video capability.  We continued and completed visit with audio only.        Objective:    Today's Vitals   There is no height or weight on file to calculate BMI.     04/06/2022    3:34 PM 08/21/2021    8:29 PM 03/30/2021    9:56 AM 10/11/2016    8:19 AM 10/09/2016    1:05 PM 11/10/2015   10:33 AM 07/28/2013    8:00 AM  Advanced Directives  Does Patient Have a Medical Advance Directive? Yes Yes Yes Yes Yes Yes Patient has advance directive, copy not in chart  Type of Advance Directive HCashtonLiving will Living will;Healthcare Power of Attorney Living will;Healthcare Power of ABucknerLiving will Healthcare Power of ASeymourLiving will  Does patient want to make changes to medical advance directive?   No - Patient declined  No - Patient declined    Copy of HRaglandin Chart? No - copy requested   No - copy requested  No - copy requested No - copy requested     Current Medications (verified) Outpatient Encounter Medications as of 04/06/2022  Medication Sig   ALPRAZolam (XANAX) 0.5 MG tablet Take 0.5 mg by mouth as needed for anxiety (for flying).   aspirin EC 81 MG tablet Take 1 tablet (81 mg total) by mouth daily.   budesonide-formoterol (SYMBICORT) 80-4.5 MCG/ACT inhaler Inhale 2 puffs into the lungs daily.   Cholecalciferol 2000 UNITS CAPS Take 2,000 Units by mouth every evening.   dapagliflozin propanediol (FARXIGA) 10 MG TABS tablet Take 1 tablet (10 mg total) by mouth daily before breakfast.   Diclofenac Sodium 1.5 % SOLN Apply 1-2 Pump topically 2 (two) times daily as needed.   Ferrous Sulfate (IRON) 325 (65 Fe) MG TABS Take 1 tablet by mouth. Taking three times a week   levothyroxine (SYNTHROID) 50 MCG tablet AS DIRECTED ORALLY ONCE A DAY X6 DAYX AND SKIP ON SUNDAYS 90 DAYS   montelukast (SINGULAIR) 10 MG tablet TAKE 1 TABLET BY MOUTH EVERYDAY AT BEDTIME   PRALUENT 75 MG/ML SOAJ INJECT 1 ML INTO THE SKIN EVERY 14 DAYS   sacubitril-valsartan (ENTRESTO) 24-26 MG Take 1 tablet by mouth 2 (two) times daily.   Turmeric 500 MG CAPS Take 1 capsule by mouth at bedtime.   No facility-administered encounter medications on file as of 04/06/2022.    Allergies (verified) Niacin er, Statins, Atorvastatin, Crestor [rosuvastatin], Demerol [meperidine], Niacin and related, and Repatha [evolocumab]  History: Past Medical History:  Diagnosis Date   Allergy    rhinitis   Anemia    "Hgb always on the low side" (07/02/2013)   CAD (coronary artery disease)    a. LHC 2/17: pLAD 50, mLAD 95, pRCA 30, dRCA 25, EF 50-55% >> PCI:  3 x 18 mm Resolute DES to mLAD    Chronic bronchitis ([place])    "not in the last year since I started taking allergy shots" (10/09/2016)   Chronic lower back pain    Chronic sinus bradycardia 07/02/2013   Coronary artery disease 10/15/2016   Cryptogenic stroke ([place])  07/02/2013   a. "small ischemic occipital right sided" (07/02/2013)  //  b. Event monitor 10/14: sinus brady  //  c. Carotid US 10/14: bilat ICA 1-39%   Esophageal stricture    Exertional angina ([place]) 10/09/2016   Heart murmur    "as a child" (07/02/2013)   History of echocardiogram    a. Echo 10/14: EF 60-65%, no RWMA, normal diastolic function   Hyperlipidemia    Hypothyroidism    Nonallopathic lesion of lumbosacral region 06/04/2017   Nonallopathic lesion of sacral region 06/04/2017   Nonallopathic lesion of thoracic region 06/04/2017   Osteoarthritis    "lower back" (10/09/2016)   PFO (patent foramen ovale)    a. TEE 11/14: mild LVH, EF 60-65%, small PFO, R-L shunt   Past Surgical History:  Procedure Laterality Date   CARDIAC CATHETERIZATION N/A 11/10/2015   Procedure: Left Heart Cath and Coronary Angiography;  Surgeon: [name], MD;  Location: [place] [facility];  Service: Cardiovascular;  Laterality: N/A;   CARDIAC CATHETERIZATION N/A 11/10/2015   Procedure: Coronary Stent Intervention;  Surgeon: [name], MD;  Location: [place] [facility];  Service: Cardiovascular;  Laterality: N/A;  mid lad  3.0x18 resolute   CARDIAC CATHETERIZATION  10/09/2016   "scheduled OHS for tomorrow" (10/09/2016)   CARDIAC CATHETERIZATION N/A 10/09/2016   Procedure: Left Heart Cath and Coronary Angiography;  Surgeon: [name], MD;  Location: [place] [facility];  Service: Cardiovascular;  Laterality: N/A;   CARDIAC CATHETERIZATION N/A 10/09/2016   Procedure: Coronary Balloon Angioplasty;  Surgeon: [name], MD;  Location: [place] [facility];  Service: Cardiovascular;  Laterality: N/A;   CORONARY ARTERY BYPASS GRAFT N/A 10/15/2016   Procedure: CORONARY ARTERY BYPASS GRAFTING (CABG), ON PUMP, TIMES TWO, USING LEFT INTERNAL MAMMARY ARTERY AND RIGHT GREATER SAPHENOUS VEIN HARVESTED ENDOSCOPICALLY;  Surgeon: Ivin Poot, MD;  Location: [place];  Service: Open Heart Surgery;  Laterality: N/A;   LIMA to LAD, SVG to DIAGONAL   ESOPHAGOGASTRODUODENOSCOPY N/A 10/26/2012   Procedure: ESOPHAGOGASTRODUODENOSCOPY (EGD);  Surgeon: Winfield Cunas., MD;  [facility];  Service: Endoscopy;  Laterality: N/A;   ESOPHAGOGASTRODUODENOSCOPY N/A 07/08/2013   Procedure: ESOPHAGOGASTRODUODENOSCOPY (EGD);  Surgeon: Winfield Cunas., MD;  Location: [facility];  Service: Endoscopy;  Laterality: N/A;   INNER EAR SURGERY Left 1957   "related to ear infection"   REPAIR OF PATENT FORAMEN OVALE N/A 10/15/2016   Procedure: REPAIR OF PATENT FORAMEN OVALE;  Surgeon: Ivin Poot, MD;  Location: [place];  Service: Open Heart Surgery;  Laterality: N/A;   SAVORY DILATION N/A 07/08/2013   Procedure: SAVORY DILATION;  Surgeon: Winfield Cunas., MD;  Location: [facility];  Service: Endoscopy;  Laterality: N/A;   TEE WITHOUT CARDIOVERSION N/A 07/28/2013   Procedure: TRANSESOPHAGEAL ECHOCARDIOGRAM (TEE);  Surgeon: Pixie Casino, MD;  Location: [place];  Service: Cardiovascular;  Laterality: N/A;   TEE WITHOUT CARDIOVERSION N/A 10/15/2016   Procedure: TRANSESOPHAGEAL ECHOCARDIOGRAM (TEE);  Surgeon: Ivin Poot, MD;  Location: [place];  Service: Open Heart Surgery;  Laterality: N/A;   TONSILLECTOMY  1950's   Family History  Problem Relation Age of Onset   Coronary artery disease Other        male first degree relative <60   Heart disease Father        heart attack   COPD Mother    Heart disease Paternal Uncle    Colon cancer Neg Hx    Social History   Socioeconomic History   Marital status: Married    Spouse name: Not on file   Number of children: 1   Years of education: Not on file   Highest education level: Not on file  Occupational History   Occupation: Retired  Tobacco Use   Smoking status: Never   Smokeless tobacco: Never  Vaping Use   Vaping Use: Never used  Substance and Sexual Activity   Alcohol use: Yes    Alcohol/week: 8.0 standard drinks of alcohol    Types:  4 Glasses of wine, 4 Cans of beer per week    Comment: weekly   Drug use: No   Sexual activity: Yes  Other Topics Concern   Not on file  Social History Narrative   Not on file   Social Determinants of Health   Financial Resource Strain: Low Risk  (04/06/2022)   Overall Financial Resource Strain (CARDIA)    Difficulty of Paying Living Expenses: Not hard at all  Food Insecurity: No Food Insecurity (04/06/2022)   Hunger Vital Sign    Worried About Running Out of Food in the Last Year: Never true    Ran Out of Food in the Last Year: Never true  Transportation Needs: No Transportation Needs (04/06/2022)   PRAPARE - Hydrologist (Medical): No    Lack of Transportation (Non-Medical): No  Physical Activity: Sufficiently Active (04/06/2022)   Exercise Vital Sign    Days of Exercise per Week: 5 days    Minutes of Exercise per Session: 40 min  Stress: No Stress Concern Present (04/06/2022)   [place]    Feeling of Stress : Not at all  Social Connections: [place] (04/06/2022)   Social Connection and Isolation Panel [NHANES]    Frequency of Communication with Friends and Family: Three times a week    Frequency of Social Gatherings with Friends and Family: Three times a week    Attends Religious Services: More than 4 times per year    Active Member of Clubs or Organizations: Yes    Attends Music therapist: More than 4 times per year    Marital Status: Married    Tobacco Counseling Counseling given: Not Answered   Clinical Intake:  Pre-visit preparation completed: Yes  Pain : No/denies pain     Nutritional Risks: None Diabetes: No  How often do you need to have someone help you when you read instructions, pamphlets, or other written materials from your doctor or pharmacy?: 1 - Never What is the last grade level you completed in school?: masters  Diabetic?no    Interpreter Needed?: No  Information entered by :: L.Wilson,LPN   Activities of Daily Living    04/06/2022    3:37 PM  In your present state of health, do you have any difficulty performing the following activities:  Hearing? 0  Vision? 0  Difficulty concentrating or making decisions? 0  Walking or climbing stairs? 0  Dressing or bathing? 0  Doing errands, shopping? 0  Preparing Food and eating ? N  Using the Toilet? N  In the past six months, have you accidently leaked urine? N  Do you have problems with loss of bowel control? N  Managing your Medications? N  Managing your Finances? N  Housekeeping or managing your Housekeeping? N    Patient Care Team: Plotnikov, Evie Lacks, MD as PCP - Cyndia Diver, MD as PCP - Cardiology (Cardiology) Rana Snare, MD (Inactive) as Consulting Physician (Urology) Laurence Aly, OD as Consulting Physician (Optometry)  Indicate any recent Medical Services you may have received from other than [facility] providers in the past year (date may be approximate).     Assessment:   This is a routine wellness examination for Jarian.  Hearing/Vision screen Vision Screening - Comments:: Annual eye exams wear glasses   Dietary issues and exercise activities discussed: Current Exercise Habits: Home exercise routine, Time (Minutes): 40, Frequency (Times/Week): 5, Weekly Exercise (Minutes/Week): 200, Intensity: Mild, Exercise limited by: None identified   Goals Addressed   None    Depression Screen    04/06/2022    3:37 PM 04/06/2022    3:34 PM 04/06/2022    3:33 PM 11/14/2021    1:40 PM 03/30/2021   10:12 AM 01/12/2021    2:17 PM 03/04/2017    9:42 AM  PHQ 2/9 Scores  PHQ - 2 Score 0 0 0 0 0 0 0  PHQ- 9 Score      0     Fall Risk    04/06/2022    3:34 PM 11/14/2021    1:40 PM 03/30/2021   10:15 AM 01/12/2021    2:18 PM 11/29/2016    8:32 AM  Fall Risk   Falls in the past year? 0 0 0 0 No  Number falls in past yr: 0 0 0 0   Injury  with Fall? 0 0 0 0   Risk for fall due to :   No Fall Risks No Fall Risks   Follow up Falls evaluation completed;Education provided  Falls evaluation completed      FALL RISK PREVENTION PERTAINING TO THE HOME:  Any stairs in or around the home? Yes  If so, are there any without handrails? No  Home free of loose throw rugs in walkways, pet beds, electrical cords, etc? Yes  Adequate lighting in your home to reduce risk of falls? Yes   ASSISTIVE DEVICES UTILIZED TO PREVENT FALLS:  Life alert? No  Use of a cane, walker or w/c? No  Grab bars in the bathroom? No  Shower chair or bench in shower? No  Elevated toilet seat or a handicapped toilet? No     Cognitive Function:  Normal cognitive status assessed by telephone conversation by this Nurse Health Advisor. No abnormalities found.        Immunizations Immunization History  Administered Date(s) Administered   DTaP 02/03/2011   Fluad Quad(high Dose 65+) 05/17/2020   Influenza, High Dose Seasonal PF 07/20/2016, 05/29/2017, 05/29/2018, 05/28/2019, 06/17/2021   Influenza, Quadrivalent, Recombinant, Inj, Pf 05/24/2020   Influenza-Unspecified 06/23/2015   PFIZER(Purple Top)SARS-COV-2 Vaccination 10/08/2019, 10/29/2019, 07/06/2020, 04/12/2021   Pneumococcal Conjugate-13 04/19/2016   Pneumococcal Polysaccharide-23 08/11/2012    TDAP status: Due, Education has been provided regarding the importance of this vaccine. Advised may receive this vaccine at local pharmacy or Health Dept.  Aware to provide a copy of the vaccination record if obtained from local pharmacy or Health Dept. Verbalized acceptance and understanding.  Flu Vaccine status: Up to date  Pneumococcal vaccine status: Up to date  Covid-19 vaccine status: Completed vaccines  Qualifies for Shingles Vaccine? Yes   Zostavax completed No   Shingrix Completed?: No.    Education has been provided regarding the importance of this vaccine. Patient has been advised to call  insurance company to determine out of pocket expense if they have not yet received this vaccine. Advised may also receive vaccine at local pharmacy or Health Dept. Verbalized acceptance and understanding.  Screening Tests Health Maintenance  Topic Date Due   Hepatitis C Screening  Never done   TETANUS/TDAP  Never done   Zoster Vaccines- Shingrix (1 of 2) Never done   COVID-19 Vaccine (5 - Booster for Pfizer series) 06/07/2021   INFLUENZA VACCINE  04/17/2022   Pneumonia Vaccine 43+ Years old  Completed   HPV VACCINES  Aged Out   COLONOSCOPY (Pts 45-24yr Insurance coverage will need to be confirmed)  Discontinued    Health Maintenance  Health Maintenance Due  Topic Date Due   Hepatitis C Screening  Never done   TETANUS/TDAP  Never done   Zoster Vaccines- Shingrix (1 of 2) Never done   COVID-19 Vaccine (5 - Booster for Pfizer series) 06/07/2021    Colorectal cancer screening: No longer required.   Lung Cancer Screening: (Low Dose CT Chest recommended if Age 52103-80years, 30 pack-year currently smoking OR have quit w/in 15years.) does not qualify.   Lung Cancer Screening Referral: n/a  Additional Screening:  Hepatitis C Screening: does not qualify;   Vision Screening: Recommended annual ophthalmology exams for early detection of glaucoma and other disorders of the eye. Is the patient up to date with their annual eye exam?  Yes  Who is the provider or what is the name of the office in which the patient attends annual [facility] exams[facility] If pt is not established with a provider, would they like to be referred to a provider to establish care? No .   Dental Screening: Recommended annual dental exams for proper oral hygiene  Community Resource Referral / Chronic Care Management: CRR required this visit?  No   CCM required this visit?  No      Plan:     I have personally reviewed and noted the following in the patient's chart:   Medical and social history Use of  alcohol, tobacco or illicit drugs  Current medications and supplements including opioid prescriptions. Patient is not currently taking opioid prescriptions. Functional ability and status Nutritional status Physical activity Advanced directives List of other physicians Hospitalizations, surgeries, and ER visits in previous 12 months Vitals Screenings to include cognitive, depression, and falls Referrals and appointments  In addition, I have reviewed and discussed with patient certain preventive protocols, quality metrics, and best practice recommendations. A written personalized care plan for preventive services as well as general preventive health recommendations were provided to patient.     LRandel Pigg LPN   [id number]  Nurse Notes: none    Medical screening examination/treatment/procedure(s) were performed by non-physician practitioner and as supervising physician I was immediately available for consultation/collaboration.  I agree with above. ALew Dawes MD

## 2022-04-10 ENCOUNTER — Ambulatory Visit: Payer: Self-pay

## 2022-04-10 ENCOUNTER — Encounter: Payer: Self-pay | Admitting: Family Medicine

## 2022-04-10 ENCOUNTER — Ambulatory Visit (INDEPENDENT_AMBULATORY_CARE_PROVIDER_SITE_OTHER): Payer: Medicare Other | Admitting: Family Medicine

## 2022-04-10 VITALS — BP 108/60 | HR 56 | Ht 73.0 in | Wt 176.0 lb

## 2022-04-10 DIAGNOSIS — M75111 Incomplete rotator cuff tear or rupture of right shoulder, not specified as traumatic: Secondary | ICD-10-CM

## 2022-04-10 DIAGNOSIS — I255 Ischemic cardiomyopathy: Secondary | ICD-10-CM | POA: Diagnosis not present

## 2022-04-10 DIAGNOSIS — M25511 Pain in right shoulder: Secondary | ICD-10-CM | POA: Diagnosis not present

## 2022-04-10 NOTE — Assessment & Plan Note (Signed)
Unfortunately I do believe the patient still has a rotator cuff tear.  Injection did help out with the hypoechoic changes on ultrasound but unfortunately can still see the intersubstance tearing noted.  Discussed which activities to do and which ones to avoid otherwise.  We discussed home exercises.  Follow-up with me again in 6 to 8 weeks.  Did discuss the possibility of PRP.  We discussed the possibility of an MRI which patient declined.

## 2022-04-10 NOTE — Patient Instructions (Signed)
Slow progress but doing better Take a step back on the weights Read about PRP See me in 6 weeks

## 2022-04-18 DIAGNOSIS — L820 Inflamed seborrheic keratosis: Secondary | ICD-10-CM | POA: Diagnosis not present

## 2022-04-18 DIAGNOSIS — D1801 Hemangioma of skin and subcutaneous tissue: Secondary | ICD-10-CM | POA: Diagnosis not present

## 2022-04-18 DIAGNOSIS — D225 Melanocytic nevi of trunk: Secondary | ICD-10-CM | POA: Diagnosis not present

## 2022-04-18 DIAGNOSIS — D2271 Melanocytic nevi of right lower limb, including hip: Secondary | ICD-10-CM | POA: Diagnosis not present

## 2022-04-18 DIAGNOSIS — L821 Other seborrheic keratosis: Secondary | ICD-10-CM | POA: Diagnosis not present

## 2022-04-18 DIAGNOSIS — D2272 Melanocytic nevi of left lower limb, including hip: Secondary | ICD-10-CM | POA: Diagnosis not present

## 2022-04-18 DIAGNOSIS — L812 Freckles: Secondary | ICD-10-CM | POA: Diagnosis not present

## 2022-05-07 DIAGNOSIS — H2513 Age-related nuclear cataract, bilateral: Secondary | ICD-10-CM | POA: Diagnosis not present

## 2022-05-12 ENCOUNTER — Other Ambulatory Visit: Payer: Self-pay | Admitting: Cardiovascular Disease

## 2022-05-14 MED ORDER — DAPAGLIFLOZIN PROPANEDIOL 10 MG PO TABS: 10.0000 mg | ORAL_TABLET | Freq: Every day | ORAL | 9 refills | Status: DC

## 2022-05-15 ENCOUNTER — Other Ambulatory Visit: Payer: Self-pay | Admitting: Cardiovascular Disease

## 2022-05-16 MED ORDER — ENTRESTO 24-26 MG PO TABS: 1.0000 | ORAL_TABLET | Freq: Two times a day (BID) | ORAL | 9 refills | Status: DC

## 2022-05-17 ENCOUNTER — Encounter: Payer: Self-pay | Admitting: Family Medicine

## 2022-05-17 ENCOUNTER — Telehealth: Payer: Self-pay | Admitting: Internal Medicine

## 2022-05-17 NOTE — Telephone Encounter (Signed)
Patient would like  for Dr. Alain Marion to go back to prescribing his levo thyroxine - Please send to [facility] [address], [place]

## 2022-05-28 ENCOUNTER — Telehealth: Payer: Self-pay | Admitting: Adult Health

## 2022-05-28 NOTE — Telephone Encounter (Signed)
LVM and sent mychart msg asking pt to call back to reschedule 9/28 appointment - NP out

## 2022-05-30 ENCOUNTER — Ambulatory Visit: Payer: Medicare Other | Admitting: Internal Medicine

## 2022-05-30 ENCOUNTER — Telehealth: Payer: Self-pay | Admitting: Internal Medicine

## 2022-05-30 NOTE — Telephone Encounter (Signed)
Pt appointment was cancelled by mistake, and he needs a refill of levothyroxine (SYNTHROID) 50 MCG tablet.   Pt has been rescheduled for 9.26.23  Please send a 30 day supply to [facility] [address] [facility]   Phone:  [phone]  Fax:  [phone]

## 2022-05-31 MED ORDER — LEVOTHYROXINE SODIUM 50 MCG PO TABS: ORAL_TABLET | ORAL | 0 refills | Status: DC

## 2022-05-31 NOTE — Telephone Encounter (Signed)
Rx sent to [facility] for 30 day apply.Marland KitchenJohny Ellis

## 2022-06-12 ENCOUNTER — Encounter: Payer: Self-pay | Admitting: Internal Medicine

## 2022-06-12 ENCOUNTER — Ambulatory Visit (INDEPENDENT_AMBULATORY_CARE_PROVIDER_SITE_OTHER): Payer: Medicare Other | Admitting: Internal Medicine

## 2022-06-12 VITALS — BP 112/62 | HR 48 | Temp 97.8°F | Ht 73.0 in | Wt 175.6 lb

## 2022-06-12 DIAGNOSIS — R972 Elevated prostate specific antigen [PSA]: Secondary | ICD-10-CM

## 2022-06-12 DIAGNOSIS — G459 Transient cerebral ischemic attack, unspecified: Secondary | ICD-10-CM

## 2022-06-12 DIAGNOSIS — E039 Hypothyroidism, unspecified: Secondary | ICD-10-CM | POA: Diagnosis not present

## 2022-06-12 DIAGNOSIS — I255 Ischemic cardiomyopathy: Secondary | ICD-10-CM | POA: Diagnosis not present

## 2022-06-12 DIAGNOSIS — E785 Hyperlipidemia, unspecified: Secondary | ICD-10-CM | POA: Diagnosis not present

## 2022-06-12 DIAGNOSIS — Z8673 Personal history of transient ischemic attack (TIA), and cerebral infarction without residual deficits: Secondary | ICD-10-CM

## 2022-06-12 LAB — URINALYSIS
Bilirubin Urine: NEGATIVE
Hgb urine dipstick: NEGATIVE
Ketones, ur: NEGATIVE
Leukocytes,Ua: NEGATIVE
Nitrite: NEGATIVE
Specific Gravity, Urine: 1.01 (ref 1.000–1.030)
Total Protein, Urine: NEGATIVE
Urine Glucose: >=1000 — AB
Urobilinogen, UA: 0.2 (ref 0.0–1.0)
pH: 5.5 (ref 5.0–8.0)

## 2022-06-12 LAB — LIPID PANEL
Cholesterol: 187 mg/dL (ref 0–200)
HDL: 49 mg/dL (ref >39.00)
LDL Cholesterol: 106 mg/dL — ABNORMAL HIGH (ref 0–99)
NonHDL: 137.72
Total CHOL/HDL Ratio: 4
Triglycerides: 158 mg/dL — ABNORMAL HIGH (ref 0.0–149.0)
VLDL: 31.6 mg/dL (ref 0.0–40.0)

## 2022-06-12 LAB — CBC WITH DIFFERENTIAL/PLATELET
Basophils Absolute: 0 10*3/uL (ref 0.0–0.1)
Basophils Relative: 0.7 % (ref 0.0–3.0)
Eosinophils Absolute: 0.1 10*3/uL (ref 0.0–0.7)
Eosinophils Relative: 2.7 % (ref 0.0–5.0)
HCT: 39.6 % (ref 39.0–52.0)
Hemoglobin: 13.3 g/dL (ref 13.0–17.0)
Lymphocytes Relative: 25.8 % (ref 12.0–46.0)
Lymphs Abs: 1.2 10*3/uL (ref 0.7–4.0)
MCHC: 33.6 g/dL (ref 30.0–36.0)
MCV: 95 fl (ref 78.0–100.0)
Monocytes Absolute: 0.5 10*3/uL (ref 0.1–1.0)
Monocytes Relative: 10.9 % (ref 3.0–12.0)
Neutro Abs: 2.8 10*3/uL (ref 1.4–7.7)
Neutrophils Relative %: 59.9 % (ref 43.0–77.0)
Platelets: 188 10*3/uL (ref 150.0–400.0)
RBC: 4.16 Mil/uL — ABNORMAL LOW (ref 4.22–5.81)
RDW: 12.9 % (ref 11.5–15.5)
WBC: 4.7 10*3/uL (ref 4.0–10.5)

## 2022-06-12 LAB — COMPREHENSIVE METABOLIC PANEL
ALT: 15 U/L (ref 0–53)
AST: 18 U/L (ref 0–37)
Albumin: 4.3 g/dL (ref 3.5–5.2)
Alkaline Phosphatase: 47 U/L (ref 39–117)
BUN: 28 mg/dL — ABNORMAL HIGH (ref 6–23)
CO2: 25 mEq/L (ref 19–32)
Calcium: 9.2 mg/dL (ref 8.4–10.5)
Chloride: 105 mEq/L (ref 96–112)
Creatinine, Ser: 1.07 mg/dL (ref 0.40–1.50)
GFR: 67.56 mL/min (ref >60.00)
Glucose, Bld: 90 mg/dL (ref 70–99)
Potassium: 4.3 mEq/L (ref 3.5–5.1)
Sodium: 137 mEq/L (ref 135–145)
Total Bilirubin: 0.5 mg/dL (ref 0.2–1.2)
Total Protein: 6.4 g/dL (ref 6.0–8.3)

## 2022-06-12 LAB — TSH: TSH: 2.21 u[IU]/mL (ref 0.35–5.50)

## 2022-06-12 LAB — PSA: PSA: 3.29 ng/mL (ref 0.10–4.00)

## 2022-06-12 MED ORDER — LEVOTHYROXINE SODIUM 50 MCG PO TABS: ORAL_TABLET | ORAL | 3 refills | Status: DC

## 2022-06-12 NOTE — Assessment & Plan Note (Signed)
On Levothyroxine Monitor TSH

## 2022-06-12 NOTE — Assessment & Plan Note (Signed)
No relapse 

## 2022-06-12 NOTE — Assessment & Plan Note (Signed)
On Repatha 

## 2022-06-12 NOTE — Progress Notes (Signed)
Subjective:  Patient ID: Richard Ellis, male    DOB: 01-Feb-1946  Age: 76 y.o. MRN: [id number]  CC: Follow-up (6 month f/u)   HPI Richard Ellis presents for hypothyroidism, history of elevated PSA, history of CVA Richard Ellis was active biking.  Now he is doing more running after a fall from the bike (seeing sports medicine).   Outpatient Medications Prior to Visit  Medication Sig Dispense Refill   ALPRAZolam (XANAX) 0.5 MG tablet Take 0.5 mg by mouth as needed for anxiety (for flying).     aspirin EC 81 MG tablet Take 1 tablet (81 mg total) by mouth daily. 90 tablet 3   budesonide-formoterol (SYMBICORT) 80-4.5 MCG/ACT inhaler Inhale 2 puffs into the lungs daily. 1 each 6   Cholecalciferol 2000 UNITS CAPS Take 2,000 Units by mouth every evening.     dapagliflozin propanediol (FARXIGA) 10 MG TABS tablet Take 1 tablet (10 mg total) by mouth daily before breakfast. 30 tablet 9   Diclofenac Sodium 1.5 % SOLN Apply 1-2 Pump topically 2 (two) times daily as needed. 300 mL 0   Ferrous Sulfate (IRON) 325 (65 Fe) MG TABS Take 1 tablet by mouth. Taking three times a week     montelukast (SINGULAIR) 10 MG tablet TAKE 1 TABLET BY MOUTH EVERYDAY AT BEDTIME 90 tablet 2   PRALUENT 75 MG/ML SOAJ INJECT 1 ML INTO THE SKIN EVERY 14 DAYS 2 mL 11   sacubitril-valsartan (ENTRESTO) 24-26 MG Take 1 tablet by mouth 2 (two) times daily. 60 tablet 9   Turmeric 500 MG CAPS Take 1 capsule by mouth at bedtime.     levothyroxine (SYNTHROID) 50 MCG tablet AS DIRECTED ORALLY ONCE A DAY X6 DAYX AND SKIP ON SUNDAYS 90 DAYS 30 tablet 0   No facility-administered medications prior to visit.    ROS: Review of Systems  Constitutional:  Negative for appetite change, fatigue and unexpected weight change.  HENT:  Negative for congestion, nosebleeds, sneezing, sore throat and trouble swallowing.   Eyes:  Negative for itching and visual disturbance.  Respiratory:  Negative for cough.   Cardiovascular:  Negative for chest pain,  palpitations and leg swelling.  Gastrointestinal:  Negative for abdominal distention, blood in stool, diarrhea and nausea.  Genitourinary:  Negative for frequency and hematuria.  Musculoskeletal:  Negative for back pain, gait problem, joint swelling and neck pain.  Skin:  Negative for rash.  Neurological:  Negative for dizziness, tremors, speech difficulty and weakness.  Psychiatric/Behavioral:  Negative for agitation, dysphoric mood and sleep disturbance. The patient is not nervous/anxious.     Objective:  BP 112/62 (BP Location: Left Arm)   Pulse (!) 48   Temp 97.8 F (36.6 C) (Oral)   Ht '6\' 1"'$  (1.854 m)   Wt 175 lb 9.6 oz (79.7 kg)   SpO2 98%   BMI 23.17 kg/m   BP Readings from Last 3 Encounters:  06/12/22 112/62  04/10/22 108/60  01/31/22 106/66    Wt Readings from Last 3 Encounters:  06/12/22 175 lb 9.6 oz (79.7 kg)  04/10/22 176 lb (79.8 kg)  01/31/22 183 lb (83 kg)    Physical Exam Constitutional:      General: He is not in acute distress.    Appearance: He is well-developed.     Comments: NAD  Eyes:     Conjunctiva/sclera: Conjunctivae normal.     Pupils: Pupils are equal, round, and reactive to light.  Neck:     Thyroid: No thyromegaly.  Vascular: No JVD.  Cardiovascular:     Rate and Rhythm: Regular rhythm. Bradycardia present.     Heart sounds: Normal heart sounds. No murmur heard.    No friction rub. No gallop.  Pulmonary:     Effort: Pulmonary effort is normal. No respiratory distress.     Breath sounds: Normal breath sounds. No wheezing or rales.  Chest:     Chest wall: No tenderness.  Abdominal:     General: Bowel sounds are normal. There is no distension.     Palpations: Abdomen is soft. There is no mass.     Tenderness: There is no abdominal tenderness. There is no guarding or rebound.  Musculoskeletal:        General: No tenderness. Normal range of motion.     Cervical back: Normal range of motion.  Lymphadenopathy:     Cervical: No  cervical adenopathy.  Skin:    General: Skin is warm and dry.     Findings: No rash.  Neurological:     Mental Status: He is alert and oriented to person, place, and time.     Cranial Nerves: No cranial nerve deficit.     Motor: No abnormal muscle tone.     Coordination: Coordination normal.     Gait: Gait normal.     Deep Tendon Reflexes: Reflexes are normal and symmetric.  Psychiatric:        Behavior: Behavior normal.        Thought Content: Thought content normal.        Judgment: Judgment normal.     Lab Results  Component Value Date   WBC 4.7 06/12/2022   HGB 13.3 06/12/2022   HCT 39.6 06/12/2022   PLT 188.0 06/12/2022   GLUCOSE 90 06/12/2022   CHOL 187 06/12/2022   TRIG 158.0 (H) 06/12/2022   HDL 49.00 06/12/2022   LDLDIRECT 106 (H) 12/21/2021   LDLCALC 106 (H) 06/12/2022   ALT 15 06/12/2022   AST 18 06/12/2022   NA 137 06/12/2022   K 4.3 06/12/2022   CL 105 06/12/2022   CREATININE 1.07 06/12/2022   BUN 28 (H) 06/12/2022   CO2 25 06/12/2022   TSH 2.21 06/12/2022   PSA 3.29 06/12/2022   INR 1.0 08/21/2021   HGBA1C 5.6 10/11/2016    MR ANGIO NECK W WO CONTRAST  Result Date: 09/19/2021 CLINICAL DATA:  Transient ischemic attack. EXAM: MRA NECK WITHOUT AND WITH CONTRAST MRA HEAD WITHOUT CONTRAST TECHNIQUE: Multiplanar and multiecho pulse sequences of the neck were obtained without and with intravenous contrast. Angiographic images of the neck were obtained using MRA technique without and with intravenous contrast; Angiographic images of the Circle of Willis were obtained using MRA technique without intravenous contrast. CONTRAST:  74m MULTIHANCE GADOBENATE DIMEGLUMINE 529 MG/ML IV SOLN COMPARISON:  MRI of the brain August 21, 2021. FINDINGS: MRA NECK FINDINGS Aortic arch: Normal variant aortic arch branching pattern with the brachiocephalic and left common carotid arteries sharing a common origin. The visualized subclavian arteries are normal. Right carotid system:  Normal course and caliber without stenosis or evidence of dissection. Left carotid system: Normal course and caliber without stenosis or evidence of dissection. Vertebral arteries: Codominant. Vertebral artery origins are normal. Vertebral arteries are normal in course and caliber to the vertebrobasilar confluence without stenosis or evidence of dissection. MRA HEAD FINDINGS The visualized portions of the distal cervical and intracranial internal carotid arteries are widely patent with normal flow related enhancement. The bilateral anterior cerebral arteries are patent  with antegrade flow without high-grade flow-limiting stenosis or proximal branch occlusion. Attenuation of the flow related enhancement at the bilateral M1/MCA bifurcation and proximal M2 branches is artifactual. No intracranial aneurysm within the anterior circulation. Attenuation of the flow related enhancement in the bilateral V3-V4 junction is artifactual. Vertebral arteries are otherwise unremarkable. Vertebrobasilar junction and basilar artery are widely patent with antegrade flow without evidence of basilar stenosis or aneurysm. Posterior cerebral arteries are normal bilaterally. No intracranial aneurysm within the posterior circulation. IMPRESSION: No significant vascular abnormality of the of the Ellis arteries of the head and neck. Electronically Signed   By: Pedro Earls M.D.   On: 09/19/2021 11:14   MR ANGIO HEAD WO CONTRAST  Result Date: 09/19/2021 CLINICAL DATA:  Transient ischemic attack. EXAM: MRA NECK WITHOUT AND WITH CONTRAST MRA HEAD WITHOUT CONTRAST TECHNIQUE: Multiplanar and multiecho pulse sequences of the neck were obtained without and with intravenous contrast. Angiographic images of the neck were obtained using MRA technique without and with intravenous contrast; Angiographic images of the Circle of Willis were obtained using MRA technique without intravenous contrast. CONTRAST:  14m MULTIHANCE GADOBENATE  DIMEGLUMINE 529 MG/ML IV SOLN COMPARISON:  MRI of the brain August 21, 2021. FINDINGS: MRA NECK FINDINGS Aortic arch: Normal variant aortic arch branching pattern with the brachiocephalic and left common carotid arteries sharing a common origin. The visualized subclavian arteries are normal. Right carotid system: Normal course and caliber without stenosis or evidence of dissection. Left carotid system: Normal course and caliber without stenosis or evidence of dissection. Vertebral arteries: Codominant. Vertebral artery origins are normal. Vertebral arteries are normal in course and caliber to the vertebrobasilar confluence without stenosis or evidence of dissection. MRA HEAD FINDINGS The visualized portions of the distal cervical and intracranial internal carotid arteries are widely patent with normal flow related enhancement. The bilateral anterior cerebral arteries are patent with antegrade flow without high-grade flow-limiting stenosis or proximal branch occlusion. Attenuation of the flow related enhancement at the bilateral M1/MCA bifurcation and proximal M2 branches is artifactual. No intracranial aneurysm within the anterior circulation. Attenuation of the flow related enhancement in the bilateral V3-V4 junction is artifactual. Vertebral arteries are otherwise unremarkable. Vertebrobasilar junction and basilar artery are widely patent with antegrade flow without evidence of basilar stenosis or aneurysm. Posterior cerebral arteries are normal bilaterally. No intracranial aneurysm within the posterior circulation. IMPRESSION: No significant vascular abnormality of the of the Ellis arteries of the head and neck. Electronically Signed   By: KPedro EarlsM.D.   On: 09/19/2021 11:14    Assessment & Plan:   Problem List Items Addressed This Visit     Dyslipidemia    On Repatha      Relevant Orders   TSH (Completed)   Urinalysis (Completed)   CBC with Differential/Platelet (Completed)    Lipid panel (Completed)   PSA (Completed)   Comprehensive metabolic panel (Completed)   Elevated PSA - Primary   Relevant Orders   PSA (Completed)   History of CVA (cerebrovascular accident)    No relapse      Relevant Orders   TSH (Completed)   Urinalysis (Completed)   CBC with Differential/Platelet (Completed)   Lipid panel (Completed)   PSA (Completed)   Comprehensive metabolic panel (Completed)   Hypothyroidism    On Levothyroxine Monitor TSH      Relevant Medications   levothyroxine (SYNTHROID) 50 MCG tablet   Other Relevant Orders   TSH (Completed)   Urinalysis (Completed)  CBC with Differential/Platelet (Completed)   Lipid panel (Completed)   PSA (Completed)   Comprehensive metabolic panel (Completed)   TIA (transient ischemic attack)    No relapse         Meds ordered this encounter  Medications   levothyroxine (SYNTHROID) 50 MCG tablet    Sig: AS DIRECTED ORALLY ONCE A DAY X6 DAYX AND SKIP ON SUNDAYS 90 DAYS    Dispense:  90 tablet    Refill:  3    Annual appt due in Sept must see provider for future refills      Follow-up: Return in about 6 months (around 12/11/2022) for Wellness Exam.  [name], MD

## 2022-06-14 ENCOUNTER — Ambulatory Visit: Payer: Medicare Other | Admitting: Adult Health

## 2022-06-19 ENCOUNTER — Ambulatory Visit: Payer: Medicare Other | Admitting: Adult Health

## 2022-07-09 ENCOUNTER — Encounter: Payer: Self-pay | Admitting: Adult Health

## 2022-07-09 ENCOUNTER — Ambulatory Visit (INDEPENDENT_AMBULATORY_CARE_PROVIDER_SITE_OTHER): Payer: Medicare Other | Admitting: Adult Health

## 2022-07-09 VITALS — BP 87/56 | HR 55 | Ht 73.0 in | Wt 173.6 lb

## 2022-07-09 DIAGNOSIS — G43009 Migraine without aura, not intractable, without status migrainosus: Secondary | ICD-10-CM | POA: Diagnosis not present

## 2022-07-09 DIAGNOSIS — I255 Ischemic cardiomyopathy: Secondary | ICD-10-CM | POA: Diagnosis not present

## 2022-07-09 DIAGNOSIS — G459 Transient cerebral ischemic attack, unspecified: Secondary | ICD-10-CM
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
## 2022-07-10 ENCOUNTER — Other Ambulatory Visit: Payer: Self-pay | Admitting: Cardiovascular Disease

## 2022-07-30 ENCOUNTER — Ambulatory Visit: Payer: Self-pay | Admitting: Licensed Clinical Social Worker

## 2022-07-30 NOTE — Patient Outreach (Signed)
  Care Coordination   Initial Visit Note   07/30/2022 Name: THURL BOEN MRN: [id number] DOB: 03-10-1946  [name] is a 76 y.o. year old male who sees Plotnikov, Evie Lacks, MD for primary care. I spoke with  [name] by phone today.  What matters to the patients health and wellness today? Client said he was generally doing well and did not have program needs at present. Appreciative of call from [place]             This Visit's Progress    patient said he has had two falls recently and is resting at home to recover from falls        Interventions: Discussed program support with client Discussed client falls. He said he is resting at home recovering from these falls Discussed family support Discussed medication procurement Discussed client needs.  He was appreciative of call. Discussed client care with PCP Encouraged client to call LCSW at [phone]. as needed to discuss program support. Client said in general he was doing well and did  not have any specific program needs at this time    SDOH assessments and interventions completed:  Yes  SDOH Interventions Today    Flowsheet Row Most Recent Value  SDOH Interventions   Physical Activity Interventions Other (Comments)  [client is at risk for falls. He has had a fall recently]  Stress Interventions Other (Comment)  [stress issues related to managing medical needs]        Care Coordination Interventions Activated:  Yes  Care Coordination Interventions:  Yes, provided   Follow up plan: No further intervention required.   Encounter Outcome:  Pt. Visit Completed   Norva Riffle.Anas Reister MSW, [place] Holiday representative [facility] Care Management [phone]

## 2022-07-30 NOTE — Patient Instructions (Addendum)
Visit Information  Thank you for taking time to visit with me today. Please don't hesitate to contact me if I can be of assistance to you before our next scheduled telephone appointment.  Following are the goals we discussed today:   No further interventions needed at this time  Please call the care guide team at [phone] if you need to cancel or reschedule your appointment.   If you are experiencing a Mental Health or [place] or need someone to talk to, please go to [facility] [place] [phone])   Following is a copy of your full plan of care:    Interventions: Discussed program support with client Discussed client falls. He said he is resting at home recovering from these falls Discussed family support Discussed medication procurement Discussed client needs.  He was appreciative of call. Discussed client care with PCP Encouraged client to call LCSW at [phone]. as needed to discuss program support. Client said in general he was doing well and did  not have any specific program needs at this time  Richard Ellis was given information about Care Management services by the embedded care coordination team including:  Care Management services include personalized support from designated clinical staff supervised by his physician, including individualized plan of care and coordination with other care providers 24/7 contact phone numbers for assistance for urgent and routine care needs. The patient may stop CCM services at any time (effective at the end of the month) by phone call to the office staff.  Patient agreed to services and verbal consent obtained.   Richard Ellis.Richard Ellis MSW, [place] Holiday representative [facility] Care Management [phone]

## 2022-07-31 ENCOUNTER — Encounter: Payer: Self-pay | Admitting: Cardiovascular Disease

## 2022-07-31 ENCOUNTER — Ambulatory Visit (HOSPITAL_COMMUNITY): Payer: Medicare Other | Attending: Cardiovascular Disease

## 2022-07-31 DIAGNOSIS — I255 Ischemic cardiomyopathy: Secondary | ICD-10-CM | POA: Diagnosis not present

## 2022-07-31 DIAGNOSIS — I25810 Atherosclerosis of coronary artery bypass graft(s) without angina pectoris: Secondary | ICD-10-CM | POA: Diagnosis not present

## 2022-07-31 DIAGNOSIS — E782 Mixed hyperlipidemia: Secondary | ICD-10-CM

## 2022-07-31 LAB — ECHOCARDIOGRAM COMPLETE
Area-P 1/2: 2.74 cm2
P 1/2 time: 696 msec
S' Lateral: 3.6 cm

## 2022-08-21 ENCOUNTER — Other Ambulatory Visit: Payer: Self-pay | Admitting: Cardiovascular Disease

## 2022-09-19 ENCOUNTER — Encounter: Payer: Self-pay | Admitting: Pharmacist

## 2022-09-20 ENCOUNTER — Encounter: Payer: Self-pay | Admitting: Pharmacist

## 2022-09-20 NOTE — Telephone Encounter (Signed)
This encounter was created in error - please disregard.

## 2022-09-24 ENCOUNTER — Telehealth: Payer: Self-pay | Admitting: Pharmacist

## 2022-09-24 ENCOUNTER — Other Ambulatory Visit: Payer: Self-pay | Admitting: *Deleted

## 2022-09-24 DIAGNOSIS — E782 Mixed hyperlipidemia: Secondary | ICD-10-CM

## 2022-09-24 MED ORDER — LEVOTHYROXINE SODIUM 50 MCG PO TABS: ORAL_TABLET | ORAL | 2 refills | Status: DC

## 2022-09-24 MED ORDER — DAPAGLIFLOZIN PROPANEDIOL 10 MG PO TABS: 10.0000 mg | ORAL_TABLET | Freq: Every day | ORAL | 2 refills | Status: DC

## 2022-09-24 NOTE — Telephone Encounter (Signed)
Received PA request form for Praluent from [facility] since it's non formulary. Preferred formulary alternatives are Repatha, Nexletol, or Evkeeza.  Pt has already taken Repatha but experienced myalgias. Nexletol is not an effective option to bring LDL to goal (only lowers LDL by 20% and pt's baseline LDL is > 200, compared to Praluent which lowers LDL by 60%). Evkeeza isn't even an appropriate substitute since it's only FDA approved for pts with HoFH which pt does not have.  He is intolerant to rosuvastatin, atorvastatin, Livalo, ezetimibe, Repatha, and niacin. Muscle weakness on statins and Repatha, flushing with niacin.  Pt already established on Praluent with LDL drop from 204 to 106. Needs to continue therapy. Hx of ASCVD with CABG in 2018 and CVA in 2014. PA faxed to [facility].

## 2022-09-25 MED ORDER — PRALUENT 75 MG/ML ~~LOC~~ SOAJ: SUBCUTANEOUS | 11 refills | Status: DC

## 2022-09-25 NOTE — Telephone Encounter (Signed)
Praluent prior auth approved through 09/24/23. Refill sent to pharmacy, pt made aware via MyChart.

## 2022-10-23 ENCOUNTER — Other Ambulatory Visit: Payer: Self-pay | Admitting: Nurse Practitioner

## 2022-10-23 ENCOUNTER — Other Ambulatory Visit: Payer: Self-pay | Admitting: Cardiovascular Disease

## 2022-10-23 DIAGNOSIS — J4520 Mild intermittent asthma, uncomplicated: Secondary | ICD-10-CM

## 2022-11-20 ENCOUNTER — Ambulatory Visit: Payer: Medicare Other

## 2022-11-20 ENCOUNTER — Ambulatory Visit (INDEPENDENT_AMBULATORY_CARE_PROVIDER_SITE_OTHER): Payer: Medicare Other | Admitting: Family Medicine

## 2022-11-20 ENCOUNTER — Encounter: Payer: Self-pay | Admitting: Family Medicine

## 2022-11-20 VITALS — BP 92/58 | Ht 73.0 in | Wt 178.0 lb

## 2022-11-20 DIAGNOSIS — M216X1 Other acquired deformities of right foot: Secondary | ICD-10-CM

## 2022-11-20 DIAGNOSIS — M216X9 Other acquired deformities of unspecified foot: Secondary | ICD-10-CM | POA: Insufficient documentation

## 2022-11-20 DIAGNOSIS — M79671 Pain in right foot: Secondary | ICD-10-CM

## 2022-11-20 NOTE — Patient Instructions (Signed)
Secondary to friction Pad or thick bandaid Spenco Total Support Original Recovery Sandals Watch the arm See me in 2 months

## 2022-11-20 NOTE — Assessment & Plan Note (Signed)
Will breakdown the transverse arch causing patient to have more friction and starting to run blisters.  Discussed with patient about the proper way of wrapping, proper metatarsal pads that would be necessary as well.  Follow-up again in 6 to 8 weeks

## 2022-11-20 NOTE — Assessment & Plan Note (Signed)
Patient does have breakdown of the transverse arch causing more of friction between the toes causing actually patient to have a blister

## 2022-11-20 NOTE — Progress Notes (Signed)
[facility] [place] Phone: [phone] Subjective:   [name], am serving as a scribe for Dr. Hulan Saas.  I'm seeing this patient by the request  of:  Plotnikov, Evie Lacks, MD  CC: Toe pain  [id number]  Richard Ellis is a 77 y.o. male coming in with complaint of R foot pain for the past year. Last seen in July 2023 for shoulder pain. Patient states that he his 2nd toe over PIP. Joint in valgus. Patient unable to stand on his bike and push pedal. Walking also hurts this joint. Patient still able to run but pushes through pain. Has tried various toe spacers that were recommended by Dr. Oneida Alar.       Past Medical History:  Diagnosis Date   Allergy    rhinitis   Anemia    "Hgb always on the low side" (07/02/2013)   CAD (coronary artery disease)    a. LHC 2/17: pLAD 50, mLAD 95, pRCA 30, dRCA 25, EF 50-55% >> PCI:  3 x 18 mm Resolute DES to mLAD    Chronic bronchitis ([place])    "not in the last year since I started taking allergy shots" (10/09/2016)   Chronic lower back pain    Chronic sinus bradycardia 07/02/2013   Coronary artery disease 10/15/2016   Cryptogenic stroke ([place]) 07/02/2013   a. "small ischemic occipital right sided" (07/02/2013)  //  b. Event monitor 10/14: sinus brady  //  c. Carotid US 10/14: bilat ICA 1-39%   Esophageal stricture    Exertional angina 10/09/2016   Heart murmur    "as a child" (07/02/2013)   History of echocardiogram    a. Echo 10/14: EF 60-65%, no RWMA, normal diastolic function   Hyperlipidemia    Hypothyroidism    Nonallopathic lesion of lumbosacral region 06/04/2017   Nonallopathic lesion of sacral region 06/04/2017   Nonallopathic lesion of thoracic region 06/04/2017   Osteoarthritis    "lower back" (10/09/2016)   PFO (patent foramen ovale)    a. TEE 11/14: mild LVH, EF 60-65%, small PFO, R-L shunt   Past Surgical History:  Procedure Laterality Date   CARDIAC  CATHETERIZATION N/A 11/10/2015   Procedure: Left Heart Cath and Coronary Angiography;  Surgeon: [name], MD;  Location: [place] [facility];  Service: Cardiovascular;  Laterality: N/A;   CARDIAC CATHETERIZATION N/A 11/10/2015   Procedure: Coronary Stent Intervention;  Surgeon: [name], MD;  Location: [place] [facility];  Service: Cardiovascular;  Laterality: N/A;  mid lad  3.0x18 resolute   CARDIAC CATHETERIZATION  10/09/2016   "scheduled OHS for tomorrow" (10/09/2016)   CARDIAC CATHETERIZATION N/A 10/09/2016   Procedure: Left Heart Cath and Coronary Angiography;  Surgeon: [name], MD;  Location: [place] [facility];  Service: Cardiovascular;  Laterality: N/A;   CARDIAC CATHETERIZATION N/A 10/09/2016   Procedure: Coronary Balloon Angioplasty;  Surgeon: [name], MD;  Location: [place] [facility];  Service: Cardiovascular;  Laterality: N/A;   CORONARY ARTERY BYPASS GRAFT N/A 10/15/2016   Procedure: CORONARY ARTERY BYPASS GRAFTING (CABG), ON PUMP, TIMES TWO, USING LEFT INTERNAL MAMMARY ARTERY AND RIGHT GREATER SAPHENOUS VEIN HARVESTED ENDOSCOPICALLY;  Surgeon: Ivin Poot, MD;  Location: [place];  Service: Open Heart Surgery;  Laterality: N/A;  LIMA to LAD, SVG to DIAGONAL   ESOPHAGOGASTRODUODENOSCOPY N/A 10/26/2012   Procedure: ESOPHAGOGASTRODUODENOSCOPY (EGD);  Surgeon: Winfield Cunas., MD;  Location: [facility] ENDOSCOPY;  Service: Endoscopy;  Laterality:  N/A;   ESOPHAGOGASTRODUODENOSCOPY N/A 07/08/2013   Procedure: ESOPHAGOGASTRODUODENOSCOPY (EGD);  Surgeon: Winfield Cunas., MD;  Location: [facility]OS[facility];  Service: Endoscopy;  Laterality: N/A;   INNER EAR SURGERY Left 1957   "related to ear infection"   REPAIR OF PATENT FORAMEN OVALE N/A 10/15/2016   Procedure: REPAIR OF PATENT FORAMEN OVALE;  Surgeon: Ivin Poot, MD;  Location: [place];  Service: Open Heart Surgery;  Laterality: N/A;   SAVORY DILATION N/A 07/08/2013   Procedure: SAVORY DILATION;  Surgeon: Winfield Cunas., MD;  Location: [facility] ENDOSCOPY;  Service: Endoscopy;  Laterality: N/A;   TEE WITHOUT CARDIOVERSION N/A 07/28/2013   Procedure: TRANSESOPHAGEAL ECHOCARDIOGRAM (TEE);  Surgeon: Pixie Casino, MD;  Location: [facility] ENDOSCOPY;  Service: Cardiovascular;  Laterality: N/A;   TEE WITHOUT CARDIOVERSION N/A 10/15/2016   Procedure: TRANSESOPHAGEAL ECHOCARDIOGRAM (TEE);  Surgeon: Ivin Poot, MD;  Location: [place];  Service: Open Heart Surgery;  Laterality: N/A;   TONSILLECTOMY  1950's   Social History   Socioeconomic History   Marital status: Married    Spouse name: Not on file   Number of children: 1   Years of education: Not on file   Highest education level: Not on file  Occupational History   Occupation: Retired  Tobacco Use   Smoking status: Never   Smokeless tobacco: Never  Vaping Use   Vaping Use: Never used  Substance and Sexual Activity   Alcohol use: Yes    Alcohol/week: 8.0 standard drinks of alcohol    Types: 4 Glasses of wine, 4 Cans of beer per week    Comment: weekly   Drug use: No   Sexual activity: Yes  Other Topics Concern   Not on file  Social History Narrative   Not on file   Social Determinants of Health   Financial Resource Strain: Low Risk  (04/06/2022)   Overall Financial Resource Strain (CARDIA)    Difficulty of Paying Living Expenses: Not hard at all  Food Insecurity: No Food Insecurity (04/06/2022)   Hunger Vital Sign    Worried About Running Out of Food in the Last Year: Never true    Ran Out of Food in the Last Year: Never true  Transportation Needs: No Transportation Needs (04/06/2022)   PRAPARE - Hydrologist (Medical): No    Lack of Transportation (Non-Medical): No  Physical Activity: Insufficiently Active (07/30/2022)   Exercise Vital Sign    Days of Exercise per Week: 2 days    Minutes of Exercise per Session: 10 min  Stress: Stress Concern Present (07/30/2022)   Pump Back    Feeling of Stress : To some extent  Social Connections: Socially Integrated (04/06/2022)   Social Connection and Isolation Panel [NHANES]    Frequency of Communication with Friends and Family: Three times a week    Frequency of Social Gatherings with Friends and Family: Three times a week    Attends Religious Services: More than 4 times per year    Active Member of Clubs or Organizations: Yes    Attends Music therapist: More than 4 times per year    Marital Status: Married   Allergies  Allergen Reactions   Niacin Er Other (See Comments)   Statins Other (See Comments)   Atorvastatin Other (See Comments)    Muscle weakness   Crestor [Rosuvastatin] Other (See Comments)    Muscle weakness   Demerol [Meperidine]  Nausea Only   Niacin And Related Itching, Rash and Other (See Comments)    Palms turned red and itched   Repatha [Evolocumab] Other (See Comments)    Muscle weakness   Family History  Problem Relation Age of Onset   Coronary artery disease Other        male first degree relative <60   Heart disease Father        heart attack   COPD Mother    Heart disease Paternal Uncle    Colon cancer Neg Hx     Current Outpatient Medications (Endocrine & Metabolic):    dapagliflozin propanediol (FARXIGA) 10 MG TABS tablet, Take 1 tablet (10 mg total) by mouth daily before breakfast.   levothyroxine (SYNTHROID) 50 MCG tablet, AS DIRECTED ORALLY ONCE A DAY X6 DAYX AND SKIP ON SUNDAYS 90 DAYS  Current Outpatient Medications (Cardiovascular):    Alirocumab (PRALUENT) 75 MG/ML SOAJ, INJECT 1 ML INTO THE SKIN EVERY 14 DAYS   sacubitril-valsartan (ENTRESTO) 24-26 MG, Take 1 tablet by mouth 2 (two) times daily.  Current Outpatient Medications (Respiratory):    budesonide-formoterol (SYMBICORT) 80-4.5 MCG/ACT inhaler, Inhale 2 puffs into the lungs daily.   montelukast (SINGULAIR) 10 MG tablet, TAKE 1 TABLET BY MOUTH EVERYDAY AT  BEDTIME  Current Outpatient Medications (Analgesics):    aspirin EC 81 MG tablet, Take 1 tablet (81 mg total) by mouth daily.  Current Outpatient Medications (Hematological):    Ferrous Sulfate (IRON) 325 (65 Fe) MG TABS, Take 1 tablet by mouth. Taking three times a week  Current Outpatient Medications (Other):    ALPRAZolam (XANAX) 0.5 MG tablet, Take 0.5 mg by mouth as needed for anxiety (for flying).   Cholecalciferol 2000 UNITS CAPS, Take 2,000 Units by mouth every evening.   Diclofenac Sodium 1.5 % SOLN, Apply 1-2 Pump topically 2 (two) times daily as needed.   Turmeric 500 MG CAPS, Take 1 capsule by mouth at bedtime.   Reviewed prior external information including notes and imaging from  primary care provider As well as notes that were available from care everywhere and other healthcare systems.  Past medical history, social, surgical and family history all reviewed in electronic medical record.  No pertanent information unless stated regarding to the chief complaint.   Review of Systems:  No headache, visual changes, nausea, vomiting, diarrhea, constipation, dizziness, abdominal pain, skin rash, fevers, chills, night sweats, weight loss, swollen lymph nodes, body aches, joint swelling, chest pain, shortness of breath, mood changes. POSITIVE muscle aches  Objective  Blood pressure (!) 92/58, height '6\' 1"'$  (1.854 m), weight 178 lb (80.7 kg).   General: No apparent distress alert and oriented x3 mood and affect normal, dressed appropriately.  HEENT: Pupils equal, extraocular movements intact  Respiratory: Patient's speak in full sentences and does not appear short of breath  Cardiovascular: No lower extremity edema, non tender, no erythema  Patient foot exam shows breakdown of the transverse arch noted.  Patient does have some limited range of motion of the first MTP.  Patient does have what appears to be more of a blister formation on the tibial side of the second toe and the inter  phalanx area.   Limited muscular skeletal ultrasound was performed and interpreted by Hulan Saas, M  Limited ultrasound shows the patient does have a significant bone spur of the first metatarsal with moderate narrowing of the first MTP.  No significant hypoechoic changes that are noted.  Patient does not have any symptoms of the second  toe that does look like there is any type of infectious etiology of the soft tissue.   Impression and Recommendations:     The above documentation has been reviewed and is accurate and complete Lyndal Pulley, DO

## 2022-12-24 ENCOUNTER — Ambulatory Visit: Payer: Medicare Other | Admitting: Family Medicine

## 2023-01-20 ENCOUNTER — Other Ambulatory Visit: Payer: Self-pay | Admitting: Nurse Practitioner

## 2023-01-20 DIAGNOSIS — J4520 Mild intermittent asthma, uncomplicated: Secondary | ICD-10-CM

## 2023-01-21 NOTE — Progress Notes (Unsigned)
[facility] [address] [place] [address] Phone: [phone] Subjective:   Richard Ellis, am serving as a scribe for Dr. Antoine Primas.  I'm seeing this patient by the request  of:  Plotnikov, Georgina Quint, MD  CC: Right foot pain follow-up  [id number]  11/20/2022       Will breakdown the transverse arch causing patient to have more friction and starting to run blisters.  Discussed with patient about the proper way of wrapping, proper metatarsal pads that would be necessary as well.  Follow-up again in 6 to 8 weeks      Update 01/22/2023 Richard Ellis is a 77 y.o. male coming in with complaint of R foot pain. Patient states that he is is using a spacer in between toes which seems to be helpful. Very little pain in toe.     Past Medical History:  Diagnosis Date   Allergy    rhinitis   Anemia    "Hgb always on the low side" (07/02/2013)   CAD (coronary artery disease)    a. LHC 2/17: pLAD 50, mLAD 95, pRCA 30, dRCA 25, EF 50-55% >> PCI:  3 x 18 mm Resolute DES to mLAD    Chronic bronchitis ([facility])    "not in the last year since I started taking allergy shots" (10/09/2016)   Chronic lower back pain    Chronic sinus bradycardia 07/02/2013   Coronary artery disease 10/15/2016   Cryptogenic stroke ([facility]) 07/02/2013   a. "small ischemic occipital right sided" (07/02/2013)  //  b. Event monitor 10/14: sinus brady  //  c. Carotid US 10/14: bilat ICA 1-39%   Esophageal stricture    Exertional angina 10/09/2016   Heart murmur    "as a child" (07/02/2013)   History of echocardiogram    a. Echo 10/14: EF 60-65%, no RWMA, normal diastolic function   Hyperlipidemia    Hypothyroidism    Nonallopathic lesion of lumbosacral region 06/04/2017   Nonallopathic lesion of sacral region 06/04/2017   Nonallopathic lesion of thoracic region 06/04/2017   Osteoarthritis    "lower back" (10/09/2016)   PFO (patent foramen ovale)    a. TEE 11/14: mild LVH, EF  60-65%, small PFO, R-L shunt   Past Surgical History:  Procedure Laterality Date   CARDIAC CATHETERIZATION N/A 11/10/2015   Procedure: Left Heart Cath and Coronary Angiography;  Surgeon: Tonny Bollman, MD;  Location: [facility] INVASIVE CV LAB;  Service: Cardiovascular;  Laterality: N/A;   CARDIAC CATHETERIZATION N/A 11/10/2015   Procedure: Coronary Stent Intervention;  Surgeon: Tonny Bollman, MD;  Location: [facility] INVASIVE CV LAB;  Service: Cardiovascular;  Laterality: N/A;  mid lad  3.0x18 resolute   CARDIAC CATHETERIZATION  10/09/2016   "scheduled OHS for tomorrow" (10/09/2016)   CARDIAC CATHETERIZATION N/A 10/09/2016   Procedure: Left Heart Cath and Coronary Angiography;  Surgeon: Tonny Bollman, MD;  Location: [facility] INVASIVE CV LAB;  Service: Cardiovascular;  Laterality: N/A;   CARDIAC CATHETERIZATION N/A 10/09/2016   Procedure: Coronary Balloon Angioplasty;  Surgeon: Tonny Bollman, MD;  Location: [facility] INVASIVE CV LAB;  Service: Cardiovascular;  Laterality: N/A;   CORONARY ARTERY BYPASS GRAFT N/A 10/15/2016   Procedure: CORONARY ARTERY BYPASS GRAFTING (CABG), ON PUMP, TIMES TWO, USING LEFT INTERNAL MAMMARY ARTERY AND RIGHT GREATER SAPHENOUS VEIN HARVESTED ENDOSCOPICALLY;  Surgeon: Kerin Perna, MD;  Location[facility];  Service: Open Heart Surgery;  Laterality: N/A;  LIMA to LAD, SVG to DIAGONAL   ESOPHAGOGASTRODUODENOSCOPY N/A 10/26/2012  Procedure: ESOPHAGOGASTRODUODENOSCOPY (EGD);  Surgeon: Vertell Novak., MD;  Location: [facility] ENDOSCOPY;  Service: Endoscopy;  Laterality: N/A;   ESOPHAGOGASTRODUODENOSCOPY N/A 07/08/2013   Procedure: ESOPHAGOGASTRODUODENOSCOPY (EGD);  Surgeon: Vertell Novak., MD;  Location: [facility];  Service: Endoscopy;  Laterality: N/A;   INNER EAR SURGERY Left 1957   "related to ear infection"   REPAIR OF PATENT FORAMEN OVALE N/A 10/15/2016   Procedure: REPAIR OF PATENT FORAMEN OVALE;  Surgeon: Kerin Perna, MD;  Location: [facility] OR;  Service: Open Heart Surgery;  Laterality:  N/A;   SAVORY DILATION N/A 07/08/2013   Procedure: SAVORY DILATION;  Surgeon: Vertell Novak., MD;  Location: [facility] ENDOSCOPY;  Service: Endoscopy;  Laterality: N/A;   TEE WITHOUT CARDIOVERSION N/A 07/28/2013   Procedure: TRANSESOPHAGEAL ECHOCARDIOGRAM (TEE);  Surgeon: Chrystie Nose, MD;  Location: [facility] ENDOSCOPY;  Service: Cardiovascular;  Laterality: N/A;   TEE WITHOUT CARDIOVERSION N/A 10/15/2016   Procedure: TRANSESOPHAGEAL ECHOCARDIOGRAM (TEE);  Surgeon: Kerin Perna, MD;  Location: [facility] OR;  Service: Open Heart Surgery;  Laterality: N/A;   TONSILLECTOMY  1950's   Social History   Socioeconomic History   Marital status: Married    Spouse name: Not on file   Number of children: 1   Years of education: Not on file   Highest education level: Not on file  Occupational History   Occupation: Retired  Tobacco Use   Smoking status: Never   Smokeless tobacco: Never  Vaping Use   Vaping Use: Never used  Substance and Sexual Activity   Alcohol use: Yes    Alcohol/week: 8.0 standard drinks of alcohol    Types: 4 Glasses of wine, 4 Cans of beer per week    Comment: weekly   Drug use: No   Sexual activity: Yes  Other Topics Concern   Not on file  Social History Narrative   Not on file   Social Determinants of Health   Financial Resource Strain: Low Risk  (04/06/2022)   Overall Financial Resource Strain (CARDIA)    Difficulty of Paying Living Expenses: Not hard at all  Food Insecurity: No Food Insecurity (04/06/2022)   Hunger Vital Sign    Worried About Running Out of Food in the Last Year: Never true    Ran Out of Food in the Last Year: Never true  Transportation Needs: No Transportation Needs (04/06/2022)   PRAPARE - Administrator, Civil Service (Medical): No    Lack of Transportation (Non-Medical): No  Physical Activity: Insufficiently Active (07/30/2022)   Exercise Vital Sign    Days of Exercise per Week: 2 days    Minutes of Exercise per Session: 10 min   Stress: Stress Concern Present (07/30/2022)   [employer] of Occupational Health - Occupational Stress Questionnaire    Feeling of Stress : To some extent  Social Connections: Socially Integrated (04/06/2022)   Social Connection and Isolation Panel [NHANES]    Frequency of Communication with Friends and Family: Three times a week    Frequency of Social Gatherings with Friends and Family: Three times a week    Attends Religious Services: More than 4 times per year    Active Member of Clubs or Organizations: Yes    Attends Banker Meetings: More than 4 times per year    Marital Status: Married   Allergies  Allergen Reactions   Niacin Er Other (See Comments)   Statins Other (See Comments)   Atorvastatin Other (See Comments)  Muscle weakness   Crestor [Rosuvastatin] Other (See Comments)    Muscle weakness   Demerol [Meperidine] Nausea Only   Niacin And Related Itching, Rash and Other (See Comments)    Palms turned red and itched   Repatha [Evolocumab] Other (See Comments)    Muscle weakness   Family History  Problem Relation Age of Onset   Coronary artery disease Other        male first degree relative <60   Heart disease Father        heart attack   COPD Mother    Heart disease Paternal Uncle    Colon cancer Neg Hx     Current Outpatient Medications (Endocrine & Metabolic):    dapagliflozin propanediol (FARXIGA) 10 MG TABS tablet, Take 1 tablet (10 mg total) by mouth daily before breakfast.   levothyroxine (SYNTHROID) 50 MCG tablet, AS DIRECTED ORALLY ONCE A DAY X6 DAYX AND SKIP ON SUNDAYS 90 DAYS  Current Outpatient Medications (Cardiovascular):    Alirocumab (PRALUENT) 75 MG/ML SOAJ, INJECT 1 ML INTO THE SKIN EVERY 14 DAYS   sacubitril-valsartan (ENTRESTO) 24-26 MG, Take 1 tablet by mouth 2 (two) times daily.  Current Outpatient Medications (Respiratory):    budesonide-formoterol (SYMBICORT) 80-4.5 MCG/ACT inhaler, Inhale 2 puffs into the lungs  daily.   montelukast (SINGULAIR) 10 MG tablet, TAKE 1 TABLET BY MOUTH EVERYDAY AT BEDTIME  Current Outpatient Medications (Analgesics):    aspirin EC 81 MG tablet, Take 1 tablet (81 mg total) by mouth daily.  Current Outpatient Medications (Hematological):    Ferrous Sulfate (IRON) 325 (65 Fe) MG TABS, Take 1 tablet by mouth. Taking three times a week  Current Outpatient Medications (Other):    ALPRAZolam (XANAX) 0.5 MG tablet, Take 0.5 mg by mouth as needed for anxiety (for flying).   Cholecalciferol 2000 UNITS CAPS, Take 2,000 Units by mouth every evening.   Diclofenac Sodium 1.5 % SOLN, Apply 1-2 Pump topically 2 (two) times daily as needed.   Turmeric 500 MG CAPS, Take 1 capsule by mouth at bedtime.   Review of Systems:  No headache, visual changes, nausea, vomiting, diarrhea, constipation, dizziness, abdominal pain, skin rash, fevers, chills, night sweats, weight loss, swollen lymph nodes, body aches, joint swelling, chest pain, shortness of breath, mood changes. POSITIVE muscle aches  Objective  Pulse (!) 44, height 6\' 1"  (1.854 m), weight 175 lb (79.4 kg), SpO2 97 %.   General: No apparent distress alert and oriented x3 mood and affect normal, dressed appropriately.  HEENT: Pupils equal, extraocular movements intact  Respiratory: Patient's speak in full sentences and does not appear short of breath  Cardiovascular: No lower extremity edema, non tender, no erythema  Right foot pain but does have breakdown of the transverse arch noted.  Some tenderness to palpation in the paraspinal musculature.  Between the first and second toe patient does have some mild skin breakdown but seems to be having more healing aspect.  No sign of any infectious etiology at this time.    Impression and Recommendations:     The above documentation has been reviewed and is accurate and complete Judi Saa, DO

## 2023-01-22 ENCOUNTER — Telehealth: Payer: Self-pay | Admitting: Pharmacist

## 2023-01-22 ENCOUNTER — Other Ambulatory Visit: Payer: Self-pay

## 2023-01-22 ENCOUNTER — Ambulatory Visit (INDEPENDENT_AMBULATORY_CARE_PROVIDER_SITE_OTHER): Payer: Medicare Other | Admitting: Family Medicine

## 2023-01-22 ENCOUNTER — Encounter: Payer: Self-pay | Admitting: Family Medicine

## 2023-01-22 VITALS — HR 44 | Ht 73.0 in | Wt 175.0 lb

## 2023-01-22 DIAGNOSIS — M79671 Pain in right foot: Secondary | ICD-10-CM | POA: Diagnosis not present

## 2023-01-22 NOTE — Telephone Encounter (Signed)
Pt sent in mychart message reporting issues filling his Praluent still. I got a PA approval for the whole year in January so I'm not sure why he's been having issues with it, hoping you can take a look please! If a new prior authorization is needed, please use info from my 1/8 phone note, thanks!

## 2023-01-22 NOTE — Patient Instructions (Addendum)
Good to see you Consider adhesive mole skin Keep feet dry as much as possible Ultra Olympus 5 See me in 2 months

## 2023-01-22 NOTE — Assessment & Plan Note (Signed)
It does appear that the skin seems to be healing on its own at this time.  Patient has been using more of a silicone spacer.  We discussed with patient about normal skin with patient going to be traveling and potentially getting cream as well.  We discussed icing regimen and home exercises.  Discussed proper shoes.  Increase activity slowly.  Follow-up again in 6 to 8 weeks otherwise.

## 2023-01-23 ENCOUNTER — Telehealth: Payer: Self-pay

## 2023-01-23 ENCOUNTER — Other Ambulatory Visit: Payer: Self-pay | Admitting: Cardiovascular Disease

## 2023-01-23 ENCOUNTER — Other Ambulatory Visit (HOSPITAL_COMMUNITY): Payer: Self-pay

## 2023-01-23 NOTE — Telephone Encounter (Signed)
Pharmacy Patient Advocate Encounter   Received notification from [facility] that prior authorization for PRALUENT is needed.    PA submitted on 01/23/23 Key [id number] Status is pending  Haze Rushing, CPhT Pharmacy Patient Advocate Specialist Direct Number: [phone] Fax: [phone]

## 2023-01-23 NOTE — Telephone Encounter (Signed)
PA form pulled up in my CMM queue, I have submitted request - Key: [id number].

## 2023-01-24 ENCOUNTER — Other Ambulatory Visit (HOSPITAL_COMMUNITY): Payer: Self-pay

## 2023-01-24 NOTE — Telephone Encounter (Signed)
Called plan, it's pending!

## 2023-01-24 NOTE — Telephone Encounter (Signed)
Please keep an eye out for any faxed over info (or not sure if they can be contacted for it?). Received the below message in [facility] after Praluent PA was submitted:  Outcome N/A on May 8 Your PA request has been closed. Thank you for your ePA request for Praluent. Requests for initial Prior Authorizations require a secondary review to validate documented information. Current utilization, including samples, does not guarantee approval of coverage. The doctors office will be notified by fax listing the documentation required for review prior to a decision being made.

## 2023-01-29 NOTE — Telephone Encounter (Signed)
Plan asked for additional information. Faxing now.

## 2023-01-30 NOTE — Telephone Encounter (Signed)
Pharmacy Patient Advocate Encounter  Prior Authorization for PRALUENT 75MG /ML has been approved by [facility] (ins).    KEY# [id number]  Effective dates: 4.15.24 through 5.15.25

## 2023-01-31 NOTE — Telephone Encounter (Signed)
Pt notified of PA approval. 

## 2023-01-31 NOTE — Telephone Encounter (Signed)
Pt notified of approval

## 2023-02-04 ENCOUNTER — Other Ambulatory Visit: Payer: Self-pay | Admitting: Pharmacist

## 2023-02-04 MED ORDER — DAPAGLIFLOZIN PROPANEDIOL 10 MG PO TABS: 10.0000 mg | ORAL_TABLET | Freq: Every day | ORAL | 2 refills | Status: DC

## 2023-02-07 ENCOUNTER — Other Ambulatory Visit (HOSPITAL_COMMUNITY): Payer: Self-pay

## 2023-02-07 MED ORDER — DAPAGLIFLOZIN PROPANEDIOL 10 MG PO TABS
10.0000 mg | ORAL_TABLET | Freq: Every day | ORAL | 11 refills | Status: DC
  Filled 2023-02-07: qty 30, 30d supply, fill #0

## 2023-02-07 NOTE — Addendum Note (Signed)
Addended by: Ronzell Laban E on: 02/07/2023 04:07 PM   Modules accepted: Orders

## 2023-02-08 ENCOUNTER — Other Ambulatory Visit (HOSPITAL_COMMUNITY): Payer: Self-pay

## 2023-02-08 ENCOUNTER — Telehealth: Payer: Self-pay

## 2023-02-08 NOTE — Telephone Encounter (Addendum)
Pharmacy Patient Advocate Encounter   Received notification from [facility] that prior authorization for [facility] is required/requested.   PA submitted on 5.24.24 to (ins) [employer] via CoverMyMeds Key or (Medicaid) confirmation # [id number]  Status is pending

## 2023-02-09 ENCOUNTER — Other Ambulatory Visit (HOSPITAL_COMMUNITY): Payer: Self-pay

## 2023-02-12 ENCOUNTER — Other Ambulatory Visit (HOSPITAL_COMMUNITY): Payer: Self-pay

## 2023-02-12 NOTE — Telephone Encounter (Signed)
REINITIATED/APPEAL WRITTEN &FAXED OFF -waiting determination

## 2023-02-12 NOTE — Telephone Encounter (Signed)
Pharmacy Patient Advocate Encounter  Prior Authorization for FARXIGA 10MG  has been approved by [employer] FEP [employer] (ins).    (INITIATED VIA EMAIL Y.SEE APPROVAL IN PTS MEDIA)  Effective dates: 4.28.24 through 5.28.25

## 2023-02-18 NOTE — Telephone Encounter (Signed)
Pt notified of [place] approval via mychart.

## 2023-02-23 ENCOUNTER — Other Ambulatory Visit: Payer: Self-pay | Admitting: Cardiovascular Disease

## 2023-03-08 DIAGNOSIS — R0789 Other chest pain: Secondary | ICD-10-CM | POA: Diagnosis not present

## 2023-03-08 DIAGNOSIS — G8911 Acute pain due to trauma: Secondary | ICD-10-CM | POA: Diagnosis not present

## 2023-03-08 DIAGNOSIS — S2231XA Fracture of one rib, right side, initial encounter for closed fracture: Secondary | ICD-10-CM | POA: Diagnosis not present

## 2023-03-08 DIAGNOSIS — R0781 Pleurodynia: Secondary | ICD-10-CM | POA: Diagnosis not present

## 2023-03-08 DIAGNOSIS — R079 Chest pain, unspecified: Secondary | ICD-10-CM | POA: Diagnosis not present

## 2023-03-20 NOTE — Progress Notes (Deleted)
[facility] [address] [place] [address] Phone: [phone] Subjective:    I'm seeing this patient by the request  of:  Plotnikov, Georgina Quint, MD  CC:   [id number]  01/22/2023 It does appear that the skin seems to be healing on its own at this time. Patient has been using more of a silicone spacer. We discussed with patient about normal skin with patient going to be traveling and potentially getting cream as well. We discussed icing regimen and home exercises. Discussed proper shoes. Increase activity slowly. Follow-up again in 6 to 8 weeks otherwise.   Update 03/28/2023 Richard Ellis is a 77 y.o. male coming in with complaint of R foot pain. Patient states      Past Medical History:  Diagnosis Date   Allergy    rhinitis   Anemia    "Hgb always on the low side" (07/02/2013)   CAD (coronary artery disease)    a. LHC 2/17: pLAD 50, mLAD 95, pRCA 30, dRCA 25, EF 50-55% >> PCI:  3 x 18 mm Resolute DES to mLAD    Chronic bronchitis (HCC)    "not in the last year since I started taking allergy shots" (10/09/2016)   Chronic lower back pain    Chronic sinus bradycardia 07/02/2013   Coronary artery disease 10/15/2016   Cryptogenic stroke (HCC) 07/02/2013   a. "small ischemic occipital right sided" (07/02/2013)  //  b. Event monitor 10/14: sinus brady  //  c. Carotid US 10/14: bilat ICA 1-39%   Esophageal stricture    Exertional angina 10/09/2016   Heart murmur    "as a child" (07/02/2013)   History of echocardiogram    a. Echo 10/14: EF 60-65%, no RWMA, normal diastolic function   Hyperlipidemia    Hypothyroidism    Nonallopathic lesion of lumbosacral region 06/04/2017   Nonallopathic lesion of sacral region 06/04/2017   Nonallopathic lesion of thoracic region 06/04/2017   Osteoarthritis    "lower back" (10/09/2016)   PFO (patent foramen ovale)    a. TEE 11/14: mild LVH, EF 60-65%, small PFO, R-L shunt   Past Surgical History:  Procedure  Laterality Date   CARDIAC CATHETERIZATION N/A 11/10/2015   Procedure: Left Heart Cath and Coronary Angiography;  Surgeon: Tonny Bollman, MD;  Location: [facility] INVASIVE CV LAB;  Service: Cardiovascular;  Laterality: N/A;   CARDIAC CATHETERIZATION N/A 11/10/2015   Procedure: Coronary Stent Intervention;  Surgeon: Tonny Bollman, MD;  Location: [facility] INVASIVE CV LAB;  Service: Cardiovascular;  Laterality: N/A;  mid lad  3.0x18 resolute   CARDIAC CATHETERIZATION  10/09/2016   "scheduled OHS for tomorrow" (10/09/2016)   CARDIAC CATHETERIZATION N/A 10/09/2016   Procedure: Left Heart Cath and Coronary Angiography;  Surgeon: Tonny Bollman, MD;  Location: [facility] INVASIVE CV LAB;  Service: Cardiovascular;  Laterality: N/A;   CARDIAC CATHETERIZATION N/A 10/09/2016   Procedure: Coronary Balloon Angioplasty;  Surgeon: Tonny Bollman, MD;  Location: [facility] INVASIVE CV LAB;  Service: Cardiovascular;  Laterality: N/A;   CORONARY ARTERY BYPASS GRAFT N/A 10/15/2016   Procedure: CORONARY ARTERY BYPASS GRAFTING (CABG), ON PUMP, TIMES TWO, USING LEFT INTERNAL MAMMARY ARTERY AND RIGHT GREATER SAPHENOUS VEIN HARVESTED ENDOSCOPICALLY;  Surgeon: Kerin Perna, MD;  Location: [facility] OR;  Service: Open Heart Surgery;  Laterality: N/A;  LIMA to LAD, SVG to DIAGONAL   ESOPHAGOGASTRODUODENOSCOPY N/A 10/26/2012   Procedure: ESOPHAGOGASTRODUODENOSCOPY (EGD);  Surgeon: Vertell Novak., MD;  Location[facility] ENDOSCOPY;  Service: Endoscopy;  Laterality: N/A;  ESOPHAGOGASTRODUODENOSCOPY N/A 07/08/2013   Procedure: ESOPHAGOGASTRODUODENOSCOPY (EGD);  Surgeon: Vertell Novak., MD;  Location: [facility] ENDOSCOPY;  Service: Endoscopy;  Laterality: N/A;   INNER EAR SURGERY Left 1957   "related to ear infection"   REPAIR OF PATENT FORAMEN OVALE N/A 10/15/2016   Procedure: REPAIR OF PATENT FORAMEN OVALE;  Surgeon: Kerin Perna, MD;  Location: [facility] OR;  Service: Open Heart Surgery;  Laterality: N/A;   SAVORY DILATION N/A 07/08/2013   Procedure: SAVORY DILATION;   Surgeon: Vertell Novak., MD;  Location: [facility] ENDOSCOPY;  Service: Endoscopy;  Laterality: N/A;   TEE WITHOUT CARDIOVERSION N/A 07/28/2013   Procedure: TRANSESOPHAGEAL ECHOCARDIOGRAM (TEE);  Surgeon: Chrystie Nose, MD;  Location: [facility] ENDOSCOPY;  Service: Cardiovascular;  Laterality: N/A;   TEE WITHOUT CARDIOVERSION N/A 10/15/2016   Procedure: TRANSESOPHAGEAL ECHOCARDIOGRAM (TEE);  Surgeon: Kerin Perna, MD;  Location: [facility] OR;  Service: Open Heart Surgery;  Laterality: N/A;   TONSILLECTOMY  1950's   Social History   Socioeconomic History   Marital status: Married    Spouse name: Not on file   Number of children: 1   Years of education: Not on file   Highest education level: Not on file  Occupational History   Occupation: Retired  Tobacco Use   Smoking status: Never   Smokeless tobacco: Never  Vaping Use   Vaping Use: Never used  Substance and Sexual Activity   Alcohol use: Yes    Alcohol/week: 8.0 standard drinks of alcohol    Types: 4 Glasses of wine, 4 Cans of beer per week    Comment: weekly   Drug use: No   Sexual activity: Yes  Other Topics Concern   Not on file  Social History Narrative   Not on file   Social Determinants of Health   Financial Resource Strain: Low Risk  (04/06/2022)   Overall Financial Resource Strain (CARDIA)    Difficulty of Paying Living Expenses: Not hard at all  Food Insecurity: No Food Insecurity (04/06/2022)   Hunger Vital Sign    Worried About Running Out of Food in the Last Year: Never true    Ran Out of Food in the Last Year: Never true  Transportation Needs: No Transportation Needs (04/06/2022)   PRAPARE - Administrator, Civil Service (Medical): No    Lack of Transportation (Non-Medical): No  Physical Activity: Insufficiently Active (07/30/2022)   Exercise Vital Sign    Days of Exercise per Week: 2 days    Minutes of Exercise per Session: 10 min  Stress: Stress Concern Present (07/30/2022)   [employer] of  Occupational Health - Occupational Stress Questionnaire    Feeling of Stress : To some extent  Social Connections: Socially Integrated (04/06/2022)   Social Connection and Isolation Panel [NHANES]    Frequency of Communication with Friends and Family: Three times a week    Frequency of Social Gatherings with Friends and Family: Three times a week    Attends Religious Services: More than 4 times per year    Active Member of Clubs or Organizations: Yes    Attends Engineer, structural: More than 4 times per year    Marital Status: Married   Allergies  Allergen Reactions   Niacin Er Other (See Comments)   Statins Other (See Comments)   Atorvastatin Other (See Comments)    Muscle weakness   Crestor [Rosuvastatin] Other (See Comments)    Muscle weakness   Demerol [Meperidine] Nausea Only  Niacin And Related Itching, Rash and Other (See Comments)    Palms turned red and itched   Repatha [Evolocumab] Other (See Comments)    Muscle weakness   Family History  Problem Relation Age of Onset   Coronary artery disease Other        male first degree relative <60   Heart disease Father        heart attack   COPD Mother    Heart disease Paternal Uncle    Colon cancer Neg Hx     Current Outpatient Medications (Endocrine & Metabolic):    dapagliflozin propanediol (FARXIGA) 10 MG TABS tablet, Take 1 tablet (10 mg total) by mouth daily before breakfast.   levothyroxine (SYNTHROID) 50 MCG tablet, AS DIRECTED ORALLY ONCE A DAY X6 DAYX AND SKIP ON SUNDAYS 90 DAYS  Current Outpatient Medications (Cardiovascular):    Alirocumab (PRALUENT) 75 MG/ML SOAJ, INJECT 1 ML INTO THE SKIN EVERY 14 DAYS   sacubitril-valsartan (ENTRESTO) 24-26 MG, Take 1 tablet by mouth 2 (two) times daily.  Current Outpatient Medications (Respiratory):    budesonide-formoterol (SYMBICORT) 80-4.5 MCG/ACT inhaler, Inhale 2 puffs into the lungs daily.   montelukast (SINGULAIR) 10 MG tablet, TAKE 1 TABLET BY MOUTH  EVERYDAY AT BEDTIME  Current Outpatient Medications (Analgesics):    aspirin EC 81 MG tablet, Take 1 tablet (81 mg total) by mouth daily.  Current Outpatient Medications (Hematological):    clopidogrel (PLAVIX) 75 MG tablet, TAKE 1 TABLET BY MOUTH EVERY DAY   Ferrous Sulfate (IRON) 325 (65 Fe) MG TABS, Take 1 tablet by mouth. Taking three times a week  Current Outpatient Medications (Other):    ALPRAZolam (XANAX) 0.5 MG tablet, Take 0.5 mg by mouth as needed for anxiety (for flying).   Cholecalciferol 2000 UNITS CAPS, Take 2,000 Units by mouth every evening.   Diclofenac Sodium 1.5 % SOLN, Apply 1-2 Pump topically 2 (two) times daily as needed.   Turmeric 500 MG CAPS, Take 1 capsule by mouth at bedtime.   Reviewed prior external information including notes and imaging from  primary care provider As well as notes that were available from care everywhere and other healthcare systems.  Past medical history, social, surgical and family history all reviewed in electronic medical record.  No pertanent information unless stated regarding to the chief complaint.   Review of Systems:  No headache, visual changes, nausea, vomiting, diarrhea, constipation, dizziness, abdominal pain, skin rash, fevers, chills, night sweats, weight loss, swollen lymph nodes, body aches, joint swelling, chest pain, shortness of breath, mood changes. POSITIVE muscle aches  Objective  There were no vitals taken for this visit.   General: No apparent distress alert and oriented x3 mood and affect normal, dressed appropriately.  HEENT: Pupils equal, extraocular movements intact  Respiratory: Patient's speak in full sentences and does not appear short of breath  Cardiovascular: No lower extremity edema, non tender, no erythema      Impression and Recommendations:

## 2023-03-24 ENCOUNTER — Other Ambulatory Visit: Payer: Self-pay | Admitting: Cardiovascular Disease

## 2023-03-26 ENCOUNTER — Other Ambulatory Visit: Payer: Self-pay

## 2023-03-26 MED ORDER — ENTRESTO 24-26 MG PO TABS: 1.0000 | ORAL_TABLET | Freq: Two times a day (BID) | ORAL | 4 refills | Status: DC

## 2023-03-28 ENCOUNTER — Ambulatory Visit: Payer: Medicare Other | Admitting: Family Medicine

## 2023-03-31 ENCOUNTER — Other Ambulatory Visit: Payer: Self-pay | Admitting: Cardiovascular Disease

## 2023-05-17 ENCOUNTER — Ambulatory Visit: Payer: Medicare Other | Attending: Cardiovascular Disease | Admitting: Cardiovascular Disease

## 2023-05-17 ENCOUNTER — Encounter: Payer: Self-pay | Admitting: Cardiovascular Disease

## 2023-05-17 VITALS — BP 94/64 | HR 61 | Ht 73.0 in | Wt 179.8 lb

## 2023-05-17 DIAGNOSIS — E782 Mixed hyperlipidemia: Secondary | ICD-10-CM

## 2023-05-17 DIAGNOSIS — I25119 Atherosclerotic heart disease of native coronary artery with unspecified angina pectoris: Secondary | ICD-10-CM | POA: Insufficient documentation

## 2023-05-17 DIAGNOSIS — I255 Ischemic cardiomyopathy: Secondary | ICD-10-CM | POA: Diagnosis not present

## 2023-05-17 NOTE — Progress Notes (Unsigned)
Cardiology Office Note:    Date:  05/17/2023   ID:  Richard Ellis, DOB 10/25/1945, MRN [id number]  PCP:  Tresa Garter, MD   [facility] Providers Cardiologist:  Tonny Bollman, MD     Referring MD: Tresa Garter, MD   Chief Complaint  Patient presents with   Coronary Artery Disease    History of Present Illness:    Richard Ellis is a 77 y.o. male with a hx of Coronary artery disease Cath 2/17: Severe mid LAD stenosis >> DES to the mid LAD Status post CABG 2018 (LIMA-LAD, SVG-D1) Patent foramen ovale status post surgical closure Prior CVA Cryptogenic CVA 2014 >> PFO with R>L shunt noted; treated with Plavix Hyperlipidemia Intol of statins, PCSK9i  >> Diet Rx Hypothyroidism Anemia Bradycardia -no beta-blocker Ischemic cardiomyopathy: mild LV dysfunction LVEF 45-50%  The patient is here alone today.  Has been doing pretty well from a cardiac standpoint.  He had a rib fracture in June when he had a fall in the [place].  He feels like he has recovered from this pretty well now.  He is back to exercising regularly.  He went swimming this afternoon before his visit and had no exertional symptoms.  He denies any lightheadedness or presyncope.  He says that his blood pressure today is a little lower than normal, but attributes this to his post exercise state.  He denies exertional chest pain or pressure, shortness of breath, orthopnea, or PND.  He maintains a really good exercise regimen and is still competitively running 5K races.  Past Medical History:  Diagnosis Date   Allergy    rhinitis   Anemia    "Hgb always on the low side" (07/02/2013)   CAD (coronary artery disease)    a. LHC 2/17: pLAD 50, mLAD 95, pRCA 30, dRCA 25, EF 50-55% >> PCI:  3 x 18 mm Resolute DES to mLAD    Chronic bronchitis (HCC)    "not in the last year since I started taking allergy shots" (10/09/2016)   Chronic lower back pain    Chronic sinus bradycardia 07/02/2013   Coronary  artery disease 10/15/2016   Cryptogenic stroke (HCC) 07/02/2013   a. "small ischemic occipital right sided" (07/02/2013)  //  b. Event monitor 10/14: sinus brady  //  c. Carotid US 10/14: bilat ICA 1-39%   Esophageal stricture    Exertional angina 10/09/2016   Heart murmur    "as a child" (07/02/2013)   History of echocardiogram    a. Echo 10/14: EF 60-65%, no RWMA, normal diastolic function   Hyperlipidemia    Hypothyroidism    Nonallopathic lesion of lumbosacral region 06/04/2017   Nonallopathic lesion of sacral region 06/04/2017   Nonallopathic lesion of thoracic region 06/04/2017   Osteoarthritis    "lower back" (10/09/2016)   PFO (patent foramen ovale)    a. TEE 11/14: mild LVH, EF 60-65%, small PFO, R-L shunt    Past Surgical History:  Procedure Laterality Date   CARDIAC CATHETERIZATION N/A 11/10/2015   Procedure: Left Heart Cath and Coronary Angiography;  Surgeon: Tonny Bollman, MD;  Location: [facility] INVASIVE CV LAB;  Service: Cardiovascular;  Laterality: N/A;   CARDIAC CATHETERIZATION N/A 11/10/2015   Procedure: Coronary Stent Intervention;  Surgeon: Tonny Bollman, MD;  Location: [facility] INVASIVE CV LAB;  Service: Cardiovascular;  Laterality: N/A;  mid lad  3.0x18 resolute   CARDIAC CATHETERIZATION  10/09/2016   "scheduled OHS for tomorrow" (10/09/2016)   CARDIAC CATHETERIZATION  N/A 10/09/2016   Procedure: Left Heart Cath and Coronary Angiography;  Surgeon: Tonny Bollman, MD;  Location: [facility] INVASIVE CV LAB;  Service: Cardiovascular;  Laterality: N/A;   CARDIAC CATHETERIZATION N/A 10/09/2016   Procedure: Coronary Balloon Angioplasty;  Surgeon: Tonny Bollman, MD;  Location: [facility] INVASIVE CV LAB;  Service: Cardiovascular;  Laterality: N/A;   CORONARY ARTERY BYPASS GRAFT N/A 10/15/2016   Procedure: CORONARY ARTERY BYPASS GRAFTING (CABG), ON PUMP, TIMES TWO, USING LEFT INTERNAL MAMMARY ARTERY AND RIGHT GREATER SAPHENOUS VEIN HARVESTED ENDOSCOPICALLY;  Surgeon: Kerin Perna, MD;  Location: [facility] OR;   Service: Open Heart Surgery;  Laterality: N/A;  LIMA to LAD, SVG to DIAGONAL   ESOPHAGOGASTRODUODENOSCOPY N/A 10/26/2012   Procedure: ESOPHAGOGASTRODUODENOSCOPY (EGD);  Surgeon: Vertell Novak., MD;  Location: [facility] ENDOSCOPY;  Service: Endoscopy;  Laterality: N/A;   ESOPHAGOGASTRODUODENOSCOPY N/A 07/08/2013   Procedure: ESOPHAGOGASTRODUODENOSCOPY (EGD);  Surgeon: Vertell Novak., MD;  Location: [facility] ENDOSCOPY;  Service: Endoscopy;  Laterality: N/A;   INNER EAR SURGERY Left 1957   "related to ear infection"   REPAIR OF PATENT FORAMEN OVALE N/A 10/15/2016   Procedure: REPAIR OF PATENT FORAMEN OVALE;  Surgeon: Kerin Perna, MD;  Location: [facility] OR;  Service: Open Heart Surgery;  Laterality: N/A;   SAVORY DILATION N/A 07/08/2013   Procedure: SAVORY DILATION;  Surgeon: Vertell Novak., MD;  Location: [facility] ENDOSCOPY;  Service: Endoscopy;  Laterality: N/A;   TEE WITHOUT CARDIOVERSION N/A 07/28/2013   Procedure: TRANSESOPHAGEAL ECHOCARDIOGRAM (TEE);  Surgeon: Chrystie Nose, MD;  Location: [facility] ENDOSCOPY;  Service: Cardiovascular;  Laterality: N/A;   TEE WITHOUT CARDIOVERSION N/A 10/15/2016   Procedure: TRANSESOPHAGEAL ECHOCARDIOGRAM (TEE);  Surgeon: Kerin Perna, MD;  Location: [facility] OR;  Service: Open Heart Surgery;  Laterality: N/A;   TONSILLECTOMY  1950's    Current Medications: Current Meds  Medication Sig   Alirocumab (PRALUENT) 75 MG/ML SOAJ INJECT 1 ML INTO THE SKIN EVERY 14 DAYS   ALPRAZolam (XANAX) 0.5 MG tablet Take 0.5 mg by mouth as needed for anxiety (for flying).   Cholecalciferol 2000 UNITS CAPS Take 2,000 Units by mouth every evening.   clopidogrel (PLAVIX) 75 MG tablet TAKE 1 TABLET BY MOUTH EVERY DAY   dapagliflozin propanediol (FARXIGA) 10 MG TABS tablet Take 1 tablet (10 mg total) by mouth daily before breakfast.   Diclofenac Sodium 1.5 % SOLN Apply 1-2 Pump topically 2 (two) times daily as needed.   Ferrous Sulfate (IRON) 325 (65 Fe) MG TABS Take 1 tablet by mouth. Taking  three times a week   levothyroxine (SYNTHROID) 50 MCG tablet AS DIRECTED ORALLY ONCE A DAY X6 DAYX AND SKIP ON SUNDAYS 90 DAYS   montelukast (SINGULAIR) 10 MG tablet TAKE 1 TABLET BY MOUTH EVERYDAY AT BEDTIME   sacubitril-valsartan (ENTRESTO) 24-26 MG Take 1 tablet by mouth 2 (two) times daily.   Turmeric 500 MG CAPS Take 1 capsule by mouth at bedtime.   [DISCONTINUED] aspirin EC 81 MG tablet Take 1 tablet (81 mg total) by mouth daily.   [DISCONTINUED] budesonide-formoterol (SYMBICORT) 80-4.5 MCG/ACT inhaler Inhale 2 puffs into the lungs daily.     Allergies:   Niacin er, Statins, Atorvastatin, Crestor [rosuvastatin], Demerol [meperidine], Niacin and related, and Repatha [evolocumab]   Social History   Socioeconomic History   Marital status: Married    Spouse name: Not on file   Number of children: 1   Years of education: Not on file   Highest education level: Not on file  Occupational History   Occupation: Retired  Tobacco Use   Smoking status: Never   Smokeless tobacco: Never  Vaping Use   Vaping status: Never Used  Substance and Sexual Activity   Alcohol use: Yes    Alcohol/week: 8.0 standard drinks of alcohol    Types: 4 Glasses of wine, 4 Cans of beer per week    Comment: weekly   Drug use: No   Sexual activity: Yes  Other Topics Concern   Not on file  Social History Narrative   Not on file   Social Determinants of Health   Financial Resource Strain: Low Risk  (04/06/2022)   Overall Financial Resource Strain (CARDIA)    Difficulty of Paying Living Expenses: Not hard at all  Food Insecurity: No Food Insecurity (04/06/2022)   Hunger Vital Sign    Worried About Running Out of Food in the Last Year: Never true    Ran Out of Food in the Last Year: Never true  Transportation Needs: No Transportation Needs (04/06/2022)   PRAPARE - Administrator, Civil Service (Medical): No    Lack of Transportation (Non-Medical): No  Physical Activity: Insufficiently Active  (07/30/2022)   Exercise Vital Sign    Days of Exercise per Week: 2 days    Minutes of Exercise per Session: 10 min  Stress: Stress Concern Present (07/30/2022)   [employer] of Occupational Health - Occupational Stress Questionnaire    Feeling of Stress : To some extent  Social Connections: Socially Integrated (04/06/2022)   Social Connection and Isolation Panel [NHANES]    Frequency of Communication with Friends and Family: Three times a week    Frequency of Social Gatherings with Friends and Family: Three times a week    Attends Religious Services: More than 4 times per year    Active Member of Clubs or Organizations: Yes    Attends Engineer, structural: More than 4 times per year    Marital Status: Married     Family History: The patient's family history includes COPD in his mother; Coronary artery disease in an other family member; Heart disease in his father and paternal uncle. There is no history of Colon cancer.  ROS:   Please see the history of present illness.    All other systems reviewed and are negative.  EKGs/Labs/Other Studies Reviewed:    The following studies were reviewed today: EKG Interpretation Date/Time:  Friday May 17 2023 15:47:06 EDT Ventricular Rate:  61 PR Interval:  180 QRS Duration:  104 QT Interval:  420 QTC Calculation: 422 R Axis:   108  Text Interpretation: Normal sinus rhythm Incomplete right bundle branch block No significant change since last tracing Confirmed by Tonny Bollman [phone]) on 05/17/2023 3:59:20 PM    Myoview Scan 09/27/2021:   The study is normal. The study is intermediate risk.   No ST deviation was noted.   LV perfusion is normal. There is no evidence of ischemia. There is no evidence of infarction.   Left ventricular function is normal. Nuclear stress EF: 38 %. The left ventricular ejection fraction is moderately decreased (30-44%). End diastolic cavity size is normal.   Prior study not available for  comparison.   Reduced apical counts with normal wall motion consistent with apical thinning artifact.  Normal study without ischemia or infarction.  Moderately reduced LVEF=38%. Global hypokinesis with postoperative septal movement. Visually, appears closer to 50% which was seen on echo recently.  Excellent exercise capacity (11:02 min:s; 13.4 METS).  Normal HR/BP response to exercise.  This is an intermediate risk study based on EF.   Echo 07/31/2022: 1. Left ventricular ejection fraction, by estimation, is 45 to 50%. The  left ventricle has mildly decreased function. There is mild left  ventricular hypertrophy. Left ventricular diastolic parameters are  indeterminate.   2. Right ventricular systolic function is mildly reduced. The right  ventricular size is normal. There is normal pulmonary artery systolic  pressure.   3. Left atrial size was mildly dilated.   4. No evidence of mitral valve regurgitation.   5. Aortic valve regurgitation is not visualized.   6. Aneurysm of the ascending aorta, measuring 41 mm.   7. The inferior vena cava is normal in size with greater than 50%  respiratory variability, suggesting right atrial pressure of 3 mmHg.   Comparison(s): No significant change from prior study.   Recent Labs: 06/12/2022: ALT 15; BUN 28; Creatinine, Ser 1.07; Hemoglobin 13.3; Platelets 188.0; Potassium 4.3; Sodium 137; TSH 2.21  Recent Lipid Panel    Component Value Date/Time   CHOL 187 06/12/2022 1512   CHOL 184 12/21/2021 1503   CHOL 230 (H) 11/09/2014 1200   TRIG 158.0 (H) 06/12/2022 1512   TRIG 152 (H) 11/09/2014 1200   TRIG 228 (HH) 07/30/2006 0741   HDL 49.00 06/12/2022 1512   HDL 47 12/21/2021 1503   HDL 64 11/09/2014 1200   CHOLHDL 4 06/12/2022 1512   VLDL 31.6 06/12/2022 1512   LDLCALC 106 (H) 06/12/2022 1512   LDLCALC 97 12/21/2021 1503   LDLCALC 136 (H) 11/09/2014 1200   LDLDIRECT 106 (H) 12/21/2021 1503   LDLDIRECT 122.0 05/09/2015 1006     Risk  Assessment/Calculations:                Physical Exam:    VS:  BP 94/64   Pulse 61   Ht 6\' 1"  (1.854 m)   Wt 179 lb 12.8 oz (81.6 kg)   SpO2 96%   BMI 23.72 kg/m     Wt Readings from Last 3 Encounters:  05/17/23 179 lb 12.8 oz (81.6 kg)  01/22/23 175 lb (79.4 kg)  11/20/22 178 lb (80.7 kg)     GEN:  Well nourished, well developed in no acute distress HEENT: Normal NECK: No JVD; No carotid bruits LYMPHATICS: No lymphadenopathy CARDIAC: RRR, no murmurs, rubs, gallops RESPIRATORY:  Clear to auscultation without rales, wheezing or rhonchi  ABDOMEN: Soft, non-tender, non-distended MUSCULOSKELETAL:  No edema; No deformity  SKIN: Warm and dry NEUROLOGIC:  Alert and oriented x 3 PSYCHIATRIC:  Normal affect   ASSESSMENT:    1. Coronary artery disease involving native coronary artery of native heart with angina pectoris (HCC)   2. Mixed hyperlipidemia   3. Ischemic cardiomyopathy    PLAN:    In order of problems listed above:  The patient is doing very well and is stable without symptoms of angina at an excellent workload.  I am going to simplify his antiplatelet medications by having him discontinue aspirin.  He will remain on clopidogrel.  I think this will reduce his long-term bleeding risk and antiplatelet monotherapy is appropriate at this point.  Otherwise no medication changes are made today. Treated with Praluent.  Patient is statin intolerant. The patient has mild LV dysfunction with LVEF 45 to 50%.  He has no symptoms of heart failure.  States that he is still able to run a 25-minute 5K.  He is treated with Sherryll Burger and [place].  Considering his  blood pressure, he would not be able to tolerate any escalation of his medical therapy at this point.  Overall the patient appears clinically stable from a cardiac perspective.  He tells me that his smart watch is giving him readings of a heart rate of 180 bpm with exercise on occasion.  I offered him a 3-day ZIO monitor to  evaluate this further, but he declines at present.  He might call in if he becomes more symptomatic and if that occurs, I would recommend ordering the ZIO monitor.      Medication Adjustments/Labs and Tests Ordered: Current medicines are reviewed at length with the patient today.  Concerns regarding medicines are outlined above.  Orders Placed This Encounter  Procedures   EKG 12-Lead   No orders of the defined types were placed in this encounter.   Patient Instructions  Medication Instructions:  STOP Aspirin *If you need a refill on your cardiac medications before your next appointment, please call your pharmacy*   Lab Work: NONE If you have labs (blood work) drawn today and your tests are completely normal, you will receive your results only by: MyChart Message (if you have MyChart) OR A paper copy in the mail If you have any lab test that is abnormal or we need to change your treatment, we will call you to review the results.   Testing/Procedures: NONE   Follow-Up: At [facility], you and your health needs are our priority.  As part of our continuing mission to provide you with exceptional heart care, we have created designated Provider Care Teams.  These Care Teams include your primary Cardiologist (physician) and Advanced Practice Providers (APPs -  Physician Assistants and Nurse Practitioners) who all work together to provide you with the care you need, when you need it.  We recommend signing up for the patient portal called "MyChart".  Sign up information is provided on this After Visit Summary.  MyChart is used to connect with patients for Virtual Visits (Telemedicine).  Patients are able to view lab/test results, encounter notes, upcoming appointments, etc.  Non-urgent messages can be sent to your provider as well.   To learn more about what you can do with MyChart, go to [email or web address].    Your next appointment:   1 year(s)  Provider:   Tonny Bollman, MD        Signed, Tonny Bollman, MD  05/17/2023 4:11 PM    [facility]

## 2023-05-17 NOTE — Patient Instructions (Signed)
Medication Instructions:  STOP Aspirin *If you need a refill on your cardiac medications before your next appointment, please call your pharmacy*   Lab Work: NONE If you have labs (blood work) drawn today and your tests are completely normal, you will receive your results only by: MyChart Message (if you have MyChart) OR A paper copy in the mail If you have any lab test that is abnormal or we need to change your treatment, we will call you to review the results.   Testing/Procedures: NONE   Follow-Up: At [facility], you and your health needs are our priority.  As part of our continuing mission to provide you with exceptional heart care, we have created designated Provider Care Teams.  These Care Teams include your primary Cardiologist (physician) and Advanced Practice Providers (APPs -  Physician Assistants and Nurse Practitioners) who all work together to provide you with the care you need, when you need it.  We recommend signing up for the patient portal called "MyChart".  Sign up information is provided on this After Visit Summary.  MyChart is used to connect with patients for Virtual Visits (Telemedicine).  Patients are able to view lab/test results, encounter notes, upcoming appointments, etc.  Non-urgent messages can be sent to your provider as well.   To learn more about what you can do with MyChart, go to [email or web address].    Your next appointment:   1 year(s)  Provider:   Tonny Bollman, MD

## 2023-05-27 DIAGNOSIS — Z23 Encounter for immunization: Secondary | ICD-10-CM | POA: Diagnosis not present

## 2023-06-11 ENCOUNTER — Ambulatory Visit: Payer: Medicare Other

## 2023-06-11 VITALS — Ht 73.0 in | Wt 177.0 lb

## 2023-06-11 DIAGNOSIS — Z0000 Encounter for general adult medical examination without abnormal findings: Secondary | ICD-10-CM | POA: Diagnosis not present

## 2023-06-11 NOTE — Progress Notes (Cosign Needed)
Subjective:   Richard Ellis is a 77 y.o. male who presents for Medicare Annual/Subsequent preventive examination.  Visit Complete: Virtual  I connected with  Richard Ellis on 06/11/23 by a audio enabled telemedicine application and verified that I am speaking with the correct person using two identifiers.  Patient Location: Home  Provider Location: Home Office  I discussed the limitations of evaluation and management by telemedicine. The patient expressed understanding and agreed to proceed.  Patient Medicare AWV questionnaire was completed by the patient on 06/04/2023; I have confirmed that all information answered by patient is correct and no changes since this date.  Vital Signs: Because this visit was a virtual/telehealth visit, some criteria may be missing or patient reported. Any vitals not documented were not able to be obtained and vitals that have been documented are patient reported.      Cardiac Risk Factors include: advanced age (>12men, >63 women);hypertension;Other (see comment);dyslipidemia, Risk factor comments: CAD, TIA,     Objective:    Today's Vitals   06/11/23 1339  Weight: 177 lb (80.3 kg)  Height: 6\' 1"  (1.854 m)   Body mass index is 23.35 kg/m.     06/11/2023    1:51 PM 04/06/2022    3:34 PM 08/21/2021    8:29 PM 03/30/2021    9:56 AM 10/11/2016    8:19 AM 10/09/2016    1:05 PM 11/10/2015   10:33 AM  Advanced Directives  Does Patient Have a Medical Advance Directive? Yes Yes Yes Yes Yes Yes Yes  Type of Estate agent of [place];Living will Healthcare Power of [place];Living will Living will;Healthcare Power of Attorney Living will;Healthcare Power of [employer] Power of [place];Living will Healthcare Power of Attorney  Does patient want to make changes to medical advance directive?    No - Patient declined  No - Patient declined   Copy of Healthcare Power of Attorney in Chart? No - copy requested No - copy requested  No  - copy requested  No - copy requested No - copy requested    Current Medications (verified) Outpatient Encounter Medications as of 06/11/2023  Medication Sig   Alirocumab (PRALUENT) 75 MG/ML SOAJ INJECT 1 ML INTO THE SKIN EVERY 14 DAYS   ALPRAZolam (XANAX) 0.5 MG tablet Take 0.5 mg by mouth as needed for anxiety (for flying).   Cholecalciferol 2000 UNITS CAPS Take 2,000 Units by mouth every evening.   clopidogrel (PLAVIX) 75 MG tablet TAKE 1 TABLET BY MOUTH EVERY DAY   dapagliflozin propanediol (FARXIGA) 10 MG TABS tablet Take 1 tablet (10 mg total) by mouth daily before breakfast.   Diclofenac Sodium 1.5 % SOLN Apply 1-2 Pump topically 2 (two) times daily as needed.   Ferrous Sulfate (IRON) 325 (65 Fe) MG TABS Take 1 tablet by mouth. Taking three times a week   levothyroxine (SYNTHROID) 50 MCG tablet AS DIRECTED ORALLY ONCE A DAY X6 DAYX AND SKIP ON SUNDAYS 90 DAYS   montelukast (SINGULAIR) 10 MG tablet TAKE 1 TABLET BY MOUTH EVERYDAY AT BEDTIME   sacubitril-valsartan (ENTRESTO) 24-26 MG Take 1 tablet by mouth 2 (two) times daily.   Turmeric 500 MG CAPS Take 1 capsule by mouth at bedtime.   No facility-administered encounter medications on file as of 06/11/2023.    Allergies (verified) Niacin er, Statins, Atorvastatin, Crestor [rosuvastatin], Demerol [meperidine], Niacin and related, and Repatha [evolocumab]   History: Past Medical History:  Diagnosis Date   Allergy    rhinitis  Anemia    "Hgb always on the low side" (07/02/2013)   CAD (coronary artery disease)    a. LHC 2/17: pLAD 50, mLAD 95, pRCA 30, dRCA 25, EF 50-55% >> PCI:  3 x 18 mm Resolute DES to mLAD    Chronic bronchitis (HCC)    "not in the last year since I started taking allergy shots" (10/09/2016)   Chronic lower back pain    Chronic sinus bradycardia 07/02/2013   Coronary artery disease 10/15/2016   Cryptogenic stroke (HCC) 07/02/2013   a. "small ischemic occipital right sided" (07/02/2013)  //  b. Event monitor  10/14: sinus brady  //  c. Carotid US 10/14: bilat ICA 1-39%   Esophageal stricture    Exertional angina 10/09/2016   Heart murmur    "as a child" (07/02/2013)   History of echocardiogram    a. Echo 10/14: EF 60-65%, no RWMA, normal diastolic function   Hyperlipidemia    Hypothyroidism    Nonallopathic lesion of lumbosacral region 06/04/2017   Nonallopathic lesion of sacral region 06/04/2017   Nonallopathic lesion of thoracic region 06/04/2017   Osteoarthritis    "lower back" (10/09/2016)   PFO (patent foramen ovale)    a. TEE 11/14: mild LVH, EF 60-65%, small PFO, R-L shunt   Past Surgical History:  Procedure Laterality Date   CARDIAC CATHETERIZATION N/A 11/10/2015   Procedure: Left Heart Cath and Coronary Angiography;  Surgeon: Tonny Bollman, MD;  Location: [facility] INVASIVE CV LAB;  Service: Cardiovascular;  Laterality: N/A;   CARDIAC CATHETERIZATION N/A 11/10/2015   Procedure: Coronary Stent Intervention;  Surgeon: Tonny Bollman, MD;  Location: [facility] INVASIVE CV LAB;  Service: Cardiovascular;  Laterality: N/A;  mid lad  3.0x18 resolute   CARDIAC CATHETERIZATION  10/09/2016   "scheduled OHS for tomorrow" (10/09/2016)   CARDIAC CATHETERIZATION N/A 10/09/2016   Procedure: Left Heart Cath and Coronary Angiography;  Surgeon: Tonny Bollman, MD;  Location: [facility] INVASIVE CV LAB;  Service: Cardiovascular;  Laterality: N/A;   CARDIAC CATHETERIZATION N/A 10/09/2016   Procedure: Coronary Balloon Angioplasty;  Surgeon: Tonny Bollman, MD;  Location: [facility] INVASIVE CV LAB;  Service: Cardiovascular;  Laterality: N/A;   CORONARY ARTERY BYPASS GRAFT N/A 10/15/2016   Procedure: CORONARY ARTERY BYPASS GRAFTING (CABG), ON PUMP, TIMES TWO, USING LEFT INTERNAL MAMMARY ARTERY AND RIGHT GREATER SAPHENOUS VEIN HARVESTED ENDOSCOPICALLY;  Surgeon: Kerin Perna, MD;  Location: [facility] OR;  Service: Open Heart Surgery;  Laterality: N/A;  LIMA to LAD, SVG to DIAGONAL   ESOPHAGOGASTRODUODENOSCOPY N/A 10/26/2012   Procedure:  ESOPHAGOGASTRODUODENOSCOPY (EGD);  Surgeon: Vertell Novak., MD;  Location: [facility] ENDOSCOPY;  Service: Endoscopy;  Laterality: N/A;   ESOPHAGOGASTRODUODENOSCOPY N/A 07/08/2013   Procedure: ESOPHAGOGASTRODUODENOSCOPY (EGD);  Surgeon: Vertell Novak., MD;  Location: [facility] ENDOSCOPY;  Service: Endoscopy;  Laterality: N/A;   INNER EAR SURGERY Left 1957   "related to ear infection"   REPAIR OF PATENT FORAMEN OVALE N/A 10/15/2016   Procedure: REPAIR OF PATENT FORAMEN OVALE;  Surgeon: Kerin Perna, MD;  Location: [facility] OR;  Service: Open Heart Surgery;  Laterality: N/A;   SAVORY DILATION N/A 07/08/2013   Procedure: SAVORY DILATION;  Surgeon: Vertell Novak., MD;  Location: [facility] ENDOSCOPY;  Service: Endoscopy;  Laterality: N/A;   TEE WITHOUT CARDIOVERSION N/A 07/28/2013   Procedure: TRANSESOPHAGEAL ECHOCARDIOGRAM (TEE);  Surgeon: Chrystie Nose, MD;  Location: [facility] ENDOSCOPY;  Service: Cardiovascular;  Laterality: N/A;   TEE WITHOUT CARDIOVERSION N/A 10/15/2016   Procedure: TRANSESOPHAGEAL ECHOCARDIOGRAM (TEE);  Surgeon: Kerin Perna, MD;  Location: [facility] OR;  Service: Open Heart Surgery;  Laterality: N/A;   TONSILLECTOMY  1950's   Family History  Problem Relation Age of Onset   Coronary artery disease Other        male first degree relative <60   Heart disease Father        heart attack   COPD Mother    Heart disease Paternal Uncle    Colon cancer Neg Hx    Social History   Socioeconomic History   Marital status: Married    Spouse name: Rayfield Citizen   Number of children: 1   Years of education: Not on file   Highest education level: Not on file  Occupational History   Occupation: Retired  Tobacco Use   Smoking status: Never   Smokeless tobacco: Never  Vaping Use   Vaping status: Never Used  Substance and Sexual Activity   Alcohol use: Yes    Alcohol/week: 8.0 standard drinks of alcohol    Types: 4 Glasses of wine, 4 Cans of beer per week    Comment: weekly   Drug use: No    Sexual activity: Yes  Other Topics Concern   Not on file  Social History Narrative   Lives with wife   Social Determinants of Health   Financial Resource Strain: Low Risk  (06/04/2023)   Overall Financial Resource Strain (CARDIA)    Difficulty of Paying Living Expenses: Not hard at all  Food Insecurity: No Food Insecurity (06/04/2023)   Hunger Vital Sign    Worried About Running Out of Food in the Last Year: Never true    Ran Out of Food in the Last Year: Never true  Transportation Needs: No Transportation Needs (06/04/2023)   PRAPARE - Administrator, Civil Service (Medical): No    Lack of Transportation (Non-Medical): No  Physical Activity: Sufficiently Active (06/04/2023)   Exercise Vital Sign    Days of Exercise per Week: 6 days    Minutes of Exercise per Session: 40 min  Stress: No Stress Concern Present (06/04/2023)   [employer] of Occupational Health - Occupational Stress Questionnaire    Feeling of Stress : Not at all  Social Connections: Unknown (06/04/2023)   Social Connection and Isolation Panel [NHANES]    Frequency of Communication with Friends and Family: Twice a week    Frequency of Social Gatherings with Friends and Family: Once a week    Attends Religious Services: Not on Marketing executive or Organizations: Yes    Attends Engineer, structural: More than 4 times per year    Marital Status: Married    Tobacco Counseling Counseling given: Not Answered   Clinical Intake:  Pre-visit preparation completed: Yes  Pain : No/denies pain     BMI - recorded: 23.35 Nutritional Status: BMI of 19-24  Normal Nutritional Risks: None  How often do you need to have someone help you when you read instructions, pamphlets, or other written materials from your doctor or pharmacy?: (P) 1 - Never  Interpreter Needed?: No  Information entered by :: Ladona Rosten, RMA   Activities of Daily Living    06/04/2023    2:29 PM  In your  present state of health, do you have any difficulty performing the following activities:  Hearing? 0  Vision? 0  Difficulty concentrating or making decisions? 0  Walking or climbing stairs? 0  Dressing or bathing? 0  Doing  errands, shopping? 0  Preparing Food and eating ? N  Using the Toilet? N  In the past six months, have you accidently leaked urine? N  Do you have problems with loss of bowel control? N  Managing your Medications? N  Managing your Finances? N  Housekeeping or managing your Housekeeping? N    Patient Care Team: Plotnikov, Georgina Quint, MD as PCP - Jerelene Redden, MD as PCP - Cardiology (Cardiology) Barron Alvine, MD (Inactive) as Consulting Physician (Urology) Ander Purpura, OD as Consulting Physician (Optometry)  Indicate any recent Medical Services you may have received from other than [facility] providers in the past year (date may be approximate).     Assessment:   This is a routine wellness examination for [place].  Hearing/Vision screen Hearing Screening - Comments:: Denies hearing difficulties   Vision Screening - Comments:: Wears eyeglasses   Goals Addressed             This Visit's Progress    Patient Stated   On track    My goal for this year is to compete in the state and national finals.      Depression Screen    06/11/2023    1:54 PM 04/06/2022    3:37 PM 04/06/2022    3:34 PM 04/06/2022    3:33 PM 11/14/2021    1:40 PM 03/30/2021   10:12 AM 01/12/2021    2:17 PM  PHQ 2/9 Scores  PHQ - 2 Score 0 0 0 0 0 0 0  PHQ- 9 Score 0      0    Fall Risk    06/04/2023    2:29 PM 04/06/2022    3:34 PM 11/14/2021    1:40 PM 03/30/2021   10:15 AM 01/12/2021    2:18 PM  Fall Risk   Falls in the past year? 0 0 0 0 0  Number falls in past yr: 0 0 0 0 0  Injury with Fall? 0 0 0 0 0  Risk for fall due to :    No Fall Risks No Fall Risks  Follow up Falls evaluation completed;Falls prevention discussed Falls evaluation completed;Education provided   Falls evaluation completed     MEDICARE RISK AT HOME: Medicare Risk at Home Any stairs in or around the home?: Yes If so, are there any without handrails?: No Home free of loose throw rugs in walkways, pet beds, electrical cords, etc?: Yes Adequate lighting in your home to reduce risk of falls?: Yes Life alert?: No Use of a cane, walker or w/c?: No Grab bars in the bathroom?: No Shower chair or bench in shower?: No Elevated toilet seat or a handicapped toilet?: No  TIMED UP AND GO:  Was the test performed?  No    Cognitive Function:        06/11/2023    1:52 PM  6CIT Screen  What Year? 0 points  What month? 0 points  What time? 0 points  Count back from 20 0 points  Months in reverse 0 points  Repeat phrase 0 points  Total Score 0 points    Immunizations Immunization History  Administered Date(s) Administered   DTaP 02/03/2011   Fluad Quad(high Dose 65+) 05/17/2020, 06/14/2022   Influenza, High Dose Seasonal PF 07/20/2016, 05/29/2017, 05/29/2018, 05/28/2019, 06/17/2021   Influenza, Quadrivalent, Recombinant, Inj, Pf 05/24/2020   Influenza-Unspecified 06/23/2015   PFIZER(Purple Top)SARS-COV-2 Vaccination 10/08/2019, 10/29/2019, 07/06/2020, 04/12/2021   Pneumococcal Conjugate-13 04/19/2016   Pneumococcal Polysaccharide-23 08/11/2012   Respiratory  Syncytial Virus Vaccine,Recomb Aduvanted(Arexvy) 06/27/2022   Tdap 11/18/2006    TDAP status: Due, Education has been provided regarding the importance of this vaccine. Advised may receive this vaccine at local pharmacy or Health Dept. Aware to provide a copy of the vaccination record if obtained from local pharmacy or Health Dept. Verbalized acceptance and understanding.  Flu Vaccine status: Up to date  Pneumococcal vaccine status: Up to date  Covid-19 vaccine status: Completed vaccines  Qualifies for Shingles Vaccine? Yes   Zostavax completed No   Shingrix Completed?: No.    Education has been provided regarding the  importance of this vaccine. Patient has been advised to call insurance company to determine out of pocket expense if they have not yet received this vaccine. Advised may also receive vaccine at local pharmacy or Health Dept. Verbalized acceptance and understanding.  Screening Tests Health Maintenance  Topic Date Due   Hepatitis C Screening  Never done   Zoster Vaccines- Shingrix (1 of 2) Never done   DTaP/Tdap/Td (3 - Td or Tdap) 02/02/2021   COVID-19 Vaccine (5 - 2023-24 season) 05/19/2023   Medicare Annual Wellness (AWV)  06/10/2024   Pneumonia Vaccine 3+ Years old  Completed   INFLUENZA VACCINE  Completed   HPV VACCINES  Aged Out   Colonoscopy  Discontinued    Health Maintenance  Health Maintenance Due  Topic Date Due   Hepatitis C Screening  Never done   Zoster Vaccines- Shingrix (1 of 2) Never done   DTaP/Tdap/Td (3 - Td or Tdap) 02/02/2021   COVID-19 Vaccine (5 - 2023-24 season) 05/19/2023    Colorectal cancer screening: No longer required.   Lung Cancer Screening: (Low Dose CT Chest recommended if Age 7-80 years, 20 pack-year currently smoking OR have quit w/in 15years.) does not qualify.   Lung Cancer Screening Referral: N/A  Additional Screening:  Hepatitis C Screening: does qualify;   Vision Screening: Recommended annual ophthalmology exams for early detection of glaucoma and other disorders of the eye. Is the patient up to date with their annual eye exam?  Yes  Who is the provider or what is the name of the office in which the patient attends annual eye exams? [facility] If pt is not established with a provider, would they like to be referred to a provider to establish care? No .   Dental Screening: Recommended annual dental exams for proper oral hygiene   Community Resource Referral / Chronic Care Management: CRR required this visit?  No   CCM required this visit?  No     Plan:     I have personally reviewed and noted the following in the  patient's chart:   Medical and social history Use of alcohol, tobacco or illicit drugs  Current medications and supplements including opioid prescriptions. Patient is not currently taking opioid prescriptions. Functional ability and status Nutritional status Physical activity Advanced directives List of other physicians Hospitalizations, surgeries, and ER visits in previous 12 months Vitals Screenings to include cognitive, depression, and falls Referrals and appointments  In addition, I have reviewed and discussed with patient certain preventive protocols, quality metrics, and best practice recommendations. A written personalized care plan for preventive services as well as general preventive health recommendations were provided to patient.     Falan Hensler L Malory Spurr, CMA   06/11/2023   After Visit Summary: (MyChart) Due to this being a telephonic visit, the after visit summary with patients personalized plan was offered to patient via MyChart   Nurse  Notes: Patient is due for a Shingrix vaccine and has never had a Hep C screening.  Patient stated that he declines Covid vaccine. He had no other concerns to address today.   Medical screening examination/treatment/procedure(s) were performed by non-physician practitioner and as supervising physician I was immediately available for consultation/collaboration.  I agree with above. Jacinta Shoe, MD

## 2023-06-11 NOTE — Patient Instructions (Addendum)
Richard Ellis , Thank you for taking time to come for your Medicare Wellness Visit. I appreciate your ongoing commitment to your health goals. Please review the following plan we discussed and let me know if I can assist you in the future.   Referrals/Orders/Follow-Ups/Clinician Recommendations: You are due for a Shingles vaccine and a Hep C screening.  Keep up the good work.  This is a list of the screening recommended for you and due dates:  Health Maintenance  Topic Date Due   Hepatitis C Screening  Never done   Zoster (Shingles) Vaccine (1 of 2) Never done   DTaP/Tdap/Td vaccine (3 - Td or Tdap) 02/02/2021   COVID-19 Vaccine (5 - 2023-24 season) 05/19/2023   Medicare Annual Wellness Visit  06/10/2024   Pneumonia Vaccine  Completed   Flu Shot  Completed   HPV Vaccine  Aged Out   Colon Cancer Screening  Discontinued    Advanced directives: (Copy Requested) Please bring a copy of your health care power of attorney and living will to the office to be added to your chart at your convenience.  Next Medicare Annual Wellness Visit scheduled for next year: Yes

## 2023-06-17 ENCOUNTER — Encounter: Payer: Self-pay | Admitting: Internal Medicine

## 2023-06-17 ENCOUNTER — Ambulatory Visit (INDEPENDENT_AMBULATORY_CARE_PROVIDER_SITE_OTHER): Payer: Medicare Other | Admitting: Internal Medicine

## 2023-06-17 VITALS — BP 100/60 | HR 61 | Temp 97.6°F | Ht 73.0 in | Wt 177.8 lb

## 2023-06-17 DIAGNOSIS — I25810 Atherosclerosis of coronary artery bypass graft(s) without angina pectoris: Secondary | ICD-10-CM

## 2023-06-17 DIAGNOSIS — R972 Elevated prostate specific antigen [PSA]: Secondary | ICD-10-CM | POA: Diagnosis not present

## 2023-06-17 DIAGNOSIS — E039 Hypothyroidism, unspecified: Secondary | ICD-10-CM | POA: Diagnosis not present

## 2023-06-17 DIAGNOSIS — E782 Mixed hyperlipidemia: Secondary | ICD-10-CM | POA: Diagnosis not present

## 2023-06-17 DIAGNOSIS — E785 Hyperlipidemia, unspecified: Secondary | ICD-10-CM

## 2023-06-17 DIAGNOSIS — R5383 Other fatigue: Secondary | ICD-10-CM

## 2023-06-17 NOTE — Assessment & Plan Note (Signed)
On Levothyroxine Monitor TSH

## 2023-06-17 NOTE — Assessment & Plan Note (Signed)
On Repatha 

## 2023-06-17 NOTE — Assessment & Plan Note (Signed)
Will ref to see Dr. Burt Knack to consider a biweekly injection of Repatha

## 2023-06-17 NOTE — Assessment & Plan Note (Signed)
Age related

## 2023-06-17 NOTE — Progress Notes (Signed)
Subjective:  Patient ID: Richard Ellis, male    DOB: [birth year]  Age: 77 y.o. MRN: [id number]  CC: Medical Management of Chronic Issues (No concerns )   HPI Richard Ellis presents for a f/u CAD, anxiety, hypothyroidism  Outpatient Medications Prior to Visit  Medication Sig Dispense Refill   Alirocumab (PRALUENT) 75 MG/ML SOAJ INJECT 1 ML INTO THE SKIN EVERY 14 DAYS 2 mL 11   ALPRAZolam (XANAX) 0.5 MG tablet Take 0.5 mg by mouth as needed for anxiety (for flying).     Cholecalciferol 2000 UNITS CAPS Take 2,000 Units by mouth every evening.     clopidogrel (PLAVIX) 75 MG tablet TAKE 1 TABLET BY MOUTH EVERY DAY 90 tablet 0   dapagliflozin propanediol (FARXIGA) 10 MG TABS tablet Take 1 tablet (10 mg total) by mouth daily before breakfast. 30 tablet 11   Diclofenac Sodium 1.5 % SOLN Apply 1-2 Pump topically 2 (two) times daily as needed. 300 mL 0   Ferrous Sulfate (IRON) 325 (65 Fe) MG TABS Take 1 tablet by mouth. Taking three times a week     levothyroxine (SYNTHROID) 50 MCG tablet AS DIRECTED ORALLY ONCE A DAY X6 DAYX AND SKIP ON SUNDAYS 90 DAYS 90 tablet 2   montelukast (SINGULAIR) 10 MG tablet TAKE 1 TABLET BY MOUTH EVERYDAY AT BEDTIME 90 tablet 2   sacubitril-valsartan (ENTRESTO) 24-26 MG Take 1 tablet by mouth 2 (two) times daily. 60 tablet 4   Turmeric 500 MG CAPS Take 1 capsule by mouth at bedtime.     No facility-administered medications prior to visit.    ROS: Review of Systems  Constitutional:  Negative for appetite change, fatigue and unexpected weight change.  HENT:  Negative for congestion, nosebleeds, sneezing, sore throat and trouble swallowing.   Eyes:  Negative for itching and visual disturbance.  Respiratory:  Negative for cough.   Cardiovascular:  Negative for chest pain, palpitations and leg swelling.  Gastrointestinal:  Negative for abdominal distention, blood in stool, diarrhea and nausea.  Genitourinary:  Negative for frequency and hematuria.  Musculoskeletal:   Negative for back pain, gait problem, joint swelling and neck pain.  Skin:  Negative for rash.  Neurological:  Negative for dizziness, tremors, speech difficulty and weakness.  Psychiatric/Behavioral:  Negative for agitation, dysphoric mood and sleep disturbance. The patient is not nervous/anxious.     Objective:  BP 100/60 (BP Location: Left Arm, Patient Position: Sitting, Cuff Size: Normal)   Pulse 61   Temp 97.6 F (36.4 C) (Oral)   Ht 6\' 1"  (1.854 m)   Wt 177 lb 12.8 oz (80.6 kg)   SpO2 94%   BMI 23.46 kg/m   BP Readings from Last 3 Encounters:  06/17/23 100/60  05/17/23 94/64  11/20/22 (!) 92/58    Wt Readings from Last 3 Encounters:  06/17/23 177 lb 12.8 oz (80.6 kg)  06/11/23 177 lb (80.3 kg)  05/17/23 179 lb 12.8 oz (81.6 kg)    Physical Exam Constitutional:      General: He is not in acute distress.    Appearance: He is well-developed.     Comments: NAD  Eyes:     Conjunctiva/sclera: Conjunctivae normal.     Pupils: Pupils are equal, round, and reactive to light.  Neck:     Thyroid: No thyromegaly.     Vascular: No JVD.  Cardiovascular:     Rate and Rhythm: Normal rate and regular rhythm.     Heart sounds: Normal heart sounds. No murmur  heard.    No friction rub. No gallop.  Pulmonary:     Effort: Pulmonary effort is normal. No respiratory distress.     Breath sounds: Normal breath sounds. No wheezing or rales.  Chest:     Chest wall: No tenderness.  Abdominal:     General: Bowel sounds are normal. There is no distension.     Palpations: Abdomen is soft. There is no mass.     Tenderness: There is no abdominal tenderness. There is no guarding or rebound.  Musculoskeletal:        General: No tenderness. Normal range of motion.     Cervical back: Normal range of motion.  Lymphadenopathy:     Cervical: No cervical adenopathy.  Skin:    General: Skin is warm and dry.     Findings: No rash.  Neurological:     Mental Status: He is alert and oriented to  person, place, and time.     Cranial Nerves: No cranial nerve deficit.     Motor: No abnormal muscle tone.     Coordination: Coordination normal.     Gait: Gait normal.     Deep Tendon Reflexes: Reflexes are normal and symmetric.  Psychiatric:        Behavior: Behavior normal.        Thought Content: Thought content normal.        Judgment: Judgment normal.     Lab Results  Component Value Date   WBC 4.7 06/12/2022   HGB 13.3 06/12/2022   HCT 39.6 06/12/2022   PLT 188.0 06/12/2022   GLUCOSE 90 06/12/2022   CHOL 187 06/12/2022   TRIG 158.0 (H) 06/12/2022   HDL 49.00 06/12/2022   LDLDIRECT 106 (H) 12/21/2021   LDLCALC 106 (H) 06/12/2022   ALT 15 06/12/2022   AST 18 06/12/2022   NA 137 06/12/2022   K 4.3 06/12/2022   CL 105 06/12/2022   CREATININE 1.07 06/12/2022   BUN 28 (H) 06/12/2022   CO2 25 06/12/2022   TSH 2.21 06/12/2022   PSA 3.29 06/12/2022   INR 1.0 08/21/2021   HGBA1C 5.6 10/11/2016    MR ANGIO NECK W WO CONTRAST  Result Date: 09/19/2021 CLINICAL DATA:  Transient ischemic attack. EXAM: MRA NECK WITHOUT AND WITH CONTRAST MRA HEAD WITHOUT CONTRAST TECHNIQUE: Multiplanar and multiecho pulse sequences of the neck were obtained without and with intravenous contrast. Angiographic images of the neck were obtained using MRA technique without and with intravenous contrast; Angiographic images of the Circle of Willis were obtained using MRA technique without intravenous contrast. CONTRAST:  17mL MULTIHANCE GADOBENATE DIMEGLUMINE 529 MG/ML IV SOLN COMPARISON:  MRI of the brain August 21, 2021. FINDINGS: MRA NECK FINDINGS Aortic arch: Normal variant aortic arch branching pattern with the brachiocephalic and left common carotid arteries sharing a common origin. The visualized subclavian arteries are normal. Right carotid system: Normal course and caliber without stenosis or evidence of dissection. Left carotid system: Normal course and caliber without stenosis or evidence of  dissection. Vertebral arteries: Codominant. Vertebral artery origins are normal. Vertebral arteries are normal in course and caliber to the vertebrobasilar confluence without stenosis or evidence of dissection. MRA HEAD FINDINGS The visualized portions of the distal cervical and intracranial internal carotid arteries are widely patent with normal flow related enhancement. The bilateral anterior cerebral arteries are patent with antegrade flow without high-grade flow-limiting stenosis or proximal branch occlusion. Attenuation of the flow related enhancement at the bilateral M1/MCA bifurcation and proximal M2 branches is  artifactual. No intracranial aneurysm within the anterior circulation. Attenuation of the flow related enhancement in the bilateral V3-V4 junction is artifactual. Vertebral arteries are otherwise unremarkable. Vertebrobasilar junction and basilar artery are widely patent with antegrade flow without evidence of basilar stenosis or aneurysm. Posterior cerebral arteries are normal bilaterally. No intracranial aneurysm within the posterior circulation. IMPRESSION: No significant vascular abnormality of the of the major arteries of the head and neck. Electronically Signed   By: Baldemar Lenis M.D.   On: 09/19/2021 11:14   MR ANGIO HEAD WO CONTRAST  Result Date: 09/19/2021 CLINICAL DATA:  Transient ischemic attack. EXAM: MRA NECK WITHOUT AND WITH CONTRAST MRA HEAD WITHOUT CONTRAST TECHNIQUE: Multiplanar and multiecho pulse sequences of the neck were obtained without and with intravenous contrast. Angiographic images of the neck were obtained using MRA technique without and with intravenous contrast; Angiographic images of the Circle of Willis were obtained using MRA technique without intravenous contrast. CONTRAST:  17mL MULTIHANCE GADOBENATE DIMEGLUMINE 529 MG/ML IV SOLN COMPARISON:  MRI of the brain August 21, 2021. FINDINGS: MRA NECK FINDINGS Aortic arch: Normal variant aortic arch  branching pattern with the brachiocephalic and left common carotid arteries sharing a common origin. The visualized subclavian arteries are normal. Right carotid system: Normal course and caliber without stenosis or evidence of dissection. Left carotid system: Normal course and caliber without stenosis or evidence of dissection. Vertebral arteries: Codominant. Vertebral artery origins are normal. Vertebral arteries are normal in course and caliber to the vertebrobasilar confluence without stenosis or evidence of dissection. MRA HEAD FINDINGS The visualized portions of the distal cervical and intracranial internal carotid arteries are widely patent with normal flow related enhancement. The bilateral anterior cerebral arteries are patent with antegrade flow without high-grade flow-limiting stenosis or proximal branch occlusion. Attenuation of the flow related enhancement at the bilateral M1/MCA bifurcation and proximal M2 branches is artifactual. No intracranial aneurysm within the anterior circulation. Attenuation of the flow related enhancement in the bilateral V3-V4 junction is artifactual. Vertebral arteries are otherwise unremarkable. Vertebrobasilar junction and basilar artery are widely patent with antegrade flow without evidence of basilar stenosis or aneurysm. Posterior cerebral arteries are normal bilaterally. No intracranial aneurysm within the posterior circulation. IMPRESSION: No significant vascular abnormality of the of the major arteries of the head and neck. Electronically Signed   By: Baldemar Lenis M.D.   On: 09/19/2021 11:14    Assessment & Plan:   Problem List Items Addressed This Visit     Hypothyroidism    On Levothyroxine Monitor TSH      Relevant Orders   Urinalysis   Hyperlipidemia - Primary    Statin, Repatha intolerant      Relevant Orders   TSH   Lipid panel   PSA   Comprehensive metabolic panel   Fatigue    Age related      Relevant Orders   TSH    Comprehensive metabolic panel   Urinalysis   Elevated PSA   Relevant Orders   PSA   Dyslipidemia    On Repatha      Relevant Orders   Lipid panel   Coronary artery disease    Will ref to see Dr. Excell Seltzer to consider a biweekly injection of Repatha      Relevant Orders   TSH      No orders of the defined types were placed in this encounter.     Follow-up: Return in about 1 year (around 06/16/2024) for a follow-up visit.  Sonda Primes, MD

## 2023-06-17 NOTE — Assessment & Plan Note (Signed)
Statin, Repatha intolerant

## 2023-06-20 ENCOUNTER — Ambulatory Visit: Payer: Medicare Other | Admitting: Cardiovascular Disease

## 2023-06-20 ENCOUNTER — Other Ambulatory Visit (INDEPENDENT_AMBULATORY_CARE_PROVIDER_SITE_OTHER): Payer: Medicare Other

## 2023-06-20 DIAGNOSIS — R5383 Other fatigue: Secondary | ICD-10-CM | POA: Diagnosis not present

## 2023-06-20 DIAGNOSIS — E785 Hyperlipidemia, unspecified: Secondary | ICD-10-CM

## 2023-06-20 DIAGNOSIS — I25810 Atherosclerosis of coronary artery bypass graft(s) without angina pectoris: Secondary | ICD-10-CM

## 2023-06-20 DIAGNOSIS — E039 Hypothyroidism, unspecified: Secondary | ICD-10-CM | POA: Diagnosis not present

## 2023-06-20 DIAGNOSIS — R972 Elevated prostate specific antigen [PSA]: Secondary | ICD-10-CM

## 2023-06-20 DIAGNOSIS — E782 Mixed hyperlipidemia: Secondary | ICD-10-CM

## 2023-06-20 LAB — URINALYSIS
Bilirubin Urine: NEGATIVE
Hgb urine dipstick: NEGATIVE
Ketones, ur: NEGATIVE
Leukocytes,Ua: NEGATIVE
Nitrite: NEGATIVE
Specific Gravity, Urine: 1.01 (ref 1.000–1.030)
Total Protein, Urine: NEGATIVE
Urine Glucose: >=1000 — AB
Urobilinogen, UA: 0.2 (ref 0.0–1.0)
pH: 6 (ref 5.0–8.0)

## 2023-06-20 LAB — COMPREHENSIVE METABOLIC PANEL
ALT: 16 U/L (ref 0–53)
AST: 22 U/L (ref 0–37)
Albumin: 4.1 g/dL (ref 3.5–5.2)
Alkaline Phosphatase: 45 U/L (ref 39–117)
BUN: 20 mg/dL (ref 6–23)
CO2: 29 meq/L (ref 19–32)
Calcium: 9.1 mg/dL (ref 8.4–10.5)
Chloride: 106 meq/L (ref 96–112)
Creatinine, Ser: 1.31 mg/dL (ref 0.40–1.50)
GFR: 52.62 mL/min — ABNORMAL LOW (ref >60.00)
Glucose, Bld: 98 mg/dL (ref 70–99)
Potassium: 4.4 meq/L (ref 3.5–5.1)
Sodium: 141 meq/L (ref 135–145)
Total Bilirubin: 0.5 mg/dL (ref 0.2–1.2)
Total Protein: 6.2 g/dL (ref 6.0–8.3)

## 2023-06-20 LAB — LIPID PANEL
Cholesterol: 181 mg/dL (ref 0–200)
HDL: 49.9 mg/dL (ref >39.00)
LDL Cholesterol: 107 mg/dL — ABNORMAL HIGH (ref 0–99)
NonHDL: 130.79
Total CHOL/HDL Ratio: 4
Triglycerides: 121 mg/dL (ref 0.0–149.0)
VLDL: 24.2 mg/dL (ref 0.0–40.0)

## 2023-06-20 LAB — PSA: PSA: 4.29 ng/mL — ABNORMAL HIGH (ref 0.10–4.00)

## 2023-06-20 LAB — TSH: TSH: 3.58 u[IU]/mL (ref 0.35–5.50)

## 2023-07-01 ENCOUNTER — Other Ambulatory Visit: Payer: Self-pay | Admitting: Cardiovascular Disease

## 2023-07-16 ENCOUNTER — Other Ambulatory Visit: Payer: Self-pay | Admitting: Internal Medicine

## 2023-08-10 ENCOUNTER — Other Ambulatory Visit: Payer: Self-pay | Admitting: Cardiovascular Disease

## 2023-10-03 NOTE — Progress Notes (Signed)
[facility] [address] [place] [address] Phone: [phone] Subjective:     I'm seeing this patient by the request  of:  Plotnikov, Georgina Quint, MD  CC: Back pain  [id number]  Richard Ellis is a 78 y.o. male coming in with complaint of back pain. Patient suffered rib fracture on 03/08/2023. Patient felt like injection in S1 a few years ago was helpful. Pain is similar to pain he had 3 years ago. Pa in L side of his lumbar spine. Painful to run. Denies any radiating symptoms.       Past Medical History:  Diagnosis Date   Allergy    rhinitis   Anemia    "Hgb always on the low side" (07/02/2013)   CAD (coronary artery disease)    a. LHC 2/17: pLAD 50, mLAD 95, pRCA 30, dRCA 25, EF 50-55% >> PCI:  3 x 18 mm Resolute DES to mLAD    Chronic bronchitis (HCC)    "not in the last year since I started taking allergy shots" (10/09/2016)   Chronic lower back pain    Chronic sinus bradycardia 07/02/2013   Coronary artery disease 10/15/2016   Cryptogenic stroke (HCC) 07/02/2013   a. "small ischemic occipital right sided" (07/02/2013)  //  b. Event monitor 10/14: sinus brady  //  c. Carotid US 10/14: bilat ICA 1-39%   Esophageal stricture    Exertional angina 10/09/2016   Heart murmur    "as a child" (07/02/2013)   History of echocardiogram    a. Echo 10/14: EF 60-65%, no RWMA, normal diastolic function   Hyperlipidemia    Hypothyroidism    Nonallopathic lesion of lumbosacral region 06/04/2017   Nonallopathic lesion of sacral region 06/04/2017   Nonallopathic lesion of thoracic region 06/04/2017   Osteoarthritis    "lower back" (10/09/2016)   PFO (patent foramen ovale)    a. TEE 11/14: mild LVH, EF 60-65%, small PFO, R-L shunt   Past Surgical History:  Procedure Laterality Date   CARDIAC CATHETERIZATION N/A 11/10/2015   Procedure: Left Heart Cath and Coronary Angiography;  Surgeon: Tonny Bollman, MD;  Location: [facility] INVASIVE CV LAB;  Service:  Cardiovascular;  Laterality: N/A;   CARDIAC CATHETERIZATION N/A 11/10/2015   Procedure: Coronary Stent Intervention;  Surgeon: Tonny Bollman, MD;  Location: [facility] INVASIVE CV LAB;  Service: Cardiovascular;  Laterality: N/A;  mid lad  3.0x18 resolute   CARDIAC CATHETERIZATION  10/09/2016   "scheduled OHS for tomorrow" (10/09/2016)   CARDIAC CATHETERIZATION N/A 10/09/2016   Procedure: Left Heart Cath and Coronary Angiography;  Surgeon: Tonny Bollman, MD;  Location: [facility] INVASIVE CV LAB;  Service: Cardiovascular;  Laterality: N/A;   CARDIAC CATHETERIZATION N/A 10/09/2016   Procedure: Coronary Balloon Angioplasty;  Surgeon: Tonny Bollman, MD;  Location: [facility] INVASIVE CV LAB;  Service: Cardiovascular;  Laterality: N/A;   CORONARY ARTERY BYPASS GRAFT N/A 10/15/2016   Procedure: CORONARY ARTERY BYPASS GRAFTING (CABG), ON PUMP, TIMES TWO, USING LEFT INTERNAL MAMMARY ARTERY AND RIGHT GREATER SAPHENOUS VEIN HARVESTED ENDOSCOPICALLY;  Surgeon: Kerin Perna, MD;  Location: [facility] OR;  Service: Open Heart Surgery;  Laterality: N/A;  LIMA to LAD, SVG to DIAGONAL   ESOPHAGOGASTRODUODENOSCOPY N/A 10/26/2012   Procedure: ESOPHAGOGASTRODUODENOSCOPY (EGD);  Surgeon: Vertell Novak., MD;  Location: [facility] ENDOSCOPY;  Service: Endoscopy;  Laterality: N/A;   ESOPHAGOGASTRODUODENOSCOPY N/A 07/08/2013   Procedure: ESOPHAGOGASTRODUODENOSCOPY (EGD);  Surgeon: Vertell Novak., MD;  Location: [facility] ENDOSCOPY;  Service: Endoscopy;  Laterality: N/A;  INNER EAR SURGERY Left 1957   "related to ear infection"   REPAIR OF PATENT FORAMEN OVALE N/A 10/15/2016   Procedure: REPAIR OF PATENT FORAMEN OVALE;  Surgeon: Kerin Perna, MD;  Location: [facility] OR;  Service: Open Heart Surgery;  Laterality: N/A;   SAVORY DILATION N/A 07/08/2013   Procedure: SAVORY DILATION;  Surgeon: Vertell Novak., MD;  Location: [facility] ENDOSCOPY;  Service: Endoscopy;  Laterality: N/A;   TEE WITHOUT CARDIOVERSION N/A 07/28/2013   Procedure: TRANSESOPHAGEAL  ECHOCARDIOGRAM (TEE);  Surgeon: Chrystie Nose, MD;  Location: [facility] ENDOSCOPY;  Service: Cardiovascular;  Laterality: N/A;   TEE WITHOUT CARDIOVERSION N/A 10/15/2016   Procedure: TRANSESOPHAGEAL ECHOCARDIOGRAM (TEE);  Surgeon: Kerin Perna, MD;  Location: [facility] OR;  Service: Open Heart Surgery;  Laterality: N/A;   TONSILLECTOMY  1950's   Social History   Socioeconomic History   Marital status: Married    Spouse name: Rayfield Citizen   Number of children: 1   Years of education: Not on file   Highest education level: Master's degree (e.g., MA, MS, MEng, MEd, MSW, MBA)  Occupational History   Occupation: Retired  Tobacco Use   Smoking status: Never   Smokeless tobacco: Never  Vaping Use   Vaping status: Never Used  Substance and Sexual Activity   Alcohol use: Yes    Alcohol/week: 8.0 standard drinks of alcohol    Types: 4 Glasses of wine, 4 Cans of beer per week    Comment: weekly   Drug use: No   Sexual activity: Yes  Other Topics Concern   Not on file  Social History Narrative   Lives with wife   Social Drivers of Health   Financial Resource Strain: Low Risk  (06/14/2023)   Overall Financial Resource Strain (CARDIA)    Difficulty of Paying Living Expenses: Not hard at all  Food Insecurity: No Food Insecurity (06/14/2023)   Hunger Vital Sign    Worried About Running Out of Food in the Last Year: Never true    Ran Out of Food in the Last Year: Never true  Transportation Needs: No Transportation Needs (06/14/2023)   PRAPARE - Administrator, Civil Service (Medical): No    Lack of Transportation (Non-Medical): No  Physical Activity: Sufficiently Active (06/14/2023)   Exercise Vital Sign    Days of Exercise per Week: 6 days    Minutes of Exercise per Session: 50 min  Stress: No Stress Concern Present (06/14/2023)   [employer] of Occupational Health - Occupational Stress Questionnaire    Feeling of Stress : Not at all  Social Connections: Socially Integrated  (06/14/2023)   Social Connection and Isolation Panel [NHANES]    Frequency of Communication with Friends and Family: Three times a week    Frequency of Social Gatherings with Friends and Family: Once a week    Attends Religious Services: More than 4 times per year    Active Member of [employer] or Organizations: Yes    Attends Engineer, structural: More than 4 times per year    Marital Status: Married   Allergies  Allergen Reactions   Niacin Er (Antihyperlipidemic) Other (See Comments)   Statins Other (See Comments)   Atorvastatin Other (See Comments)    Muscle weakness   Crestor [Rosuvastatin] Other (See Comments)    Muscle weakness   Demerol [Meperidine] Nausea Only   Niacin And Related Itching, Rash and Other (See Comments)    Palms turned red and itched   Repatha [Evolocumab]  Other (See Comments)    Muscle weakness   Family History  Problem Relation Age of Onset   Coronary artery disease Other        male first degree relative <60   Heart disease Father        heart attack   COPD Mother    Heart disease Paternal Uncle    Colon cancer Neg Hx     Current Outpatient Medications (Endocrine & Metabolic):    dapagliflozin propanediol (FARXIGA) 10 MG TABS tablet, Take 1 tablet (10 mg total) by mouth daily before breakfast.   levothyroxine (SYNTHROID) 50 MCG tablet, AS DIRECTED ORALLY ONCE A DAY FOR 6 DAYS AND SKIP ON SUNDAYS 90 DAYS  Current Outpatient Medications (Cardiovascular):    Alirocumab (PRALUENT) 75 MG/ML SOAJ, INJECT 1 ML INTO THE SKIN EVERY 14 DAYS   sacubitril-valsartan (ENTRESTO) 24-26 MG, TAKE 1 TABLET BY MOUTH TWICE A DAY  Current Outpatient Medications (Respiratory):    montelukast (SINGULAIR) 10 MG tablet, TAKE 1 TABLET BY MOUTH EVERYDAY AT BEDTIME   Current Outpatient Medications (Hematological):    clopidogrel (PLAVIX) 75 MG tablet, TAKE 1 TABLET BY MOUTH EVERY DAY   Ferrous Sulfate (IRON) 325 (65 Fe) MG TABS, Take 1 tablet by mouth. Taking three  times a week  Current Outpatient Medications (Other):    ALPRAZolam (XANAX) 0.5 MG tablet, Take 0.5 mg by mouth as needed for anxiety (for flying).   Cholecalciferol 2000 UNITS CAPS, Take 2,000 Units by mouth every evening.   Diclofenac Sodium 1.5 % SOLN, Apply 1-2 Pump topically 2 (two) times daily as needed.   Turmeric 500 MG CAPS, Take 1 capsule by mouth at bedtime.   Reviewed prior external information including notes and imaging from  primary care provider As well as notes that were available from care everywhere and other healthcare systems.  Past medical history, social, surgical and family history all reviewed in electronic medical record.  No pertanent information unless stated regarding to the chief complaint.   Review of Systems:  No headache, visual changes, nausea, vomiting, diarrhea, constipation, dizziness, abdominal pain, skin rash, fevers, chills, night sweats, weight loss, swollen lymph nodes, body aches, joint swelling, chest pain, shortness of breath, mood changes. POSITIVE muscle aches  Objective  Blood pressure 110/82, pulse 69, height 6\' 1"  (1.854 m), weight 184 lb (83.5 kg), SpO2 98%.   General: No apparent distress alert and oriented x3 mood and affect normal, dressed appropriately.  HEENT: Pupils equal, extraocular movements intact  Respiratory: Patient's speak in full sentences and does not appear short of breath  Cardiovascular: No lower extremity edema, non tender, no erythema  Low back exam tenderness to palpation of the paraspinal musculature.  Severely tender with some swelling noted over the sacroiliac joint.  Positive Faber on the left side and worsening pain with extension.  After verbal consent patient was prepped with alcohol swab and with a 21-gauge 2 inch needle injected into the left sacroiliac joint with a total of 1 cc 0.5% Marcaine and 1 cc of Kenalog 40 mg/mL.  No blood loss.  Band-Aid placed.  Postinjection instructions given    Impression  and Recommendations:    The above documentation has been reviewed and is accurate and complete Judi Saa, DO

## 2023-10-08 ENCOUNTER — Ambulatory Visit: Payer: Medicare Other | Admitting: Family Medicine

## 2023-10-08 ENCOUNTER — Encounter: Payer: Self-pay | Admitting: Family Medicine

## 2023-10-08 VITALS — BP 110/82 | HR 69 | Ht 73.0 in | Wt 184.0 lb

## 2023-10-08 DIAGNOSIS — M461 Sacroiliitis, not elsewhere classified: Secondary | ICD-10-CM

## 2023-10-08 DIAGNOSIS — M47816 Spondylosis without myelopathy or radiculopathy, lumbar region: Secondary | ICD-10-CM

## 2023-10-08 NOTE — Patient Instructions (Signed)
Injected L SI joint If not better by Friday, we will order epidural See me again in

## 2023-10-08 NOTE — Assessment & Plan Note (Addendum)
If worsening pain we will consider continuing to monitor.  Discussed icing regimen and home exercises, which activities to do and which ones to avoid.  Worsening pain consider an epidural at L5-S1.

## 2023-10-08 NOTE — Assessment & Plan Note (Signed)
Given injection and tolerated the procedure well, discussed icing regimen and home exercises, discussed which activities to do and which ones to avoid.  Increase activity slowly.  Differential includes of the lumbar radiculopathy where patient has responded well to an epidural previously.  Patient does have nationals for his running group in the next 6 weeks so hopefully we will get this improved 1 way or the other in the near future.  The patient is in a treatment with the plan.

## 2023-10-10 ENCOUNTER — Other Ambulatory Visit: Payer: Self-pay | Admitting: Cardiovascular Disease

## 2023-10-10 DIAGNOSIS — E782 Mixed hyperlipidemia: Secondary | ICD-10-CM

## 2023-10-12 ENCOUNTER — Encounter: Payer: Self-pay | Admitting: Family Medicine

## 2023-10-12 ENCOUNTER — Other Ambulatory Visit: Payer: Self-pay | Admitting: Nurse Practitioner

## 2023-10-12 DIAGNOSIS — J4520 Mild intermittent asthma, uncomplicated: Secondary | ICD-10-CM

## 2023-10-16 ENCOUNTER — Other Ambulatory Visit: Payer: Self-pay | Admitting: Cardiovascular Disease

## 2023-10-31 ENCOUNTER — Other Ambulatory Visit: Payer: Self-pay | Admitting: Cardiovascular Disease

## 2023-10-31 ENCOUNTER — Other Ambulatory Visit: Payer: Self-pay | Admitting: Nurse Practitioner

## 2023-10-31 DIAGNOSIS — J4520 Mild intermittent asthma, uncomplicated: Secondary | ICD-10-CM

## 2023-11-13 ENCOUNTER — Encounter: Payer: Self-pay | Admitting: Family Medicine

## 2023-11-14 ENCOUNTER — Other Ambulatory Visit: Payer: Self-pay

## 2023-11-14 DIAGNOSIS — M5416 Radiculopathy, lumbar region: Secondary | ICD-10-CM

## 2023-11-19 ENCOUNTER — Ambulatory Visit
Admission: RE | Admit: 2023-11-19 | Discharge: 2023-11-19 | Disposition: A | Discharge status: Home / Self Care | Source: Ambulatory Visit | Attending: Family Medicine | Admitting: Family Medicine

## 2023-11-19 DIAGNOSIS — M5416 Radiculopathy, lumbar region: Secondary | ICD-10-CM

## 2023-11-19 DIAGNOSIS — M47817 Spondylosis without myelopathy or radiculopathy, lumbosacral region: Secondary | ICD-10-CM | POA: Diagnosis not present

## 2023-11-19 MED ORDER — METHYLPREDNISOLONE ACETATE 40 MG/ML INJ SUSP (RADIOLOG
80.0000 mg | Freq: Once | INTRAMUSCULAR | Status: AC
  Administered 2023-11-19: 80 mg via EPIDURAL

## 2023-11-19 MED ORDER — IOPAMIDOL (ISOVUE-M 200) INJECTION 41%
1.0000 mL | Freq: Once | INTRAMUSCULAR | Status: AC
  Administered 2023-11-19: 1 mL via EPIDURAL

## 2023-11-19 NOTE — Discharge Instructions (Signed)

## 2023-11-21 ENCOUNTER — Inpatient Hospital Stay: Admission: RE | Admit: 2023-11-21 | Payer: Medicare Other | Source: Ambulatory Visit

## 2023-12-03 NOTE — Progress Notes (Unsigned)
 [facility] [address] [place] [address] Phone: [phone] Subjective:   Richard Ellis, am serving as a scribe for Dr. Antoine Primas.  I'm seeing this patient by the request  of:  Plotnikov, Georgina Quint, MD  CC: Left lower back pain follow-up  [id number]  10/08/2023 If worsening pain we will consider continuing to monitor.  Discussed icing regimen and home exercises, which activities to do and which ones to avoid.  Worsening pain consider an epidural at L5-S1.     Given injection and tolerated the procedure well, discussed icing regimen and home exercises, discussed which activities to do and which ones to avoid.  Increase activity slowly.  Differential includes of the lumbar radiculopathy where patient has responded well to an epidural previously.  Patient does have nationals for his running group in the next 6 weeks so hopefully we will get this improved 1 way or the other in the near future.  The patient is in a treatment with the plan.      Update 12/04/2023 Richard Ellis is a 78 y.o. male coming in with complaint of L SI joint and lumbar spinal stenosis pain. Epidural 11/19/2023. Patient states the injection in office and epidural was helpful. Able to run but still has pain. Pain mostly on L side but has had some weakness in R leg with running. Going to be running 6 races in next month.        Past Medical History:  Diagnosis Date   Allergy    rhinitis   Anemia    "Hgb always on the low side" (07/02/2013)   CAD (coronary artery disease)    a. LHC 2/17: pLAD 50, mLAD 95, pRCA 30, dRCA 25, EF 50-55% >> PCI:  3 x 18 mm Resolute DES to mLAD    Chronic bronchitis (HCC)    "not in the last year since I started taking allergy shots" (10/09/2016)   Chronic lower back pain    Chronic sinus bradycardia 07/02/2013   Coronary artery disease 10/15/2016   Cryptogenic stroke (HCC) 07/02/2013   a. "small ischemic occipital right sided" (07/02/2013)   //  b. Event monitor 10/14: sinus brady  //  c. Carotid US 10/14: bilat ICA 1-39%   Esophageal stricture    Exertional angina (HCC) 10/09/2016   Heart murmur    "as a child" (07/02/2013)   History of echocardiogram    a. Echo 10/14: EF 60-65%, no RWMA, normal diastolic function   Hyperlipidemia    Hypothyroidism    Nonallopathic lesion of lumbosacral region 06/04/2017   Nonallopathic lesion of sacral region 06/04/2017   Nonallopathic lesion of thoracic region 06/04/2017   Osteoarthritis    "lower back" (10/09/2016)   PFO (patent foramen ovale)    a. TEE 11/14: mild LVH, EF 60-65%, small PFO, R-L shunt   Past Surgical History:  Procedure Laterality Date   CARDIAC CATHETERIZATION N/A 11/10/2015   Procedure: Left Heart Cath and Coronary Angiography;  Surgeon: Tonny Bollman, MD;  Location: [facility] INVASIVE CV LAB;  Service: Cardiovascular;  Laterality: N/A;   CARDIAC CATHETERIZATION N/A 11/10/2015   Procedure: Coronary Stent Intervention;  Surgeon: Tonny Bollman, MD;  Location: [facility] INVASIVE CV LAB;  Service: Cardiovascular;  Laterality: N/A;  mid lad  3.0x18 resolute   CARDIAC CATHETERIZATION  10/09/2016   "scheduled OHS for tomorrow" (10/09/2016)   CARDIAC CATHETERIZATION N/A 10/09/2016   Procedure: Left Heart Cath and Coronary Angiography;  Surgeon: Tonny Bollman, MD;  Location:  [facility] INVASIVE CV LAB;  Service: Cardiovascular;  Laterality: N/A;   CARDIAC CATHETERIZATION N/A 10/09/2016   Procedure: Coronary Balloon Angioplasty;  Surgeon: Tonny Bollman, MD;  Location: [facility] INVASIVE CV LAB;  Service: Cardiovascular;  Laterality: N/A;   CORONARY ARTERY BYPASS GRAFT N/A 10/15/2016   Procedure: CORONARY ARTERY BYPASS GRAFTING (CABG), ON PUMP, TIMES TWO, USING LEFT INTERNAL MAMMARY ARTERY AND RIGHT GREATER SAPHENOUS VEIN HARVESTED ENDOSCOPICALLY;  Surgeon: Kerin Perna, MD;  Location: [facility] OR;  Service: Open Heart Surgery;  Laterality: N/A;  LIMA to LAD, SVG to DIAGONAL   ESOPHAGOGASTRODUODENOSCOPY N/A  10/26/2012   Procedure: ESOPHAGOGASTRODUODENOSCOPY (EGD);  Surgeon: Vertell Novak., MD;  Location: [facility] ENDOSCOPY;  Service: Endoscopy;  Laterality: N/A;   ESOPHAGOGASTRODUODENOSCOPY N/A 07/08/2013   Procedure: ESOPHAGOGASTRODUODENOSCOPY (EGD);  Surgeon: Vertell Novak., MD;  Location: [facility] ENDOSCOPY;  Service: Endoscopy;  Laterality: N/A;   INNER EAR SURGERY Left 1957   "related to ear infection"   REPAIR OF PATENT FORAMEN OVALE N/A 10/15/2016   Procedure: REPAIR OF PATENT FORAMEN OVALE;  Surgeon: Kerin Perna, MD;  Location: [facility] OR;  Service: Open Heart Surgery;  Laterality: N/A;   SAVORY DILATION N/A 07/08/2013   Procedure: SAVORY DILATION;  Surgeon: Vertell Novak., MD;  Location: [facility] ENDOSCOPY;  Service: Endoscopy;  Laterality: N/A;   TEE WITHOUT CARDIOVERSION N/A 07/28/2013   Procedure: TRANSESOPHAGEAL ECHOCARDIOGRAM (TEE);  Surgeon: Chrystie Nose, MD;  Location: [facility] ENDOSCOPY;  Service: Cardiovascular;  Laterality: N/A;   TEE WITHOUT CARDIOVERSION N/A 10/15/2016   Procedure: TRANSESOPHAGEAL ECHOCARDIOGRAM (TEE);  Surgeon: Kerin Perna, MD;  Location: [facility] OR;  Service: Open Heart Surgery;  Laterality: N/A;   TONSILLECTOMY  1950's   Social History   Socioeconomic History   Marital status: Married    Spouse name: Rayfield Citizen   Number of children: 1   Years of education: Not on file   Highest education level: Master's degree (e.g., MA, MS, MEng, MEd, MSW, MBA)  Occupational History   Occupation: Retired  Tobacco Use   Smoking status: Never   Smokeless tobacco: Never  Vaping Use   Vaping status: Never Used  Substance and Sexual Activity   Alcohol use: Yes    Alcohol/week: 8.0 standard drinks of alcohol    Types: 4 Glasses of wine, 4 Cans of beer per week    Comment: weekly   Drug use: No   Sexual activity: Yes  Other Topics Concern   Not on file  Social History Narrative   Lives with wife   Social Drivers of Health   Financial Resource Strain: Low Risk   (06/14/2023)   Overall Financial Resource Strain (CARDIA)    Difficulty of Paying Living Expenses: Not hard at all  Food Insecurity: No Food Insecurity (06/14/2023)   Hunger Vital Sign    Worried About Running Out of Food in the Last Year: Never true    Ran Out of Food in the Last Year: Never true  Transportation Needs: No Transportation Needs (06/14/2023)   PRAPARE - Administrator, Civil Service (Medical): No    Lack of Transportation (Non-Medical): No  Physical Activity: Sufficiently Active (06/14/2023)   Exercise Vital Sign    Days of Exercise per Week: 6 days    Minutes of Exercise per Session: 50 min  Stress: No Stress Concern Present (06/14/2023)   [employer] of Occupational Health - Occupational Stress Questionnaire    Feeling of Stress : Not at all  Social Connections: Socially  Integrated (06/14/2023)   Social Connection and Isolation Panel [NHANES]    Frequency of Communication with Friends and Family: Three times a week    Frequency of Social Gatherings with Friends and Family: Once a week    Attends Religious Services: More than 4 times per year    Active Member of [employer] or Organizations: Yes    Attends Engineer, structural: More than 4 times per year    Marital Status: Married   Allergies  Allergen Reactions   Niacin Er (Antihyperlipidemic) Other (See Comments)   Statins Other (See Comments)   Atorvastatin Other (See Comments)    Muscle weakness   Crestor [Rosuvastatin] Other (See Comments)    Muscle weakness   Demerol [Meperidine] Nausea Only   Niacin And Related Itching, Rash and Other (See Comments)    Palms turned red and itched   Repatha [Evolocumab] Other (See Comments)    Muscle weakness   Family History  Problem Relation Age of Onset   Coronary artery disease Other        male first degree relative <60   Heart disease Father        heart attack   COPD Mother    Heart disease Paternal Uncle    Colon cancer Neg Hx      Current Outpatient Medications (Endocrine & Metabolic):    dapagliflozin propanediol (FARXIGA) 10 MG TABS tablet, TAKE 1 TABLET BY MOUTH DAILY BEFORE BREAKFAST.   levothyroxine (SYNTHROID) 50 MCG tablet, AS DIRECTED ORALLY ONCE A DAY FOR 6 DAYS AND SKIP ON SUNDAYS 90 DAYS  Current Outpatient Medications (Cardiovascular):    Alirocumab (PRALUENT) 75 MG/ML SOAJ, INJECT 1 ML INTO THE SKIN EVERY 14 DAYS   sacubitril-valsartan (ENTRESTO) 24-26 MG, TAKE 1 TABLET BY MOUTH TWICE A DAY  Current Outpatient Medications (Respiratory):    montelukast (SINGULAIR) 10 MG tablet, TAKE 1 TABLET BY MOUTH EVERYDAY AT BEDTIME   Current Outpatient Medications (Hematological):    clopidogrel (PLAVIX) 75 MG tablet, TAKE 1 TABLET BY MOUTH EVERY DAY   Ferrous Sulfate (IRON) 325 (65 Fe) MG TABS, Take 1 tablet by mouth. Taking three times a week  Current Outpatient Medications (Other):    ALPRAZolam (XANAX) 0.5 MG tablet, Take 0.5 mg by mouth as needed for anxiety (for flying).   Cholecalciferol 2000 UNITS CAPS, Take 2,000 Units by mouth every evening.   Diclofenac Sodium 1.5 % SOLN, Apply 1-2 Pump topically 2 (two) times daily as needed.   Turmeric 500 MG CAPS, Take 1 capsule by mouth at bedtime.   Reviewed prior external information including notes and imaging from  primary care provider As well as notes that were available from care everywhere and other healthcare systems.  Past medical history, social, surgical and family history all reviewed in electronic medical record.  No pertanent information unless stated regarding to the chief complaint.   Review of Systems:  No headache, visual changes, nausea, vomiting, diarrhea, constipation, dizziness, abdominal pain, skin rash, fevers, chills, night sweats, weight loss, swollen lymph nodes, body aches, joint swelling, chest pain, shortness of breath, mood changes. POSITIVE muscle aches  Objective  Blood pressure 138/72, pulse (!) 52, height 6\' 1"  (1.854  m), SpO2 98%.   General: No apparent distress alert and oriented x3 mood and affect normal, dressed appropriately.  HEENT: Pupils equal, extraocular movements intact  Respiratory: Patient's speak in full sentences and does not appear short of breath  Cardiovascular: No lower extremity edema, non tender, no erythema  Low back  exam shows loss lordosis noted.  Severe tenderness still over the sacroiliac joint.  Limited range of motion in all planes.  Patient is voluntary and involuntary guarding with some radicular symptoms noted.  4 out of 5 strength of the lower extremity on the left compared to the right.    Impression and Recommendations:    The above documentation has been reviewed and is accurate and complete Judi Saa, DO

## 2023-12-04 ENCOUNTER — Ambulatory Visit (INDEPENDENT_AMBULATORY_CARE_PROVIDER_SITE_OTHER): Payer: Medicare Other | Admitting: Family Medicine

## 2023-12-04 VITALS — BP 138/72 | HR 52 | Ht 73.0 in

## 2023-12-04 DIAGNOSIS — M5416 Radiculopathy, lumbar region: Secondary | ICD-10-CM

## 2023-12-04 DIAGNOSIS — M461 Sacroiliitis, not elsewhere classified: Secondary | ICD-10-CM

## 2023-12-04 DIAGNOSIS — M47816 Spondylosis without myelopathy or radiculopathy, lumbar region: Secondary | ICD-10-CM | POA: Diagnosis not present

## 2023-12-04 NOTE — Assessment & Plan Note (Addendum)
 Known spondylosis as well as potentially spondylolisthesis.  I am concerned that the patient may have facet arthropathy or even fracture noted.  Going after an MRI and it was greater than 78 years old and tried an epidural that helped some of the pain but unfortunately is not enough that it allows patient to do daily activities without any type of discomfort.  Do feel that an MRI of the lumbar and pelvis would be helpful to rule out any occult fracture that could be contributing.  Patient is in agreement with the plan.  Depending on imaging we will discuss other possible treatment options thereafter.

## 2023-12-04 NOTE — Patient Instructions (Addendum)
 MRI at [facility]  When we receive your results we will contact you. Kick ass on Saturday

## 2023-12-15 ENCOUNTER — Ambulatory Visit

## 2023-12-15 DIAGNOSIS — M160 Bilateral primary osteoarthritis of hip: Secondary | ICD-10-CM | POA: Diagnosis not present

## 2023-12-15 DIAGNOSIS — M5416 Radiculopathy, lumbar region: Secondary | ICD-10-CM

## 2023-12-15 DIAGNOSIS — M545 Low back pain, unspecified: Secondary | ICD-10-CM | POA: Diagnosis not present

## 2023-12-15 DIAGNOSIS — R102 Pelvic and perineal pain: Secondary | ICD-10-CM | POA: Diagnosis not present

## 2023-12-15 DIAGNOSIS — G8929 Other chronic pain: Secondary | ICD-10-CM | POA: Diagnosis not present

## 2023-12-15 DIAGNOSIS — M48061 Spinal stenosis, lumbar region without neurogenic claudication: Secondary | ICD-10-CM | POA: Diagnosis not present

## 2023-12-15 DIAGNOSIS — M461 Sacroiliitis, not elsewhere classified: Secondary | ICD-10-CM

## 2023-12-31 ENCOUNTER — Telehealth: Payer: Self-pay | Admitting: Pharmacy Technician

## 2023-12-31 ENCOUNTER — Encounter: Payer: Self-pay | Admitting: Family Medicine

## 2023-12-31 ENCOUNTER — Other Ambulatory Visit (HOSPITAL_COMMUNITY): Payer: Self-pay

## 2023-12-31 NOTE — Telephone Encounter (Signed)
 Pharmacy Patient Advocate Encounter  Received notification from [facility] that Prior Authorization for praluent has been APPROVED from 12/01/23 to 12/30/24   PA #/Case ID/Reference #: [id number]    Just got 28 days on 12/27/23

## 2023-12-31 NOTE — Telephone Encounter (Signed)
 Pharmacy Patient Advocate Encounter   Received notification from Onbase that prior authorization for praluent is required/requested.   Insurance verification completed.   The patient is insured through [facility] .   Per test claim: PA required; PA submitted to above mentioned insurance via CoverMyMeds Key/confirmation #/EOC [id number] Status is pending

## 2024-01-01 ENCOUNTER — Other Ambulatory Visit: Payer: Self-pay

## 2024-01-01 DIAGNOSIS — M545 Low back pain, unspecified: Secondary | ICD-10-CM

## 2024-01-02 NOTE — Discharge Instructions (Signed)

## 2024-01-03 ENCOUNTER — Ambulatory Visit
Admission: RE | Admit: 2024-01-03 | Discharge: 2024-01-03 | Disposition: A | Discharge status: Home / Self Care | Source: Ambulatory Visit | Attending: Family Medicine | Admitting: Family Medicine

## 2024-01-03 DIAGNOSIS — M545 Low back pain, unspecified: Secondary | ICD-10-CM

## 2024-01-03 DIAGNOSIS — M4727 Other spondylosis with radiculopathy, lumbosacral region: Secondary | ICD-10-CM | POA: Diagnosis not present

## 2024-01-03 MED ORDER — IOPAMIDOL (ISOVUE-M 200) INJECTION 41%
1.0000 mL | Freq: Once | INTRAMUSCULAR | Status: AC
Start: 2024-01-03 — End: 2024-01-03
  Administered 2024-01-03: 1 mL via EPIDURAL

## 2024-01-03 MED ORDER — METHYLPREDNISOLONE ACETATE 40 MG/ML INJ SUSP (RADIOLOG
80.0000 mg | Freq: Once | INTRAMUSCULAR | Status: AC
  Administered 2024-01-03: 80 mg via EPIDURAL

## 2024-01-06 DIAGNOSIS — H2513 Age-related nuclear cataract, bilateral: Secondary | ICD-10-CM | POA: Diagnosis not present

## 2024-02-17 ENCOUNTER — Ambulatory Visit (INDEPENDENT_AMBULATORY_CARE_PROVIDER_SITE_OTHER): Admitting: Family Medicine

## 2024-02-17 VITALS — BP 120/72 | HR 50 | Ht 73.0 in | Wt 181.0 lb

## 2024-02-17 DIAGNOSIS — M47816 Spondylosis without myelopathy or radiculopathy, lumbar region: Secondary | ICD-10-CM | POA: Diagnosis not present

## 2024-02-17 NOTE — Assessment & Plan Note (Signed)
 Patient is doing much better now after the epidural.  Discussed with patient that he does have some mild bursitis noted at the left hip.  Nothing that is stopping him from activities though.  Discussed icing regimen and home exercises, discussed avoiding certain activities.  Discussed that if worsening pain can consider another epidural if needed otherwise follow-up with me as needed.

## 2024-02-17 NOTE — Progress Notes (Signed)
 [facility] [address] [place] [address] Phone: [phone] Subjective:   IBryan Caprio, am serving as a scribe for Dr. Ronnell Coins.  I'm seeing this patient by the request  of:  Plotnikov, Oakley Bellman, MD  CC: Low back pain and hip pain follow-up  [id number]  12/04/2023 Known spondylosis as well as potentially spondylolisthesis.  I am concerned that the patient may have facet arthropathy or even fracture noted.  Going after an MRI and it was greater than 24 years old and tried an epidural that helped some of the pain but unfortunately is not enough that it allows patient to do daily activities without any type of discomfort.  Do feel that an MRI of the lumbar and pelvis would be helpful to rule out any occult fracture that could be contributing.  Patient is in agreement with the plan.  Depending on imaging we will discuss other possible treatment options thereafter.     Updated 02/17/2024 MAKEL MCMANN is a 78 y.o. male coming in with complaint of back pain. Epi on 01/03/2024. A little pain in the morning, and sometimes after running, but overall doing well. No new symptoms or concerns.     Past Medical History:  Diagnosis Date   Allergy    rhinitis   Anemia    "Hgb always on the low side" (07/02/2013)   CAD (coronary artery disease)    a. LHC 2/17: pLAD 50, mLAD 95, pRCA 30, dRCA 25, EF 50-55% >> PCI:  3 x 18 mm Resolute DES to mLAD    Chronic bronchitis (HCC)    "not in the last year since I started taking allergy shots" (10/09/2016)   Chronic lower back pain    Chronic sinus bradycardia 07/02/2013   Coronary artery disease 10/15/2016   Cryptogenic stroke (HCC) 07/02/2013   a. "small ischemic occipital right sided" (07/02/2013)  //  b. Event monitor 10/14: sinus brady  //  c. Carotid US  10/14: bilat ICA 1-39%   Esophageal stricture    Exertional angina (HCC) 10/09/2016   Heart murmur    "as a child" (07/02/2013)   History of  echocardiogram    a. Echo 10/14: EF 60-65%, no RWMA, normal diastolic function   Hyperlipidemia    Hypothyroidism    Nonallopathic lesion of lumbosacral region 06/04/2017   Nonallopathic lesion of sacral region 06/04/2017   Nonallopathic lesion of thoracic region 06/04/2017   Osteoarthritis    "lower back" (10/09/2016)   PFO (patent foramen ovale)    a. TEE 11/14: mild LVH, EF 60-65%, small PFO, R-L shunt   Past Surgical History:  Procedure Laterality Date   CARDIAC CATHETERIZATION N/A 11/10/2015   Procedure: Left Heart Cath and Coronary Angiography;  Surgeon: Arnoldo Lapping, MD;  Location: [facility] INVASIVE CV LAB;  Service: Cardiovascular;  Laterality: N/A;   CARDIAC CATHETERIZATION N/A 11/10/2015   Procedure: Coronary Stent Intervention;  Surgeon: Arnoldo Lapping, MD;  Location: [facility] INVASIVE CV LAB;  Service: Cardiovascular;  Laterality: N/A;  mid lad  3.0x18 resolute   CARDIAC CATHETERIZATION  10/09/2016   "scheduled OHS for tomorrow" (10/09/2016)   CARDIAC CATHETERIZATION N/A 10/09/2016   Procedure: Left Heart Cath and Coronary Angiography;  Surgeon: Arnoldo Lapping, MD;  Location: [facility] INVASIVE CV LAB;  Service: Cardiovascular;  Laterality: N/A;   CARDIAC CATHETERIZATION N/A 10/09/2016   Procedure: Coronary Balloon Angioplasty;  Surgeon: Arnoldo Lapping, MD;  Location: [facility] INVASIVE CV LAB;  Service: Cardiovascular;  Laterality: N/A;   CORONARY  ARTERY BYPASS GRAFT N/A 10/15/2016   Procedure: CORONARY ARTERY BYPASS GRAFTING (CABG), ON PUMP, TIMES TWO, USING LEFT INTERNAL MAMMARY ARTERY AND RIGHT GREATER SAPHENOUS VEIN HARVESTED ENDOSCOPICALLY;  Surgeon: Heriberto London, MD;  Location: [facility] OR;  Service: Open Heart Surgery;  Laterality: N/A;  LIMA to LAD, SVG to DIAGONAL   ESOPHAGOGASTRODUODENOSCOPY N/A 10/26/2012   Procedure: ESOPHAGOGASTRODUODENOSCOPY (EGD);  Surgeon: Venson Ginger., MD;  Location: [facility] ENDOSCOPY;  Service: Endoscopy;  Laterality: N/A;   ESOPHAGOGASTRODUODENOSCOPY N/A 07/08/2013    Procedure: ESOPHAGOGASTRODUODENOSCOPY (EGD);  Surgeon: Venson Ginger., MD;  Location: [facility] ENDOSCOPY;  Service: Endoscopy;  Laterality: N/A;   INNER EAR SURGERY Left 1957   "related to ear infection"   REPAIR OF PATENT FORAMEN OVALE N/A 10/15/2016   Procedure: REPAIR OF PATENT FORAMEN OVALE;  Surgeon: Heriberto London, MD;  Location: [facility] OR;  Service: Open Heart Surgery;  Laterality: N/A;   SAVORY DILATION N/A 07/08/2013   Procedure: SAVORY DILATION;  Surgeon: Venson Ginger., MD;  Location: [facility] ENDOSCOPY;  Service: Endoscopy;  Laterality: N/A;   TEE WITHOUT CARDIOVERSION N/A 07/28/2013   Procedure: TRANSESOPHAGEAL ECHOCARDIOGRAM (TEE);  Surgeon: Hazle Lites, MD;  Location: [facility] ENDOSCOPY;  Service: Cardiovascular;  Laterality: N/A;   TEE WITHOUT CARDIOVERSION N/A 10/15/2016   Procedure: TRANSESOPHAGEAL ECHOCARDIOGRAM (TEE);  Surgeon: Heriberto London, MD;  Location: [facility] OR;  Service: Open Heart Surgery;  Laterality: N/A;   TONSILLECTOMY  1950's   Social History   Socioeconomic History   Marital status: Married    Spouse name: Deadra Everts   Number of children: 1   Years of education: Not on file   Highest education level: Master's degree (e.g., MA, MS, MEng, MEd, MSW, MBA)  Occupational History   Occupation: Retired  Tobacco Use   Smoking status: Never   Smokeless tobacco: Never  Vaping Use   Vaping status: Never Used  Substance and Sexual Activity   Alcohol use: Yes    Alcohol/week: 8.0 standard drinks of alcohol    Types: 4 Glasses of wine, 4 Cans of beer per week    Comment: weekly   Drug use: No   Sexual activity: Yes  Other Topics Concern   Not on file  Social History Narrative   Lives with wife   Social Drivers of Health   Financial Resource Strain: Low Risk  (06/14/2023)   Overall Financial Resource Strain (CARDIA)    Difficulty of Paying Living Expenses: Not hard at all  Food Insecurity: No Food Insecurity (06/14/2023)   Hunger Vital Sign    Worried About Running  Out of Food in the Last Year: Never true    Ran Out of Food in the Last Year: Never true  Transportation Needs: No Transportation Needs (06/14/2023)   PRAPARE - Administrator, Civil Service (Medical): No    Lack of Transportation (Non-Medical): No  Physical Activity: Sufficiently Active (06/14/2023)   Exercise Vital Sign    Days of Exercise per Week: 6 days    Minutes of Exercise per Session: 50 min  Stress: No Stress Concern Present (06/14/2023)   [employer] of Occupational Health - Occupational Stress Questionnaire    Feeling of Stress : Not at all  Social Connections: Socially Integrated (06/14/2023)   Social Connection and Isolation Panel [NHANES]    Frequency of Communication with Friends and Family: Three times a week    Frequency of Social Gatherings with Friends and Family: Once a week    Attends  Religious Services: More than 4 times per year    Active Member of Clubs or Organizations: Yes    Attends Banker Meetings: More than 4 times per year    Marital Status: Married   Allergies  Allergen Reactions   Niacin  Er (Antihyperlipidemic) Other (See Comments)   Statins Other (See Comments)   Atorvastatin Other (See Comments)    Muscle weakness   Crestor  [Rosuvastatin ] Other (See Comments)    Muscle weakness   Demerol [Meperidine] Nausea Only   Niacin  And Related Itching, Rash and Other (See Comments)    Palms turned red and itched   Repatha  [Evolocumab ] Other (See Comments)    Muscle weakness   Family History  Problem Relation Age of Onset   Coronary artery disease Other        male first degree relative <60   Heart disease Father        heart attack   COPD Mother    Heart disease Paternal Uncle    Colon cancer Neg Hx     Current Outpatient Medications (Endocrine & Metabolic):    dapagliflozin  propanediol (FARXIGA ) 10 MG TABS tablet, TAKE 1 TABLET BY MOUTH DAILY BEFORE BREAKFAST.   levothyroxine  (SYNTHROID ) 50 MCG tablet, AS DIRECTED  ORALLY ONCE A DAY FOR 6 DAYS AND SKIP ON SUNDAYS 90 DAYS  Current Outpatient Medications (Cardiovascular):    Alirocumab  (PRALUENT ) 75 MG/ML SOAJ, INJECT 1 ML INTO THE SKIN EVERY 14 DAYS   sacubitril-valsartan (ENTRESTO ) 24-26 MG, TAKE 1 TABLET BY MOUTH TWICE A DAY  Current Outpatient Medications (Respiratory):    montelukast  (SINGULAIR ) 10 MG tablet, TAKE 1 TABLET BY MOUTH EVERYDAY AT BEDTIME   Current Outpatient Medications (Hematological):    clopidogrel  (PLAVIX ) 75 MG tablet, TAKE 1 TABLET BY MOUTH EVERY DAY   Ferrous Sulfate (IRON) 325 (65 Fe) MG TABS, Take 1 tablet by mouth. Taking three times a week  Current Outpatient Medications (Other):    ALPRAZolam  (XANAX ) 0.5 MG tablet, Take 0.5 mg by mouth as needed for anxiety (for flying).   Cholecalciferol  2000 UNITS CAPS, Take 2,000 Units by mouth every evening.   Diclofenac  Sodium 1.5 % SOLN, Apply 1-2 Pump topically 2 (two) times daily as needed.   Turmeric 500 MG CAPS, Take 1 capsule by mouth at bedtime.   Review of Systems:  No headache, visual changes, nausea, vomiting, diarrhea, constipation, dizziness, abdominal pain, skin rash, fevers, chills, night sweats, weight loss, swollen lymph nodes, body aches, joint swelling, chest pain, shortness of breath, mood changes. POSITIVE muscle aches  Objective  Blood pressure 120/72, pulse (!) 50, height 6\' 1"  (1.854 m), weight 181 lb (82.1 kg), SpO2 98%.   General: No apparent distress alert and oriented x3 mood and affect normal, dressed appropriately.  HEENT: Pupils equal, extraocular movements intact  Respiratory: Patient's speak in full sentences and does not appear short of breath  Cardiovascular: No lower extremity edema, non tender, no erythema  Low back does have some loss of lordosis noted.  Some tenderness to palpation in the paraspinal musculature.  Some tightness noted in the hip area.  Seems to be more with FABER than with straight leg test.    Impression and  Recommendations:    The above documentation has been reviewed and is accurate and complete Rajeev Escue M Arnika Larzelere, DO

## 2024-02-17 NOTE — Patient Instructions (Signed)
Good to see you   

## 2024-03-26 ENCOUNTER — Other Ambulatory Visit: Payer: Self-pay | Admitting: Cardiovascular Disease

## 2024-03-26 DIAGNOSIS — E782 Mixed hyperlipidemia: Secondary | ICD-10-CM

## 2024-04-04 ENCOUNTER — Other Ambulatory Visit: Payer: Self-pay | Admitting: Cardiovascular Disease

## 2024-04-04 ENCOUNTER — Other Ambulatory Visit: Payer: Self-pay | Admitting: Nurse Practitioner

## 2024-04-04 DIAGNOSIS — J4520 Mild intermittent asthma, uncomplicated: Secondary | ICD-10-CM

## 2024-04-20 ENCOUNTER — Telehealth: Payer: Self-pay | Admitting: Cardiovascular Disease

## 2024-04-20 NOTE — Telephone Encounter (Signed)
*  STAT* If patient is at the pharmacy, call can be transferred to refill team.   1. Which medications need to be refilled? (please list name of each medication and dose if known)   clopidogrel  (PLAVIX ) 75 MG tablet   2. Would you like to learn more about the convenience, safety, & potential cost savings by using the [facility]?   3. Are you open to using the [facility] (Type [facility]. ).  4. Which pharmacy/location (including street and city if local pharmacy) is medication to be sent to?  [phone] [place] [place] [facility] [address]   5. Do they need a 30 day or 90 day supply?   90 day  Patient stated he is completely out of this medication.  Patient has appointment scheduled with PA M. Lenze on 9/22.

## 2024-04-21 MED ORDER — CLOPIDOGREL BISULFATE 75 MG PO TABS: 75.0000 mg | ORAL_TABLET | Freq: Every day | ORAL | 0 refills | Status: DC

## 2024-05-05 ENCOUNTER — Other Ambulatory Visit: Payer: Self-pay | Admitting: Cardiovascular Disease

## 2024-06-01 NOTE — Progress Notes (Signed)
 Cardiology Office Note:  .   Date:  06/08/2024  ID:  Richard Ellis, DOB April 07, 1946, MRN [id number] PCP: Garald Karlynn GAILS, MD  [facility] Providers Cardiologist:  Ozell Fell, MD    History of Present Illness: .   Richard Ellis is a 78 y.o. male with a hx of Coronary artery disease, Cath 2/17: Severe mid LAD stenosis >> DES to the mid LAD,Status post CABG 2018 (LIMA-LAD, SVG-D1),Patent foramen ovale status post surgical closure,Prior CVA,Cryptogenic CVA 2014 >> PFO with R>L shunt noted; treated with Plavix , Hyperlipidemia,, Bradycardia -no beta-blocker, Ischemic cardiomyopathy: mild LV dysfunction LVEF 45-50%  Patient comes in with his wife. Denies chest pain, dyspnea, palpitations, edema. He's a Sales executive, he swims, runs-some sprinting, weights, spin class, bikes. Baseline HR 40-44. 6 months of sexual dysfunction. Has had a dry cough for a couple years now but worse over the past few months. Worse with chocolate, onions, change in temperature, laying down, early am.      ROS:    Studies Reviewed: SABRA         Prior CV Studies:    07/2022     ECHOCARDIOGRAM REPORT       Patient Name:   Richard Ellis  Date of Exam: 07/31/2022  Medical Rec #:  [id number]     Height:       73.0 in  Accession #:    [id number]    Weight:       173.6 lb  Date of Birth:  06-17-1946     BSA:          2.026 m  Patient Age:    76 years      BP:           115/65 mmHg  Patient Gender: M             HR:           42 bpm.  Exam Location:  [address]   Procedure: 2D Echo, 3D Echo, Cardiac Doppler and Color Doppler   Indications:    I25.5 Ischemic Cardiomyopathy    History:        Patient has prior history of Echocardiogram examinations,  most                 recent 08/30/2021. CAD, Prior CABG,  Arrythmias:Bradycardia; Risk                  Factors:HLD. PFO.    Sonographer:    Waldo Guadalajara RCS  Referring Phys: [phone] MICHAEL COOPER   IMPRESSIONS     1. Left ventricular ejection  fraction, by estimation, is 45 to 50%. The  left ventricle has mildly decreased function. There is mild left  ventricular hypertrophy. Left ventricular diastolic parameters are  indeterminate.   2. Right ventricular systolic function is mildly reduced. The right  ventricular size is normal. There is normal pulmonary artery systolic  pressure.   3. Left atrial size was mildly dilated.   4. No evidence of mitral valve regurgitation.   5. Aortic valve regurgitation is not visualized.   6. Aneurysm of the ascending aorta, measuring 41 mm.   7. The inferior vena cava is normal in size with greater than 50%  respiratory variability, suggesting right atrial pressure of 3 mmHg.   Comparison(s): No significant change from prior study.    Risk Assessment/Calculations:             Physical Exam:   VS:  BP (!) 100/52 (BP Location: Left Arm, Patient Position: Sitting, Cuff Size: Normal)   Pulse (!) 44   Ht 6' 1 (1.854 m)   Wt 182 lb (82.6 kg)   SpO2 98%   BMI 24.01 kg/m    Orhtostatics: No data found. Wt Readings from Last 3 Encounters:  06/08/24 182 lb (82.6 kg)  02/17/24 181 lb (82.1 kg)  10/08/23 184 lb (83.5 kg)    GEN: Well nourished, well developed in no acute distress NECK: No JVD; No carotid bruits CARDIAC:  RRR, no murmurs, rubs, gallops RESPIRATORY:  Clear to auscultation without rales, wheezing or rhonchi  ABDOMEN: Soft, non-tender, non-distended EXTREMITIES:  No edema; No deformity   ASSESSMENT AND PLAN: .     Coronary artery disease Cath 2/17: Severe mid LAD stenosis >> DES to the mid LAD Status post CABG 2018 (LIMA-LAD, SVG-D1) No angina, very active Continue Plavix , praluent  Check labs today  Ischemic cardiomyopathy: mild LV dysfunction LVEF 45-50% -euvolemic. Repeat echo, hasn't been able to get Farxiga  for 2 weeks. Will reach out to pharmacy team.  Aortic aneurysm 41 mm 2023. Repeat echo  Patent foramen ovale status post surgical closure  Prior  CVA Cryptogenic CVA 2014 >> PFO with R>L shunt noted; treated with Plavix   Hyperlipidemia Intol of statins, PCSK9i  >> Diet Rx  Hypothyroidism-on synthroid -check TSH  Bradycardia -no beta-blocker, senior athlete  Sexual dysfunction-needs to f/u with urology  Chronic dry cough for years but worse past few months. Try singular and zyrtec. Had been on singular in the past but stopped.          Dispo: f/u in 1 yr  Signed, Olivia Pavy, PA-C

## 2024-06-04 ENCOUNTER — Telehealth: Payer: Self-pay | Admitting: Cardiovascular Disease

## 2024-06-04 NOTE — Telephone Encounter (Signed)
 Pt c/o medication issue:  1. Name of Medication:   dapagliflozin  propanediol (FARXIGA ) 10 MG TABS tablet    2. How are you currently taking this medication (dosage and times per day)? TAKE 1 TABLET BY MOUTH DAILY BEFORE BREAKFAST.   3. Are you having a reaction (difficulty breathing--STAT)? No  4. What is your medication issue? Pt needs prior auth approval from insurance so he can pick up medication at [facility] on [address]. Pt has been out of medication for two weeks.

## 2024-06-08 ENCOUNTER — Telehealth: Payer: Self-pay | Admitting: Pharmacy Technician

## 2024-06-08 ENCOUNTER — Ambulatory Visit: Attending: Physician Assistant | Admitting: Physician Assistant

## 2024-06-08 ENCOUNTER — Other Ambulatory Visit (HOSPITAL_COMMUNITY): Payer: Self-pay

## 2024-06-08 ENCOUNTER — Encounter: Payer: Self-pay | Admitting: Physician Assistant

## 2024-06-08 VITALS — BP 100/52 | HR 44 | Ht 73.0 in | Wt 182.0 lb

## 2024-06-08 DIAGNOSIS — R053 Chronic cough: Secondary | ICD-10-CM | POA: Insufficient documentation

## 2024-06-08 DIAGNOSIS — I255 Ischemic cardiomyopathy: Secondary | ICD-10-CM | POA: Insufficient documentation

## 2024-06-08 DIAGNOSIS — I7121 Aneurysm of the ascending aorta, without rupture: Secondary | ICD-10-CM | POA: Insufficient documentation

## 2024-06-08 DIAGNOSIS — R37 Sexual dysfunction, unspecified: Secondary | ICD-10-CM | POA: Diagnosis not present

## 2024-06-08 DIAGNOSIS — I25119 Atherosclerotic heart disease of native coronary artery with unspecified angina pectoris: Secondary | ICD-10-CM | POA: Insufficient documentation

## 2024-06-08 DIAGNOSIS — R001 Bradycardia, unspecified: Secondary | ICD-10-CM | POA: Insufficient documentation

## 2024-06-08 DIAGNOSIS — E039 Hypothyroidism, unspecified: Secondary | ICD-10-CM | POA: Insufficient documentation

## 2024-06-08 DIAGNOSIS — Z8673 Personal history of transient ischemic attack (TIA), and cerebral infarction without residual deficits: Secondary | ICD-10-CM | POA: Insufficient documentation

## 2024-06-08 DIAGNOSIS — Q2112 Patent foramen ovale: Secondary | ICD-10-CM | POA: Diagnosis not present

## 2024-06-08 DIAGNOSIS — E782 Mixed hyperlipidemia: Secondary | ICD-10-CM | POA: Insufficient documentation

## 2024-06-08 LAB — CBC

## 2024-06-08 NOTE — Patient Instructions (Signed)
 Medication Instructions:  Your physician recommends that you continue on your current medications as directed. Please refer to the Current Medication list given to you today.  *If you need a refill on your cardiac medications before your next appointment, please call your pharmacy*  Lab Work: TODAY- CMET, CBC, FASTING LIPIDs, TSH If you have labs (blood work) drawn today and your tests are completely normal, you will receive your results only by: MyChart Message (if you have MyChart) OR A paper copy in the mail If you have any lab test that is abnormal or we need to change your treatment, we will call you to review the results.  Testing/Procedures: Your physician has requested that you have an echocardiogram. Echocardiography is a painless test that uses sound waves to create images of your heart. It provides your doctor with information about the size and shape of your heart and how well your heart's chambers and valves are working. This procedure takes approximately one hour. There are no restrictions for this procedure. Please do NOT wear cologne, perfume, aftershave, or lotions (deodorant is allowed). Please arrive 15 minutes prior to your appointment time.  Please note: We ask at that you not bring children with you during ultrasound (echo/ vascular) testing. Due to room size and safety concerns, children are not allowed in the ultrasound rooms during exams. Our front office staff cannot provide observation of children in our lobby area while testing is being conducted. An adult accompanying a patient to their appointment will only be allowed in the ultrasound room at the discretion of the ultrasound technician under special circumstances. We apologize for any inconvenience.   Follow-Up: At [facility], you and your health needs are our priority.  As part of our continuing mission to provide you with exceptional heart care, our providers are all part of one team.  This team includes  your primary Cardiologist (physician) and Advanced Practice Providers or APPs (Physician Assistants and Nurse Practitioners) who all work together to provide you with the care you need, when you need it.  Your next appointment:   1 year(s)  Provider:   Ozell Fell, MD

## 2024-06-08 NOTE — Telephone Encounter (Signed)
  Rejects for brand and generic  Pharmacy Patient Advocate Encounter   Received notification from staff-michele lenze/melissa that prior authorization for farxiga  10mg  is required/requested.   Insurance verification completed.   The patient is insured through [facility] .   Per test claim: PA required; PA submitted to above mentioned insurance via Latent Key/confirmation #/EOC [facility] Status is pending   Pharmacy Patient Advocate Encounter  Received notification from [facility] that Prior Authorization for farxiga  10mg  has been APPROVED from 06/08/24 to 06/08/25. Spoke to pharmacy to process.Copay is $86.00 for 3 months.    PA #/Case ID/Reference #: [id number]

## 2024-06-09 ENCOUNTER — Ambulatory Visit: Payer: Self-pay | Admitting: Physician Assistant

## 2024-06-09 LAB — CBC
Hematocrit: 41 % (ref 37.5–51.0)
Hemoglobin: 13.3 g/dL (ref 13.0–17.7)
MCH: 31.8 pg (ref 26.6–33.0)
MCHC: 32.4 g/dL (ref 31.5–35.7)
MCV: 98 fL — AB (ref 79–97)
Platelets: 187 x10E3/uL (ref 150–450)
RBC: 4.18 x10E6/uL (ref 4.14–5.80)
RDW: 12.7 % (ref 11.6–15.4)
WBC: 4.6 x10E3/uL (ref 3.4–10.8)

## 2024-06-09 LAB — COMPREHENSIVE METABOLIC PANEL WITH GFR
ALT: 20 IU/L (ref 0–44)
AST: 22 IU/L (ref 0–40)
Albumin: 4.5 g/dL (ref 3.8–4.8)
Alkaline Phosphatase: 54 IU/L (ref 47–123)
BUN/Creatinine Ratio: 18 (ref 10–24)
BUN: 21 mg/dL (ref 8–27)
Bilirubin Total: 0.5 mg/dL (ref 0.0–1.2)
CO2: 21 mmol/L (ref 20–29)
Calcium: 9.6 mg/dL (ref 8.6–10.2)
Chloride: 104 mmol/L (ref 96–106)
Creatinine, Ser: 1.18 mg/dL (ref 0.76–1.27)
Globulin, Total: 1.7 g/dL (ref 1.5–4.5)
Glucose: 93 mg/dL (ref 70–99)
Potassium: 4.7 mmol/L (ref 3.5–5.2)
Sodium: 139 mmol/L (ref 134–144)
Total Protein: 6.2 g/dL (ref 6.0–8.5)
eGFR: 63 mL/min/1.73 (ref >59)

## 2024-06-09 LAB — TSH: TSH: 3.5 u[IU]/mL (ref 0.450–4.500)

## 2024-06-09 LAB — LIPID PANEL
Chol/HDL Ratio: 4.6 ratio (ref 0.0–5.0)
Cholesterol, Total: 203 mg/dL — ABNORMAL HIGH (ref 100–199)
HDL: 44 mg/dL (ref >39)
LDL Chol Calc (NIH): 115 mg/dL — ABNORMAL HIGH (ref 0–99)
Triglycerides: 253 mg/dL — ABNORMAL HIGH (ref 0–149)
VLDL Cholesterol Cal: 44 mg/dL — ABNORMAL HIGH (ref 5–40)

## 2024-06-09 NOTE — Telephone Encounter (Signed)
 Lipid appointment with PharmD scheduled on Oct 2nd at 11:30.

## 2024-06-15 ENCOUNTER — Other Ambulatory Visit: Payer: Self-pay | Admitting: Cardiovascular Disease

## 2024-06-15 DIAGNOSIS — E782 Mixed hyperlipidemia: Secondary | ICD-10-CM

## 2024-06-15 DIAGNOSIS — I25119 Atherosclerotic heart disease of native coronary artery with unspecified angina pectoris: Secondary | ICD-10-CM

## 2024-06-16 ENCOUNTER — Ambulatory Visit: Payer: Medicare Other

## 2024-06-16 VITALS — Ht 73.0 in | Wt 182.0 lb

## 2024-06-16 DIAGNOSIS — Z0000 Encounter for general adult medical examination without abnormal findings: Secondary | ICD-10-CM

## 2024-06-16 NOTE — Progress Notes (Cosign Needed Addendum)
 Subjective:   Richard Ellis is a 78 y.o. who presents for a Medicare Wellness preventive visit.  As a reminder, Annual Wellness Visits don't include a physical exam, and some assessments may be limited, especially if this visit is performed virtually. We may recommend an in-person follow-up visit with your provider if needed.  Visit Complete: Virtual I connected with  Richard Ellis on 06/16/24 by a audio enabled telemedicine application and verified that I am speaking with the correct person using two identifiers.  Patient Location: Home  Provider Location: Office/Clinic  I discussed the limitations of evaluation and management by telemedicine. The patient expressed understanding and agreed to proceed.  Vital Signs: Because this visit was a virtual/telehealth visit, some criteria may be missing or patient reported. Any vitals not documented were not able to be obtained and vitals that have been documented are patient reported.  VideoDeclined- This patient declined Librarian, academic. Therefore the visit was completed with audio only.  Persons Participating in Visit: Patient.  AWV Questionnaire: Yes: Patient Medicare AWV questionnaire was completed by the patient on 06/16/2024; I have confirmed that all information answered by patient is correct and no changes since this date.  Cardiac Risk Factors include: hypertension;male gender;advanced age (>82men, >56 women);dyslipidemia     Objective:    Today's Vitals   06/16/24 1352  Weight: 182 lb (82.6 kg)  Height: 6' 1 (1.854 m)   Body mass index is 24.01 kg/m.     06/16/2024    1:52 PM 06/11/2023    1:51 PM 04/06/2022    3:34 PM 08/21/2021    8:29 PM 03/30/2021    9:56 AM 10/11/2016    8:19 AM 10/09/2016    1:05 PM  Advanced Directives  Does Patient Have a Medical Advance Directive? Yes Yes Yes Yes Yes Yes  Yes   Type of Estate agent of [place];Living will Healthcare Power of  [place];Living will Healthcare Power of [place];Living will Living will;Healthcare Power of Attorney Living will;Healthcare Power of [employer] Power of [place];Living will  Does patient want to make changes to medical advance directive?     No - Patient declined  No - Patient declined   Copy of Healthcare Power of Attorney in Chart? No - copy requested No - copy requested No - copy requested  No - copy requested  No - copy requested      Data saved with a previous flowsheet row definition    Current Medications (verified) Outpatient Encounter Medications as of 06/16/2024  Medication Sig   Alirocumab  (PRALUENT ) 75 MG/ML SOAJ INJECT 1 ML INTO THE SKIN EVERY 14 DAYS   ALPRAZolam  (XANAX ) 0.5 MG tablet Take 0.5 mg by mouth as needed for anxiety (for flying).   Cholecalciferol  2000 UNITS CAPS Take 2,000 Units by mouth every evening.   clopidogrel  (PLAVIX ) 75 MG tablet Take 1 tablet (75 mg total) by mouth daily. PLEASE KEEP UPCOMING SEPTEMBER APPOINTMENT TO RECEIVE ADDITIONAL REFILLS, THANK YOU!   Coenzyme Q10 (COQ10) 400 MG CAPS Take 300 mg by mouth.   dapagliflozin  propanediol (FARXIGA ) 10 MG TABS tablet TAKE 1 TABLET BY MOUTH DAILY BEFORE BREAKFAST.   Diclofenac  Sodium 1.5 % SOLN Apply 1-2 Pump topically 2 (two) times daily as needed.   Ferrous Sulfate (IRON) 325 (65 Fe) MG TABS Take 1 tablet by mouth. Taking three times a week   levothyroxine  (SYNTHROID ) 50 MCG tablet AS DIRECTED ORALLY ONCE A DAY FOR 6 DAYS AND SKIP ON  SUNDAYS 90 DAYS   sacubitril-valsartan (ENTRESTO ) 24-26 MG TAKE 1 TABLET BY MOUTH TWICE A DAY   montelukast  (SINGULAIR ) 10 MG tablet TAKE 1 TABLET BY MOUTH EVERYDAY AT BEDTIME (Patient not taking: Reported on 06/16/2024)   Turmeric 500 MG CAPS Take 1 capsule by mouth at bedtime. (Patient not taking: Reported on 06/16/2024)   No facility-administered encounter medications on file as of 06/16/2024.    Allergies (verified) Niacin  er (antihyperlipidemic), Statins,  Atorvastatin, Crestor  [rosuvastatin ], Demerol [meperidine], Niacin  and related, and Repatha  [evolocumab ]   History: Past Medical History:  Diagnosis Date   Allergy    rhinitis   Anemia    Hgb always on the low side (07/02/2013)   CAD (coronary artery disease)    a. LHC 2/17: pLAD 50, mLAD 95, pRCA 30, dRCA 25, EF 50-55% >> PCI:  3 x 18 mm Resolute DES to mLAD    Chronic bronchitis (HCC)    not in the last year since I started taking allergy shots (10/09/2016)   Chronic lower back pain    Chronic sinus bradycardia 07/02/2013   Coronary artery disease 10/15/2016   Cryptogenic stroke (HCC) 07/02/2013   a. small ischemic occipital right sided (07/02/2013)  //  b. Event monitor 10/14: sinus brady  //  c. Carotid US  10/14: bilat ICA 1-39%   Esophageal stricture    Exertional angina 10/09/2016   Heart murmur    as a child (07/02/2013)   History of echocardiogram    a. Echo 10/14: EF 60-65%, no RWMA, normal diastolic function   Hyperlipidemia    Hypothyroidism    Nonallopathic lesion of lumbosacral region 06/04/2017   Nonallopathic lesion of sacral region 06/04/2017   Nonallopathic lesion of thoracic region 06/04/2017   Osteoarthritis    lower back (10/09/2016)   PFO (patent foramen ovale)    a. TEE 11/14: mild LVH, EF 60-65%, small PFO, R-L shunt   Past Surgical History:  Procedure Laterality Date   CARDIAC CATHETERIZATION N/A 11/10/2015   Procedure: Left Heart Cath and Coronary Angiography;  Surgeon: Ozell Fell, MD;  Location: [facility] INVASIVE CV LAB;  Service: Cardiovascular;  Laterality: N/A;   CARDIAC CATHETERIZATION N/A 11/10/2015   Procedure: Coronary Stent Intervention;  Surgeon: Ozell Fell, MD;  Location: [facility] INVASIVE CV LAB;  Service: Cardiovascular;  Laterality: N/A;  mid lad  3.0x18 resolute   CARDIAC CATHETERIZATION  10/09/2016   scheduled OHS for tomorrow (10/09/2016)   CARDIAC CATHETERIZATION N/A 10/09/2016   Procedure: Left Heart Cath and Coronary Angiography;   Surgeon: Ozell Fell, MD;  Location: [facility] INVASIVE CV LAB;  Service: Cardiovascular;  Laterality: N/A;   CARDIAC CATHETERIZATION N/A 10/09/2016   Procedure: Coronary Balloon Angioplasty;  Surgeon: Ozell Fell, MD;  [facility] INVASIVE CV LAB;  Service: Cardiovascular;  Laterality: N/A;   CORONARY ARTERY BYPASS GRAFT N/A 10/15/2016   Procedure: CORONARY ARTERY BYPASS GRAFTING (CABG), ON PUMP, TIMES TWO, USING LEFT INTERNAL MAMMARY ARTERY AND RIGHT GREATER SAPHENOUS VEIN HARVESTED ENDOSCOPICALLY;  Surgeon: Maude Fleeta Ochoa, MD;  Location: [facility] OR;  Service: Open Heart Surgery;  Laterality: N/A;  LIMA to LAD, SVG to DIAGONAL   ESOPHAGOGASTRODUODENOSCOPY N/A 10/26/2012   Procedure: ESOPHAGOGASTRODUODENOSCOPY (EGD);  Surgeon: Lynwood LITTIE Celestia Mickey., MD;  Location: [facility] ENDOSCOPY;  Service: Endoscopy;  Laterality: N/A;   ESOPHAGOGASTRODUODENOSCOPY N/A 07/08/2013   Procedure: ESOPHAGOGASTRODUODENOSCOPY (EGD);  Surgeon: Lynwood LITTIE Celestia Mickey., MD;  Location: [facility];  Service: Endoscopy;  Laterality: N/A;   INNER EAR SURGERY Left 1957   related to ear infection  REPAIR OF PATENT FORAMEN OVALE N/A 10/15/2016   Procedure: REPAIR OF PATENT FORAMEN OVALE;  Surgeon: Maude Fleeta Ochoa, MD;  Location: [facility] OR;  Service: Open Heart Surgery;  Laterality: N/A;   SAVORY DILATION N/A 07/08/2013   Procedure: SAVORY DILATION;  Surgeon: Lynwood LITTIE Celestia Mickey., MD;  Location: [facility] ENDOSCOPY;  Service: Endoscopy;  Laterality: N/A;   TEE WITHOUT CARDIOVERSION N/A 07/28/2013   Procedure: TRANSESOPHAGEAL ECHOCARDIOGRAM (TEE);  Surgeon: Vinie KYM Maxcy, MD;  Location: [facility] ENDOSCOPY;  Service: Cardiovascular;  Laterality: N/A;   TEE WITHOUT CARDIOVERSION N/A 10/15/2016   Procedure: TRANSESOPHAGEAL ECHOCARDIOGRAM (TEE);  Surgeon: Maude Fleeta Ochoa, MD;  Location: [facility] OR;  Service: Open Heart Surgery;  Laterality: N/A;   TONSILLECTOMY  1950's   Family History  Problem Relation Age of Onset   Coronary artery disease Other        male  first degree relative <60   Heart disease Father        heart attack   COPD Mother    Heart disease Paternal Uncle    Colon cancer Neg Hx    Social History   Socioeconomic History   Marital status: Married    Spouse name: Aleck   Number of children: 1   Years of education: Not on file   Highest education level: Master's degree (e.g., MA, MS, MEng, MEd, MSW, MBA)  Occupational History   Occupation: Retired  Tobacco Use   Smoking status: Never   Smokeless tobacco: Never  Vaping Use   Vaping status: Never Used  Substance and Sexual Activity   Alcohol use: Yes    Alcohol/week: 8.0 standard drinks of alcohol    Types: 4 Glasses of wine, 4 Cans of beer per week    Comment: weekly   Drug use: No   Sexual activity: Yes  Other Topics Concern   Not on file  Social History Narrative   Lives with wife   Social Drivers of Health   Financial Resource Strain: Low Risk  (06/16/2024)   Overall Financial Resource Strain (CARDIA)    Difficulty of Paying Living Expenses: Not very hard  Food Insecurity: No Food Insecurity (06/16/2024)   Hunger Vital Sign    Worried About Running Out of Food in the Last Year: Never true    Ran Out of Food in the Last Year: Never true  Transportation Needs: No Transportation Needs (06/16/2024)   PRAPARE - Administrator, Civil Service (Medical): No    Lack of Transportation (Non-Medical): No  Physical Activity: Sufficiently Active (06/16/2024)   Exercise Vital Sign    Days of Exercise per Week: 6 days    Minutes of Exercise per Session: 40 min  Stress: No Stress Concern Present (06/16/2024)   [employer] of Occupational Health - Occupational Stress Questionnaire    Feeling of Stress: Only a little  Social Connections: Socially Integrated (06/16/2024)   Social Connection and Isolation Panel    Frequency of Communication with Friends and Family: Once a week    Frequency of Social Gatherings with Friends and Family: Twice a week     Attends Religious Services: More than 4 times per year    Active Member of [employer] or Organizations: Yes    Attends Engineer, structural: More than 4 times per year    Marital Status: Married    Tobacco Counseling Counseling given: Not Answered    Clinical Intake:  Pre-visit preparation completed: Yes  Pain : No/denies pain  BMI - recorded: 24.01 Nutritional Status: BMI of 19-24  Normal Nutritional Risks: None Diabetes: No  Lab Results  Component Value Date   HGBA1C 5.6 10/11/2016   HGBA1C 5.8 02/02/2014   HGBA1C 6.1 (H) 07/03/2013     How often do you need to have someone help you when you read instructions, pamphlets, or other written materials from your doctor or pharmacy?: 1 - Never  Interpreter Needed?: No  Information entered by :: Verdie Saba, CMA   Activities of Daily Living     06/16/2024    1:55 PM 06/16/2024    8:51 AM  In your present state of health, do you have any difficulty performing the following activities:  Hearing? 0 0  Vision? 0 0  Difficulty concentrating or making decisions? 0 0  Walking or climbing stairs? 0 0  Dressing or bathing? 0 0  Doing errands, shopping? 0 0  Preparing Food and eating ? N N  Using the Toilet? N N  In the past six months, have you accidently leaked urine? N N  Do you have problems with loss of bowel control? N N  Managing your Medications? N N  Managing your Finances? N N  Housekeeping or managing your Housekeeping? N N    Patient Care Team: Plotnikov, Karlynn GAILS, MD as PCP - Diedre Wonda Sharper, MD as PCP - Cardiology (Cardiology) Alline Lenis, MD (Inactive) as Consulting Physician (Urology) Joshua Rush, OD as Consulting Physician (Optometry)  I have updated your Care Teams any recent Medical Services you may have received from other providers in the past year.     Assessment:   This is a routine wellness examination for Richard Ellis.  Hearing/Vision screen Hearing Screening - Comments::  Denies hearing difficulties   Vision Screening - Comments:: Wears rx glasses - up to date with routine eye exams with [facility]   Goals Addressed               This Visit's Progress     Patient Stated (pt-stated)        Patient stated he plans to continue exercising and stretches       Depression Screen     06/16/2024    1:56 PM 06/17/2023    2:18 PM 06/11/2023    1:54 PM 04/06/2022    3:37 PM 04/06/2022    3:34 PM 04/06/2022    3:33 PM 11/14/2021    1:40 PM  PHQ 2/9 Scores  PHQ - 2 Score 0 0 0 0 0 0 0  PHQ- 9 Score 0 0 0        Fall Risk     06/16/2024    1:56 PM 06/16/2024    8:51 AM 06/17/2023    2:18 PM 06/04/2023    2:29 PM 04/06/2022    3:34 PM  Fall Risk   Falls in the past year? 0 0 0 0 0  Number falls in past yr: 0 0 0 0 0  Injury with Fall? 0 0 0 0 0  Risk for fall due to : No Fall Risks  Other (Comment)    Follow up Falls evaluation completed;Falls prevention discussed  Falls evaluation completed Falls evaluation completed;Falls prevention discussed Falls evaluation completed;Education provided      Data saved with a previous flowsheet row definition    MEDICARE RISK AT HOME:  Medicare Risk at Home If so, are there any without handrails?: No Home free of loose throw rugs in walkways, pet beds, electrical cords,  etc?: Yes Adequate lighting in your home to reduce risk of falls?: Yes Life alert?: No Use of a cane, walker or w/c?: No Grab bars in the bathroom?: No Shower chair or bench in shower?: No Elevated toilet seat or a handicapped toilet?: No  TIMED UP AND GO:  Was the test performed?  No  Cognitive Function: 6CIT completed        06/16/2024    2:04 PM 06/11/2023    1:52 PM  6CIT Screen  What Year? 0 points 0 points  What month? 0 points 0 points  What time? 0 points 0 points  Count back from 20 0 points 0 points  Months in reverse 0 points 0 points  Repeat phrase 0 points 0 points  Total Score 0 points 0 points     Immunizations Immunization History  Administered Date(s) Administered   DTaP 02/03/2011   Fluad Quad(high Dose 65+) 05/17/2020, 06/14/2022   INFLUENZA, HIGH DOSE SEASONAL PF 07/20/2016, 05/29/2017, 05/29/2018, 05/28/2019, 06/17/2021   Influenza, Quadrivalent, Recombinant, Inj, Pf 05/24/2020   Influenza-Unspecified 06/23/2015   PFIZER(Purple Top)SARS-COV-2 Vaccination 10/08/2019, 10/29/2019, 07/06/2020, 04/12/2021   Pneumococcal Conjugate-13 04/19/2016   Pneumococcal Polysaccharide-23 08/11/2012   Respiratory Syncytial Virus Vaccine,Recomb Aduvanted(Arexvy) 06/27/2022   Tdap 11/18/2006    Screening Tests Health Maintenance  Topic Date Due   Hepatitis C Screening  Never done   Zoster Vaccines- Shingrix (1 of 2) Never done   DTaP/Tdap/Td (3 - Td or Tdap) 02/02/2021   Influenza Vaccine  04/17/2024   COVID-19 Vaccine (5 - 2025-26 season) 05/18/2024   Medicare Annual Wellness (AWV)  06/16/2025   Pneumococcal Vaccine: 50+ Years  Completed   HPV VACCINES  Aged Out   Meningococcal B Vaccine  Aged Out   Colonoscopy  Discontinued    Health Maintenance Items Addressed:  06/16/2024  Additional Screening:  Vision Screening: Recommended annual ophthalmology exams for early detection of glaucoma and other disorders of the eye. Is the patient up to date with their annual eye exam?  Yes  Who is the provider or what is the name of the office in which the patient attends annual eye exams? [facility] w/Dr Norleen Molt  Dental Screening: Recommended annual dental exams for proper oral hygiene  Community Resource Referral / Chronic Care Management: CRR required this visit?  No   CCM required this visit?  No   Plan:    I have personally reviewed and noted the following in the patient's chart:   Medical and social history Use of alcohol, tobacco or illicit drugs  Current medications and supplements including opioid prescriptions. Patient is not currently taking opioid  prescriptions. Functional ability and status Nutritional status Physical activity Advanced directives List of other physicians Hospitalizations, surgeries, and ER visits in previous 12 months Vitals Screenings to include cognitive, depression, and falls Referrals and appointments  In addition, I have reviewed and discussed with patient certain preventive protocols, quality metrics, and best practice recommendations. A written personalized care plan for preventive services as well as general preventive health recommendations were provided to patient.   Verdie CHRISTELLA Saba, CMA   06/16/2024   After Visit Summary: (MyChart) Due to this being a telephonic visit, the after visit summary with patients personalized plan was offered to patient via MyChart   Notes: Scheduled a 1-yr Physical w/PCP for 06/2024  Medical screening examination/treatment/procedure(s) were performed by non-physician practitioner and as supervising physician I was immediately available for consultation/collaboration.  I agree with above. Karlynn Noel, MD

## 2024-06-16 NOTE — Patient Instructions (Addendum)
 Richard Ellis,  Thank you for taking the time for your Medicare Wellness Visit. I appreciate your continued commitment to your health goals. Please review the care plan we discussed, and feel free to reach out if I can assist you further.  Medicare recommends these wellness visits once per year to help you and your care team stay ahead of potential health issues. These visits are designed to focus on prevention, allowing your provider to concentrate on managing your acute and chronic conditions during your regular appointments.  Please note that Annual Wellness Visits do not include a physical exam. Some assessments may be limited, especially if the visit was conducted virtually. If needed, we may recommend a separate in-person follow-up with your provider.  Ongoing Care Seeing your primary care provider every 3 to 6 months helps us  monitor your health and provide consistent, personalized care.   Referrals If a referral was made during today's visit and you haven't received any updates within two weeks, please contact the referred provider directly to check on the status.  Recommended Screenings:  Health Maintenance  Topic Date Due   Hepatitis C Screening  Never done   Zoster (Shingles) Vaccine (1 of 2) Never done   DTaP/Tdap/Td vaccine (3 - Td or Tdap) 02/02/2021   Flu Shot  04/17/2024   COVID-19 Vaccine (5 - 2025-26 season) 05/18/2024   Medicare Annual Wellness Visit  06/16/2025   Pneumococcal Vaccine for age over 30  Completed   HPV Vaccine  Aged Out   Meningitis B Vaccine  Aged Out   Colon Cancer Screening  Discontinued       06/16/2024    1:52 PM  Advanced Directives  Does Patient Have a Medical Advance Directive? Yes  Type of Estate agent of [place];Living will  Copy of Healthcare Power of Attorney in Chart? No - copy requested   Advance Care Planning is important because it: Ensures you receive medical care that aligns with your values, goals, and  preferences. Provides guidance to your family and loved ones, reducing the emotional burden of decision-making during critical moments.  Vision: Annual vision screenings are recommended for early detection of glaucoma, cataracts, and diabetic retinopathy. These exams can also reveal signs of chronic conditions such as diabetes and high blood pressure.  Dental: Annual dental screenings help detect early signs of oral cancer, gum disease, and other conditions linked to overall health, including heart disease and diabetes.

## 2024-06-18 ENCOUNTER — Other Ambulatory Visit (HOSPITAL_COMMUNITY): Payer: Self-pay

## 2024-06-18 ENCOUNTER — Ambulatory Visit: Attending: Internal Medicine | Admitting: Pharmacist

## 2024-06-18 ENCOUNTER — Encounter: Payer: Self-pay | Admitting: Pharmacist

## 2024-06-18 ENCOUNTER — Telehealth: Payer: Self-pay | Admitting: Pharmacy Technician

## 2024-06-18 DIAGNOSIS — E7849 Other hyperlipidemia: Secondary | ICD-10-CM | POA: Insufficient documentation

## 2024-06-18 MED ORDER — PRALUENT 150 MG/ML ~~LOC~~ SOAJ: 150.0000 mg | SUBCUTANEOUS | 2 refills | Status: DC

## 2024-06-18 MED ORDER — EZETIMIBE 10 MG PO TABS: 10.0000 mg | ORAL_TABLET | Freq: Every day | ORAL | 3 refills | Status: AC

## 2024-06-18 NOTE — Progress Notes (Signed)
 Patient ID: Richard Ellis                 DOB: 12-Mar-1946                    MRN: [id number]      HPI: Richard Ellis is a 78 y.o. male patient referred to lipid clinic by Dr.Copper. PMH is significant for hypertension, Coronary artery disease,s/p DES to the mid LAD2017,Status post CABG 2018 (LIMA-LAD, SVG-D1),Patent foramen ovale status post surgical closure,Prior CVA,Cryptogenic CVA 2014, Hyperlipidemia,, Bradycardia -no beta-blocker, Ischemic cardiomyopathy: mild LV dysfunction LVEF 45-50%   Patient presented for lipid clinic. Reports he did not fast for the last lipid lab so the TG level may not be accurate. He follow healthy diet and his lifestyle is very active. He likes to participate in 5 K runs and when he takes statins he is unable to due to myalgia. In the past he has tried multiple statins he only recall one name rosuvastatin . He tired even lowest dose 5 mg 3 times per week and was not abel to tolerate due to myalgia.  Reviewed options for lowering LDL cholesterol, including ezetimibe , PCSK-9 inhibitors, bempedoic acid.  Discussed mechanisms of action, dosing, side effects and potential decreases in LDL cholesterol.  Also reviewed cost information and potential options for patient assistance.   Current Medications: Praluent  75 mg [employer] every 14 days  Intolerances: statins - myalgia Crestor  and Lipitor, Repatha  - myalgia  Risk Factors: hypertension, CAD, TIA  LDL goal: <70 mg/dl TG <849 mg/dl  Last lab: TC 796, TG 746, LDLc 115, HDL 44 (06/08/2024) did not fast for the lab   Diet: B- oat meals with protein powder, low fat yogurt with fruit, toast with some butter and eggs, ancient grain mix  L- almond butter and jelly sandwich ( whole grain) glass of milk or left over dinner  D- some vegetables, fish or chicken ( grilled or baked)  Snacks: granola bar  Drink: water, 2 cups of coffee with some chocolate milk Eat out - once a week   Exercise: swimming 30 min on Monday, Tuesday- 4 miles  track, wed spin class 50 min followed by machine workout, Thursday - 2 miles walk with dog Friday - spin class, sat - long runs 5-7 miles, Sunday - 30 min spin class  5 miles race occasionally   Family History:  Relation Problem Comments  Mother Metallurgist) COPD     Father (Deceased) Heart disease heart attack at age 36     Brother Metallurgist)   Brother Metallurgist)   Paternal Uncle Heart disease  MI at age of 4     Maternal Grandmother (Deceased)   Maternal Grandfather (Deceased)   Paternal Grandmother (Deceased) Stroke in her 48's   Paternal Grandfather (Deceased)   Daughter Metallurgist)   Other - family hx of (Alive) Coronary artery disease male first degree relative <60     Social History:  Alcohol: 1 beer per day  Smoking: never  Labs:  Lipid Panel     Component Value Date/Time   CHOL 203 (H) 06/08/2024 1032   CHOL 230 (H) 11/09/2014 1200   TRIG 253 (H) 06/08/2024 1032   TRIG 152 (H) 11/09/2014 1200   TRIG 228 (HH) 07/30/2006 0741   HDL 44 06/08/2024 1032   HDL 64 11/09/2014 1200   CHOLHDL 4.6 06/08/2024 1032   CHOLHDL 4 06/20/2023 0819   VLDL 24.2 06/20/2023 0819   LDLCALC 115 (H) 06/08/2024 1032  LDLCALC 136 (H) 11/09/2014 1200   LDLDIRECT 106 (H) 12/21/2021 1503   LDLDIRECT 122.0 05/09/2015 1006   LABVLDL 44 (H) 06/08/2024 1032    Past Medical History:  Diagnosis Date   Allergy    rhinitis   Anemia    Hgb always on the low side (07/02/2013)   CAD (coronary artery disease)    a. LHC 2/17: pLAD 50, mLAD 95, pRCA 30, dRCA 25, EF 50-55% >> PCI:  3 x 18 mm Resolute DES to mLAD    Chronic bronchitis (HCC)    not in the last year since I started taking allergy shots (10/09/2016)   Chronic lower back pain    Chronic sinus bradycardia 07/02/2013   Coronary artery disease 10/15/2016   Cryptogenic stroke (HCC) 07/02/2013   a. small ischemic occipital right sided (07/02/2013)  //  b. Event monitor 10/14: sinus brady  //  c. Carotid US  10/14: bilat ICA 1-39%   Esophageal  stricture    Exertional angina 10/09/2016   Heart murmur    as a child (07/02/2013)   History of echocardiogram    a. Echo 10/14: EF 60-65%, no RWMA, normal diastolic function   Hyperlipidemia    Hypothyroidism    Nonallopathic lesion of lumbosacral region 06/04/2017   Nonallopathic lesion of sacral region 06/04/2017   Nonallopathic lesion of thoracic region 06/04/2017   Osteoarthritis    lower back (10/09/2016)   PFO (patent foramen ovale)    a. TEE 11/14: mild LVH, EF 60-65%, small PFO, R-L shunt    Current Outpatient Medications on File Prior to Visit  Medication Sig Dispense Refill   ALPRAZolam  (XANAX ) 0.5 MG tablet Take 0.5 mg by mouth as needed for anxiety (for flying).     Cholecalciferol  2000 UNITS CAPS Take 2,000 Units by mouth every evening.     clopidogrel  (PLAVIX ) 75 MG tablet Take 1 tablet (75 mg total) by mouth daily. PLEASE KEEP UPCOMING SEPTEMBER APPOINTMENT TO RECEIVE ADDITIONAL REFILLS, THANK YOU! 30 tablet 1   Coenzyme Q10 (COQ10) 400 MG CAPS Take 300 mg by mouth.     dapagliflozin  propanediol (FARXIGA ) 10 MG TABS tablet TAKE 1 TABLET BY MOUTH DAILY BEFORE BREAKFAST. 90 tablet 3   Diclofenac  Sodium 1.5 % SOLN Apply 1-2 Pump topically 2 (two) times daily as needed. 300 mL 0   Ferrous Sulfate (IRON) 325 (65 Fe) MG TABS Take 1 tablet by mouth. Taking three times a week     levothyroxine  (SYNTHROID ) 50 MCG tablet AS DIRECTED ORALLY ONCE A DAY FOR 6 DAYS AND SKIP ON SUNDAYS 90 DAYS 90 tablet 3   montelukast  (SINGULAIR ) 10 MG tablet TAKE 1 TABLET BY MOUTH EVERYDAY AT BEDTIME (Patient not taking: Reported on 06/16/2024) 90 tablet 2   sacubitril-valsartan (ENTRESTO ) 24-26 MG TAKE 1 TABLET BY MOUTH TWICE A DAY 180 tablet 0   Turmeric 500 MG CAPS Take 1 capsule by mouth at bedtime. (Patient not taking: Reported on 06/16/2024)     No current facility-administered medications on file prior to visit.    Allergies  Allergen Reactions   Niacin  Er (Antihyperlipidemic) Other (See  Comments)   Statins Other (See Comments)   Atorvastatin Other (See Comments)    Muscle weakness   Crestor  [Rosuvastatin ] Other (See Comments)    Muscle weakness   Demerol [Meperidine] Nausea Only   Niacin  And Related Itching, Rash and Other (See Comments)    Palms turned red and itched   Repatha  [Evolocumab ] Other (See Comments)    Muscle weakness  Assessment/Plan:  1. Hyperlipidemia -  Problem  Hyperlipidemia   Statin, Repatha  intolerant    Hyperlipidemia Assessment:  LDL goal: <70 mg/dl TG <849 mg/dl last LDLc 884 mg/dl TG 746 - pt did not fast for the lab and was on Praluent  75 mg every 14 days  Intolerance to statins due to myalgia and muscle weakness  Discussed next potential options (Praluent  higher dose, Zetia  , bempedoic acid and inclisiran); cost, dosing efficacy, side effects  Follows healthy diet and exercises regularly   Plan: Increase Praluent  dose from 75 mg to 150 mg every 14 days  Start taking Zetia  10 mg daily and will f/u via phone in a month and may consider adding bempedoic acid  F/u lipid lab mid Jan 2026     Thank you,  Robbi Blanch, Pharm.D  [facility]. [facility] [address], [place], [place] [address] Phone: [phone]; Fax: [phone]

## 2024-06-18 NOTE — Telephone Encounter (Signed)
  Still wont go on test claim -says needs pa  Trying again  Pharmacy Patient Advocate Encounter   Received notification from Pt Calls Messages that prior authorization for praluent  150mg  is required/requested.   Insurance verification completed.   The patient is insured through [facility].   Per test claim: PA required; PA submitted to above mentioned insurance via Latent Key/confirmation #/EOC [id number] Status is pending

## 2024-06-18 NOTE — Assessment & Plan Note (Addendum)
 Assessment:  LDL goal: <70 mg/dl TG <849 mg/dl last LDLc 884 mg/dl TG 746 - pt did not fast for the lab and was on Praluent  75 mg every 14 days  Intolerance to statins due to myalgia and muscle weakness  Discussed next potential options (Praluent  higher dose, Zetia  , bempedoic acid and inclisiran); cost, dosing efficacy, side effects  Follows healthy diet and exercises regularly   Plan: Increase Praluent  dose from 75 mg to 150 mg every 14 days  Start taking Zetia  10 mg daily and will f/u via phone in a month and may consider adding bempedoic acid  F/u lipid lab mid Jan 2026

## 2024-06-18 NOTE — Telephone Encounter (Signed)
   Pharmacy Patient Advocate Encounter   Received notification from Onbase that prior authorization for PRALUENT  is required/requested.   Insurance verification completed.   The patient is insured through [facility].   Per test claim: PA required; PA started via CoverMyMeds. KEY [id number] . Waiting for clinical questions to populate.

## 2024-06-22 NOTE — Telephone Encounter (Signed)
 Pharmacy Patient Advocate Encounter  Received notification from [facility] that Prior Authorization for praluent  has been APPROVED from 06/19/24 to 06/19/25   PA #/Case ID/Reference #: [id number]

## 2024-06-30 ENCOUNTER — Ambulatory Visit: Admitting: Internal Medicine

## 2024-06-30 ENCOUNTER — Encounter: Payer: Self-pay | Admitting: Internal Medicine

## 2024-06-30 VITALS — BP 108/66 | HR 45 | Temp 97.9°F | Ht 73.0 in | Wt 181.0 lb

## 2024-06-30 DIAGNOSIS — G459 Transient cerebral ischemic attack, unspecified: Secondary | ICD-10-CM | POA: Diagnosis not present

## 2024-06-30 DIAGNOSIS — E039 Hypothyroidism, unspecified: Secondary | ICD-10-CM | POA: Diagnosis not present

## 2024-06-30 DIAGNOSIS — I10 Essential (primary) hypertension: Secondary | ICD-10-CM | POA: Diagnosis not present

## 2024-06-30 DIAGNOSIS — I25810 Atherosclerosis of coronary artery bypass graft(s) without angina pectoris: Secondary | ICD-10-CM | POA: Diagnosis not present

## 2024-06-30 DIAGNOSIS — M51362 Other intervertebral disc degeneration, lumbar region with discogenic back pain and lower extremity pain: Secondary | ICD-10-CM | POA: Diagnosis not present

## 2024-06-30 DIAGNOSIS — R972 Elevated prostate specific antigen [PSA]: Secondary | ICD-10-CM

## 2024-06-30 DIAGNOSIS — Z0000 Encounter for general adult medical examination without abnormal findings: Secondary | ICD-10-CM

## 2024-06-30 DIAGNOSIS — Z1211 Encounter for screening for malignant neoplasm of colon: Secondary | ICD-10-CM

## 2024-06-30 LAB — URINALYSIS
Bilirubin Urine: NEGATIVE
Hgb urine dipstick: NEGATIVE
Ketones, ur: NEGATIVE
Leukocytes,Ua: NEGATIVE
Nitrite: NEGATIVE
Specific Gravity, Urine: 1.02 (ref 1.000–1.030)
Total Protein, Urine: NEGATIVE
Urine Glucose: >=1000 — AB
Urobilinogen, UA: 0.2 (ref 0.0–1.0)
pH: 5.5 (ref 5.0–8.0)

## 2024-06-30 LAB — PSA: PSA: 4.12 ng/mL — ABNORMAL HIGH (ref 0.10–4.00)

## 2024-06-30 NOTE — Assessment & Plan Note (Addendum)

## 2024-06-30 NOTE — Progress Notes (Signed)
 Subjective:  Patient ID: Richard Ellis, male    DOB: Mar 21, 1946  Age: 78 y.o. MRN: [id number]  CC: Annual Exam (Annual Exam)   HPI Richard Ellis presents for a well exam  Outpatient Medications Prior to Visit  Medication Sig Dispense Refill   Alirocumab  (PRALUENT ) 150 MG/ML SOAJ Inject 1 mL (150 mg total) into the skin every 14 (fourteen) days. 2 mL 2   ALPRAZolam  (XANAX ) 0.5 MG tablet Take 0.5 mg by mouth as needed for anxiety (for flying).     Cholecalciferol  2000 UNITS CAPS Take 2,000 Units by mouth every evening.     clopidogrel  (PLAVIX ) 75 MG tablet Take 1 tablet (75 mg total) by mouth daily. PLEASE KEEP UPCOMING SEPTEMBER APPOINTMENT TO RECEIVE ADDITIONAL REFILLS, THANK YOU! 30 tablet 1   Coenzyme Q10 (COQ10) 400 MG CAPS Take 300 mg by mouth.     dapagliflozin  propanediol (FARXIGA ) 10 MG TABS tablet TAKE 1 TABLET BY MOUTH DAILY BEFORE BREAKFAST. 90 tablet 3   Diclofenac  Sodium 1.5 % SOLN Apply 1-2 Pump topically 2 (two) times daily as needed. 300 mL 0   ezetimibe  (ZETIA ) 10 MG tablet Take 1 tablet (10 mg total) by mouth daily. 90 tablet 3   Ferrous Sulfate (IRON) 325 (65 Fe) MG TABS Take 1 tablet by mouth. Taking three times a week     sacubitril-valsartan (ENTRESTO ) 24-26 MG TAKE 1 TABLET BY MOUTH TWICE A DAY 180 tablet 0   levothyroxine  (SYNTHROID ) 50 MCG tablet AS DIRECTED ORALLY ONCE A DAY FOR 6 DAYS AND SKIP ON SUNDAYS 90 DAYS 90 tablet 3   montelukast  (SINGULAIR ) 10 MG tablet TAKE 1 TABLET BY MOUTH EVERYDAY AT BEDTIME (Patient not taking: Reported on 06/30/2024) 90 tablet 2   Turmeric 500 MG CAPS Take 1 capsule by mouth at bedtime. (Patient not taking: Reported on 06/30/2024)     No facility-administered medications prior to visit.    ROS: Review of Systems  Constitutional:  Negative for appetite change, fatigue and unexpected weight change.  HENT:  Negative for congestion, nosebleeds, sneezing, sore throat and trouble swallowing.   Eyes:  Negative for itching and visual  disturbance.  Respiratory:  Negative for cough.   Cardiovascular:  Negative for chest pain, palpitations and leg swelling.  Gastrointestinal:  Negative for abdominal distention, blood in stool, diarrhea and nausea.  Genitourinary:  Negative for frequency and hematuria.  Musculoskeletal:  Positive for arthralgias and back pain. Negative for gait problem, joint swelling and neck pain.  Skin:  Negative for rash.  Neurological:  Negative for dizziness, tremors, speech difficulty and weakness.  Psychiatric/Behavioral:  Negative for agitation, dysphoric mood, sleep disturbance and suicidal ideas. The patient is not nervous/anxious.     Objective:  BP 108/66   Pulse (!) 45   Temp 97.9 F (36.6 C)   Ht 6' 1 (1.854 m)   Wt 181 lb (82.1 kg)   SpO2 99%   BMI 23.88 kg/m   BP Readings from Last 3 Encounters:  06/30/24 108/66  06/08/24 (!) 100/52  02/17/24 120/72    Wt Readings from Last 3 Encounters:  06/30/24 181 lb (82.1 kg)  06/16/24 182 lb (82.6 kg)  06/08/24 182 lb (82.6 kg)    Physical Exam Constitutional:      General: He is not in acute distress.    Appearance: He is well-developed.     Comments: NAD  Eyes:     Conjunctiva/sclera: Conjunctivae normal.     Pupils: Pupils are equal, round, and  reactive to light.  Neck:     Thyroid : No thyromegaly.     Vascular: No JVD.  Cardiovascular:     Rate and Rhythm: Normal rate and regular rhythm.     Heart sounds: Normal heart sounds. No murmur heard.    No friction rub. No gallop.  Pulmonary:     Effort: Pulmonary effort is normal. No respiratory distress.     Breath sounds: Normal breath sounds. No wheezing or rales.  Chest:     Chest wall: No tenderness.  Abdominal:     General: Bowel sounds are normal. There is no distension.     Palpations: Abdomen is soft. There is no mass.     Tenderness: There is no abdominal tenderness. There is no guarding or rebound.  Musculoskeletal:        General: No tenderness. Normal range  of motion.     Cervical back: Normal range of motion.     Right lower leg: No edema.     Left lower leg: No edema.  Lymphadenopathy:     Cervical: No cervical adenopathy.  Skin:    General: Skin is warm and dry.     Findings: No rash.  Neurological:     Mental Status: He is alert and oriented to person, place, and time.     Cranial Nerves: No cranial nerve deficit.     Motor: No abnormal muscle tone.     Coordination: Coordination normal.     Gait: Gait normal.     Deep Tendon Reflexes: Reflexes are normal and symmetric.  Psychiatric:        Behavior: Behavior normal.        Thought Content: Thought content normal.        Judgment: Judgment normal.   Rectal was deffered  Lab Results  Component Value Date   WBC 4.6 06/08/2024   HGB 13.3 06/08/2024   HCT 41.0 06/08/2024   PLT 187 06/08/2024   GLUCOSE 93 06/08/2024   CHOL 203 (H) 06/08/2024   TRIG 253 (H) 06/08/2024   HDL 44 06/08/2024   LDLDIRECT 106 (H) 12/21/2021   LDLCALC 115 (H) 06/08/2024   ALT 20 06/08/2024   AST 22 06/08/2024   NA 139 06/08/2024   K 4.7 06/08/2024   CL 104 06/08/2024   CREATININE 1.18 06/08/2024   BUN 21 06/08/2024   CO2 21 06/08/2024   TSH 3.500 06/08/2024   PSA 4.12 (H) 06/30/2024   INR 1.0 08/21/2021   HGBA1C 5.6 10/11/2016    DG INJECT DIAG/THERA/INC NEEDLE/CATH/PLC EPI/LUMB/SAC W/IMG Result Date: 01/03/2024 CLINICAL DATA:  Lumbosacral spondylosis without myelopathy. Left low back and left groin pain. Epidural injection requested at L3-4. FLUOROSCOPY: Radiation Exposure Index (as provided by the fluoroscopic device): 2.0 mGy Kerma PROCEDURE: The procedure, risks, benefits, and alternatives were explained to the patient. Questions regarding the procedure were encouraged and answered. The patient understands and consents to the procedure. LUMBAR EPIDURAL INJECTION: An interlaminar approach was performed on the left at L3-4. The overlying skin was cleansed and anesthetized. A 3.5 inch 20 gauge  epidural needle was advanced using loss-of-resistance technique. DIAGNOSTIC EPIDURAL INJECTION Injection of Isovue -M 200 shows a good epidural pattern with spread above and below the level of needle placement primarily on the left. No vascular opacification is seen. THERAPEUTIC EPIDURAL INJECTION: 80 mg of Depo-Medrol  mixed with 3 mL of 1% lidocaine  were instilled. The procedure was well-tolerated, and the patient was discharged thirty minutes following the injection in good condition. COMPLICATIONS:  None immediate IMPRESSION: Technically successful interlaminar epidural injection on the left at L3-4. Electronically Signed   By: Dasie Hamburg M.D.   On: 01/03/2024 13:03    Assessment & Plan:   Problem List Items Addressed This Visit     Coronary artery disease   Follow-up with Dr. Tobie.  S/p DES to the mid LAD 2017,Status post CABG 2018 (LIMA-LAD, SVG-D1),Patent foramen ovale status post surgical closure       Elevated PSA   Follow-up with urology      Essential (primary) hypertension   On Farxiga , Entresto       Hypothyroidism   On Levothyroxine  Monitor TSH      Lumbar degenerative disc disease   Chronic low back pain.  Using treatments as needed      TIA (transient ischemic attack)   No relapse.      Well adult exam - Primary    We discussed age appropriate health related issues, including available/recomended screening tests and vaccinations. Labs were ordered to be later reviewed . All questions were answered. We discussed one or more of the following - seat belt use, use of sunscreen/sun exposure exercise, fall risk reduction, second hand smoke exposure, firearm use and storage, seat belt use, a need for adhering to healthy diet and exercise. Labs were ordered.  All questions were answered.         Relevant Orders   PSA (Completed)   Urinalysis (Completed)   Other Visit Diagnoses       Screening for colon cancer       Relevant Orders   Cologuard         No  orders of the defined types were placed in this encounter.     Follow-up: Return in about 6 months (around 12/29/2024) for a follow-up visit.  Marolyn Noel, MD

## 2024-07-02 ENCOUNTER — Ambulatory Visit: Payer: Self-pay | Admitting: Internal Medicine

## 2024-07-03 ENCOUNTER — Other Ambulatory Visit: Payer: Self-pay | Admitting: Internal Medicine

## 2024-07-03 ENCOUNTER — Other Ambulatory Visit: Payer: Self-pay | Admitting: Cardiovascular Disease

## 2024-07-06 NOTE — Assessment & Plan Note (Signed)
 Follow up with urology

## 2024-07-06 NOTE — Assessment & Plan Note (Signed)
On Levothyroxine Monitor TSH

## 2024-07-06 NOTE — Assessment & Plan Note (Signed)
 Chronic low back pain.  Using treatments as needed

## 2024-07-06 NOTE — Assessment & Plan Note (Signed)
 On Farxiga , Entresto 

## 2024-07-06 NOTE — Assessment & Plan Note (Signed)
No relapse 

## 2024-07-06 NOTE — Assessment & Plan Note (Signed)
 Follow-up with Dr. Tobie.  S/p DES to the mid LAD 2017,Status post CABG 2018 (LIMA-LAD, SVG-D1),Patent foramen ovale status post surgical closure

## 2024-07-07 DIAGNOSIS — Z1211 Encounter for screening for malignant neoplasm of colon: Secondary | ICD-10-CM | POA: Diagnosis not present

## 2024-07-08 ENCOUNTER — Encounter: Payer: Self-pay | Admitting: Family Medicine

## 2024-07-09 ENCOUNTER — Other Ambulatory Visit: Payer: Self-pay

## 2024-07-09 DIAGNOSIS — M461 Sacroiliitis, not elsewhere classified: Secondary | ICD-10-CM

## 2024-07-09 DIAGNOSIS — S76312A Strain of muscle, fascia and tendon of the posterior muscle group at thigh level, left thigh, initial encounter: Secondary | ICD-10-CM

## 2024-07-13 LAB — COLOGUARD: COLOGUARD: NEGATIVE

## 2024-07-14 ENCOUNTER — Ambulatory Visit (HOSPITAL_COMMUNITY)
Admission: RE | Admit: 2024-07-14 | Discharge: 2024-07-14 | Disposition: A | Discharge status: Home / Self Care | Source: Ambulatory Visit | Attending: Cardiology | Admitting: Cardiology

## 2024-07-14 DIAGNOSIS — I255 Ischemic cardiomyopathy: Secondary | ICD-10-CM | POA: Diagnosis not present

## 2024-07-14 LAB — ECHOCARDIOGRAM COMPLETE
Area-P 1/2: 2.48 cm2
P 1/2 time: 789 ms
S' Lateral: 2.9 cm

## 2024-07-16 NOTE — Progress Notes (Signed)
Patient has been notified directly; all questions, if any, were answered. Patient voiced understanding.   

## 2024-07-20 DIAGNOSIS — M79604 Pain in right leg: Secondary | ICD-10-CM | POA: Diagnosis not present

## 2024-07-20 DIAGNOSIS — M5186 Other intervertebral disc disorders, lumbar region: Secondary | ICD-10-CM | POA: Diagnosis not present

## 2024-07-28 DIAGNOSIS — N5201 Erectile dysfunction due to arterial insufficiency: Secondary | ICD-10-CM | POA: Diagnosis not present

## 2024-07-28 DIAGNOSIS — M79604 Pain in right leg: Secondary | ICD-10-CM | POA: Diagnosis not present

## 2024-07-28 DIAGNOSIS — N5319 Other ejaculatory dysfunction: Secondary | ICD-10-CM | POA: Diagnosis not present

## 2024-07-28 DIAGNOSIS — M5186 Other intervertebral disc disorders, lumbar region: Secondary | ICD-10-CM | POA: Diagnosis not present

## 2024-09-14 ENCOUNTER — Other Ambulatory Visit: Payer: Self-pay | Admitting: Cardiovascular Disease

## 2024-10-02 ENCOUNTER — Encounter: Payer: Self-pay | Admitting: Nurse Practitioner

## 2024-10-02 ENCOUNTER — Telehealth: Admitting: Nurse Practitioner

## 2024-10-02 DIAGNOSIS — J40 Bronchitis, not specified as acute or chronic: Secondary | ICD-10-CM | POA: Diagnosis not present

## 2024-10-02 MED ORDER — ALBUTEROL SULFATE HFA 108 (90 BASE) MCG/ACT IN AERS: 2.0000 | INHALATION_SPRAY | Freq: Four times a day (QID) | RESPIRATORY_TRACT | 0 refills | Status: AC | PRN

## 2024-10-02 MED ORDER — FLUTICASONE PROPIONATE 50 MCG/ACT NA SUSP: 2.0000 | Freq: Every day | NASAL | 6 refills | Status: AC

## 2024-10-02 MED ORDER — PROMETHAZINE-DM 6.25-15 MG/5ML PO SYRP: 5.0000 mL | ORAL_SOLUTION | Freq: Three times a day (TID) | ORAL | 0 refills | Status: AC | PRN

## 2024-10-02 MED ORDER — DOXYCYCLINE HYCLATE 100 MG PO TABS: 100.0000 mg | ORAL_TABLET | Freq: Two times a day (BID) | ORAL | 0 refills | Status: AC

## 2024-10-02 MED ORDER — BENZONATATE 100 MG PO CAPS: 100.0000 mg | ORAL_CAPSULE | Freq: Two times a day (BID) | ORAL | 0 refills | Status: AC | PRN

## 2024-10-02 NOTE — Assessment & Plan Note (Signed)
 Bronchitis Acute bronchitis likely viral. Risk of secondary bacterial infection if symptoms worsen or are prolonged. - Prescribed doxycycline  for potential secondary bacterial infection. Advised to only take if symptoms start to worsen again.  - Prescribed dextromethorphan with promethazine  for nighttime cough. Advised to avoid combining with alcohol and to avoid driving after taking a dose.  - Prescribed benzonatate  for daytime cough. - Advised rest and hydration. - Recommended Tylenol  for headache. - Prescribed Flonase  for congestion. - Advised to monitor for worsening symptoms and contact provider if no improvement with antibiotics.  Asthma Wheezing during cough, no acute exacerbation. - Prescribed inhaler for chest tightness or wheezing, two puffs every eight hours as needed.

## 2024-10-02 NOTE — Progress Notes (Signed)
 "  Established Patient Office Visit  An audio/visual tele-health visit was completed today for this patient. I connected with  Richard Ellis on 10/02/24 utilizing audio/visual technology and verified that I am speaking with the correct person using two identifiers. The patient was located at their home, and I was located at the office of [facility] during the encounter. I discussed the limitations of evaluation and management by telemedicine. The patient expressed understanding and agreed to proceed.     Subjective   Patient ID: Richard Ellis, male    DOB: [birth year]  Age: 79 y.o. MRN: [id number]  Chief Complaint  Patient presents with   Cough    Discussed the use of AI scribe software for clinical note transcription with the patient, who gave verbal consent to proceed.  History of Present Illness Richard Ellis is a 79 year old male with history of asthma who presents with a worsening cough and respiratory symptoms following recent travel.  Cough and upper respiratory symptoms - Cough began on Saturday during return travel from [place] after a 12-hour car ride with a sick contact and shared lodging - Cough is intermittently productive with associated nasal congestion, headache, muscle aches.  - No fever, chest pain, palpitations, shortness of breath, nausea, vomiting, abdominal pain, or diarrhea  Recent infectious exposure and testing - Recent travel with exposure to a sick contact and shared lodging - Flu and COVID tests performed yesterday were negative - Received influenza vaccine in September      Review of Systems  Constitutional:  Negative for chills and fever.  HENT:  Positive for congestion, sinus pain and sore throat.   Respiratory:  Positive for cough, sputum production and wheezing. Negative for shortness of breath.   Cardiovascular:  Negative for chest pain and palpitations.  Gastrointestinal:  Negative for abdominal pain, diarrhea,  nausea and vomiting.  Musculoskeletal:  Positive for myalgias.  Neurological:  Positive for headaches.      Objective:     There were no vitals taken for this visit.   Physical Exam Comprehensive physical exam not completed today as office visit was conducted remotely.  Patient appears well on video, no evidence of acute respiratory distress but he does cough multiple times during the visit.  Patient was alert and oriented, and appeared to have appropriate judgment.   No results found for any visits on 10/02/24.    The ASCVD Risk score (Arnett DK, et al., 2019) failed to calculate for the following reasons:   Risk score cannot be calculated because patient has a medical history suggesting prior/existing ASCVD   * - Cholesterol units were assumed    Assessment & Plan:   Problem List Items Addressed This Visit       Respiratory   Bronchitis - Primary   Relevant Medications   doxycycline  (VIBRA -TABS) 100 MG tablet   promethazine -dextromethorphan (PROMETHAZINE -DM) 6.25-15 MG/5ML syrup   fluticasone  (FLONASE ) 50 MCG/ACT nasal spray   albuterol  (VENTOLIN  HFA) 108 (90 Base) MCG/ACT inhaler   benzonatate  (TESSALON ) 100 MG capsule   Assessment and Plan Assessment & Plan Bronchitis Acute bronchitis likely viral. Risk of secondary bacterial infection if symptoms worsen or are prolonged. - Prescribed doxycycline  for potential secondary bacterial infection. Advised to only take if symptoms start to worsen again.  - Prescribed dextromethorphan with promethazine  for nighttime cough. Advised to avoid combining with alcohol and to avoid driving after taking a dose.  - Prescribed benzonatate  for daytime cough. -  Advised rest and hydration. - Recommended Tylenol  for headache. - Prescribed Flonase  for congestion. - Advised to monitor for worsening symptoms and contact provider if no improvement with antibiotics.  Asthma Wheezing during cough, no acute exacerbation. - Prescribed  inhaler for chest tightness or wheezing, two puffs every eight hours as needed.    Return if symptoms worsen or fail to improve.    Lauraine FORBES Pereyra, NP  "

## 2024-10-04 ENCOUNTER — Other Ambulatory Visit: Payer: Self-pay | Admitting: Cardiovascular Disease

## 2024-10-04 DIAGNOSIS — E782 Mixed hyperlipidemia: Secondary | ICD-10-CM

## 2025-07-01 ENCOUNTER — Ambulatory Visit

## 2025-07-01 ENCOUNTER — Ambulatory Visit: Admitting: Internal Medicine
